# Patient Record
Sex: Female | Born: 1995 | Race: Black or African American | Hispanic: No | Marital: Single | Smoking: Current every day smoker
Health system: Southern US, Community
[De-identification: ages and names within clinical notes are randomized; demographics above are authoritative.]

## PROBLEM LIST (undated history)

## (undated) ENCOUNTER — Inpatient Hospital Stay (HOSPITAL_COMMUNITY): Payer: Self-pay

## (undated) DIAGNOSIS — M1990 Unspecified osteoarthritis, unspecified site: Secondary | ICD-10-CM

## (undated) DIAGNOSIS — F4310 Post-traumatic stress disorder, unspecified: Secondary | ICD-10-CM

## (undated) DIAGNOSIS — F329 Major depressive disorder, single episode, unspecified: Secondary | ICD-10-CM

## (undated) DIAGNOSIS — F209 Schizophrenia, unspecified: Secondary | ICD-10-CM

## (undated) DIAGNOSIS — R1115 Cyclical vomiting syndrome unrelated to migraine: Secondary | ICD-10-CM

## (undated) DIAGNOSIS — R519 Headache, unspecified: Secondary | ICD-10-CM

## (undated) DIAGNOSIS — J069 Acute upper respiratory infection, unspecified: Secondary | ICD-10-CM

## (undated) DIAGNOSIS — F3110 Bipolar disorder, current episode manic without psychotic features, unspecified: Secondary | ICD-10-CM

## (undated) DIAGNOSIS — R0600 Dyspnea, unspecified: Secondary | ICD-10-CM

## (undated) DIAGNOSIS — J45909 Unspecified asthma, uncomplicated: Secondary | ICD-10-CM

## (undated) DIAGNOSIS — F419 Anxiety disorder, unspecified: Secondary | ICD-10-CM

## (undated) DIAGNOSIS — F32A Depression, unspecified: Secondary | ICD-10-CM

## (undated) HISTORY — PX: TONSILLECTOMY: SUR1361

## (undated) HISTORY — DX: Unspecified osteoarthritis, unspecified site: M19.90

---
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
## 2013-01-08 ENCOUNTER — Emergency Department (HOSPITAL_COMMUNITY): Payer: BC Managed Care – PPO

## 2013-01-08 ENCOUNTER — Emergency Department (HOSPITAL_COMMUNITY)
Admission: EM | Admit: 2013-01-08 | Discharge: 2013-01-08 | Disposition: A | Discharge status: Home / Self Care | Payer: BC Managed Care – PPO | Attending: Emergency Medicine | Admitting: Emergency Medicine

## 2013-01-08 ENCOUNTER — Encounter (HOSPITAL_COMMUNITY): Payer: Self-pay | Admitting: Emergency Medicine

## 2013-01-08 DIAGNOSIS — Z3202 Encounter for pregnancy test, result negative: Secondary | ICD-10-CM | POA: Insufficient documentation

## 2013-01-08 DIAGNOSIS — B9789 Other viral agents as the cause of diseases classified elsewhere: Secondary | ICD-10-CM | POA: Insufficient documentation

## 2013-01-08 DIAGNOSIS — Z79899 Other long term (current) drug therapy: Secondary | ICD-10-CM | POA: Insufficient documentation

## 2013-01-08 DIAGNOSIS — R109 Unspecified abdominal pain: Secondary | ICD-10-CM | POA: Insufficient documentation

## 2013-01-08 DIAGNOSIS — M545 Low back pain, unspecified: Secondary | ICD-10-CM | POA: Insufficient documentation

## 2013-01-08 DIAGNOSIS — B349 Viral infection, unspecified: Secondary | ICD-10-CM

## 2013-01-08 DIAGNOSIS — R112 Nausea with vomiting, unspecified: Secondary | ICD-10-CM | POA: Insufficient documentation

## 2013-01-08 DIAGNOSIS — R6883 Chills (without fever): Secondary | ICD-10-CM | POA: Insufficient documentation

## 2013-01-08 LAB — COMPREHENSIVE METABOLIC PANEL
Alkaline Phosphatase: 52 U/L (ref 39–117)
BUN: 14 mg/dL (ref 6–23)
Calcium: 10.8 mg/dL — ABNORMAL HIGH (ref 8.4–10.5)
GFR calc Af Amer: >90 mL/min (ref >90)
GFR calc non Af Amer: >90 mL/min (ref >90)
Glucose, Bld: 97 mg/dL (ref 70–99)
Total Protein: 8.1 g/dL (ref 6.0–8.3)

## 2013-01-08 LAB — URINE MICROSCOPIC-ADD ON

## 2013-01-08 LAB — URINALYSIS, ROUTINE W REFLEX MICROSCOPIC
Glucose, UA: NEGATIVE mg/dL
Protein, ur: 100 mg/dL — AB
Specific Gravity, Urine: 1.041 — ABNORMAL HIGH (ref 1.005–1.030)
Urobilinogen, UA: 1 mg/dL (ref 0.0–1.0)
pH: 6 (ref 5.0–8.0)

## 2013-01-08 LAB — CBC WITH DIFFERENTIAL/PLATELET
Basophils Absolute: 0 10*3/uL (ref 0.0–0.1)
Hemoglobin: 16.3 g/dL — ABNORMAL HIGH (ref 12.0–15.0)
Lymphocytes Relative: 15 % (ref 12–46)
Lymphs Abs: 1.6 10*3/uL (ref 0.7–4.0)
MCHC: 35.1 g/dL (ref 30.0–36.0)
Neutrophils Relative %: 82 % — ABNORMAL HIGH (ref 43–77)
Platelets: 248 10*3/uL (ref 150–400)
RDW: 12.1 % (ref 11.5–15.5)

## 2013-01-08 LAB — LIPASE, BLOOD: Lipase: 15 U/L (ref 11–59)

## 2013-01-08 MED ORDER — PROMETHAZINE HCL 25 MG PO TABS: 25.0000 mg | ORAL_TABLET | Freq: Four times a day (QID) | ORAL | Status: DC | PRN

## 2013-01-08 MED ORDER — ONDANSETRON HCL 4 MG/2ML IJ SOLN
4.0000 mg | Freq: Once | INTRAMUSCULAR | Status: AC
  Administered 2013-01-08: 4 mg via INTRAVENOUS
  Filled 2013-01-08: qty 2

## 2013-01-08 MED ORDER — SODIUM CHLORIDE 0.9 % IV BOLUS (SEPSIS)
1000.0000 mL | Freq: Once | INTRAVENOUS | Status: AC
  Administered 2013-01-08: 1000 mL via INTRAVENOUS

## 2013-01-08 MED ORDER — POTASSIUM CHLORIDE CRYS ER 20 MEQ PO TBCR
40.0000 meq | EXTENDED_RELEASE_TABLET | Freq: Once | ORAL | Status: DC
  Filled 2013-01-08: qty 2

## 2013-01-08 MED ORDER — PROMETHAZINE HCL 25 MG/ML IJ SOLN
25.0000 mg | Freq: Once | INTRAMUSCULAR | Status: AC
  Administered 2013-01-08: 25 mg via INTRAVENOUS
  Filled 2013-01-08 (×2): qty 1

## 2013-01-08 MED ORDER — ONDANSETRON 4 MG PO TBDP
8.0000 mg | ORAL_TABLET | Freq: Once | ORAL | Status: AC
  Administered 2013-01-08: 8 mg via ORAL
  Filled 2013-01-08: qty 2

## 2013-01-08 MED ORDER — SODIUM CHLORIDE 0.9 % IV BOLUS (SEPSIS): 1000.0000 mL | Freq: Once | INTRAVENOUS | Status: DC

## 2013-01-08 NOTE — ED Notes (Signed)
Patient transported to CT 

## 2013-01-08 NOTE — ED Notes (Signed)
Pt c/o N/V since 0300 on Sunday am; pt admits to ETOH earlier in night on Saturday; pt sts vaginal spotting at present

## 2013-01-08 NOTE — ED Provider Notes (Signed)
History     CSN: [id number]  Arrival date & time 01/08/13  4098   First MD Initiated Contact with Patient 01/08/13 1124      Chief Complaint  Patient presents with  . Nausea  . Emesis    (Consider location/radiation/quality/duration/timing/severity/associated sxs/prior treatment) The history is provided by the patient and a parent. No language interpreter was used.  Angela Christian is an 17 y/o F presenting to the ED with nausea and emesis that has been ongoing since yesterday morning, around 4:00AM. Mother reported that patient was fine on Saturday - stated that around 4:00AM Sunday morning patient began to have emesis, stated more than two times - mainly of liquid consistency - negative NB/NB. Mother reported that every time patient drinks or eats something the food and liquid come right back up. Patient reported that has been having abdominal pain - mainly of a tightness sensation that has been ongoing for the past day, with mild radiation to the lower back. Stated that she tried taking peptobismol without success - reported that she vomited shortly after. Patient reported that she has been feeling nausea continuously since the early morning yesterday. Mother reported that patient ate [employer] on Saturday and was at a friend's cook out where for had some ETOH, a sip of wine. Associated symptoms are chills. Denied diarrhea, fever, numbness, tingling, headache, dizziness, visual disturbances, urinary symptoms.   History reviewed. No pertinent past medical history.  History reviewed. No pertinent past surgical history.  History reviewed. No pertinent family history.  History  Substance Use Topics  . Smoking status: Never Smoker   . Smokeless tobacco: Not on file  . Alcohol Use: Yes     Comment: occ    OB History   Grav Para Term Preterm Abortions TAB SAB Ect Mult Living                  Review of Systems  Constitutional: Positive for chills. Negative for fever.  HENT:  Negative for neck pain and neck stiffness.   Eyes: Negative for pain and visual disturbance.  Respiratory: Negative for cough, chest tightness and shortness of breath.   Cardiovascular: Negative for chest pain.  Gastrointestinal: Positive for nausea, vomiting and abdominal pain. Negative for diarrhea, constipation, blood in stool and anal bleeding.  Genitourinary: Negative for vaginal bleeding (spotting, baseline for patient since on Depo-provera), vaginal discharge and vaginal pain.  Musculoskeletal: Positive for back pain. Negative for arthralgias.  Neurological: Negative for dizziness, weakness, light-headedness, numbness and headaches.  All other systems reviewed and are negative.    Allergies  Review of patient's allergies indicates no known allergies.  Home Medications   Current Outpatient Rx  Name  Route  Sig  Dispense  Refill  . bismuth subsalicylate (PEPTO BISMOL) 262 MG/15ML suspension   Oral   Take 30 mLs by mouth every 6 (six) hours as needed for indigestion.         . medroxyPROGESTERone (DEPO-PROVERA) 150 MG/ML injection   Intramuscular   Inject 150 mg into the muscle every 3 (three) months.         . promethazine (PHENERGAN) 25 MG tablet   Oral   Take 1 tablet (25 mg total) by mouth every 6 (six) hours as needed for nausea.   30 tablet   0     BP 123/92  Pulse 99  Temp(Src) 98.3 F (36.8 C) (Oral)  Resp 20  SpO2 100%  LMP 01/08/2013  Physical Exam  Nursing note  and vitals reviewed. Constitutional: She is oriented to person, place, and time. She appears well-developed and well-nourished. No distress.  HENT:  Head: Normocephalic and atraumatic.  Mucus membranes appear dry  Eyes: Conjunctivae and EOM are normal. Pupils are equal, round, and reactive to light. Right eye exhibits no discharge. Left eye exhibits no discharge.  Neck: Normal range of motion. Neck supple.  Cardiovascular: Normal rate, regular rhythm and normal heart sounds.  Exam reveals  no friction rub.   No murmur heard. Pulses:      Radial pulses are 2+ on the right side, and 2+ on the left side.       Dorsalis pedis pulses are 2+ on the right side, and 2+ on the left side.  Cap refill < 3 seconds  Pulmonary/Chest: Effort normal and breath sounds normal. No respiratory distress. She has no wheezes. She has no rales.  Abdominal: Soft. Normal appearance and bowel sounds are normal. She exhibits no distension. There is no hepatosplenomegaly. There is no tenderness. There is no rigidity, no rebound, no guarding, no tenderness at McBurney's point and negative Murphy's sign. No hernia.  Negative Murphy's sign Negative obtruator Negative psoas  Musculoskeletal: Normal range of motion.  Lymphadenopathy:    She has no cervical adenopathy.  Neurological: She is alert and oriented to person, place, and time. No cranial nerve deficit. She exhibits normal muscle tone. Coordination normal.  Cranial nerves III-XII grossly intact  Skin: Skin is warm and dry. No rash noted. She is not diaphoretic. No erythema.  Psychiatric: She has a normal mood and affect. Her behavior is normal. Thought content normal.    ED Course  Procedures (including critical care time)  3:30PM PO challenge with fluid to be trialed.  4:35PM Patient re-assessed. Stated that she is doing well and feels better. Tolerated PO fluid challenge. Patient ready for discharge.  4:43PM Patient vomited - mainly of clear fluids, stomach contents.   6:29PM Nurse reported that patient continues to feel nausea - Reglan IV 25 mg ordered.   7:10PM Discussed findings with patient regarding CT scan - negative findings. Patient reported that she is feeling better, stated that she is tired and ready to go home. Patient stated that nausea is controlled and that she is feeling better.   Medications  sodium chloride 0.9 % bolus 1,000 mL (not administered)  ondansetron (ZOFRAN-ODT) disintegrating tablet 8 mg (8 mg Oral Given 01/08/13  1026)  sodium chloride 0.9 % bolus 1,000 mL (0 mLs Intravenous Stopped 01/08/13 1520)  ondansetron (ZOFRAN) injection 4 mg (4 mg Intravenous Given 01/08/13 1239)  ondansetron (ZOFRAN) injection 4 mg (4 mg Intravenous Given 01/08/13 1400)  sodium chloride 0.9 % bolus 1,000 mL (0 mLs Intravenous Stopped 01/08/13 1924)  ondansetron (ZOFRAN) injection 4 mg (4 mg Intravenous Given 01/08/13 1731)  promethazine (PHENERGAN) injection 25 mg (25 mg Intravenous Given 01/08/13 1846)    Labs Reviewed  URINALYSIS, ROUTINE W REFLEX MICROSCOPIC - Abnormal; Notable for the following:    Color, Urine AMBER (*)    APPearance HAZY (*)    Specific Gravity, Urine 1.041 (*)    Hgb urine dipstick MODERATE (*)    Bilirubin Urine SMALL (*)    Ketones, ur 40 (*)    Protein, ur 100 (*)    Leukocytes, UA TRACE (*)    All other components within normal limits  CBC WITH DIFFERENTIAL - Abnormal; Notable for the following:    WBC 10.7 (*)    Hemoglobin 16.3 (*)  HCT 46.4 (*)    Neutrophils Relative % 82 (*)    Neutro Abs 8.7 (*)    All other components within normal limits  COMPREHENSIVE METABOLIC PANEL - Abnormal; Notable for the following:    Potassium 3.4 (*)    Calcium 10.8 (*)    All other components within normal limits  URINE MICROSCOPIC-ADD ON - Abnormal; Notable for the following:    Crystals CA OXALATE CRYSTALS (*)    All other components within normal limits  LIPASE, BLOOD  POCT PREGNANCY, URINE   Ct Abdomen Pelvis Wo Contrast  01/08/2013   *RADIOLOGY REPORT*  Clinical Data: Nausea, vomiting, vaginal spotting  CT ABDOMEN AND PELVIS WITHOUT CONTRAST  Technique:  Multidetector CT imaging of the abdomen and pelvis was performed following the standard protocol without intravenous contrast.  Comparison: Abdomen films from today  Findings: The lung bases are clear.  The liver is unremarkable in the unenhanced state.  No definite gallstones are noted.  The pancreas is difficult to assess on this unenhanced  study but no definite mass or peripancreatic fluid is seen.  The adrenal glands and spleen are unremarkable.  The stomach is not well distended. No definite renal calculi are seen and there is no evidence of hydronephrosis.  The abdominal aorta is normal in caliber.  The uterus is slightly anteverted.  Probable small ovarian follicles are present bilaterally, and no significant free fluid is evident on this unenhanced study.  The appendix fills with air.  No inflammatory process is seen.  No bony abnormality is noted.  IMPRESSION: No significant abnormality on this unenhanced CT of the abdomen and pelvis.   Original Report Authenticated By: Dwyane Dee, M.D.   Dg Abd Acute W/chest  01/08/2013   *RADIOLOGY REPORT*  Clinical Data: Abdominal pain.  Nausea and vomiting.  ACUTE ABDOMEN SERIES (ABDOMEN 2 VIEW & CHEST 1 VIEW)  Comparison: None.  Findings: Bowel gas pattern is normal without evidence of ileus, obstruction nor free air.  No worrisome calcifications or bony findings.  One-view chest shows normal heart and mediastinal shadows.  Lungs are clear.  No effusions.  IMPRESSION: Normal acute abdominal series   Original Report Authenticated By: Paulina Fusi, M.D.     1. Nausea and vomiting   2. Viral illness       MDM  Patient presenting with nausea and emesis that has been ongoing for the past 2 days. Negative abdominal pain.  PCP Dr. Kevan Ny [facility].  Negative urine pregnancy. Negative UA findings - patient dehydrated, calcium oxalate crystals - IV NS fluids given. CBC and CMP negative findings - mildly low potassium (3.4). Lipase negative. Negative findings on abdomen and chest xray - negative obstruction or ileus noted - negative free air. CT scan of abdomen to rule out kidney stones - hematuria and possible renal colic. CT abdomen - negative gallstones noted, negative peripancreatic fluid noted, negative evidence of nephrolithiasis/renal calculi, negative hydronephrosis noted. Patient  stable, afebrile. Discussed case with Dr. Leeann Must - patient cleared for discharge. Suspicion to be viral in nature, definitive etiology unknown. Patient discharged with antiemetics. Referred patient to PCP, Dr. Kevan Ny, and gastroenterologist. Discussed with patient diet and to rest and stay hydrated. Discussed with patient to monitor symptoms and if symptoms are to worsen or change to report back to the ED - strict return instructions given - discussed that if patient continues to have nausea and emesis by Thursday of this week to return back to the ED.  Patient agreed  to plan of care, understood, all questions answered.             Raymon Mutton, PA-C 01/08/13 2130

## 2013-01-10 NOTE — ED Provider Notes (Signed)
Medical screening examination/treatment/procedure(s) were conducted as a shared visit with non-physician practitioner(s) and myself.  I personally evaluated the patient during the encounter Pt c/o nv. Emesis not bloody or bilious. No abd tenderness on exam. No fevers. No headache. Iv fluids, labs, zofran.   Suzi Roots, MD 01/10/13 1556

## 2013-01-12 ENCOUNTER — Observation Stay (HOSPITAL_COMMUNITY)
Admission: EM | Admit: 2013-01-12 | Discharge: 2013-01-14 | Disposition: A | Discharge status: Home / Self Care | Payer: BC Managed Care – PPO | Attending: Internal Medicine | Admitting: Internal Medicine

## 2013-01-12 ENCOUNTER — Encounter (HOSPITAL_COMMUNITY): Payer: Self-pay | Admitting: *Deleted

## 2013-01-12 DIAGNOSIS — R1115 Cyclical vomiting syndrome unrelated to migraine: Principal | ICD-10-CM | POA: Insufficient documentation

## 2013-01-12 DIAGNOSIS — F1210 Cannabis abuse, uncomplicated: Secondary | ICD-10-CM | POA: Insufficient documentation

## 2013-01-12 DIAGNOSIS — R112 Nausea with vomiting, unspecified: Secondary | ICD-10-CM | POA: Diagnosis present

## 2013-01-12 DIAGNOSIS — E860 Dehydration: Secondary | ICD-10-CM

## 2013-01-12 DIAGNOSIS — E43 Unspecified severe protein-calorie malnutrition: Secondary | ICD-10-CM | POA: Insufficient documentation

## 2013-01-12 LAB — COMPREHENSIVE METABOLIC PANEL
ALT: 13 U/L (ref 0–35)
AST: 25 U/L (ref 0–37)
Albumin: 3.9 g/dL (ref 3.5–5.2)
Calcium: 8.9 mg/dL (ref 8.4–10.5)
Creatinine, Ser: 0.84 mg/dL (ref 0.50–1.10)
Sodium: 140 mEq/L (ref 135–145)
Total Protein: 6.8 g/dL (ref 6.0–8.3)

## 2013-01-12 LAB — URINALYSIS, ROUTINE W REFLEX MICROSCOPIC
Glucose, UA: NEGATIVE mg/dL
Leukocytes, UA: NEGATIVE
Protein, ur: NEGATIVE mg/dL
Specific Gravity, Urine: 1.029 (ref 1.005–1.030)

## 2013-01-12 LAB — CBC WITH DIFFERENTIAL/PLATELET
Basophils Absolute: 0 10*3/uL (ref 0.0–0.1)
Basophils Relative: 0 % (ref 0–1)
Eosinophils Absolute: 0 10*3/uL (ref 0.0–0.7)
Eosinophils Relative: 0 % (ref 0–5)
Lymphocytes Relative: 9 % — ABNORMAL LOW (ref 12–46)
MCH: 33.3 pg (ref 26.0–34.0)
MCHC: 36 g/dL (ref 30.0–36.0)
MCV: 92.5 fL (ref 78.0–100.0)
Monocytes Absolute: 0.2 10*3/uL (ref 0.1–1.0)
Platelets: 230 10*3/uL (ref 150–400)
RDW: 11.6 % (ref 11.5–15.5)
WBC: 8.4 10*3/uL (ref 4.0–10.5)

## 2013-01-12 LAB — WET PREP, GENITAL
Clue Cells Wet Prep HPF POC: NONE SEEN
Trich, Wet Prep: NONE SEEN
WBC, Wet Prep HPF POC: NONE SEEN

## 2013-01-12 LAB — URINE MICROSCOPIC-ADD ON

## 2013-01-12 LAB — CG4 I-STAT (LACTIC ACID): Lactic Acid, Venous: 1.18 mmol/L (ref 0.5–2.2)

## 2013-01-12 MED ORDER — MORPHINE SULFATE 4 MG/ML IJ SOLN
4.0000 mg | Freq: Once | INTRAMUSCULAR | Status: AC
  Administered 2013-01-12: 4 mg via INTRAVENOUS
  Filled 2013-01-12: qty 1

## 2013-01-12 MED ORDER — ONDANSETRON HCL 4 MG/2ML IJ SOLN
4.0000 mg | Freq: Once | INTRAMUSCULAR | Status: AC
  Administered 2013-01-12: 4 mg via INTRAVENOUS
  Filled 2013-01-12: qty 2

## 2013-01-12 MED ORDER — DEXTROSE-NACL 5-0.9 % IV SOLN
Freq: Once | INTRAVENOUS | Status: AC
  Administered 2013-01-12: via INTRAVENOUS

## 2013-01-12 MED ORDER — SODIUM CHLORIDE 0.9 % IV BOLUS (SEPSIS)
1000.0000 mL | Freq: Once | INTRAVENOUS | Status: AC
Start: 2013-01-12 — End: 2013-01-12
  Administered 2013-01-12: 1000 mL via INTRAVENOUS

## 2013-01-12 NOTE — ED Provider Notes (Signed)
History    CSN: [id number] Arrival date & time 01/12/13  1629  First MD Initiated Contact with Patient 01/12/13 1819     Chief Complaint  Patient presents with  . Emesis   (Consider location/radiation/quality/duration/timing/severity/associated sxs/prior Treatment) HPI Comments: Patient is an 17 year old female who presents with ongoing abdominal pain, nausea, vomiting. She was seen in the emergency department 4 days ago for the same. At that time she was worked up with labs, x-ray, CT. There is been was unremarkable at that time. Her mother reports that she gradually got better each day and was able to eat some rice yesterday. This morning she woke up with worsening nausea and vomiting. She has not been able to tolerate any liquids or solids today. She complains of pain in her suprapubic area with radiation to her right and left lower quadrant. She has not had a regular bowel movement in the past week, but has not been eating much. She denies any urinary urgency, painful urination, hematuria. She is currently on her period. Her menstrual cycle is irregular as she is on Depo. She denies vaginal discharge, new sexual partners.  The history is provided by the patient. No language interpreter was used.   History reviewed. No pertinent past medical history. History reviewed. No pertinent past surgical history. History reviewed. No pertinent family history. History  Substance Use Topics  . Smoking status: Never Smoker   . Smokeless tobacco: Not on file  . Alcohol Use: Yes     Comment: occ   OB History   Grav Para Term Preterm Abortions TAB SAB Ect Mult Living                 Review of Systems  Constitutional: Negative for fever and chills.  Respiratory: Negative for shortness of breath.   Gastrointestinal: Positive for nausea, vomiting and abdominal pain. Negative for diarrhea and constipation.  Genitourinary: Positive for vaginal bleeding. Negative for dysuria, urgency, frequency,  vaginal discharge and vaginal pain.  All other systems reviewed and are negative.    Allergies  Review of patient's allergies indicates no known allergies.  Home Medications   Current Outpatient Rx  Name  Route  Sig  Dispense  Refill  . bismuth subsalicylate (PEPTO BISMOL) 262 MG/15ML suspension   Oral   Take 30 mLs by mouth every 6 (six) hours as needed for indigestion.         . medroxyPROGESTERone (DEPO-PROVERA) 150 MG/ML injection   Intramuscular   Inject 150 mg into the muscle every 3 (three) months.         . promethazine (PHENERGAN) 25 MG tablet   Oral   Take 1 tablet (25 mg total) by mouth every 6 (six) hours as needed for nausea.   30 tablet   0    BP 118/87  Pulse 82  Temp(Src) 99.3 F (37.4 C) (Oral)  Resp 16  SpO2 99%  LMP 01/08/2013 Physical Exam  Nursing note and vitals reviewed. Constitutional: She is oriented to person, place, and time. She appears well-developed and well-nourished. No distress.  HENT:  Head: Normocephalic and atraumatic.  Right Ear: External ear normal.  Left Ear: External ear normal.  Nose: Nose normal.  Mouth/Throat: Oropharynx is clear and moist.  Eyes: Conjunctivae are normal.  Neck: Normal range of motion.  Cardiovascular: Normal rate, regular rhythm and normal heart sounds.   Pulmonary/Chest: Effort normal and breath sounds normal. No stridor. No respiratory distress. She has no wheezes. She has no rales.  Abdominal: Soft. She exhibits no distension. There is tenderness in the suprapubic area. There is no rigidity, no rebound, no guarding, no tenderness at McBurney's point and negative Murphy's sign. Hernia confirmed negative in the right inguinal area and confirmed negative in the left inguinal area.  Genitourinary: Pelvic exam was performed with patient supine. Cervix exhibits discharge (blood). Cervix exhibits no motion tenderness and no friability. Right adnexum displays no mass, no tenderness and no fullness. Left  adnexum displays no mass, no tenderness and no fullness. No erythema or tenderness around the vagina. No foreign body around the vagina. No signs of injury around the vagina.  Musculoskeletal: Normal range of motion.  Lymphadenopathy:       Right: No inguinal adenopathy present.       Left: No inguinal adenopathy present.  Neurological: She is alert and oriented to person, place, and time. She has normal strength.  Skin: Skin is warm and dry. She is not diaphoretic. No erythema.  Psychiatric: She has a normal mood and affect. Her behavior is normal.    ED Course  Procedures (including critical care time) Labs Reviewed  CBC WITH DIFFERENTIAL - Abnormal; Notable for the following:    Neutrophils Relative % 89 (*)    Lymphocytes Relative 9 (*)    Monocytes Relative 2 (*)    All other components within normal limits  URINALYSIS, ROUTINE W REFLEX MICROSCOPIC - Abnormal; Notable for the following:    Hgb urine dipstick LARGE (*)    Bilirubin Urine SMALL (*)    Ketones, ur >80 (*)    All other components within normal limits  WET PREP, GENITAL  GC/CHLAMYDIA PROBE AMP  COMPREHENSIVE METABOLIC PANEL  LIPASE, BLOOD  URINE MICROSCOPIC-ADD ON  URINE RAPID DRUG SCREEN (HOSP PERFORMED)  CG4 I-STAT (LACTIC ACID)    No results found.   1. Dehydration   2. Nausea & vomiting     MDM  Patient with continued dehydration, nausea, and vomiting. Abdomen soft, nonsurgical. Discussed case with Dr. Julian Reil who agrees to admit. Added UDS and PRN nausea medications.   Mora Bellman, PA-C 01/13/13 0221

## 2013-01-12 NOTE — ED Notes (Signed)
Pt in via EMS, pt in c/o vomiting x5 days, also abd pain, seen here a few days ago, given phenergan to go home with, no relief. Vitals WNL.

## 2013-01-13 ENCOUNTER — Observation Stay (HOSPITAL_COMMUNITY): Payer: BC Managed Care – PPO

## 2013-01-13 ENCOUNTER — Encounter (HOSPITAL_COMMUNITY): Payer: Self-pay | Admitting: General Practice

## 2013-01-13 DIAGNOSIS — R112 Nausea with vomiting, unspecified: Secondary | ICD-10-CM | POA: Diagnosis present

## 2013-01-13 DIAGNOSIS — E860 Dehydration: Secondary | ICD-10-CM

## 2013-01-13 MED ORDER — BOOST / RESOURCE BREEZE PO LIQD: 1.0000 | Freq: Three times a day (TID) | ORAL | Status: DC

## 2013-01-13 MED ORDER — BISMUTH SUBSALICYLATE 262 MG/15ML PO SUSP
30.0000 mL | Freq: Four times a day (QID) | ORAL | Status: DC | PRN
  Administered 2013-01-13 – 2013-01-14 (×2): 30 mL via ORAL
  Filled 2013-01-13: qty 236

## 2013-01-13 MED ORDER — ENSURE COMPLETE PO LIQD
237.0000 mL | Freq: Three times a day (TID) | ORAL | Status: DC
  Administered 2013-01-13 – 2013-01-14 (×3): 237 mL via ORAL

## 2013-01-13 MED ORDER — DEXTROSE-NACL 5-0.9 % IV SOLN
Freq: Once | INTRAVENOUS | Status: AC
  Administered 2013-01-13: 05:00:00 via INTRAVENOUS

## 2013-01-13 MED ORDER — PROMETHAZINE HCL 25 MG PO TABS: 25.0000 mg | ORAL_TABLET | Freq: Four times a day (QID) | ORAL | Status: DC | PRN

## 2013-01-13 MED ORDER — ONDANSETRON HCL 4 MG/2ML IJ SOLN
4.0000 mg | Freq: Every day | INTRAMUSCULAR | Status: DC | PRN
  Administered 2013-01-13: 4 mg via INTRAVENOUS
  Filled 2013-01-13: qty 2

## 2013-01-13 MED ORDER — SODIUM CHLORIDE 0.9 % IV SOLN
INTRAVENOUS | Status: DC
  Administered 2013-01-13: 23:00:00 via INTRAVENOUS

## 2013-01-13 NOTE — Care Management Utilization Note (Signed)
Utilization review completed.  

## 2013-01-13 NOTE — Progress Notes (Addendum)
[facility] PROGRESS NOTE  Angela Christian ZOX:[id number] DOB: [birth year] DOA: 01/12/2013 PCP: Hollice Espy, MD I have seen and examined pt who is an 17 yo admitted this am by Dr Julian Reil with  persistent nausea and vomiting for about 1wk. She admits to marijuana use. Pt tolerating clears, advance as tol. Continue current management plan as per Dr Julian Reil and follow. Pt counseled to quit marijuana, soc cons for resources to quit     [facility] C  [facility] Pager [phone]. If 7PM-7AM, please contact night-coverage at [email or web address], password [facility] 01/13/2013, 2:12 PM  LOS: 1 day

## 2013-01-13 NOTE — H&P (Signed)
[facility] History and Physical  Angela Christian:[id number] DOB: 14-Feb-1996 DOA: 01/12/2013  Referring physician: ED PCP: Hollice Espy, MD   Chief Complaint: N/V  HPI: Angela Christian is a 17 y.o. female previously healthy who presents to the ED with ongoing nausea and vomiting since Sunday.  Patient had been seen in the ED for the same on 6/23 and discharged on phenergan.  Her symptoms had been improving until today when she woke up with them severely worse.  No sick contacts and no one around her has become sick.  Work up in the ED was unremarkable as it was on the 23rd, hospitalist asked to admit for symptom management.  Review of Systems: No fever, no chills, no diarrhea (actually states she hasnt had a BM except once this week), no abdominal pain, 12 systems reviewed and otherwise negative.  History reviewed. No pertinent past medical history. History reviewed. No pertinent past surgical history. Social History:  reports that she has never smoked. She does not have any smokeless tobacco history on file. She reports that  drinks alcohol. She reports that she uses illicit drugs. Uses Mariajuana daily.  No Known Allergies  History reviewed. No pertinent family history.  Prior to Admission medications   Medication Sig Start Date End Date Taking? Authorizing Provider  bismuth subsalicylate (PEPTO BISMOL) 262 MG/15ML suspension Take 30 mLs by mouth every 6 (six) hours as needed for indigestion.   Yes Historical Provider, MD  medroxyPROGESTERone (DEPO-PROVERA) 150 MG/ML injection Inject 150 mg into the muscle every 3 (three) months.   Yes Historical Provider, MD  promethazine (PHENERGAN) 25 MG tablet Take 1 tablet (25 mg total) by mouth every 6 (six) hours as needed for nausea. 01/08/13  Yes Marissa Sciacca, PA-C   Physical Exam: Filed Vitals:   01/12/13 2200 01/12/13 2219 01/13/13 0300 01/13/13 0425  BP: 98/52 98/52 106/49 100/56  Pulse: 74 75 60 60  Temp:   97.9 F  (36.6 C) 97.6 F (36.4 C)  TempSrc:   Oral Oral  Resp:  18 18 18   Height:   5' 4.2" (1.631 m)   Weight:   49.3 kg (108 lb 11 oz)   SpO2: 100% 98% 100% 99%    General:  NAD, resting comfortably in bed Eyes: PEERLA EOMI ENT: mucous membranes moist Neck: supple w/o JVD Cardiovascular: RRR w/o MRG Respiratory: CTA B Abdomen: soft, nt, nd, bs+ Skin: no rash nor lesion Musculoskeletal: MAE, full ROM all 4 extremities Psychiatric: normal tone and affect Neurologic: AAOx3, grossly non-focal  Labs on Admission:  Basic Metabolic Panel:  Recent Labs Lab 01/08/13 1035 01/12/13 2140  NA 141 140  K 3.4* 4.2  CL 105 106  CO2 21 21  GLUCOSE 97 83  BUN 14 14  CREATININE 0.90 0.84  CALCIUM 10.8* 8.9   Liver Function Tests:  Recent Labs Lab 01/08/13 1035 01/12/13 2140  AST 22 25  ALT 17 13  ALKPHOS 52 41  BILITOT 1.1 1.1  PROT 8.1 6.8  ALBUMIN 4.7 3.9    Recent Labs Lab 01/08/13 1035 01/12/13 2140  LIPASE 15 21   No results found for this basename: AMMONIA,  in the last 168 hours CBC:  Recent Labs Lab 01/08/13 1035 01/12/13 2140  WBC 10.7* 8.4  NEUTROABS 8.7* 7.5  HGB 16.3* 15.0  HCT 46.4* 41.7  MCV 94.5 92.5  PLT 248 230   Cardiac Enzymes: No results found for this basename: CKTOTAL, CKMB, CKMBINDEX, TROPONINI,  in the  last 168 hours  BNP (last 3 results) No results found for this basename: PROBNP,  in the last 8760 hours CBG: No results found for this basename: GLUCAP,  in the last 168 hours  Radiological Exams on Admission: No results found.  EKG: Independently reviewed.  Assessment/Plan Principal Problem:   Nausea and vomiting   1. N/V - ddx includes: 1. CVS - would be the first time this occurred but am suspicious given the fairly consistent use of cannabis by the patient.  As discussed with patient certainly cant make this diagnosis from just one episode but if her symptoms were to return in future then this should be  suspected. 2. Infectious - possible, but odd that the patient has been ill for nearly a week now with symptoms, no one else around her has been or has gotten sick despite close contact, and also unusual to see such severe N/V for such a duration without any diarrhea. 3. Obstruction - will check KUB but highly unlikely in a previously healthy 17 year old who has never had abdominal surgery.    Code Status: Full Code (must indicate code status--if unknown or must be presumed, indicate so) Family Communication: Mother is at bedside (indicate person spoken with, if applicable, with phone number if by telephone) Disposition Plan: Admit to inpatient (indicate anticipated LOS)  Time spent: 50 min  GARDNER, JARED M. [facility] Pager [phone]  If 7PM-7AM, please contact night-coverage [email or web address] Password [facility] 01/13/2013, 4:54 AM

## 2013-01-13 NOTE — ED Provider Notes (Signed)
Medical screening examination/treatment/procedure(s) were conducted as a shared visit with non-physician practitioner(s) and myself.  I personally evaluated the patient during the encounter Pt with mild suprapubic pain but continued vomiting.  Mild improvement but recent neg CT and pelvic neg.  UA with large ketones.  Gwyneth Sprout, MD 01/13/13 2317

## 2013-01-13 NOTE — Progress Notes (Addendum)
INITIAL NUTRITION ASSESSMENT  DOCUMENTATION CODES Per approved criteria  -Severe malnutrition in the context of acute illness or injury   INTERVENTION: Ensure Complete PO TID, each supplement provides 350 kcal and 13 grams of protein.  NUTRITION DIAGNOSIS: Malnutrition related to altered GI function with inadequate oral intake as evidenced by 5% weight loss in the past week and intake </= 50% of estimated energy needs for >/= 5 days.  Goal: Intake to meet >90% of estimated nutrition needs.  Monitor:  Diet advancement, PO intake, labs, weight trend.  Reason for Assessment: MST=2  17 y.o. female  Admitting Dx: Nausea and vomiting  ASSESSMENT: Patient admitted with a 5 day history of N/V. Says she has been eating minimal amounts since last Sunday. Is currently drinking tea in room. Requesting oatmeal. Does not like broth. Diet just advanced to regular; patient does not like Breeze, but does like Ensure.  Pt meets criteria for severe MALNUTRITION in the context of acute illness as evidenced by 5% weight loss in the past week and intake </= 50% of estimated energy needs for >/= 5 days.   Height: Ht Readings from Last 1 Encounters:  01/13/13 5' 4.2" (1.631 m) (49%*, Z = -0.02)   * Growth percentiles are based on CDC 2-20 Years data.    Weight: Wt Readings from Last 1 Encounters:  01/13/13 108 lb 11 oz (49.3 kg) (15%*, Z = -1.05)   * Growth percentiles are based on CDC 2-20 Years data.    Ideal Body Weight: 55.7 kg  % Ideal Body Weight: 89%  Wt Readings from Last 10 Encounters:  01/13/13 108 lb 11 oz (49.3 kg) (15%*, Z = -1.05)   * Growth percentiles are based on CDC 2-20 Years data.    Usual Body Weight: 114 lb  % Usual Body Weight: 95%  BMI:  Body mass index is 18.53 kg/(m^2).  Estimated Nutritional Needs: Kcal: 2000 Protein: 80-100 gm Fluid: 2 L  Skin: no problems  Diet Order: General  EDUCATION NEEDS: -No education needs identified at this  time   Intake/Output Summary (Last 24 hours) at 01/13/13 1442 Last data filed at 01/13/13 1300  Gross per 24 hour  Intake    720 ml  Output      0 ml  Net    720 ml    Last BM: 6/22 (PTA)   Labs:   Recent Labs Lab 01/08/13 1035 01/12/13 2140  NA 141 140  K 3.4* 4.2  CL 105 106  CO2 21 21  BUN 14 14  CREATININE 0.90 0.84  CALCIUM 10.8* 8.9  GLUCOSE 97 83    CBG (last 3)  No results found for this basename: GLUCAP,  in the last 72 hours  Scheduled Meds: None  Continuous Infusions: None  Past Medical History: daily marijuana use  History reviewed. No pertinent past surgical history.  Joaquin Courts, RD, LDN, CNSC Pager [phone] After Hours Pager [phone]

## 2013-01-13 NOTE — Progress Notes (Signed)
Pt admitted to the unit. Mother to bedside. Pt is alert and oriented. Pt oriented to room, staff, and call bell. Bed in lowest position. Full assessment to Epic. Call bell with in reach. Told to call for assists. Will continue to monitor. Burley Saver, RN

## 2013-01-13 NOTE — Plan of Care (Signed)
Problem: Phase I Progression Outcomes Goal: Pain controlled with appropriate interventions Outcome: Progressing Continues to have some abdominal cramping and back aching intermittently. Relieved somewhat by positioning and heat.  Goal: OOB as tolerated unless otherwise ordered Outcome: Completed/Met Date Met:  01/13/13 Up in room independently. Goal: Initial discharge plan identified Outcome: Completed/Met Date Met:  01/13/13 Plan to go home with mother. Goal: Voiding-avoid urinary catheter unless indicated Outcome: Completed/Met Date Met:  01/13/13 Voiding QS. Goal: Hemodynamically stable Outcome: Completed/Met Date Met:  01/13/13 Vital signs stable. Angela Christian

## 2013-01-14 DIAGNOSIS — E43 Unspecified severe protein-calorie malnutrition: Secondary | ICD-10-CM | POA: Insufficient documentation

## 2013-01-14 LAB — BASIC METABOLIC PANEL
CO2: 25 mEq/L (ref 19–32)
Chloride: 106 mEq/L (ref 96–112)
Creatinine, Ser: 0.84 mg/dL (ref 0.50–1.10)
Sodium: 139 mEq/L (ref 135–145)

## 2013-01-14 MED ORDER — DOCUSATE SODIUM 100 MG PO CAPS
100.0000 mg | ORAL_CAPSULE | Freq: Every day | ORAL | Status: DC | PRN
  Administered 2013-01-14: 100 mg via ORAL
  Filled 2013-01-14: qty 1

## 2013-01-14 MED ORDER — OXYCODONE HCL 5 MG PO TABS
5.0000 mg | ORAL_TABLET | Freq: Once | ORAL | Status: AC
  Administered 2013-01-14: 5 mg via ORAL
  Filled 2013-01-14: qty 1

## 2013-01-14 NOTE — Progress Notes (Signed)
Stopped fluids, NSL IV per patient's request.  MD notified. Steele Berg RN

## 2013-01-14 NOTE — Discharge Summary (Signed)
Physician Discharge Summary  Angela Christian QMV:[id number] DOB: [birth year] DOA: 01/12/2013  PCP: Hollice Espy, MD  Admit date: 01/12/2013 Discharge date: 01/14/2013  Time spent: <30 minutes  Recommendations for Outpatient Follow-up:        Follow-up Information   Follow up with Hollice Espy, MD In 1 week. (call for appt upon discharge)    Contact information:   [address] [place] [address] [phone]       Discharge Diagnoses:  Principal Problem:   Nausea and vomiting Active Problems:   Protein-calorie malnutrition, severe   Discharge Condition: improved  Diet recommendation: Regular  Filed Weights   01/13/13 0300 01/13/13 2104  Weight: 49.3 kg (108 lb 11 oz) 49.4 kg (108 lb 14.5 oz)    History of present illness:  Angela Christian is a 17 y.o. female previously healthy who presents to the ED with ongoing nausea and vomiting since Sunday. [name] No sick contacts and no one around her has become sick.  Work up in the ED was unremarkable as it was on the 23rd, hospitalist asked to admit for symptom management.   Hospital Course:  Principal Problem:   Nausea and vomiting -unclear etiology, possibly related to marijuana use if this becomes recurrent/Cyclical -pt was treated supportively with IVF, antiemetics Active Problems:  Protein-calorie malnutrition, severe -was started on ensure/nutritional supplements Marijuana Abuse -Pt was counseled to quit Procedures:  none  Consultations:  none  Discharge Exam: Filed Vitals:   01/13/13 2052 01/13/13 2104 01/14/13 0608 01/14/13 1000  BP:  95/59 120/71 117/63  Pulse:  80 89 68  Temp: 97.7 F (36.5 C) 99.1 F (37.3 C) 99.1 F (37.3 C) 98.6 F (37 C)  TempSrc: Oral Oral Oral Oral  Resp: 16 18 18 18   Height:      Weight:   49.4 kg (108 lb 14.5 oz)    SpO2:  98% 99% 100%    Exam:  General: alert & oriented x 3In NAD Cardiovascular: RRR, nl S1 s2 Respiratory: CTAB Abdomen: soft +BS NT/ND, no masses palpable  Extremities: No cyanosis and no edema    Discharge Instructions  Discharge Orders   Future Orders Complete By Expires     Diet general  As directed     Discharge instructions  As directed     Comments:      Quit Marijuana    Increase activity slowly  As directed         Medication List         bismuth subsalicylate 262 MG/15ML suspension  Commonly known as:  PEPTO BISMOL  Take 30 mLs by mouth every 6 (six) hours as needed for indigestion.     medroxyPROGESTERone 150 MG/ML injection  Commonly known as:  DEPO-PROVERA  Inject 150 mg into the muscle every 3 (three) months.     promethazine 25 MG tablet  Commonly known as:  PHENERGAN  Take 1 tablet (25 mg total) by mouth every 6 (six) hours as needed for nausea.       No Known Allergies     Follow-up Information   Follow up with Hollice Espy, MD In 1 week. (call for appt upon discharge)    Contact information:   [address] [place] [address] [phone]  The results of significant diagnostics from this hospitalization (including imaging, microbiology, ancillary and laboratory) are listed below for reference.    Significant Diagnostic Studies: Ct Abdomen Pelvis Wo Contrast  01/08/2013   *RADIOLOGY REPORT*  Clinical Data: Nausea, vomiting, vaginal spotting  CT ABDOMEN AND PELVIS WITHOUT CONTRAST  Technique:  Multidetector CT imaging of the abdomen and pelvis was performed following the standard protocol without intravenous contrast.  Comparison: Abdomen films from today  Findings: The lung bases are clear.  The liver is unremarkable in the unenhanced state.  No definite gallstones are noted.  The pancreas is difficult to assess on this unenhanced study but no definite mass or peripancreatic fluid is seen.   The adrenal glands and spleen are unremarkable.  The stomach is not well distended. No definite renal calculi are seen and there is no evidence of hydronephrosis.  The abdominal aorta is normal in caliber.  The uterus is slightly anteverted.  Probable small ovarian follicles are present bilaterally, and no significant free fluid is evident on this unenhanced study.  The appendix fills with air.  No inflammatory process is seen.  No bony abnormality is noted.  IMPRESSION: No significant abnormality on this unenhanced CT of the abdomen and pelvis.   Original Report Authenticated By: Dwyane Dee, M.D.   Abd 1 View (kub)  01/13/2013   *RADIOLOGY REPORT*  Clinical Data: Nausea and vomiting.  ABDOMEN - 1 VIEW  Comparison: CT abdomen and pelvis 01/08/2013.  Findings: The bowel gas pattern is unremarkable.  There is no evidence for obstruction or free air.  The axial skeleton is within normal limits.  IMPRESSION: Negative one-view abdomen.   Original Report Authenticated By: Marin Roberts, M.D.   Dg Abd Acute W/chest  01/08/2013   *RADIOLOGY REPORT*  Clinical Data: Abdominal pain.  Nausea and vomiting.  ACUTE ABDOMEN SERIES (ABDOMEN 2 VIEW & CHEST 1 VIEW)  Comparison: None.  Findings: Bowel gas pattern is normal without evidence of ileus, obstruction nor free air.  No worrisome calcifications or bony findings.  One-view chest shows normal heart and mediastinal shadows.  Lungs are clear.  No effusions.  IMPRESSION: Normal acute abdominal series   Original Report Authenticated By: Paulina Fusi, M.D.    Microbiology: Recent Results (from the past 240 hour(s))  GC/CHLAMYDIA PROBE AMP     Status: None   Collection Time    01/12/13  7:58 PM      Result Value Range Status   CT Probe RNA NEGATIVE  NEGATIVE Final   GC Probe RNA NEGATIVE  NEGATIVE Final   Comment: (NOTE)                                                                                              Normal Reference Range: Negative          Assay  performed using the Gen-Probe APTIMA COMBO2 (R) Assay.     Acceptable specimen types for this assay include APTIMA Swabs (Unisex,     endocervical, urethral, or vaginal), first void urine, and ThinPrep     liquid based cytology samples.  WET PREP, GENITAL  Status: None   Collection Time    01/12/13  7:59 PM      Result Value Range Status   Yeast Wet Prep HPF POC NONE SEEN  NONE SEEN Final   Trich, Wet Prep NONE SEEN  NONE SEEN Final   Clue Cells Wet Prep HPF POC NONE SEEN  NONE SEEN Final   WBC, Wet Prep HPF POC NONE SEEN  NONE SEEN Final     Labs: Basic Metabolic Panel:  Recent Labs Lab 01/08/13 1035 01/12/13 2140 01/14/13 0440  NA 141 140 139  K 3.4* 4.2 3.6  CL 105 106 106  CO2 21 21 25   GLUCOSE 97 83 84  BUN 14 14 9   CREATININE 0.90 0.84 0.84  CALCIUM 10.8* 8.9 9.3   Liver Function Tests:  Recent Labs Lab 01/08/13 1035 01/12/13 2140  AST 22 25  ALT 17 13  ALKPHOS 52 41  BILITOT 1.1 1.1  PROT 8.1 6.8  ALBUMIN 4.7 3.9    Recent Labs Lab 01/08/13 1035 01/12/13 2140  LIPASE 15 21   No results found for this basename: AMMONIA,  in the last 168 hours CBC:  Recent Labs Lab 01/08/13 1035 01/12/13 2140  WBC 10.7* 8.4  NEUTROABS 8.7* 7.5  HGB 16.3* 15.0  HCT 46.4* 41.7  MCV 94.5 92.5  PLT 248 230   Cardiac Enzymes: No results found for this basename: CKTOTAL, CKMB, CKMBINDEX, TROPONINI,  in the last 168 hours BNP: BNP (last 3 results) No results found for this basename: PROBNP,  in the last 8760 hours CBG: No results found for this basename: GLUCAP,  in the last 168 hours     Signed:  Minami Arriaga C  [facility] 01/14/2013, 12:16 PM

## 2013-03-07 ENCOUNTER — Other Ambulatory Visit: Payer: Self-pay | Admitting: Gastroenterology

## 2013-03-07 DIAGNOSIS — R1110 Vomiting, unspecified: Secondary | ICD-10-CM

## 2013-03-30 ENCOUNTER — Ambulatory Visit
Admission: RE | Admit: 2013-03-30 | Discharge: 2013-03-30 | Disposition: A | Discharge status: Home / Self Care | Payer: BC Managed Care – PPO | Source: Ambulatory Visit | Attending: Gastroenterology | Admitting: Gastroenterology

## 2013-03-30 DIAGNOSIS — R1110 Vomiting, unspecified: Secondary | ICD-10-CM

## 2013-05-19 ENCOUNTER — Emergency Department (HOSPITAL_COMMUNITY)
Admission: EM | Admit: 2013-05-19 | Discharge: 2013-05-19 | Disposition: A | Discharge status: Home / Self Care | Payer: BC Managed Care – PPO | Attending: Emergency Medicine | Admitting: Emergency Medicine

## 2013-05-19 ENCOUNTER — Encounter (HOSPITAL_COMMUNITY): Payer: Self-pay | Admitting: Emergency Medicine

## 2013-05-19 ENCOUNTER — Emergency Department (HOSPITAL_COMMUNITY)
Admission: EM | Admit: 2013-05-19 | Discharge: 2013-05-20 | Disposition: A | Discharge status: Home / Self Care | Payer: BC Managed Care – PPO | Attending: Emergency Medicine | Admitting: Emergency Medicine

## 2013-05-19 DIAGNOSIS — R51 Headache: Secondary | ICD-10-CM | POA: Insufficient documentation

## 2013-05-19 DIAGNOSIS — N898 Other specified noninflammatory disorders of vagina: Secondary | ICD-10-CM

## 2013-05-19 DIAGNOSIS — Z3202 Encounter for pregnancy test, result negative: Secondary | ICD-10-CM | POA: Insufficient documentation

## 2013-05-19 DIAGNOSIS — R112 Nausea with vomiting, unspecified: Secondary | ICD-10-CM | POA: Insufficient documentation

## 2013-05-19 DIAGNOSIS — R109 Unspecified abdominal pain: Secondary | ICD-10-CM | POA: Insufficient documentation

## 2013-05-19 DIAGNOSIS — R1110 Vomiting, unspecified: Secondary | ICD-10-CM

## 2013-05-19 DIAGNOSIS — M549 Dorsalgia, unspecified: Secondary | ICD-10-CM | POA: Insufficient documentation

## 2013-05-19 DIAGNOSIS — Z79899 Other long term (current) drug therapy: Secondary | ICD-10-CM | POA: Insufficient documentation

## 2013-05-19 LAB — POCT PREGNANCY, URINE: Preg Test, Ur: NEGATIVE

## 2013-05-19 LAB — COMPREHENSIVE METABOLIC PANEL
ALT: 13 U/L (ref 0–35)
AST: 19 U/L (ref 0–37)
Alkaline Phosphatase: 48 U/L (ref 39–117)
CO2: 20 mEq/L (ref 19–32)
GFR calc Af Amer: >90 mL/min (ref >90)
Glucose, Bld: 137 mg/dL — ABNORMAL HIGH (ref 70–99)
Potassium: 3.1 mEq/L — ABNORMAL LOW (ref 3.5–5.1)
Sodium: 139 mEq/L (ref 135–145)
Total Protein: 7.6 g/dL (ref 6.0–8.3)

## 2013-05-19 LAB — URINE MICROSCOPIC-ADD ON

## 2013-05-19 LAB — URINALYSIS, ROUTINE W REFLEX MICROSCOPIC
Bilirubin Urine: NEGATIVE
Ketones, ur: 15 mg/dL — AB
Nitrite: NEGATIVE
pH: 8 (ref 5.0–8.0)

## 2013-05-19 LAB — CBC WITH DIFFERENTIAL/PLATELET
Basophils Absolute: 0 10*3/uL (ref 0.0–0.1)
Eosinophils Absolute: 0 10*3/uL (ref 0.0–0.7)
Lymphocytes Relative: 12 % (ref 12–46)
Lymphs Abs: 1.3 10*3/uL (ref 0.7–4.0)
Neutrophils Relative %: 86 % — ABNORMAL HIGH (ref 43–77)
Platelets: 234 10*3/uL (ref 150–400)
RBC: 4.6 MIL/uL (ref 3.87–5.11)
WBC: 10.9 10*3/uL — ABNORMAL HIGH (ref 4.0–10.5)

## 2013-05-19 LAB — WET PREP, GENITAL
Clue Cells Wet Prep HPF POC: NONE SEEN
Trich, Wet Prep: NONE SEEN

## 2013-05-19 LAB — CG4 I-STAT (LACTIC ACID): Lactic Acid, Venous: 1.52 mmol/L (ref 0.5–2.2)

## 2013-05-19 MED ORDER — PANTOPRAZOLE SODIUM 40 MG IV SOLR
40.0000 mg | Freq: Once | INTRAVENOUS | Status: AC
  Administered 2013-05-19: 40 mg via INTRAVENOUS
  Filled 2013-05-19: qty 40

## 2013-05-19 MED ORDER — PROMETHAZINE HCL 25 MG/ML IJ SOLN
25.0000 mg | Freq: Once | INTRAMUSCULAR | Status: AC
  Administered 2013-05-19: 25 mg via INTRAMUSCULAR
  Filled 2013-05-19: qty 1

## 2013-05-19 MED ORDER — ONDANSETRON HCL 4 MG PO TABS: 4.0000 mg | ORAL_TABLET | Freq: Four times a day (QID) | ORAL | Status: DC

## 2013-05-19 MED ORDER — MORPHINE SULFATE 2 MG/ML IJ SOLN
2.0000 mg | Freq: Once | INTRAMUSCULAR | Status: AC
  Administered 2013-05-19: 2 mg via INTRAVENOUS
  Filled 2013-05-19: qty 1

## 2013-05-19 MED ORDER — METOCLOPRAMIDE HCL 5 MG/ML IJ SOLN
10.0000 mg | Freq: Once | INTRAMUSCULAR | Status: AC
  Administered 2013-05-19: 10 mg via INTRAVENOUS
  Filled 2013-05-19: qty 2

## 2013-05-19 MED ORDER — PROMETHAZINE HCL 25 MG/ML IJ SOLN
25.0000 mg | Freq: Once | INTRAMUSCULAR | Status: AC
  Administered 2013-05-19: 25 mg via INTRAVENOUS
  Filled 2013-05-19: qty 1

## 2013-05-19 MED ORDER — ONDANSETRON 4 MG PO TBDP
4.0000 mg | ORAL_TABLET | Freq: Once | ORAL | Status: DC
  Filled 2013-05-19: qty 1

## 2013-05-19 MED ORDER — SODIUM CHLORIDE 0.9 % IV BOLUS (SEPSIS)
1000.0000 mL | Freq: Once | INTRAVENOUS | Status: AC
  Administered 2013-05-19: 1000 mL via INTRAVENOUS

## 2013-05-19 MED ORDER — ONDANSETRON HCL 4 MG/2ML IJ SOLN: 4.0000 mg | Freq: Once | INTRAMUSCULAR | Status: DC

## 2013-05-19 MED ORDER — PROMETHAZINE HCL 25 MG PO TABS: 25.0000 mg | ORAL_TABLET | Freq: Four times a day (QID) | ORAL | Status: DC | PRN

## 2013-05-19 MED ORDER — MORPHINE SULFATE 4 MG/ML IJ SOLN
2.0000 mg | Freq: Once | INTRAMUSCULAR | Status: AC
  Administered 2013-05-19: 2 mg via INTRAVENOUS
  Filled 2013-05-19: qty 1

## 2013-05-19 MED ORDER — KETOROLAC TROMETHAMINE 30 MG/ML IJ SOLN
30.0000 mg | Freq: Once | INTRAMUSCULAR | Status: AC
  Administered 2013-05-19: 30 mg via INTRAVENOUS
  Filled 2013-05-19: qty 1

## 2013-05-19 MED ORDER — METRONIDAZOLE 500 MG PO TABS: 500.0000 mg | ORAL_TABLET | Freq: Two times a day (BID) | ORAL | Status: DC

## 2013-05-19 MED ORDER — ONDANSETRON HCL 4 MG/2ML IJ SOLN
4.0000 mg | Freq: Once | INTRAMUSCULAR | Status: AC
  Administered 2013-05-19: 4 mg via INTRAVENOUS
  Filled 2013-05-19: qty 2

## 2013-05-19 NOTE — ED Notes (Signed)
Provided patient with sprite and crackers per P.A request.

## 2013-05-19 NOTE — ED Notes (Signed)
Pt tolerating sips of ginger ale.

## 2013-05-19 NOTE — ED Notes (Signed)
Pt woke up at 0730 and vomited times 5. Pt has allergy to pot and she smoked it last nite.  Pt reports constipation and reports her chest is burning.  Denies urinary symptoms, but reports white vaginal discharge.  LMP was 2-3 weeks ago and she is on depo

## 2013-05-19 NOTE — ED Notes (Signed)
Pt ambulated to and from bathroom independently

## 2013-05-19 NOTE — ED Notes (Signed)
Pt was seen here earlier today and d'cd home with Rx's.  Mom st's she was sleeping and when she woke up she gave her a Phenergan PO and she vomited it up.  Pt has vomited x's 2 since being discharged earlier today.

## 2013-05-19 NOTE — ED Notes (Signed)
Family at bedside. 

## 2013-05-19 NOTE — ED Notes (Signed)
The pt is having nv since this am and she has already been seen here today for the same.  She continues to have nv  And is unable to keep her meds down

## 2013-05-19 NOTE — ED Provider Notes (Signed)
CSN: [id number]     Arrival date & time 05/19/13  1812 History   First MD Initiated Contact with Patient 05/19/13 1838     Chief Complaint  Patient presents with  . Emesis   (Consider location/radiation/quality/duration/timing/severity/associated sxs/prior Treatment) The history is provided by the patient and a parent. No language interpreter was used.  Angela Christian is a 17 year old female presenting to emergency department with nausea and vomiting. Patient was actually seen in the emergency department earlier this morning, where numerous blood work and pelvic exam was performed. As per patient, reported that her emesis started at approximately 7:30 AM this morning. As per mother, reported that patient had 2 episodes of emesis mainly of stomach contents-nonbloody and nonbilious. Patient reported that she had mild chest burning secondary to the vomiting episodes. Reported that she smoked marijuana last night, at approximately 9:40 PM, 1 blunt. Patient reports that when she smokes marijuana she normally gets a reaction like this. The central seconds 2 weeks ago, reported that she takes Depakote shots-reported that she started episodes when she was 17 years old. Denied fever, diarrhea, melena, hematochezia, weakness, tingling, numbness, dysuria, neck pain, neck stiffness, hematuria, blurred vision. PCP Dr. Shaune Pollack  History reviewed. No pertinent past medical history. Past Surgical History  Procedure Laterality Date  . Tonsillectomy     No family history on file. History  Substance Use Topics  . Smoking status: Never Smoker   . Smokeless tobacco: Not on file  . Alcohol Use: Yes     Comment: occ   OB History   Grav Para Term Preterm Abortions TAB SAB Ect Mult Living                 Review of Systems  Constitutional: Negative for fever and chills.  Eyes: Negative for visual disturbance.  Respiratory: Negative for chest tightness and stridor.   Cardiovascular: Negative for chest  pain.  Gastrointestinal: Positive for nausea, vomiting and abdominal pain. Negative for diarrhea, constipation, blood in stool and anal bleeding.  Genitourinary: Positive for vaginal discharge. Negative for vaginal bleeding, vaginal pain and pelvic pain.  Musculoskeletal: Positive for back pain.  Neurological: Positive for headaches. Negative for dizziness and weakness.  All other systems reviewed and are negative.    Allergies  Other  Home Medications   Current Outpatient Rx  Name  Route  Sig  Dispense  Refill  . bismuth subsalicylate (PEPTO BISMOL) 262 MG/15ML suspension   Oral   Take 30 mLs by mouth every 6 (six) hours as needed for indigestion.         . promethazine (PHENERGAN) 25 MG tablet   Oral   Take 1 tablet (25 mg total) by mouth every 6 (six) hours as needed for nausea.   12 tablet   0   . Tetrahydrozoline HCl (VISINE OP)   Ophthalmic   Apply 1 drop to eye daily as needed (contacts irritation).         Marland Kitchen zolpidem (AMBIEN) 10 MG tablet   Oral   Take 10 mg by mouth at bedtime as needed for sleep.         . medroxyPROGESTERone (DEPO-PROVERA) 150 MG/ML injection   Intramuscular   Inject 150 mg into the muscle every 3 (three) months.         . metroNIDAZOLE (FLAGYL) 500 MG tablet   Oral   Take 1 tablet (500 mg total) by mouth 2 (two) times daily.   14 tablet   0   .  ondansetron (ZOFRAN) 4 MG tablet   Oral   Take 1 tablet (4 mg total) by mouth every 6 (six) hours.   12 tablet   0    BP 145/69  Pulse 72  Temp(Src) 97.9 F (36.6 C) (Oral)  Resp 16  Ht 5\' 4"  (1.626 m)  Wt 110 lb 1 oz (49.924 kg)  BMI 18.88 kg/m2  SpO2 100% Physical Exam  Nursing note and vitals reviewed. Constitutional: She is oriented to person, place, and time. She appears well-developed and well-nourished. No distress.  HENT:  Head: Normocephalic and atraumatic.  Mouth/Throat: Oropharynx is clear and moist. No oropharyngeal exudate.  Eyes: Conjunctivae and EOM are  normal. Pupils are equal, round, and reactive to light. Right eye exhibits no discharge. Left eye exhibits no discharge.  Neck: Normal range of motion. Neck supple.  Cardiovascular: Normal rate, regular rhythm and normal heart sounds.  Exam reveals no friction rub.   No murmur heard. Pulmonary/Chest: Effort normal and breath sounds normal. No respiratory distress. She has no wheezes. She has no rales.  Abdominal: Soft. Bowel sounds are normal. She exhibits no distension. There is no tenderness.  Negative acute abdomen, negative peritoneal signs  Musculoskeletal: Normal range of motion.  Neurological: She is alert and oriented to person, place, and time. She exhibits normal muscle tone. Coordination normal.  Skin: Skin is warm and dry. No rash noted. She is not diaphoretic. No erythema.  Psychiatric: She has a normal mood and affect. Her behavior is normal. Thought content normal.    ED Course  Procedures (including critical care time)  This provider reviewed the patient's chart. Patient was seen in the ED back in 12/2012 with similar symptoms of nausea and emesis with abdominal pain. Patient was admitted to the hospital for approximately 2 days for dehydration.   10:31 PM This provider went to go speak with the patient and re-assess. Patient unable to keep down the sprite. Patient reported that she is having back pain and headache. IV fluids, IV reglan, IV pain medications given.   These results performed earlier today when patient was seen in the ED setting. Patient was diagnosed with BV - discharged with zofran, phenergan, and flagyl. This is the patient's second visit to the ED.  Results for orders placed during the hospital encounter of 05/19/13  WET PREP, GENITAL      Result Value Range   Yeast Wet Prep HPF POC NONE SEEN  NONE SEEN   Trich, Wet Prep NONE SEEN  NONE SEEN   Clue Cells Wet Prep HPF POC NONE SEEN  NONE SEEN   WBC, Wet Prep HPF POC MANY (*) NONE SEEN  CBC WITH DIFFERENTIAL       Result Value Range   WBC 10.9 (*) 4.0 - 10.5 K/uL   RBC 4.60  3.87 - 5.11 MIL/uL   Hemoglobin 15.4 (*) 12.0 - 15.0 g/dL   HCT 16.1  09.6 - 04.5 %   MCV 98.0  78.0 - 100.0 fL   MCH 33.5  26.0 - 34.0 pg   MCHC 34.1  30.0 - 36.0 g/dL   RDW 40.9  81.1 - 91.4 %   Platelets 234  150 - 400 K/uL   Neutrophils Relative % 86 (*) 43 - 77 %   Neutro Abs 9.4 (*) 1.7 - 7.7 K/uL   Lymphocytes Relative 12  12 - 46 %   Lymphs Abs 1.3  0.7 - 4.0 K/uL   Monocytes Relative 2 (*) 3 - 12 %  Monocytes Absolute 0.2  0.1 - 1.0 K/uL   Eosinophils Relative 0  0 - 5 %   Eosinophils Absolute 0.0  0.0 - 0.7 K/uL   Basophils Relative 0  0 - 1 %   Basophils Absolute 0.0  0.0 - 0.1 K/uL  COMPREHENSIVE METABOLIC PANEL      Result Value Range   Sodium 139  135 - 145 mEq/L   Potassium 3.1 (*) 3.5 - 5.1 mEq/L   Chloride 104  96 - 112 mEq/L   CO2 20  19 - 32 mEq/L   Glucose, Bld 137 (*) 70 - 99 mg/dL   BUN 11  6 - 23 mg/dL   Creatinine, Ser 1.61  0.50 - 1.10 mg/dL   Calcium 9.9  8.4 - 09.6 mg/dL   Total Protein 7.6  6.0 - 8.3 g/dL   Albumin 4.4  3.5 - 5.2 g/dL   AST 19  0 - 37 U/L   ALT 13  0 - 35 U/L   Alkaline Phosphatase 48  39 - 117 U/L   Total Bilirubin 0.6  0.3 - 1.2 mg/dL   GFR calc non Af Amer >90  >90 mL/min   GFR calc Af Amer >90  >90 mL/min  URINALYSIS, ROUTINE W REFLEX MICROSCOPIC      Result Value Range   Color, Urine YELLOW  YELLOW   APPearance CLOUDY (*) CLEAR   Specific Gravity, Urine 1.023  1.005 - 1.030   pH 8.0  5.0 - 8.0   Glucose, UA 250 (*) NEGATIVE mg/dL   Hgb urine dipstick SMALL (*) NEGATIVE   Bilirubin Urine NEGATIVE  NEGATIVE   Ketones, ur 15 (*) NEGATIVE mg/dL   Protein, ur NEGATIVE  NEGATIVE mg/dL   Urobilinogen, UA 0.2  0.0 - 1.0 mg/dL   Nitrite NEGATIVE  NEGATIVE   Leukocytes, UA MODERATE (*) NEGATIVE  URINE MICROSCOPIC-ADD ON      Result Value Range   WBC, UA 11-20  <3 WBC/hpf   RBC / HPF 3-6  <3 RBC/hpf   Bacteria, UA RARE  RARE  POCT PREGNANCY, URINE       Result Value Range   Preg Test, Ur NEGATIVE  NEGATIVE    Labs Review Labs Reviewed - No data to display Imaging Review No results found.  EKG Interpretation   None       MDM  No diagnosis found.  Patient presenting to the ED with nausea and emesis that has been ongoing since this morning at approximately 7:30 AM. Patient reported that she was fine yesterday, stated that she smoked marijuana 1 blunt yesterday. Patient was seen in the ED setting this morning- was diagnosed with BV and discharged with flagyl, zofran, and reglan. Patient reported emesis x 2 since discharged this morning from the ED, reports back to the ED secondary to emesis and inability to keep anything down. Mother reported that she tried to give the patient phenergan, but reported that the patient spit it back up. Alert and oriented. Lungs clear to auscultation bilaterally. Heart rate and rhythm normal. Pulses palpable and strong. BS normoactive in all 4 quadrants - soft, non-tender. Negative acute abdomen, negative peritoneal signs. Negative acute distress noted.  Labs ordered. Patient given IV fluids, IV antiemetics.  Discussed case with Ruby Cola, PA-C. Transfer of care to [employer], PA-C.   Raymon Mutton, PA-C 05/20/13 [address] PA-C 05/20/13 1302

## 2013-05-19 NOTE — ED Provider Notes (Signed)
CSN: [id number]     Arrival date & time 05/19/13  4098 History   First MD Initiated Contact with Patient 05/19/13 [phone]     Chief Complaint  Patient presents with  . Emesis  . Constipation  . chest burns    (Consider location/radiation/quality/duration/timing/severity/associated sxs/prior Treatment) HPI Comments: Patient presents emergency department with chief complaint of vomiting. She states the symptoms began around 7:30 this morning. She states that she has felt like this once before, and that was after smoking pot. She believes she has an allergy to pot. She denies diarrhea or constipation. Denies any hematemesis. Denies abdominal pain. Additionally, she states that she has had vaginal discharge for the past week or so. LMP was 2 weeks ago. She is currently taking depo. She denies any other symptoms at this time.  The history is provided by the patient. No language interpreter was used.    History reviewed. No pertinent past medical history. Past Surgical History  Procedure Laterality Date  . Tonsillectomy     No family history on file. History  Substance Use Topics  . Smoking status: Never Smoker   . Smokeless tobacco: Not on file  . Alcohol Use: Yes     Comment: occ   OB History   Grav Para Term Preterm Abortions TAB SAB Ect Mult Living                 Review of Systems  All other systems reviewed and are negative.    Allergies  Other  Home Medications   Current Outpatient Rx  Name  Route  Sig  Dispense  Refill  . bismuth subsalicylate (PEPTO BISMOL) 262 MG/15ML suspension   Oral   Take 30 mLs by mouth every 6 (six) hours as needed for indigestion.         . medroxyPROGESTERone (DEPO-PROVERA) 150 MG/ML injection   Intramuscular   Inject 150 mg into the muscle every 3 (three) months.         . promethazine (PHENERGAN) 25 MG tablet   Oral   Take 1 tablet (25 mg total) by mouth every 6 (six) hours as needed for nausea.   30 tablet   0    BP 110/88   Pulse 75  Temp(Src) 97.3 F (36.3 C) (Oral)  Resp 15  SpO2 100% Physical Exam  Nursing note and vitals reviewed. Constitutional: She is oriented to person, place, and time. She appears well-developed and well-nourished.  HENT:  Head: Normocephalic and atraumatic.  Eyes: Conjunctivae and EOM are normal. Pupils are equal, round, and reactive to light.  Neck: Normal range of motion. Neck supple.  Cardiovascular: Normal rate and regular rhythm.  Exam reveals no gallop and no friction rub.   No murmur heard. Pulmonary/Chest: Effort normal and breath sounds normal. No respiratory distress. She has no wheezes. She has no rales. She exhibits no tenderness.  Abdominal: Soft. She exhibits no distension and no mass. There is no tenderness. There is no rebound and no guarding. Hernia confirmed negative in the right inguinal area and confirmed negative in the left inguinal area.  No focal abdominal tenderness, no pain at McBurney's point, no Murphy's sign, no signs surgical acute abdomen  Genitourinary: No labial fusion. There is no rash, tenderness, lesion or injury on the right labia. There is no rash, tenderness, lesion or injury on the left labia. Uterus is not deviated, not enlarged, not fixed and not tender. Cervix exhibits no motion tenderness, no discharge and no friability. Right adnexum  displays no mass, no tenderness and no fullness. Left adnexum displays no mass, no tenderness and no fullness. No erythema, tenderness or bleeding around the vagina. No foreign body around the vagina. No signs of injury around the vagina. Vaginal discharge found.  Chaperone present  Musculoskeletal: Normal range of motion. She exhibits no edema and no tenderness.  Lymphadenopathy:       Right: No inguinal adenopathy present.       Left: No inguinal adenopathy present.  Neurological: She is alert and oriented to person, place, and time.  Skin: Skin is warm and dry.  Psychiatric: She has a normal mood and affect.  Her behavior is normal. Judgment and thought content normal.    ED Course  Procedures (including critical care time) Results for orders placed during the hospital encounter of 05/19/13  WET PREP, GENITAL      Result Value Range   Yeast Wet Prep HPF POC NONE SEEN  NONE SEEN   Trich, Wet Prep NONE SEEN  NONE SEEN   Clue Cells Wet Prep HPF POC NONE SEEN  NONE SEEN   WBC, Wet Prep HPF POC MANY (*) NONE SEEN  CBC WITH DIFFERENTIAL      Result Value Range   WBC 10.9 (*) 4.0 - 10.5 K/uL   RBC 4.60  3.87 - 5.11 MIL/uL   Hemoglobin 15.4 (*) 12.0 - 15.0 g/dL   HCT 04.5  40.9 - 81.1 %   MCV 98.0  78.0 - 100.0 fL   MCH 33.5  26.0 - 34.0 pg   MCHC 34.1  30.0 - 36.0 g/dL   RDW 91.4  78.2 - 95.6 %   Platelets 234  150 - 400 K/uL   Neutrophils Relative % 86 (*) 43 - 77 %   Neutro Abs 9.4 (*) 1.7 - 7.7 K/uL   Lymphocytes Relative 12  12 - 46 %   Lymphs Abs 1.3  0.7 - 4.0 K/uL   Monocytes Relative 2 (*) 3 - 12 %   Monocytes Absolute 0.2  0.1 - 1.0 K/uL   Eosinophils Relative 0  0 - 5 %   Eosinophils Absolute 0.0  0.0 - 0.7 K/uL   Basophils Relative 0  0 - 1 %   Basophils Absolute 0.0  0.0 - 0.1 K/uL  COMPREHENSIVE METABOLIC PANEL      Result Value Range   Sodium 139  135 - 145 mEq/L   Potassium 3.1 (*) 3.5 - 5.1 mEq/L   Chloride 104  96 - 112 mEq/L   CO2 20  19 - 32 mEq/L   Glucose, Bld 137 (*) 70 - 99 mg/dL   BUN 11  6 - 23 mg/dL   Creatinine, Ser 2.13  0.50 - 1.10 mg/dL   Calcium 9.9  8.4 - 08.6 mg/dL   Total Protein 7.6  6.0 - 8.3 g/dL   Albumin 4.4  3.5 - 5.2 g/dL   AST 19  0 - 37 U/L   ALT 13  0 - 35 U/L   Alkaline Phosphatase 48  39 - 117 U/L   Total Bilirubin 0.6  0.3 - 1.2 mg/dL   GFR calc non Af Amer >90  >90 mL/min   GFR calc Af Amer >90  >90 mL/min  URINALYSIS, ROUTINE W REFLEX MICROSCOPIC      Result Value Range   Color, Urine YELLOW  YELLOW   APPearance CLOUDY (*) CLEAR   Specific Gravity, Urine 1.023  1.005 - 1.030   pH 8.0  5.0 -  8.0   Glucose, UA 250 (*)  NEGATIVE mg/dL   Hgb urine dipstick SMALL (*) NEGATIVE   Bilirubin Urine NEGATIVE  NEGATIVE   Ketones, ur 15 (*) NEGATIVE mg/dL   Protein, ur NEGATIVE  NEGATIVE mg/dL   Urobilinogen, UA 0.2  0.0 - 1.0 mg/dL   Nitrite NEGATIVE  NEGATIVE   Leukocytes, UA MODERATE (*) NEGATIVE  URINE MICROSCOPIC-ADD ON      Result Value Range   WBC, UA 11-20  <3 WBC/hpf   RBC / HPF 3-6  <3 RBC/hpf   Bacteria, UA RARE  RARE  POCT PREGNANCY, URINE      Result Value Range   Preg Test, Ur NEGATIVE  NEGATIVE   No results found.  Imaging Review No results found.  EKG Interpretation   None       MDM   1. Nausea and vomiting   2. Vaginal discharge     Patient with nausea and vomiting, suspect was from smoking pot, however will check basic electrolytes, patient also having vaginal discharge. Will perform pelvic exam. Will reevaluate.  10:42 AM Pelvic exam remarkable for discharge.  Mother is very concerned that the patient's most recent BP reading is 144/76.  She requests a d-dimer.  I explained to the mother that I did not think that a d-dimer is indicated, as I do not see any other evidence of blood clot.  No chest pain or SOB.  No tachycardia or hypoxia.  I also explained to the mother than an isolated high BP is not enough information to indicate treatment.  I discussed this with Dr. Criss Alvine, who agrees.  1:55 PM Patient has tolerated oral intake. Repeat abdominal exam is benign, soft, non-tender, no abdominal pain. Will discharge to home. Patient is stable appearing she has been resting, relief for several hours. Recommend plain diet. Return precautions are given. Patient is stable and ready for discharge.   Roxy Horseman, PA-C 05/19/13 1356  Roxy Horseman, PA-C 05/19/13 1407

## 2013-05-19 NOTE — ED Provider Notes (Signed)
Medical screening examination/treatment/procedure(s) were performed by non-physician practitioner and as supervising physician I was immediately available for consultation/collaboration.  EKG Interpretation   None         Audree Camel, MD 05/19/13 [phone]

## 2013-05-20 ENCOUNTER — Encounter (HOSPITAL_COMMUNITY): Payer: Self-pay | Admitting: Emergency Medicine

## 2013-05-20 ENCOUNTER — Emergency Department (HOSPITAL_COMMUNITY)
Admission: EM | Admit: 2013-05-20 | Discharge: 2013-05-20 | Discharge status: Against Medical Advice | Payer: BC Managed Care – PPO | Attending: Emergency Medicine | Admitting: Emergency Medicine

## 2013-05-20 DIAGNOSIS — R112 Nausea with vomiting, unspecified: Secondary | ICD-10-CM | POA: Insufficient documentation

## 2013-05-20 MED ORDER — PROMETHAZINE HCL 25 MG RE SUPP: 25.0000 mg | Freq: Four times a day (QID) | RECTAL | Status: DC | PRN

## 2013-05-20 NOTE — ED Notes (Signed)
Pt requested pain medication to take home. Pt informed we cannot give medications from facility to take home.

## 2013-05-20 NOTE — ED Provider Notes (Signed)
Pt received from [place], PA-C.  Pt presented to ED for the second time today with N/V.  Her symptoms are finally controlled.  Pt sleeping and appears comfortable upon entering room.  VSS.  She is tolerating pos.  Prescribed phenergan suppositories in case she is unable to tolerate po anti-emetics.  Return precautions discussed. 12:16 AM   Otilio Miu, PA-C 05/20/13 [phone]

## 2013-05-20 NOTE — ED Notes (Signed)
Pt states she is tired of waiting and is going to go home.  Encouraged pt to stay and explained to her that there was only 2 people in front of her and multiple discharges in the back.  Informed her that she should be going to a room soon.  Pt states she is still leaving.

## 2013-05-20 NOTE — ED Notes (Signed)
The pt has been here having nv since yesterday.  She was seen here yesterday x 2 for the same.  She is back today because she ate some  Chicken noodle soup and she started vomiting again.  She looks better than she did yesterday.  lmp 2-3 weeks ago

## 2013-05-21 ENCOUNTER — Emergency Department (HOSPITAL_COMMUNITY): Payer: BC Managed Care – PPO

## 2013-05-21 ENCOUNTER — Emergency Department (HOSPITAL_COMMUNITY)
Admission: EM | Admit: 2013-05-21 | Discharge: 2013-05-21 | Disposition: A | Discharge status: Home / Self Care | Payer: BC Managed Care – PPO | Attending: Emergency Medicine | Admitting: Emergency Medicine

## 2013-05-21 ENCOUNTER — Encounter (HOSPITAL_COMMUNITY): Payer: Self-pay | Admitting: Emergency Medicine

## 2013-05-21 DIAGNOSIS — F1210 Cannabis abuse, uncomplicated: Secondary | ICD-10-CM | POA: Insufficient documentation

## 2013-05-21 DIAGNOSIS — Z79899 Other long term (current) drug therapy: Secondary | ICD-10-CM | POA: Insufficient documentation

## 2013-05-21 DIAGNOSIS — R112 Nausea with vomiting, unspecified: Secondary | ICD-10-CM | POA: Insufficient documentation

## 2013-05-21 DIAGNOSIS — R1084 Generalized abdominal pain: Secondary | ICD-10-CM | POA: Insufficient documentation

## 2013-05-21 DIAGNOSIS — R1110 Vomiting, unspecified: Secondary | ICD-10-CM

## 2013-05-21 LAB — POCT I-STAT, CHEM 8
BUN: 10 mg/dL (ref 6–23)
Calcium, Ion: 1.23 mmol/L (ref 1.12–1.23)
Chloride: 106 mEq/L (ref 96–112)
Creatinine, Ser: 1 mg/dL (ref 0.50–1.10)
Glucose, Bld: 81 mg/dL (ref 70–99)
Potassium: 3.2 mEq/L — ABNORMAL LOW (ref 3.5–5.1)

## 2013-05-21 LAB — GC/CHLAMYDIA PROBE AMP
CT Probe RNA: POSITIVE — AB
GC Probe RNA: POSITIVE — AB

## 2013-05-21 LAB — CG4 I-STAT (LACTIC ACID): Lactic Acid, Venous: 1.66 mmol/L (ref 0.5–2.2)

## 2013-05-21 MED ORDER — SODIUM CHLORIDE 0.9 % IV BOLUS (SEPSIS)
1000.0000 mL | Freq: Once | INTRAVENOUS | Status: AC
  Administered 2013-05-21: 1000 mL via INTRAVENOUS

## 2013-05-21 MED ORDER — HYDROCODONE-ACETAMINOPHEN 5-325 MG PO TABS: 1.0000 | ORAL_TABLET | ORAL | Status: DC | PRN

## 2013-05-21 MED ORDER — KETOROLAC TROMETHAMINE 30 MG/ML IJ SOLN
30.0000 mg | Freq: Once | INTRAMUSCULAR | Status: AC
  Administered 2013-05-21: 30 mg via INTRAVENOUS
  Filled 2013-05-21: qty 1

## 2013-05-21 MED ORDER — ONDANSETRON HCL 4 MG/2ML IJ SOLN
4.0000 mg | INTRAMUSCULAR | Status: AC
  Administered 2013-05-21: 4 mg via INTRAVENOUS
  Filled 2013-05-21: qty 2

## 2013-05-21 MED ORDER — LORAZEPAM 1 MG PO TABS: 1.0000 mg | ORAL_TABLET | Freq: Three times a day (TID) | ORAL | Status: DC | PRN

## 2013-05-21 MED ORDER — MORPHINE SULFATE 4 MG/ML IJ SOLN
4.0000 mg | Freq: Once | INTRAMUSCULAR | Status: AC
  Administered 2013-05-21: 4 mg via INTRAVENOUS
  Filled 2013-05-21: qty 1

## 2013-05-21 MED ORDER — LORAZEPAM 2 MG/ML IJ SOLN
1.0000 mg | Freq: Once | INTRAMUSCULAR | Status: AC
  Administered 2013-05-21: 1 mg via INTRAVENOUS
  Filled 2013-05-21: qty 1

## 2013-05-21 NOTE — ED Notes (Signed)
Pt was seen here on 05/20/2013 for same symptoms

## 2013-05-21 NOTE — ED Notes (Signed)
Pt states epigastric abdominal pain, with nausea and vomiting since Saturday.

## 2013-05-21 NOTE — ED Notes (Signed)
PA at bedside.

## 2013-05-21 NOTE — ED Provider Notes (Signed)
6:45 AM Patient signed out to me by Antony Madura, PA-C. Patient presents with nausea and vomiting. Labs unremarkable for acute changes. Patient was signed out pending abdominal US. The [place] is unremarkable for any acute changes. Patient will be discharged with GI follow up.   Results for orders placed during the hospital encounter of 05/21/13 (from the past 24 hour(s))  POCT I-STAT, CHEM 8     Status: Abnormal   Collection Time    05/21/13  5:48 AM      Result Value Range   Sodium 143  135 - 145 mEq/L   Potassium 3.2 (*) 3.5 - 5.1 mEq/L   Chloride 106  96 - 112 mEq/L   BUN 10  6 - 23 mg/dL   Creatinine, Ser 1.61  0.50 - 1.10 mg/dL   Glucose, Bld 81  70 - 99 mg/dL   Calcium, Ion 0.96  0.45 - 1.23 mmol/L   TCO2 23  0 - 100 mmol/L   Hemoglobin 16.7 (*) 12.0 - 15.0 g/dL   HCT 40.9 (*) 81.1 - 91.4 %  CG4 I-STAT (LACTIC ACID)     Status: None   Collection Time    05/21/13  5:49 AM      Result Value Range   Lactic Acid, Venous 1.66  0.5 - 2.2 mmol/L    .  Emilia Beck, PA-C 05/21/13 [phone]

## 2013-05-21 NOTE — ED Provider Notes (Signed)
Medical screening examination/treatment/procedure(s) were performed by non-physician practitioner and as supervising physician I was immediately available for consultation/collaboration.  EKG Interpretation   None         William Alizandra Loh, MD 05/21/13 0701 

## 2013-05-21 NOTE — ED Provider Notes (Signed)
Medical screening examination/treatment/procedure(s) were performed by non-physician practitioner and as supervising physician I was immediately available for consultation/collaboration.  EKG Interpretation   None         William Emmett Arntz, MD 05/21/13 0715 

## 2013-05-21 NOTE — ED Notes (Signed)
Pt discharge delayed due to medication given.

## 2013-05-21 NOTE — ED Provider Notes (Signed)
CSN: [id number]     Arrival date & time 05/21/13  0444 History   First MD Initiated Contact with Patient 05/21/13 0446     Chief Complaint  Patient presents with  . Emesis   (Consider location/radiation/quality/duration/timing/severity/associated sxs/prior Treatment) HPI Comments: Patient is a 17 year old female with no significant past medical history who presents for persistent nausea and vomiting x2 days; onset after smoking marijuana. Patient has been evaluated twice in the emergency department for the same in the last 48 hours and LWBS after triage yesterday. Patient states she has had 10 episodes of green, nonbloody emesis in the last 24 hours, the last of which was 1 hour PTA. She has only been able to tolerate ice chips and two noodles from some soup. Patient has tried PO phenergan, zofran, and reglan without relief. She denies trying phenergan suppositories. Patient endorsing an associated "burning" in her epigastric region and diffuse abdominal discomfort without localized pain. She denies associated syncope, fever, CP, hematemesis, melena, hematochezia, dysuria, hematuria, numbness/tingling, and vaginal complaints. Last BM was on 05/19/13.  Patient is a 17 y.o. female presenting with vomiting. The history is provided by the patient. No language interpreter was used.  Emesis Associated symptoms: abdominal pain   Associated symptoms: no diarrhea     History reviewed. No pertinent past medical history. Past Surgical History  Procedure Laterality Date  . Tonsillectomy     History reviewed. No pertinent family history. History  Substance Use Topics  . Smoking status: Never Smoker   . Smokeless tobacco: Not on file  . Alcohol Use: Yes     Comment: occ   OB History   Grav Para Term Preterm Abortions TAB SAB Ect Mult Living                 Review of Systems  Constitutional: Negative for fever.  Respiratory: Negative for shortness of breath.   Gastrointestinal: Positive for  nausea, vomiting and abdominal pain. Negative for diarrhea and blood in stool.  Genitourinary: Negative for dysuria, hematuria, vaginal bleeding and vaginal discharge.  Neurological: Negative for syncope.  All other systems reviewed and are negative.    Allergies  Other  Home Medications   Current Outpatient Rx  Name  Route  Sig  Dispense  Refill  . bismuth subsalicylate (PEPTO BISMOL) 262 MG/15ML suspension   Oral   Take 30 mLs by mouth every 6 (six) hours as needed for indigestion.         . medroxyPROGESTERone (DEPO-PROVERA) 150 MG/ML injection   Intramuscular   Inject 150 mg into the muscle every 3 (three) months.         . metroNIDAZOLE (FLAGYL) 500 MG tablet   Oral   Take 1 tablet (500 mg total) by mouth 2 (two) times daily.   14 tablet   0   . ondansetron (ZOFRAN) 4 MG tablet   Oral   Take 1 tablet (4 mg total) by mouth every 6 (six) hours.   12 tablet   0   . promethazine (PHENERGAN) 25 MG suppository   Rectal   Place 1 suppository (25 mg total) rectally every 6 (six) hours as needed for nausea.   12 each   0   . promethazine (PHENERGAN) 25 MG tablet   Oral   Take 1 tablet (25 mg total) by mouth every 6 (six) hours as needed for nausea.   12 tablet   0   . Tetrahydrozoline HCl (VISINE OP)   Ophthalmic   Apply 1  drop to eye daily as needed (contacts irritation).         Marland Kitchen zolpidem (AMBIEN) 10 MG tablet   Oral   Take 10 mg by mouth at bedtime as needed for sleep.          BP 151/88  Pulse 63  Temp(Src) 97.4 F (36.3 C) (Oral)  Resp 18  SpO2 100%  LMP 04/29/2013  Physical Exam  Nursing note and vitals reviewed. Constitutional: She is oriented to person, place, and time. She appears well-developed and well-nourished. No distress.  HENT:  Head: Normocephalic and atraumatic.  Mouth/Throat: Oropharynx is clear and moist. No oropharyngeal exudate.  Airway patent  Eyes: Conjunctivae and EOM are normal. Pupils are equal, round, and reactive  to light. No scleral icterus.  Neck: Normal range of motion. Neck supple.  Cardiovascular: Normal rate, regular rhythm and normal heart sounds.   Pulmonary/Chest: Effort normal. No respiratory distress. She has no wheezes. She has no rales.  Abdominal: Soft. She exhibits no distension and no mass. There is tenderness (diffuse; nonfocal). There is no rebound and no guarding.  No peritoneal signs or evidence of acute surgical abdomen  Musculoskeletal: Normal range of motion.  Neurological: She is alert and oriented to person, place, and time.  Skin: Skin is warm and dry. No rash noted. She is not diaphoretic. No erythema. No pallor.  Psychiatric: She has a normal mood and affect. Her behavior is normal.    ED Course  Procedures (including critical care time) Labs Review Labs Reviewed  POCT I-STAT, CHEM 8 - Abnormal; Notable for the following:    Potassium 3.2 (*)    Hemoglobin 16.7 (*)    HCT 49.0 (*)    All other components within normal limits  CG4 I-STAT (LACTIC ACID)   Imaging Review No results found.  EKG Interpretation   None       MDM  No diagnosis found.  17 year old presents for persistent nausea and vomiting without to 48 hours ago. Patient has been evaluated 2 times in the emergency department for the same complaint with a negative workup. Physical exam today significant only for diffuse, mild abdominal tenderness. Patient without any emesis since arrival in ED. Stable compared to 05/19/2013. Patient also without any signs of dehydration such as tachycardia or decreased turgor. Will tx today with IVF, zofran, and toradol. Abdominal U/S ordered for further work up.  Patient signed out to [facility], PA-C at shift change with imaging pending. Anticipate d/c home with GI follow up after symptoms better controlled.    Antony Madura, [place] 05/21/13 [phone]

## 2013-05-22 NOTE — ED Notes (Signed)
Chart sent to EDP office for review.+ Gonorrhea and chlamydia

## 2013-05-23 NOTE — Progress Notes (Addendum)
ED CM spoke with Midlevel Dammen PA-C. Apparently patient spoke with Midlevel prior to speaking with me. It was determine that the form of Flagyl was the appropriate form for treatment as per Provider.  Pt was contacted and given the information, as per D[place]en PA-C. Pt seemed upset and hung up in the middle of the conversation. No further CM needs identified.

## 2013-05-23 NOTE — Care Management Note (Signed)
ED CM spoke with Midlevel Dammen PA-C. Apparently patient spoke with Midlevel prior to speaking with me. It was determine that the form of Flagyl was the appropriate form for treatment as per Provider.  Pt was contacted and given the information, as per D[place]en PA-C. Pt seemed upset and hung up in the middle of the conversation. No further CM needs identified. 

## 2013-05-23 NOTE — Progress Notes (Signed)
Received call from patient in regards to a prescription she received from the [facility] ED 05/19/13. Pt wanting to change form. Information written on pharmacy sheet and given to Midlevel in [employer].

## 2013-05-23 NOTE — ED Notes (Signed)
Patient called requesting that rx be called to pharmacy for metronidazole suppository instead of instead pills.Current scriber checked with Dahlia Client PA and Dahlia Client wants patient to take zofran along with flagyl pills.

## 2013-05-26 NOTE — ED Notes (Signed)
Chart returned from EDP office. Rx for Doxycyline 100 mg BID x 7 days per Irish Elders

## 2013-05-27 ENCOUNTER — Emergency Department (HOSPITAL_COMMUNITY)
Admission: EM | Admit: 2013-05-27 | Discharge: 2013-05-27 | Disposition: A | Discharge status: Home / Self Care | Payer: BC Managed Care – PPO | Attending: Emergency Medicine | Admitting: Emergency Medicine

## 2013-05-27 ENCOUNTER — Encounter (HOSPITAL_COMMUNITY): Payer: Self-pay | Admitting: Emergency Medicine

## 2013-05-27 DIAGNOSIS — A568 Sexually transmitted chlamydial infection of other sites: Secondary | ICD-10-CM | POA: Insufficient documentation

## 2013-05-27 DIAGNOSIS — A5400 Gonococcal infection of lower genitourinary tract, unspecified: Secondary | ICD-10-CM | POA: Insufficient documentation

## 2013-05-27 DIAGNOSIS — A64 Unspecified sexually transmitted disease: Secondary | ICD-10-CM

## 2013-05-27 MED ORDER — LIDOCAINE HCL (PF) 1 % IJ SOLN
INTRAMUSCULAR | Status: AC
  Administered 2013-05-27: 0.9 mL
  Filled 2013-05-27: qty 5

## 2013-05-27 MED ORDER — AZITHROMYCIN 1 G PO PACK
1.0000 g | PACK | Freq: Once | ORAL | Status: AC
  Administered 2013-05-27: 1 g via ORAL
  Filled 2013-05-27: qty 1

## 2013-05-27 MED ORDER — CEFTRIAXONE SODIUM 250 MG IJ SOLR
250.0000 mg | Freq: Once | INTRAMUSCULAR | Status: AC
  Administered 2013-05-27: 250 mg via INTRAMUSCULAR
  Filled 2013-05-27: qty 250

## 2013-05-27 NOTE — ED Provider Notes (Signed)
CSN: [id number]     Arrival date & time 05/27/13  1400 History  This chart was scribed for non-physician practitioner Francee Piccolo, working with Gilda Crease, * by Carl Best, ED Scribe. This patient was seen in room TR10C/TR10C and the patient's care was started at 2:32 PM.    Chief Complaint  Patient presents with  . std meds     The history is provided by the patient. No language interpreter was used.   HPI Comments: Angela Christian is a 17 y.o. female who presents to the Emergency Department needing treatment for chlamydia and gonorrhea.  She states that she came to the ED last week complaining of cold symptoms and completed blood work.  She states that she received a call from the lab yesterday telling her that she was positive for chlamydia and gonorrhea.  She lists lower abdominal pain and vaginal discharge as associated symptoms.  She denies fever and chills as associated symptoms.  She denies having an OB/GYN.    History reviewed. No pertinent past medical history. Past Surgical History  Procedure Laterality Date  . Tonsillectomy     No family history on file. History  Substance Use Topics  . Smoking status: Never Smoker   . Smokeless tobacco: Not on file  . Alcohol Use: Yes     Comment: occ   OB History   Grav Para Term Preterm Abortions TAB SAB Ect Mult Living                 Review of Systems  Gastrointestinal: Positive for abdominal pain.  Genitourinary: Positive for vaginal discharge.  All other systems reviewed and are negative.    Allergies  Other  Home Medications   Current Outpatient Rx  Name  Route  Sig  Dispense  Refill  . medroxyPROGESTERone (DEPO-PROVERA) 150 MG/ML injection   Intramuscular   Inject 150 mg into the muscle every 3 (three) months.          Triage Vitals: BP 138/73  Pulse 111  Temp(Src) 98.5 F (36.9 C) (Oral)  Resp 16  Wt 112 lb 4.8 oz (50.939 kg)  SpO2 100%  LMP 04/29/2013  Physical Exam   Nursing note and vitals reviewed. Constitutional: She is oriented to person, place, and time. She appears well-developed and well-nourished. No distress.  HENT:  Head: Normocephalic and atraumatic.  Right Ear: External ear normal.  Left Ear: External ear normal.  Nose: Nose normal.  Mouth/Throat: Oropharynx is clear and moist. No oropharyngeal exudate.  Eyes: Conjunctivae are normal.  Neck: Neck supple. No tracheal deviation present.  Cardiovascular: Normal rate, regular rhythm and normal heart sounds.   Pulmonary/Chest: Effort normal and breath sounds normal. No respiratory distress.  Abdominal: Soft. Bowel sounds are normal. There is no tenderness.  Musculoskeletal: Normal range of motion.  Neurological: She is alert and oriented to person, place, and time.  Skin: Skin is warm and dry.  Psychiatric: She has a normal mood and affect. Her behavior is normal.    ED Course  Procedures (including critical care time) Medications  cefTRIAXone (ROCEPHIN) injection 250 mg (250 mg Intramuscular Given 05/27/13 1453)  azithromycin (ZITHROMAX) powder 1 g (1 g Oral Given 05/27/13 1451)  lidocaine (PF) (XYLOCAINE) 1 % injection (0.9 mLs  Given 05/27/13 1454)     DIAGNOSTIC STUDIES: Oxygen Saturation is 100% on room air, normal by my interpretation.    COORDINATION OF CARE: 2:33 PM- Discussed administering treatment for her chlamydia and gonorrhea in the  ED and discharging the patient with a prescription for antibiotics to be taken over the next 10 days.  Advised the patient to follow up with a provider from the [facility] to be reevaluated.  The patient agreed to the treatment plan.    Labs Review Labs Reviewed - No data to display Imaging Review No results found.  EKG Interpretation   None       MDM   1. STD (sexually transmitted disease)     Afebrile, NAD, non-toxic appearing, AAOx4. Patient was seen and diagnosed with Gonorrhea and Chlamydia last week, returning for  treatment. Still continuing to have vaginal discharge, otherwise no complaints. Will go ahead and treat patient. Patient to be discharged with instructions to follow up with OBGYN. Pt has been treated with azithromycin and rocephin. Pt is advised to refrain from sexual activity for 10 days for the medicine to take effect.  Discussed that because pt has had recent unprotected sex, might want to consider getting tested for HIV as well. Counseled pt that latex condoms are the only way to prevent against STDs or HIV. Patient is agreeable to plan. Patient is stable at time of discharge         I personally performed the services described in this documentation, which was scribed in my presence. The recorded information has been reviewed and is accurate.      Jeannetta Ellis, PA-C 05/27/13 1508

## 2013-05-27 NOTE — ED Notes (Addendum)
Mother called and stated that patient wants to return to be treated .

## 2013-05-27 NOTE — ED Notes (Addendum)
Chart return to EDP office for new rx to be written for STD treatment.Patient tested positive for Gonorrhea and Chlamydia

## 2013-05-27 NOTE — ED Notes (Signed)
Pt was called and told she was positive for chlam/gon and told to come to ED for "shots".  Pt very tearful.

## 2013-05-28 ENCOUNTER — Emergency Department (HOSPITAL_COMMUNITY): Payer: BC Managed Care – PPO

## 2013-05-28 ENCOUNTER — Observation Stay (HOSPITAL_COMMUNITY)
Admission: EM | Admit: 2013-05-28 | Discharge: 2013-05-29 | Disposition: A | Discharge status: Home / Self Care | Payer: BC Managed Care – PPO | Attending: Obstetrics & Gynecology | Admitting: Obstetrics & Gynecology

## 2013-05-28 ENCOUNTER — Encounter (HOSPITAL_COMMUNITY): Payer: Self-pay | Admitting: Emergency Medicine

## 2013-05-28 ENCOUNTER — Other Ambulatory Visit: Payer: Self-pay | Admitting: Obstetrics & Gynecology

## 2013-05-28 DIAGNOSIS — R112 Nausea with vomiting, unspecified: Secondary | ICD-10-CM | POA: Insufficient documentation

## 2013-05-28 DIAGNOSIS — R1110 Vomiting, unspecified: Secondary | ICD-10-CM

## 2013-05-28 DIAGNOSIS — N739 Female pelvic inflammatory disease, unspecified: Principal | ICD-10-CM | POA: Insufficient documentation

## 2013-05-28 DIAGNOSIS — N8300 Follicular cyst of ovary, unspecified side: Secondary | ICD-10-CM | POA: Insufficient documentation

## 2013-05-28 DIAGNOSIS — N898 Other specified noninflammatory disorders of vagina: Secondary | ICD-10-CM | POA: Insufficient documentation

## 2013-05-28 DIAGNOSIS — R109 Unspecified abdominal pain: Secondary | ICD-10-CM | POA: Insufficient documentation

## 2013-05-28 LAB — CBC WITH DIFFERENTIAL/PLATELET
Basophils Absolute: 0 10*3/uL (ref 0.0–0.1)
Basophils Relative: 0 % (ref 0–1)
Eosinophils Absolute: 0 10*3/uL (ref 0.0–0.7)
Eosinophils Relative: 0 % (ref 0–5)
HCT: 46.7 % — ABNORMAL HIGH (ref 36.0–46.0)
Hemoglobin: 16.8 g/dL — ABNORMAL HIGH (ref 12.0–15.0)
Lymphocytes Relative: 4 % — ABNORMAL LOW (ref 12–46)
MCHC: 36 g/dL (ref 30.0–36.0)
MCV: 95.7 fL (ref 78.0–100.0)
Monocytes Absolute: 0.1 10*3/uL (ref 0.1–1.0)
Monocytes Relative: 1 % — ABNORMAL LOW (ref 3–12)
Neutro Abs: 9.2 10*3/uL — ABNORMAL HIGH (ref 1.7–7.7)

## 2013-05-28 LAB — URINALYSIS, ROUTINE W REFLEX MICROSCOPIC
Bilirubin Urine: NEGATIVE
Glucose, UA: NEGATIVE mg/dL
Ketones, ur: 15 mg/dL — AB
Nitrite: NEGATIVE
Specific Gravity, Urine: 1.023 (ref 1.005–1.030)
pH: 8 (ref 5.0–8.0)

## 2013-05-28 LAB — URINE MICROSCOPIC-ADD ON

## 2013-05-28 LAB — COMPREHENSIVE METABOLIC PANEL
ALT: 15 U/L (ref 0–35)
AST: 20 U/L (ref 0–37)
BUN: 9 mg/dL (ref 6–23)
CO2: 22 mEq/L (ref 19–32)
Chloride: 101 mEq/L (ref 96–112)
Creatinine, Ser: 0.72 mg/dL (ref 0.50–1.10)
GFR calc non Af Amer: >90 mL/min (ref >90)
Total Bilirubin: 1.1 mg/dL (ref 0.3–1.2)

## 2013-05-28 LAB — WET PREP, GENITAL
Clue Cells Wet Prep HPF POC: NONE SEEN
Trich, Wet Prep: NONE SEEN
Yeast Wet Prep HPF POC: NONE SEEN

## 2013-05-28 LAB — RPR: RPR Ser Ql: NONREACTIVE

## 2013-05-28 MED ORDER — ONDANSETRON 4 MG PO TBDP
4.0000 mg | ORAL_TABLET | Freq: Once | ORAL | Status: AC
  Administered 2013-05-28: 4 mg via ORAL
  Filled 2013-05-28: qty 1

## 2013-05-28 MED ORDER — SODIUM CHLORIDE 0.9 % IV BOLUS (SEPSIS)
1000.0000 mL | Freq: Once | INTRAVENOUS | Status: AC
  Administered 2013-05-28: 1000 mL via INTRAVENOUS

## 2013-05-28 MED ORDER — HYDROMORPHONE HCL PF 1 MG/ML IJ SOLN
0.5000 mg | Freq: Once | INTRAMUSCULAR | Status: AC
  Administered 2013-05-28: 0.5 mg via INTRAVENOUS
  Filled 2013-05-28: qty 1

## 2013-05-28 MED ORDER — DEXTROSE 5 % IV SOLN
2.0000 g | Freq: Once | INTRAVENOUS | Status: AC
  Administered 2013-05-28: 2 g via INTRAVENOUS
  Filled 2013-05-28: qty 2

## 2013-05-28 MED ORDER — PROMETHAZINE HCL 25 MG/ML IJ SOLN
6.2500 mg | Freq: Once | INTRAMUSCULAR | Status: AC
  Administered 2013-05-28: 6.25 mg via INTRAVENOUS
  Filled 2013-05-28: qty 1

## 2013-05-28 MED ORDER — PROMETHAZINE HCL 25 MG/ML IJ SOLN
6.2500 mg | Freq: Once | INTRAMUSCULAR | Status: AC
  Administered 2013-05-28: 6.25 mg via INTRAVENOUS
  Filled 2013-05-28 (×2): qty 1

## 2013-05-28 MED ORDER — DOXYCYCLINE HYCLATE 100 MG IV SOLR
100.0000 mg | Freq: Once | INTRAVENOUS | Status: AC
  Administered 2013-05-28: 100 mg via INTRAVENOUS
  Filled 2013-05-28: qty 100

## 2013-05-28 MED ORDER — PROMETHAZINE HCL 25 MG/ML IJ SOLN
12.5000 mg | Freq: Once | INTRAMUSCULAR | Status: AC
  Administered 2013-05-28: 12.5 mg via INTRAVENOUS
  Filled 2013-05-28: qty 1

## 2013-05-28 NOTE — ED Provider Notes (Signed)
Medical screening examination/treatment/procedure(s) were performed by non-physician practitioner and as supervising physician I was immediately available for consultation/collaboration.  EKG Interpretation   None         Anthem Frazer J Starlene Consuegra, MD 05/28/13 0726 

## 2013-05-28 NOTE — ED Provider Notes (Signed)
Medical screening examination/treatment/procedure(s) were performed by non-physician practitioner and as supervising physician I was immediately available for consultation/collaboration.  EKG Interpretation   None         Roney Marion, MD 05/28/13 [phone]

## 2013-05-28 NOTE — ED Notes (Signed)
Patient transported to Ultrasound 

## 2013-05-28 NOTE — ED Provider Notes (Signed)
5 PM Complains of low abdominal pain since 05/19/2013. Treated for STDs yesterday. Return if she continues to have pain and vomiting. She feels improved since treatment in the emergency department however still complains of some nausea upon sitting up. A lot of no leukocytosis no fever, negative pelvic ultrasound or feel patient can be treated as outpatient forPID  Doug Sou, MD 05/28/13 1713

## 2013-05-28 NOTE — ED Provider Notes (Signed)
CSN: [id number]     Arrival date & time 05/28/13  1127 History   First MD Initiated Contact with Patient 05/28/13 1245     Chief Complaint  Patient presents with  . Emesis  . Abdominal Pain  . Vaginal Discharge   (Consider location/radiation/quality/duration/timing/severity/associated sxs/prior Treatment) HPI Comments: The patient is a 17 year-old female with a past medical history of GC and Chlamydia, presenting the Emergency Department with a chief complaint of vomiting and abdominal pain with an onset today.  She is unable to quantify how many episodes of emesis she has had today.  She reports bilious emesis without blood or coffee grinds. She reports lower abdominal pain and low back pain rated at a 10/10.  She reports last BM today without blood or pus.  She reports a moderate amount of vaginal discharge with an onset of "several weeks ago" and has been unchanged since onset.  She reports being treated for GC and Chlamydia yesterday.  Denies vaginal bleeding. Reports dark urine today without dysuria or hematuria. No rash.    Patient is a 17 y.o. female presenting with vomiting, abdominal pain, and vaginal discharge. The history is provided by the patient.  Emesis Associated symptoms: abdominal pain   Abdominal Pain Associated symptoms: vaginal discharge and vomiting   Vaginal Discharge Associated symptoms: abdominal pain and vomiting     History reviewed. No pertinent past medical history. Past Surgical History  Procedure Laterality Date  . Tonsillectomy     History reviewed. No pertinent family history. History  Substance Use Topics  . Smoking status: Never Smoker   . Smokeless tobacco: Not on file  . Alcohol Use: Yes     Comment: occ   OB History   Grav Para Term Preterm Abortions TAB SAB Ect Mult Living                 Review of Systems  Gastrointestinal: Positive for vomiting and abdominal pain.  Genitourinary: Positive for vaginal discharge.  All other systems  reviewed and are negative.    Allergies  Other  Home Medications   Current Outpatient Rx  Name  Route  Sig  Dispense  Refill  . HYDROcodone-acetaminophen (NORCO/VICODIN) 5-325 MG per tablet   Oral   Take 1-2 tablets by mouth every 4 (four) hours as needed for moderate pain.         . medroxyPROGESTERone (DEPO-PROVERA) 150 MG/ML injection   Intramuscular   Inject 150 mg into the muscle every 3 (three) months.          BP 130/72  Pulse 86  Temp(Src) 98.5 F (36.9 C) (Oral)  Resp 16  SpO2 100%  LMP 04/29/2013 Physical Exam  Nursing note and vitals reviewed. Constitutional: She appears well-developed and well-nourished. No distress.  Actively vomiting in the ED.  HENT:  Head: Normocephalic and atraumatic.  Eyes: No scleral icterus.  Neck: Neck supple.  Cardiovascular: Normal rate and regular rhythm.   Pulmonary/Chest: Effort normal and breath sounds normal. She has no wheezes. She has no rales.  Abdominal: Soft. Bowel sounds are normal. There is tenderness in the suprapubic area. There is no rigidity, no guarding, no CVA tenderness, no tenderness at McBurney's point and negative Murphy's sign.  Mild tenderness to suprapubic region with deep palpation.   Genitourinary: Cervix exhibits motion tenderness, discharge and friability. Right adnexum displays tenderness. Left adnexum displays tenderness. Vaginal discharge found.  Right adnexa greater than left adnexa tenderness. Moderate amount of purulent discharge from the cervical os.  Erythemic cervix. Cervical os closed.  Musculoskeletal: Normal range of motion.       Lumbar back: She exhibits normal range of motion, no tenderness, no bony tenderness and no spasm.  Neurological: She is alert.  Skin: Skin is warm. There is erythema.  Psychiatric: She has a normal mood and affect.    ED Course  Procedures (including critical care time) Labs Review Labs Reviewed  WET PREP, GENITAL - Abnormal; Notable for the following:     WBC, Wet Prep HPF POC MANY (*)    All other components within normal limits  CBC WITH DIFFERENTIAL - Abnormal; Notable for the following:    Hemoglobin 16.8 (*)    HCT 46.7 (*)    MCH 34.4 (*)    Neutrophils Relative % 95 (*)    Neutro Abs 9.2 (*)    Lymphocytes Relative 4 (*)    Lymphs Abs 0.4 (*)    Monocytes Relative 1 (*)    All other components within normal limits  COMPREHENSIVE METABOLIC PANEL - Abnormal; Notable for the following:    Glucose, Bld 115 (*)    All other components within normal limits  URINALYSIS, ROUTINE W REFLEX MICROSCOPIC - Abnormal; Notable for the following:    Hgb urine dipstick SMALL (*)    Ketones, ur 15 (*)    Leukocytes, UA TRACE (*)    All other components within normal limits  GC/CHLAMYDIA PROBE AMP  PREGNANCY, URINE  URINE MICROSCOPIC-ADD ON  HIV ANTIBODY (ROUTINE TESTING)  RPR   Imaging Review US Transvaginal Non-ob  05/28/2013   CLINICAL DATA:  Vaginal discharge.  EXAM: TRANSABDOMINAL AND TRANSVAGINAL ULTRASOUND OF PELVIS with color-flow Doppler both ovaries .  TECHNIQUE: Both transabdominal and transvaginal ultrasound examinations of the pelvis were performed. Transabdominal technique was performed for global imaging of the pelvis including uterus, ovaries, adnexal regions, and pelvic cul-de-sac. It was necessary to proceed with endovaginal exam following the transabdominal exam to visualize the uterus and endometrium. Color-flow Doppler both ovaries performed.  COMPARISON:  None  FINDINGS: Uterus  Measurements: 7.3 x 2.5 x 4.7 cm. No fibroids or other mass visualized.  Endometrium  Thickness: 3.4 mm. Small amount of fluid is noted in the endometrial canal.  Right ovary  Measurements: 2.7 x 1.8 x 2.6 cm. Multiple small follicular cysts. No significant mass. Normal color flow Doppler.  Left ovary  Measurements: 2.4 x 1.8 x 1.9 cm. Multiple follicular cysts. No significant mass. Normal color flow and Doppler.  Other findings  No free fluid.   IMPRESSION: 1. Minimal amount of endometrial fluid. 2. Tiny bilateral follicular cysts.   Electronically Signed   By: Maisie Fus  Register   On: 05/28/2013 15:58   US Pelvis Complete  05/28/2013   CLINICAL DATA:  Vaginal discharge.  EXAM: TRANSABDOMINAL AND TRANSVAGINAL ULTRASOUND OF PELVIS with color-flow Doppler both ovaries .  TECHNIQUE: Both transabdominal and transvaginal ultrasound examinations of the pelvis were performed. Transabdominal technique was performed for global imaging of the pelvis including uterus, ovaries, adnexal regions, and pelvic cul-de-sac. It was necessary to proceed with endovaginal exam following the transabdominal exam to visualize the uterus and endometrium. Color-flow Doppler both ovaries performed.  COMPARISON:  None  FINDINGS: Uterus  Measurements: 7.3 x 2.5 x 4.7 cm. No fibroids or other mass visualized.  Endometrium  Thickness: 3.4 mm. Small amount of fluid is noted in the endometrial canal.  Right ovary  Measurements: 2.7 x 1.8 x 2.6 cm. Multiple small follicular cysts. No significant mass. Normal color  flow Doppler.  Left ovary  Measurements: 2.4 x 1.8 x 1.9 cm. Multiple follicular cysts. No significant mass. Normal color flow and Doppler.  Other findings  No free fluid.  IMPRESSION: 1. Minimal amount of endometrial fluid. 2. Tiny bilateral follicular cysts.   Electronically Signed   By: Maisie Fus  Register   On: 05/28/2013 15:58    EKG Interpretation   None       MDM   1. Pelvic inflammatory disease (PID)   2. Vomiting    The pt with a positive CG/Chalmydia presents with several episodes of vomiting.  EMR reveals the patient has been evaluated several times in the ED since 11/1. Labs sent. Antinausea medication given.  Discussed patient history and condition with Dr. Rennis Chris.  Will perform pelvic exam.  Patient was treated with Azithro 1 g and Rocephin 250 mg  1627-re:eval pt sleeping in room.  Denies nausea, vomiting, or pain at this time.   1715 patient is  actively vomiting in the ED. Will give more anti-nausea medication and will consult OB/GYN for admission.  IV antibiotics started.  Discussed lab results, imagine results, and treatment plan with the patient and the patient's mother.  She reports understanding and no other concerns at this time.    Discussed patient history and condition with Dr. Marice Potter who will admit the patient at [facility].   Meds given in ED:  Medications  doxycycline (VIBRAMYCIN) 100 mg in dextrose 5 % 250 mL IVPB (100 mg Intravenous New Bag/Given 05/28/13 1940)  ondansetron (ZOFRAN-ODT) disintegrating tablet 4 mg (4 mg Oral Given 05/28/13 1354)  sodium chloride 0.9 % bolus 1,000 mL (0 mLs Intravenous Stopped 05/28/13 1855)  promethazine (PHENERGAN) injection 6.25 mg (6.25 mg Intravenous Given 05/28/13 1604)  HYDROmorphone (DILAUDID) injection 0.5 mg (0.5 mg Intravenous Given 05/28/13 1508)  promethazine (PHENERGAN) injection 6.25 mg (6.25 mg Intravenous Given 05/28/13 1711)  promethazine (PHENERGAN) injection 12.5 mg (12.5 mg Intravenous Given 05/28/13 1821)  sodium chloride 0.9 % bolus 1,000 mL (1,000 mLs Intravenous New Bag/Given 05/28/13 1821)  cefOXitin (MEFOXIN) 2 g in dextrose 5 % 50 mL IVPB (0 g Intravenous Stopped 05/28/13 1940)    New Prescriptions   No medications on file      Clabe Seal, PA-C 05/28/13 2045

## 2013-05-28 NOTE — ED Notes (Signed)
Pt still in ultrasound.

## 2013-05-28 NOTE — ED Provider Notes (Signed)
Addendum patient continued to vomit after multiple treatments with antiemetics. Will call GYN service for admission for inpatient treatment for pelvic inflammatory disease due intractable vomiting  Doug Sou, MD 05/28/13 1827

## 2013-05-28 NOTE — ED Notes (Signed)
Pt c/o N/V and lower abd and back pain; pt sts vaginal discharge and recently treated for STD yesterday; pt sts not feeling better

## 2013-05-29 ENCOUNTER — Encounter (HOSPITAL_COMMUNITY): Payer: Self-pay | Admitting: *Deleted

## 2013-05-29 DIAGNOSIS — N739 Female pelvic inflammatory disease, unspecified: Secondary | ICD-10-CM

## 2013-05-29 LAB — GC/CHLAMYDIA PROBE AMP
CT Probe RNA: POSITIVE — AB
GC Probe RNA: POSITIVE — AB

## 2013-05-29 LAB — CBC
MCH: 32.6 pg (ref 26.0–34.0)
MCHC: 34.1 g/dL (ref 30.0–36.0)
MCV: 95.7 fL (ref 78.0–100.0)
Platelets: 216 10*3/uL (ref 150–400)
RDW: 11.9 % (ref 11.5–15.5)
WBC: 8.4 10*3/uL (ref 4.0–10.5)

## 2013-05-29 LAB — HIV ANTIBODY (ROUTINE TESTING W REFLEX): HIV: NONREACTIVE

## 2013-05-29 MED ORDER — METOCLOPRAMIDE HCL 5 MG/ML IJ SOLN
10.0000 mg | Freq: Four times a day (QID) | INTRAMUSCULAR | Status: DC | PRN
  Administered 2013-05-29: 10 mg via INTRAVENOUS
  Filled 2013-05-29: qty 2

## 2013-05-29 MED ORDER — DEXTROSE IN LACTATED RINGERS 5 % IV SOLN
INTRAVENOUS | Status: DC
  Administered 2013-05-29: 02:00:00 via INTRAVENOUS

## 2013-05-29 MED ORDER — DEXTROSE 5 % IV SOLN
1.0000 g | Freq: Four times a day (QID) | INTRAVENOUS | Status: DC
  Administered 2013-05-29 (×2): 1 g via INTRAVENOUS
  Filled 2013-05-29 (×3): qty 1

## 2013-05-29 MED ORDER — OXYCODONE-ACETAMINOPHEN 5-325 MG PO TABS
1.0000 | ORAL_TABLET | ORAL | Status: DC | PRN
  Administered 2013-05-29: 2 via ORAL
  Filled 2013-05-29: qty 2

## 2013-05-29 MED ORDER — IBUPROFEN 800 MG PO TABS: 800.0000 mg | ORAL_TABLET | Freq: Three times a day (TID) | ORAL | Status: DC | PRN

## 2013-05-29 MED ORDER — ZOLPIDEM TARTRATE 5 MG PO TABS: 5.0000 mg | ORAL_TABLET | Freq: Every evening | ORAL | Status: DC | PRN

## 2013-05-29 MED ORDER — ONDANSETRON HCL 4 MG/2ML IJ SOLN: 4.0000 mg | Freq: Four times a day (QID) | INTRAMUSCULAR | Status: DC | PRN

## 2013-05-29 MED ORDER — ONDANSETRON HCL 4 MG PO TABS
4.0000 mg | ORAL_TABLET | Freq: Once | ORAL | Status: AC
  Administered 2013-05-29: 4 mg via ORAL

## 2013-05-29 MED ORDER — DOXYCYCLINE HYCLATE 100 MG PO CAPS: 100.0000 mg | ORAL_CAPSULE | Freq: Two times a day (BID) | ORAL | Status: DC

## 2013-05-29 MED ORDER — HYDROMORPHONE HCL PF 1 MG/ML IJ SOLN
0.2000 mg | INTRAMUSCULAR | Status: DC | PRN
  Administered 2013-05-29: 0.6 mg via INTRAVENOUS
  Filled 2013-05-29: qty 1

## 2013-05-29 MED ORDER — DOXYCYCLINE HYCLATE 100 MG IV SOLR
100.0000 mg | Freq: Two times a day (BID) | INTRAVENOUS | Status: DC
  Administered 2013-05-29: 100 mg via INTRAVENOUS
  Filled 2013-05-29: qty 100

## 2013-05-29 NOTE — ED Provider Notes (Signed)
Medical screening examination/treatment/procedure(s) were conducted as a shared visit with non-physician practitioner(s) and myself.  I personally evaluated the patient during the encounter.  EKG Interpretation     Ventricular Rate:    PR Interval:    QRS Duration:   QT Interval:    QTC Calculation:   R Axis:     Text Interpretation:               Doug Sou, MD 05/29/13 1643

## 2013-05-29 NOTE — Progress Notes (Signed)
Patient ID: Angela Christian, female   DOB: 17-May-1996, 17 y.o.   MRN: [id number] HD #1  S. Tolerating crackers this morning with no vomitting. O. VSS, AF Heart- rrr Lungs- CTAB Abd- benign Back- no cvat  A/P. N/V presumably from PID (? Because no WBC, no fever)- improving. She will be discharged home later today.

## 2013-05-29 NOTE — Progress Notes (Signed)
Pt discharged to home with mother.  Condition stable.  Pt ambulated to home with L. Ilsa Iha, NT.  No equipment for home ordered at discharge.

## 2013-05-29 NOTE — H&P (Signed)
Angela Christian is an 17 y.o. female with hx of GC/C who presented to the ED 11/10 with bilious emesis and abdominal discomfort. Of note pt was treated for gonorrhea and chlamydia with rocephin and azithro on 11/09 and advised to refrain from sexual activity. Since that time had worsening abdominal discomfort rating 10/10 in suprapubic region and lower back which prompted her to return to the ED. Pt describes pain as constant no radiating, worse on the right lower back than left. Moderately relieved by Vicodin. Additionally has been unable to keep anything down. Took small sips of water today but was unable to tolerate gingerale in the ED. Emesis is described yellow-green tinged. Unable to quantify amount. Had 2 soft brown formed bowel movements today. No fevers, but does attest to some chills and general malaise. Also with continued yellow foul smelling vaginal discharge, unchanged in quantity or quality for the past few days.  In the ED pt noted to be NR HIV and NR RPR. No evidence of leukocytosis. CMET wnl. Had neg pelvic US. Minimal amt of endometrial fluid and tiny bilat follicular cysts. Pt with one episode of urinary incontinence there. Given fluids but cont to vomit after mult tx with antiemetics. Was transferred here for inpt tx of PID 2/2 emesis.   Pertinent Gynecological History: Menses: flow is moderate Sexually transmitted diseases: past hx of gc/c 11/1 Last pap: n/a OB History: G0, P0   Menstrual History: Menarche age: age 30 Patient's last menstrual period was 04/29/2013.    History reviewed. No pertinent past medical history.  Past Surgical History  Procedure Laterality Date  . Tonsillectomy      History reviewed. No pertinent family history.  Social History:  reports that she has never smoked. She does not have any smokeless tobacco history on file. She reports that she drinks alcohol. She reports that she uses illicit drugs (Marijuana).  Allergies:  Allergies  Allergen  Reactions  . Other Nausea And Vomiting    Marijuana  . Zofran [Ondansetron]     Prescriptions prior to admission  Medication Sig Dispense Refill  . HYDROcodone-acetaminophen (NORCO/VICODIN) 5-325 MG per tablet Take 1-2 tablets by mouth every 4 (four) hours as needed for moderate pain.      . medroxyPROGESTERone (DEPO-PROVERA) 150 MG/ML injection Inject 150 mg into the muscle every 3 (three) months.        Review of Systems  Constitutional: Positive for chills and malaise/fatigue. Negative for fever and diaphoresis.  HENT: Negative for ear discharge.   Eyes: Negative for blurred vision and pain.  Respiratory: Negative for cough, sputum production and shortness of breath.   Cardiovascular: Negative for chest pain, palpitations and leg swelling.  Gastrointestinal: Positive for nausea, vomiting and abdominal pain. Negative for heartburn, diarrhea, blood in stool and melena.  Genitourinary: Positive for urgency and flank pain. Negative for dysuria, frequency and hematuria.  Musculoskeletal: Positive for back pain. Negative for myalgias.  Neurological: Negative for dizziness, seizures, loss of consciousness and headaches.  Endo/Heme/Allergies: Does not bruise/bleed easily.  Psychiatric/Behavioral: Negative for depression.    Blood pressure 114/68, pulse 86, temperature 97.8 F (36.6 C), temperature source Oral, resp. rate 18, last menstrual period 04/29/2013, SpO2 100.00%. Physical Exam  Constitutional: She is oriented to person, place, and time. She appears well-developed and well-nourished. No distress.  In apparent discomfort  HENT:  Head: Normocephalic and atraumatic.  Mouth/Throat: No oropharyngeal exudate.  Dry mucus membranes  Eyes: EOM are normal. Pupils are equal, round, and reactive to  light.  Neck: Neck supple.  Cardiovascular: Normal rate, regular rhythm and normal heart sounds.   No murmur heard. Cap refill 4 sec   Respiratory: Effort normal and breath sounds normal.  No respiratory distress.  GI: Soft. Bowel sounds are normal. She exhibits no distension and no mass. There is tenderness. There is no rebound and no guarding.  Suprapubic tenderness, greater R>L  Musculoskeletal: Normal range of motion. She exhibits no edema.  CVA tenderness bilaterally, no spinous tenderness  Lymphadenopathy:    She has no cervical adenopathy.  Neurological: She is alert and oriented to person, place, and time. She has normal reflexes. She exhibits normal muscle tone.  Skin: Skin is warm and dry. No erythema.    Results for orders placed during the hospital encounter of 05/28/13 (from the past 24 hour(s))  WET PREP, GENITAL     Status: Abnormal   Collection Time    05/28/13  2:34 PM      Result Value Range   Yeast Wet Prep HPF POC NONE SEEN  NONE SEEN   Trich, Wet Prep NONE SEEN  NONE SEEN   Clue Cells Wet Prep HPF POC NONE SEEN  NONE SEEN   WBC, Wet Prep HPF POC MANY (*) NONE SEEN  URINALYSIS, ROUTINE W REFLEX MICROSCOPIC     Status: Abnormal   Collection Time    05/28/13  2:38 PM      Result Value Range   Color, Urine YELLOW  YELLOW   APPearance CLEAR  CLEAR   Specific Gravity, Urine 1.023  1.005 - 1.030   pH 8.0  5.0 - 8.0   Glucose, UA NEGATIVE  NEGATIVE mg/dL   Hgb urine dipstick SMALL (*) NEGATIVE   Bilirubin Urine NEGATIVE  NEGATIVE   Ketones, ur 15 (*) NEGATIVE mg/dL   Protein, ur NEGATIVE  NEGATIVE mg/dL   Urobilinogen, UA 0.2  0.0 - 1.0 mg/dL   Nitrite NEGATIVE  NEGATIVE   Leukocytes, UA TRACE (*) NEGATIVE  PREGNANCY, URINE     Status: None   Collection Time    05/28/13  2:38 PM      Result Value Range   Preg Test, Ur NEGATIVE  NEGATIVE  URINE MICROSCOPIC-ADD ON     Status: None   Collection Time    05/28/13  2:38 PM      Result Value Range   Squamous Epithelial / LPF RARE  RARE   WBC, UA 0-2  <3 WBC/hpf   RBC / HPF 3-6  <3 RBC/hpf   Bacteria, UA RARE  RARE  CBC WITH DIFFERENTIAL     Status: Abnormal   Collection Time    05/28/13  2:53 PM       Result Value Range   WBC 9.7  4.0 - 10.5 K/uL   RBC 4.88  3.87 - 5.11 MIL/uL   Hemoglobin 16.8 (*) 12.0 - 15.0 g/dL   HCT 96.2 (*) 95.2 - 84.1 %   MCV 95.7  78.0 - 100.0 fL   MCH 34.4 (*) 26.0 - 34.0 pg   MCHC 36.0  30.0 - 36.0 g/dL   RDW 32.4  40.1 - 02.7 %   Platelets 241  150 - 400 K/uL   Neutrophils Relative % 95 (*) 43 - 77 %   Neutro Abs 9.2 (*) 1.7 - 7.7 K/uL   Lymphocytes Relative 4 (*) 12 - 46 %   Lymphs Abs 0.4 (*) 0.7 - 4.0 K/uL   Monocytes Relative 1 (*) 3 - 12 %  Monocytes Absolute 0.1  0.1 - 1.0 K/uL   Eosinophils Relative 0  0 - 5 %   Eosinophils Absolute 0.0  0.0 - 0.7 K/uL   Basophils Relative 0  0 - 1 %   Basophils Absolute 0.0  0.0 - 0.1 K/uL  COMPREHENSIVE METABOLIC PANEL     Status: Abnormal   Collection Time    05/28/13  2:53 PM      Result Value Range   Sodium 137  135 - 145 mEq/L   Potassium 4.0  3.5 - 5.1 mEq/L   Chloride 101  96 - 112 mEq/L   CO2 22  19 - 32 mEq/L   Glucose, Bld 115 (*) 70 - 99 mg/dL   BUN 9  6 - 23 mg/dL   Creatinine, Ser 4.09  0.50 - 1.10 mg/dL   Calcium 81.1  8.4 - 91.4 mg/dL   Total Protein 8.3  6.0 - 8.3 g/dL   Albumin 4.8  3.5 - 5.2 g/dL   AST 20  0 - 37 U/L   ALT 15  0 - 35 U/L   Alkaline Phosphatase 62  39 - 117 U/L   Total Bilirubin 1.1  0.3 - 1.2 mg/dL   GFR calc non Af Amer >90  >90 mL/min   GFR calc Af Amer >90  >90 mL/min  HIV ANTIBODY (ROUTINE TESTING)     Status: None   Collection Time    05/28/13  2:53 PM      Result Value Range   HIV NON REACTIVE  NON REACTIVE  RPR     Status: None   Collection Time    05/28/13  2:53 PM      Result Value Range   RPR NON REACTIVE  NON REACTIVE    US Transvaginal Non-ob  05/28/2013   CLINICAL DATA:  Vaginal discharge.  EXAM: TRANSABDOMINAL AND TRANSVAGINAL ULTRASOUND OF PELVIS with color-flow Doppler both ovaries .  TECHNIQUE: Both transabdominal and transvaginal ultrasound examinations of the pelvis were performed. Transabdominal technique was performed for global  imaging of the pelvis including uterus, ovaries, adnexal regions, and pelvic cul-de-sac. It was necessary to proceed with endovaginal exam following the transabdominal exam to visualize the uterus and endometrium. Color-flow Doppler both ovaries performed.  COMPARISON:  None  FINDINGS: Uterus  Measurements: 7.3 x 2.5 x 4.7 cm. No fibroids or other mass visualized.  Endometrium  Thickness: 3.4 mm. Small amount of fluid is noted in the endometrial canal.  Right ovary  Measurements: 2.7 x 1.8 x 2.6 cm. Multiple small follicular cysts. No significant mass. Normal color flow Doppler.  Left ovary  Measurements: 2.4 x 1.8 x 1.9 cm. Multiple follicular cysts. No significant mass. Normal color flow and Doppler.  Other findings  No free fluid.  IMPRESSION: 1. Minimal amount of endometrial fluid. 2. Tiny bilateral follicular cysts.   Electronically Signed   By: Maisie Fus  Register   On: 05/28/2013 15:58   US Pelvis Complete  05/28/2013   CLINICAL DATA:  Vaginal discharge.  EXAM: TRANSABDOMINAL AND TRANSVAGINAL ULTRASOUND OF PELVIS with color-flow Doppler both ovaries .  TECHNIQUE: Both transabdominal and transvaginal ultrasound examinations of the pelvis were performed. Transabdominal technique was performed for global imaging of the pelvis including uterus, ovaries, adnexal regions, and pelvic cul-de-sac. It was necessary to proceed with endovaginal exam following the transabdominal exam to visualize the uterus and endometrium. Color-flow Doppler both ovaries performed.  COMPARISON:  None  FINDINGS: Uterus  Measurements: 7.3 x 2.5 x 4.7 cm. No  fibroids or other mass visualized.  Endometrium  Thickness: 3.4 mm. Small amount of fluid is noted in the endometrial canal.  Right ovary  Measurements: 2.7 x 1.8 x 2.6 cm. Multiple small follicular cysts. No significant mass. Normal color flow Doppler.  Left ovary  Measurements: 2.4 x 1.8 x 1.9 cm. Multiple follicular cysts. No significant mass. Normal color flow and Doppler.  Other  findings  No free fluid.  IMPRESSION: 1. Minimal amount of endometrial fluid. 2. Tiny bilateral follicular cysts.   Electronically Signed   By: Maisie Fus  Register   On: 05/28/2013 15:58    Assessment/Plan: Ms Brandau is a 17y.o G0 with hx of gc/c documented on 11/1 who presents after tx 11/9 with continued sx and intractable emesis.   1. GC/C- still with copious vag dx and now signs of PID. Currently afebrile without leukocytosis and neg pelvic US but vomiting concerning for worsening infection. S/p azithro and rocephin 11/9. Pain worse in lower back, particularly on right> left. Had one episode of incontinence in the ED yesterday but U/A without evidence of UTI/pyelo.  -cefoxitin 2gm loading with 1g q6 plus dox 100mg  loading with 100 mg q12 -IVF at 125, NPO with small ice chips  -IV zofran, reglan prn  -cbc -advance diet as tolerated  -serial abdominal exams -dilaudid prn q2, will transition to oral pain meds with toleration of fluids inc ibuprofen and percocet  2. Dispo- pending improvement of emesis, toleration of fluids, advancement of diet and transition of meds to orals   Charlane Ferretti, MD Family Medicine PGY-1 Please page or call with questions   Anselm Lis 05/29/2013, 1:36 AM

## 2013-05-29 NOTE — ED Notes (Signed)
Verbal consent obtain

## 2013-05-29 NOTE — ED Notes (Signed)
Transported by [facility]

## 2013-05-29 NOTE — Discharge Summary (Signed)
Physician Discharge Summary  Patient ID: Angela Christian MRN: [id number] DOB/AGE: [birth year] 19 y.o.  Admit date: 05/28/2013 Discharge date: 05/29/2013  Admission Diagnoses: Nausea/vomitting presumably from PID  Discharge Diagnoses: same Active Problems:   * No active hospital problems. *   Discharged Condition: good  Hospital Course: She was observed overnight and given IV fluids. Her nausea/vomitting resolved and she was able to tolerate po. She was voiding and her pelvic discomfort decreased. We had a thorough discussion about PID and STI protection.  Consults: None  Significant Diagnostic Studies: ultrasound was normal  Treatments: IV hydration  Discharge Exam: Blood pressure 93/62, pulse 52, temperature 98.4 F (36.9 C), temperature source Oral, resp. rate 16, height 5' 4.5" (1.638 m), weight 48.988 kg (108 lb), last menstrual period 04/29/2013, SpO2 98.00%. General appearance: alert Resp: clear to auscultation bilaterally Cardio: regular rate and rhythm, S1, S2 normal, no murmur, click, rub or gallop GI: soft, non-tender; bowel sounds normal; no masses,  no organomegaly, benign abdomen No CVAT  Disposition: 01-Home or Self Care     Medication List         doxycycline 100 MG capsule  Commonly known as:  VIBRAMYCIN  Take 1 capsule (100 mg total) by mouth 2 (two) times daily.     HYDROcodone-acetaminophen 5-325 MG per tablet  Commonly known as:  NORCO/VICODIN  Take 1-2 tablets by mouth every 4 (four) hours as needed for moderate pain.     ibuprofen 800 MG tablet  Commonly known as:  ADVIL,MOTRIN  Take 1 tablet (800 mg total) by mouth every 8 (eight) hours as needed (mild pain).     medroxyPROGESTERone 150 MG/ML injection  Commonly known as:  DEPO-PROVERA  Inject 150 mg into the muscle every 3 (three) months.           Follow-up Information   Schedule an appointment as soon as possible for a visit with Hollice Espy, MD.   Specialty:  Family  Medicine   Contact information:   [address] [place] [address] [phone]       Follow up with [facility]. Schedule an appointment as soon as possible for a visit in 4 weeks.   Contact information:   [address] [place] [address] [phone]      Signed: Allie Bossier. 05/29/2013, 7:09 AM

## 2013-05-30 NOTE — ED Provider Notes (Signed)
Medical screening examination/treatment/procedure(s) were performed by non-physician practitioner and as supervising physician I was immediately available for consultation/collaboration.  Christopher J. Pollina, MD 05/30/13 1712 

## 2013-06-07 ENCOUNTER — Other Ambulatory Visit: Payer: Self-pay | Admitting: Family Medicine

## 2013-06-07 DIAGNOSIS — R1110 Vomiting, unspecified: Secondary | ICD-10-CM

## 2013-06-08 ENCOUNTER — Emergency Department (HOSPITAL_COMMUNITY)
Admission: EM | Admit: 2013-06-08 | Discharge: 2013-06-08 | Disposition: A | Discharge status: Home / Self Care | Payer: BC Managed Care – PPO | Attending: Emergency Medicine | Admitting: Emergency Medicine

## 2013-06-08 ENCOUNTER — Emergency Department (HOSPITAL_COMMUNITY): Payer: BC Managed Care – PPO

## 2013-06-08 ENCOUNTER — Encounter (HOSPITAL_COMMUNITY): Payer: Self-pay | Admitting: Emergency Medicine

## 2013-06-08 DIAGNOSIS — Z79899 Other long term (current) drug therapy: Secondary | ICD-10-CM | POA: Insufficient documentation

## 2013-06-08 DIAGNOSIS — R6883 Chills (without fever): Secondary | ICD-10-CM | POA: Insufficient documentation

## 2013-06-08 DIAGNOSIS — M549 Dorsalgia, unspecified: Secondary | ICD-10-CM | POA: Insufficient documentation

## 2013-06-08 DIAGNOSIS — R51 Headache: Secondary | ICD-10-CM | POA: Insufficient documentation

## 2013-06-08 DIAGNOSIS — R109 Unspecified abdominal pain: Secondary | ICD-10-CM | POA: Insufficient documentation

## 2013-06-08 DIAGNOSIS — Z8619 Personal history of other infectious and parasitic diseases: Secondary | ICD-10-CM | POA: Insufficient documentation

## 2013-06-08 DIAGNOSIS — R112 Nausea with vomiting, unspecified: Secondary | ICD-10-CM | POA: Insufficient documentation

## 2013-06-08 DIAGNOSIS — Z3202 Encounter for pregnancy test, result negative: Secondary | ICD-10-CM | POA: Insufficient documentation

## 2013-06-08 LAB — POCT PREGNANCY, URINE: Preg Test, Ur: NEGATIVE

## 2013-06-08 LAB — CBC WITH DIFFERENTIAL/PLATELET
Basophils Relative: 1 % (ref 0–1)
HCT: 49.1 % — ABNORMAL HIGH (ref 36.0–46.0)
Hemoglobin: 17.3 g/dL — ABNORMAL HIGH (ref 12.0–15.0)
Lymphs Abs: 1.6 10*3/uL (ref 0.7–4.0)
MCHC: 35.2 g/dL (ref 30.0–36.0)
Monocytes Absolute: 0.6 10*3/uL (ref 0.1–1.0)
Monocytes Relative: 5 % (ref 3–12)
Neutro Abs: 9.4 10*3/uL — ABNORMAL HIGH (ref 1.7–7.7)
Neutrophils Relative %: 80 % — ABNORMAL HIGH (ref 43–77)
Platelets: 295 10*3/uL (ref 150–400)
RBC: 5.13 MIL/uL — ABNORMAL HIGH (ref 3.87–5.11)
WBC: 11.7 10*3/uL — ABNORMAL HIGH (ref 4.0–10.5)

## 2013-06-08 LAB — URINALYSIS, ROUTINE W REFLEX MICROSCOPIC
Glucose, UA: NEGATIVE mg/dL
Ketones, ur: >80 mg/dL — AB
Protein, ur: 100 mg/dL — AB
Urobilinogen, UA: 1 mg/dL (ref 0.0–1.0)
pH: 6.5 (ref 5.0–8.0)

## 2013-06-08 LAB — URINE MICROSCOPIC-ADD ON

## 2013-06-08 LAB — COMPREHENSIVE METABOLIC PANEL
AST: 47 U/L — ABNORMAL HIGH (ref 0–37)
BUN: 16 mg/dL (ref 6–23)
CO2: 19 mEq/L (ref 19–32)
Calcium: 10.7 mg/dL — ABNORMAL HIGH (ref 8.4–10.5)
Creatinine, Ser: 1.14 mg/dL — ABNORMAL HIGH (ref 0.50–1.10)
GFR calc Af Amer: 80 mL/min — ABNORMAL LOW (ref >90)
GFR calc non Af Amer: 69 mL/min — ABNORMAL LOW (ref >90)
Glucose, Bld: 84 mg/dL (ref 70–99)
Total Bilirubin: 1.6 mg/dL — ABNORMAL HIGH (ref 0.3–1.2)
Total Protein: 8.4 g/dL — ABNORMAL HIGH (ref 6.0–8.3)

## 2013-06-08 LAB — RAPID URINE DRUG SCREEN, HOSP PERFORMED
Amphetamines: NOT DETECTED
Barbiturates: NOT DETECTED
Opiates: NOT DETECTED
Tetrahydrocannabinol: POSITIVE — AB

## 2013-06-08 MED ORDER — SODIUM CHLORIDE 0.9 % IV BOLUS (SEPSIS)
1000.0000 mL | Freq: Once | INTRAVENOUS | Status: AC
  Administered 2013-06-08: 1000 mL via INTRAVENOUS

## 2013-06-08 MED ORDER — IOHEXOL 300 MG/ML  SOLN
80.0000 mL | Freq: Once | INTRAMUSCULAR | Status: AC | PRN
  Administered 2013-06-08: 80 mL via INTRAVENOUS

## 2013-06-08 MED ORDER — GI COCKTAIL ~~LOC~~
30.0000 mL | Freq: Once | ORAL | Status: AC
  Administered 2013-06-08: 30 mL via ORAL
  Filled 2013-06-08: qty 30

## 2013-06-08 MED ORDER — MORPHINE SULFATE 4 MG/ML IJ SOLN
4.0000 mg | Freq: Once | INTRAMUSCULAR | Status: AC
  Administered 2013-06-08: 4 mg via INTRAVENOUS
  Filled 2013-06-08: qty 1

## 2013-06-08 MED ORDER — ACETAMINOPHEN 325 MG PO TABS
650.0000 mg | ORAL_TABLET | Freq: Once | ORAL | Status: AC
  Administered 2013-06-08: 650 mg via ORAL
  Filled 2013-06-08: qty 2

## 2013-06-08 MED ORDER — ONDANSETRON HCL 4 MG/2ML IJ SOLN
4.0000 mg | Freq: Once | INTRAMUSCULAR | Status: AC
  Administered 2013-06-08: 4 mg via INTRAVENOUS
  Filled 2013-06-08: qty 2

## 2013-06-08 MED ORDER — ONDANSETRON 8 MG PO TBDP: 8.0000 mg | ORAL_TABLET | Freq: Three times a day (TID) | ORAL | Status: DC | PRN

## 2013-06-08 MED ORDER — IOHEXOL 300 MG/ML  SOLN
25.0000 mL | INTRAMUSCULAR | Status: AC
  Administered 2013-06-08: 25 mL via ORAL

## 2013-06-08 NOTE — ED Provider Notes (Signed)
CSN: [id number]     Arrival date & time 06/08/13  1449 History   First MD Initiated Contact with Patient 06/08/13 1740     Chief Complaint  Patient presents with  . Emesis   (Consider location/radiation/quality/duration/timing/severity/associated sxs/prior Treatment) HPI Comments: Patient is otherwise healthy 17 year old female who presents to the ED with continued vomiting over the past 3 days.  The vomiting initially started in June of this year where she was admitted to the hospital for the same, over the month of November she has had almost daily vomiting with multiple visits to both the ED and her PCP, Dr. Shaune Pollack.  Her mother reports that she has been trying the zofran at home, has been given injections of phenergan as well.  The patient has daily headaches as well as is scheduled for MRI of the brain tomorrow to evaluate the headaches and the vomiting.  She has had gastric motility studies and ultrasounds which also were normal.  Mother reports that the patient has had an 8 pound weight loss (to 104 pounds) in this past month.  She was noted earlier in the month to have tested positive for gonorrhea and chlamydia, has been treated for this and on the 11th was admitted at [facility] for PID with intractible vomiting.  The patient denies fever, but reports chills, complains of bilateral back and flank pain, and left mid abdominal pain.  She reports no hemoptysis, no BM x 3 days.  Reports only intake this week is crackers and soda.  Patient is a 17 y.o. female presenting with vomiting. The history is provided by the patient and a parent. No language interpreter was used.  Emesis Severity:  Severe Duration:  1 month Timing:  Intermittent Number of daily episodes:  5-6 Quality:  Unable to specify How soon after eating does vomiting occur:  30 seconds Progression:  Worsening Chronicity:  Chronic Recent urination:  Decreased Relieved by:  Nothing Worsened by:  Nothing  tried Ineffective treatments:  Antiemetics Associated symptoms: abdominal pain, chills and headaches   Associated symptoms: no cough, no diarrhea, no fever, no myalgias, no sore throat and no URI   Risk factors: no diabetes, not pregnant now, no prior abdominal surgery, no sick contacts and no travel to endemic areas     History reviewed. No pertinent past medical history. Past Surgical History  Procedure Laterality Date  . Tonsillectomy     History reviewed. No pertinent family history. History  Substance Use Topics  . Smoking status: Never Smoker   . Smokeless tobacco: Not on file  . Alcohol Use: Yes     Comment: occ   OB History   Grav Para Term Preterm Abortions TAB SAB Ect Mult Living                 Review of Systems  Constitutional: Positive for chills.  HENT: Negative for sore throat.   Gastrointestinal: Positive for vomiting and abdominal pain. Negative for diarrhea.  Musculoskeletal: Negative for myalgias.  Neurological: Positive for headaches.  All other systems reviewed and are negative.    Allergies  Other  Home Medications   Current Outpatient Rx  Name  Route  Sig  Dispense  Refill  . LORazepam (ATIVAN) 0.5 MG tablet   Oral   Take 0.5 mg by mouth every 8 (eight) hours as needed for anxiety.         . medroxyPROGESTERone (DEPO-PROVERA) 150 MG/ML injection   Intramuscular   Inject 150 mg  into the muscle every 3 (three) months.         . ondansetron (ZOFRAN) 4 MG tablet   Oral   Take 4 mg by mouth every 6 (six) hours as needed for nausea or vomiting.         . ondansetron (ZOFRAN-ODT) 8 MG disintegrating tablet   Oral   Take 8 mg by mouth every 8 (eight) hours as needed for nausea or vomiting.         . promethazine (PHENERGAN) 25 MG tablet   Oral   Take 25 mg by mouth every 6 (six) hours as needed for nausea or vomiting.         Marland Kitchen doxycycline (VIBRAMYCIN) 100 MG capsule   Oral   Take 1 capsule (100 mg total) by mouth 2 (two) times  daily.   14 capsule   0   . ibuprofen (ADVIL,MOTRIN) 800 MG tablet   Oral   Take 1 tablet (800 mg total) by mouth every 8 (eight) hours as needed (mild pain).   30 tablet   0    BP 153/93  Pulse 87  Temp(Src) 98.2 F (36.8 C) (Oral)  Resp 16  Ht 5\' 4"  (1.626 m)  Wt 104 lb (47.174 kg)  BMI 17.84 kg/m2  SpO2 97%  LMP 04/29/2013 Physical Exam  Nursing note and vitals reviewed. Constitutional: She is oriented to person, place, and time. She appears well-developed and well-nourished.  Ill appearing  HENT:  Head: Normocephalic and atraumatic.  Right Ear: External ear normal.  Left Ear: External ear normal.  Nose: Nose normal.  Mouth/Throat: No oropharyngeal exudate.  Dry oral mucosa  Eyes: Conjunctivae are normal. Pupils are equal, round, and reactive to light. No scleral icterus.  Neck: Normal range of motion. Neck supple.  Cardiovascular: Regular rhythm and normal heart sounds.  Exam reveals no gallop and no friction rub.   No murmur heard. tachycardia  Pulmonary/Chest: Effort normal and breath sounds normal. No respiratory distress. She has no wheezes. She has no rales. She exhibits no tenderness.  Abdominal: Soft. Bowel sounds are normal. She exhibits no distension. There is tenderness in the left upper quadrant. There is no rebound and no guarding.    Musculoskeletal: Normal range of motion. She exhibits no edema and no tenderness.  Lymphadenopathy:    She has no cervical adenopathy.  Neurological: She is alert and oriented to person, place, and time. She exhibits normal muscle tone. Coordination normal.  Skin: Skin is warm and dry. No rash noted. No erythema. No pallor.  Psychiatric: She has a normal mood and affect. Her behavior is normal. Judgment and thought content normal.    ED Course  Procedures (including critical care time) Labs Review Labs Reviewed  CBC WITH DIFFERENTIAL  COMPREHENSIVE METABOLIC PANEL  LIPASE, BLOOD  URINALYSIS, ROUTINE W REFLEX  MICROSCOPIC  URINE RAPID DRUG SCREEN (HOSP PERFORMED)  POCT PREGNANCY, URINE   Imaging Review No results found.  EKG Interpretation   None     7:29 PM Nausea and pain better after morphine and zofran, drinking contrast, mother remains at bedside.  8:13 PM UDS positive for THC, discussed with patient her marijuana use, she reports that she uses periodically, about once a week, that her last use was yesterday because of the severe nausea and that this did not help.  I explained to the patient about cyclic vomiting syndrome related to [facility] use but she states that this has been mentioned to her before but she does not  believe this is the cause of her vomiting.  She denies any other illicit drug use, reports continued nausea but denies pain at this time.  Care of patient turned over to G. Veatrice Kells, NP.  MDM  No diagnosis found.    Izola Price Marisue Humble, PA-C 06/08/13 2016

## 2013-06-08 NOTE — ED Notes (Signed)
PA at bedside.

## 2013-06-08 NOTE — ED Notes (Signed)
Patient states she is feeling a little Nauseous.

## 2013-06-08 NOTE — ED Notes (Signed)
Pt having vomiting for 3 days after taking abx for pelvic infection. sts she is anxious and is out of medication.

## 2013-06-08 NOTE — ED Provider Notes (Signed)
CT scan is normal.  Patient is tolerating by mouth's she will be discharged him with a renewal of her prescription for Zofran, and has been instructed to followup with Dr. Kevan Ny.  Next week  Arman Filter, NP 06/08/13 2210

## 2013-06-09 ENCOUNTER — Ambulatory Visit
Admission: RE | Admit: 2013-06-09 | Discharge: 2013-06-09 | Disposition: A | Discharge status: Home / Self Care | Payer: BC Managed Care – PPO | Source: Ambulatory Visit | Attending: Family Medicine | Admitting: Family Medicine

## 2013-06-09 DIAGNOSIS — R1110 Vomiting, unspecified: Secondary | ICD-10-CM

## 2013-06-09 MED ORDER — GADOBENATE DIMEGLUMINE 529 MG/ML IV SOLN
9.0000 mL | Freq: Once | INTRAVENOUS | Status: AC | PRN
  Administered 2013-06-09: 9 mL via INTRAVENOUS

## 2013-06-10 LAB — URINE CULTURE
Colony Count: NO GROWTH
Culture: NO GROWTH

## 2013-06-16 NOTE — ED Provider Notes (Signed)
Medical screening examination/treatment/procedure(s) were performed by non-physician practitioner and as supervising physician I was immediately available for consultation/collaboration.    Nelia Shi, MD 06/16/13 [phone]

## 2013-10-20 ENCOUNTER — Emergency Department (HOSPITAL_COMMUNITY)
Admission: EM | Admit: 2013-10-20 | Discharge: 2013-10-20 | Disposition: A | Discharge status: Home / Self Care | Payer: BC Managed Care – PPO | Attending: Emergency Medicine | Admitting: Emergency Medicine

## 2013-10-20 ENCOUNTER — Encounter (HOSPITAL_COMMUNITY): Payer: Self-pay | Admitting: Emergency Medicine

## 2013-10-20 DIAGNOSIS — R112 Nausea with vomiting, unspecified: Secondary | ICD-10-CM | POA: Insufficient documentation

## 2013-10-20 DIAGNOSIS — F1210 Cannabis abuse, uncomplicated: Secondary | ICD-10-CM | POA: Insufficient documentation

## 2013-10-20 DIAGNOSIS — Z79899 Other long term (current) drug therapy: Secondary | ICD-10-CM | POA: Insufficient documentation

## 2013-10-20 DIAGNOSIS — F411 Generalized anxiety disorder: Secondary | ICD-10-CM | POA: Insufficient documentation

## 2013-10-20 HISTORY — DX: Anxiety disorder, unspecified: F41.9

## 2013-10-20 LAB — COMPREHENSIVE METABOLIC PANEL
ALBUMIN: 4.5 g/dL (ref 3.5–5.2)
ALT: 19 U/L (ref 0–35)
AST: 25 U/L (ref 0–37)
Alkaline Phosphatase: 66 U/L (ref 39–117)
BUN: 15 mg/dL (ref 6–23)
CO2: 18 mEq/L — ABNORMAL LOW (ref 19–32)
CREATININE: 0.73 mg/dL (ref 0.50–1.10)
Calcium: 10.1 mg/dL (ref 8.4–10.5)
Chloride: 102 mEq/L (ref 96–112)
GFR calc Af Amer: >90 mL/min (ref >90)
GFR calc non Af Amer: >90 mL/min (ref >90)
Glucose, Bld: 139 mg/dL — ABNORMAL HIGH (ref 70–99)
Potassium: 3.8 mEq/L (ref 3.7–5.3)
Sodium: 140 mEq/L (ref 137–147)
TOTAL PROTEIN: 7.9 g/dL (ref 6.0–8.3)
Total Bilirubin: 1.1 mg/dL (ref 0.3–1.2)

## 2013-10-20 LAB — URINALYSIS, ROUTINE W REFLEX MICROSCOPIC
BILIRUBIN URINE: NEGATIVE
Glucose, UA: 100 mg/dL — AB
Hgb urine dipstick: NEGATIVE
Ketones, ur: >80 mg/dL — AB
NITRITE: NEGATIVE
Protein, ur: 30 mg/dL — AB
Specific Gravity, Urine: 1.031 — ABNORMAL HIGH (ref 1.005–1.030)
UROBILINOGEN UA: 1 mg/dL (ref 0.0–1.0)
pH: 7.5 (ref 5.0–8.0)

## 2013-10-20 LAB — URINE MICROSCOPIC-ADD ON

## 2013-10-20 LAB — RAPID URINE DRUG SCREEN, HOSP PERFORMED
Amphetamines: NOT DETECTED
BARBITURATES: NOT DETECTED
Benzodiazepines: NOT DETECTED
COCAINE: NOT DETECTED
OPIATES: NOT DETECTED
Tetrahydrocannabinol: POSITIVE — AB

## 2013-10-20 LAB — CBC WITH DIFFERENTIAL/PLATELET
BASOS PCT: 0 % (ref 0–1)
Basophils Absolute: 0 10*3/uL (ref 0.0–0.1)
EOS ABS: 0 10*3/uL (ref 0.0–0.7)
Eosinophils Relative: 0 % (ref 0–5)
HEMATOCRIT: 45.5 % (ref 36.0–46.0)
HEMOGLOBIN: 16.2 g/dL — AB (ref 12.0–15.0)
Lymphocytes Relative: 7 % — ABNORMAL LOW (ref 12–46)
Lymphs Abs: 0.5 10*3/uL — ABNORMAL LOW (ref 0.7–4.0)
MCH: 34.8 pg — AB (ref 26.0–34.0)
MCHC: 35.6 g/dL (ref 30.0–36.0)
MCV: 97.6 fL (ref 78.0–100.0)
MONO ABS: 0.1 10*3/uL (ref 0.1–1.0)
Monocytes Relative: 1 % — ABNORMAL LOW (ref 3–12)
NEUTROS PCT: 92 % — AB (ref 43–77)
Neutro Abs: 6.9 10*3/uL (ref 1.7–7.7)
Platelets: 205 10*3/uL (ref 150–400)
RBC: 4.66 MIL/uL (ref 3.87–5.11)
RDW: 12.6 % (ref 11.5–15.5)
WBC: 7.4 10*3/uL (ref 4.0–10.5)

## 2013-10-20 LAB — LIPASE, BLOOD: LIPASE: 20 U/L (ref 11–59)

## 2013-10-20 LAB — POC URINE PREG, ED: PREG TEST UR: NEGATIVE

## 2013-10-20 MED ORDER — GI COCKTAIL ~~LOC~~
30.0000 mL | Freq: Once | ORAL | Status: AC
  Administered 2013-10-20: 30 mL via ORAL
  Filled 2013-10-20: qty 30

## 2013-10-20 MED ORDER — ACETAMINOPHEN 325 MG PO TABS
650.0000 mg | ORAL_TABLET | Freq: Once | ORAL | Status: AC
  Administered 2013-10-20: 650 mg via ORAL
  Filled 2013-10-20: qty 2

## 2013-10-20 MED ORDER — LORAZEPAM 2 MG/ML IJ SOLN
1.0000 mg | Freq: Once | INTRAMUSCULAR | Status: AC
  Administered 2013-10-20: 1 mg via INTRAVENOUS
  Filled 2013-10-20: qty 1

## 2013-10-20 MED ORDER — ONDANSETRON HCL 4 MG/2ML IJ SOLN
4.0000 mg | Freq: Once | INTRAMUSCULAR | Status: AC
  Administered 2013-10-20: 4 mg via INTRAVENOUS
  Filled 2013-10-20: qty 2

## 2013-10-20 MED ORDER — MECLIZINE HCL 25 MG PO TABS: 25.0000 mg | ORAL_TABLET | Freq: Four times a day (QID) | ORAL | Status: DC

## 2013-10-20 MED ORDER — DICYCLOMINE HCL 20 MG PO TABS: 20.0000 mg | ORAL_TABLET | Freq: Two times a day (BID) | ORAL | Status: DC

## 2013-10-20 NOTE — Discharge Instructions (Signed)
You are advised to stop using marijuana because this is the most likely reason you are having nausea, vomiting and abdominal pain.    SEEK IMMEDIATE MEDICAL ATTENTION IF: The pain does not go away or becomes severe.  A temperature above 101 develops.  Repeated vomiting occurs (multiple episodes).  The pain becomes localized to portions of the abdomen. The right side could possibly be appendicitis. In an adult, the left lower portion of the abdomen could be colitis or diverticulitis.  Blood is being passed in stools or vomit (bright red or black tarry stools).  Return also if you develop chest pain, difficulty breathing, dizziness or fainting, or become confused, poorly responsive, or inconsolable (young children).  Marijuana Abuse and Chemical Dependency WHEN IS DRUG USE A PROBLEM? Problems related to drug use usually begin with abuse of the substance and lead to dependency.  Abuse is repeated use of a drug with recurrent and significant negative consequences. Abuse happens anytime drug use is interfering with normal living activities including:   Failure to fulfill major obligations at work, school or home (poor work International aid/development worker, missing work or school and/or neglecting children and home).  Engaging in activities that are physically dangerous (driving a car or doing recreational activities such as swimming or rock climbing) while under the effects of the drug.  Recurrent drug-related legal problems (arrests for disorderly conduct or assault and battery).  Recurrent social or interpersonal problems caused or increased by the effects of the drug (arguments with family or friends, or physical fights). Dependency has two parts.   You first develop an emotional/psychological dependence. Psychological dependence develops when your mind tells you that the drug is needed. You come to believe it helps you cope with life.  This is usually followed by physical dependence which has developed when  continuing increases of drugs are required to get the same feeling or "high." This may result in:  Withdrawal symptoms such as shakes or tremors.  The substance being over a longer period of time than intended.  An ongoing desire, or unsuccessful effort to, cut down or control the use.  Greater amounts of time spent getting the drug, using the drug or recovering from the effects of the drug.  Important social, work or interests and activities are given up or reduced because or drug use.  Substance is used despite knowledge of ongoing physical (ulcers) or psychological (depression) problems. SIGNS OF CHEMICAL DEPENDENCY:  Friends or family say there is a problem.  Fighting when using drugs.  Having blackouts (not remembering what you do while using).  Feel sick from using drugs but continue using.  Lie about use or amounts of drugs used.  Need drugs to get you going.  Need drugs to relate to people or feel comfortable in social situations.  Use drugs to forget problems. A "yes" answered to any of the above signs of chemical dependency indicates there are problems. The longer the use of drugs continues, the greater the problems will become. If there is a family history of drug or alcohol use it is best not to experiment with drugs. Experimentation leads to tolerance. Addiction is followed by dependency where drugs are now needed not just to get high but to feel normal. Addiction cannot be cured but it can be stopped. This often requires outside help and the care of professionals. Treatment centers are listed in the yellow pages under: Cocaine, Narcotics, and Alcoholics anonymous. Most hospitals and clinics can refer you to a specialized care center.  WHAT IS MARIJUANA? Marijuana is a plant which grows wild all over the world. The plant contains many chemicals but the active ingredient of the plant is THC (tetrahydrocannabinol). This is responsible for the "high" perceived by people  using the drug. HOW IS MARIJUANA USED? Marijuana is smoked, eaten in brownies or any other food, and drank as a tea. WHAT ARE THE EFFECTS OF MARIJUANA? Marijuana is a nervous system depressant which slows the thinking process. Because of this effect, users think marijuana has a calming effect. Actually what happens is the air carrying tubules in the lung become relaxed and allow more oxygen to enter. This causes the user to feel high. The blood pressure falls so less blood reaches the brain and the heart speeds up. As the effects wear off the user becomes depressed. Some people become very paranoid during use. They feel as though people are out to get them. Periodic use can interfere with performance at school or work. Generally Marijuana use does not develop into a physical dependence, but it is very habit forming. Marijuana is also seen as a gateway to use of harder drugs. Strong habits such as using Marijuana, as with all drugs and addictions, can only be helped by stopping use of all chemicals. This is hard but may save your life.  OTHER HEALTH RISKS OF MARIJUANA AND DRUG USE ARE: The increased possibility of getting AIDS or hepatitis (liver inflammation).  HOW TO STAY DRUG FREE ONCE YOU HAVE QUIT USING:  Develop healthy activities and form friends who do not use drugs.  Stay away from the drug scene.  Tell the those who want you to use drugs you have other, better things to do.  Have ready excuses available about why you cannot use.  Attend 12-Step Meetings for support from other recovering people. FOR MORE HELP OR INFORMATION CONTACT YOUR LOCAL CAREGIVER, CLINIC, OR HOSPITAL. Document Released: 07/02/2000 Document Revised: 10/30/2012 Document Reviewed: 08/02/2007 [facility] Patient Information 2014 [place], [place]. Cyclic Vomiting Syndrome Cyclic vomiting syndrome is a benign condition in which patients experience bouts or cycles of severe nausea and vomiting that last for hours or even  days. The bouts of nausea and vomiting alternate with longer periods of no symptoms and generally good health. Cyclic vomiting syndrome occurs mostly in children, but can affect adults. CAUSES  [facility] has no known cause. Each episode is typically similar to the previous ones. The episodes tend to:   Start at about the same time of day.  Last the same length of time.  Present the same symptoms at the same level of intensity. Cyclic vomiting syndrome can begin at any age in children and adults. Cyclic vomiting syndrome usually starts between the ages of 3 and 7 years. In adults, episodes tend to occur less often than they do in children, but they last longer. Furthermore, the events or situations that trigger episodes in adults cannot always be pinpointed as easily as they can in children. There are 4 phases of cyclic vomiting syndrome: 1. Prodrome. The prodrome phase signals that an episode of nausea and vomiting is about to begin. This phase can last from just a few minutes to several hours. This phase is often marked by belly (abdominal) pain. Sometimes taking medicine early in the prodrome phase can stop an episode in progress. However, sometimes there is no warning. A person may simply wake up in the middle of the night or early morning and begin vomiting. 2. Episode. The episode phase consists of:  Severe  vomiting.  Nausea.  Gagging (retching). 3. Recovery. The recovery phase begins when the nausea and vomiting stop. Healthy color, appetite, and energy return. 4. Symptom-free interval. The symptom-free interval phase is the period between episodes when no symptoms are present. TRIGGERS Episodes can be triggered by an infection or event. Examples of triggers include:  Infections.  Colds, allergies, sinus problems, and the flu.  Eating certain foods such as chocolate or cheese.  Foods with monosodium glutamate (MSG) or preservatives.  Fast foods.  Pre-packaged foods.  Foods with  low nutritional value (junk foods).  Overeating.  Eating just before going to bed.  Hot weather.  Dehydration.  Not enough sleep or poor sleep quality.  Physical exhaustion.  Menstruation.  Motion sickness.  Emotional stress (school or home difficulties).  Excitement or stress. SYMPTOMS  The main symptoms of cyclic vomiting syndrome are:  Severe vomiting.  Nausea.  Gagging (retching). Episodes usually begin at night or the first thing in the morning. Episodes may include vomiting or retching up to 5 or 6 times an hour during the worst of the episode. Episodes usually last anywhere from 1 to 4 days. Episodes can last for up to 10 days. Other symptoms include:  Paleness.  Exhaustion.  Listlessness.  Abdominal pain.  Loose stools or diarrhea. Sometimes the nausea and vomiting are so severe that a person appears to be almost unconscious. Sensitivity to light, headache, fever, dizziness, may also accompany an episode. In addition, the vomiting may cause drooling and excessive thirst. Drinking water usually leads to more vomiting, though the water can dilute the acid in the vomit, making the episode a little less painful. Continuous vomiting can lead to dehydration, which means that the body has lost excessive water and salts. DIAGNOSIS  Cyclic vomiting syndrome is hard to diagnose because there are no clear tests to identify it. A caregiver must diagnose cyclic vomiting syndrome by looking at symptoms and medical history. A caregiver must exclude more common diseases or disorders that can also cause nausea and vomiting. Also, diagnosis takes time because caregivers need to identify a pattern or cycle to the vomiting. TREATMENT  Cyclic vomiting syndrome cannot be cured. Treatment varies, but people with cyclic vomiting syndrome should get plenty of rest and sleep and take medications that prevent, stop, or lessen the vomiting episodes and other symptoms. People whose episodes  are frequent and long-lasting may be treated during the symptom-free intervals in an effort to prevent or ease future episodes. The symptom-free phase is a good time to eliminate anything known to trigger an episode. For example, if episodes are brought on by stress or excitement, this period is the time to find ways to reduce stress and stay calm. If sinus problems or allergies cause episodes, those conditions should be treated. The triggers listed above should be avoided or prevented. Because of the similarities between migraine and cyclic vomiting syndrome, caregivers treat some people with severe cyclic vomiting syndrome with drugs that are also used for migraine headaches. The drugs are designed to:  Prevent episodes.  Reduce their frequency.  Lessen their severity. HOME CARE INSTRUCTIONS Once a vomiting episode begins, treatment is supportive. It helps to stay in bed and sleep in a dark, quiet room. Severe nausea and vomiting may require hospitalization and intravenous (IV) fluids to prevent dehydration. Relaxing medications (sedatives) may help if the nausea continues. Sometimes, during the prodrome phase, it is possible to stop an episode from happening altogether. Only take over-the-counter or prescription medicines for pain,  discomfort or fever as directed by your caregiver. Do not give aspirin to children. During the recovery phase, drinking water and replacing lost electrolytes (salts in the blood) are very important. Electrolytes are salts that the body needs to function well and stay healthy. Symptoms during the recovery phase can vary. Some people find that their appetites return to normal immediately, while others need to begin by drinking clear liquids and then move slowly to solid food. RELATED COMPLICATIONS The severe vomiting that defines cyclic vomiting syndrome is a risk factor for several complications:  Dehydration Vomiting causes the body to lose water quickly.  Electrolyte  imbalance Vomiting also causes the body to lose the important salts it needs to keep working properly.  Peptic esophagitis The tube that connects the mouth to the stomach (esophagus) becomes injured from the stomach acid that comes up with the vomit.  Hematemesis The esophagus becomes irritated and bleeds, so blood mixes with the vomit.  Mallory-Weiss tear The lower end of the esophagus may tear open or the stomach may bruise from vomiting or retching.  Tooth decay The acid in the vomit can hurt the teeth by corroding the tooth enamel. SEEK MEDICAL CARE IF: You have questions or problems. Document Released: 09/13/2001 Document Revised: 09/27/2011 Document Reviewed: 10/12/2010 [facility] Patient Information 2014 [place], [place].

## 2013-10-20 NOTE — ED Notes (Signed)
Pt tolerating PO fluids without issue. No emesis.

## 2013-10-20 NOTE — ED Provider Notes (Signed)
CSN: [id number]     Arrival date & time 10/20/13  1635 History   First MD Initiated Contact with Patient 10/20/13 1713     Chief Complaint  Patient presents with  . Emesis     (Consider location/radiation/quality/duration/timing/severity/associated sxs/prior Treatment) HPI  Angela Christian  Is a 18 year old female who presents to the emergency department chief complaint of abdominal pain nausea and vomiting.  The patient has been seen here multiple times for the same thing.  She has had a GI workup as an outpatient.  She states that they could find nothing.  She states that her pain comes on randomly.  It begins with nausea, that she has perfuse vomiting.  She states that drinking water makes a little bit better.  She also states that Ativan is the only thing that helps her nausea.  She is to Phenergan and Zofran at home without relief.  The patient has been advised previously about cannabis induced hyperemesis emesis syndrome but states that she thinks her issues came about after drinking vinegar. The patient denies any diarrhea, fever, previous history of abdominal surgeries.    Past Medical History  Diagnosis Date  . Anxiety    Past Surgical History  Procedure Laterality Date  . Tonsillectomy     No family history on file. History  Substance Use Topics  . Smoking status: Never Smoker   . Smokeless tobacco: Not on file  . Alcohol Use: Yes     Comment: occ   OB History   Grav Para Term Preterm Abortions TAB SAB Ect Mult Living                 Review of Systems  Ten systems reviewed and are negative for acute change, except as noted in the HPI.    Allergies  Other  Home Medications   Current Outpatient Rx  Name  Route  Sig  Dispense  Refill  . medroxyPROGESTERone (DEPO-PROVERA) 150 MG/ML injection   Intramuscular   Inject 150 mg into the muscle every 3 (three) months.         . ondansetron (ZOFRAN-ODT) 8 MG disintegrating tablet   Oral   Take 1 tablet (8 mg  total) by mouth every 8 (eight) hours as needed for nausea or vomiting.   20 tablet   0   . promethazine (PHENERGAN) 25 MG tablet   Oral   Take 25 mg by mouth every 6 (six) hours as needed for nausea or vomiting.          BP 124/79  Pulse 64  Temp(Src) 97.8 F (36.6 C) (Oral)  Resp 18  Ht 5\' 4"  (1.626 m)  Wt 105 lb (47.628 kg)  BMI 18.01 kg/m2  SpO2 100% Physical Exam  Physical Exam  Nursing note and vitals reviewed. Constitutional: She is oriented to person, place, and time. She appears well-developed and well-nourished. No distress.  HENT:  Head: Normocephalic and atraumatic.  Eyes: Conjunctivae normal and EOM are normal. Pupils are equal, round, and reactive to light. No scleral icterus.  Neck: Normal range of motion.  Cardiovascular: Normal rate, regular rhythm and normal heart sounds.  Exam reveals no gallop and no friction rub.   No murmur heard. Pulmonary/Chest: Effort normal and breath sounds normal. No respiratory distress.  Abdominal: Soft. Bowel sounds are normal. She exhibits no distension and no mass. There is no tenderness. There is no guarding.  Neurological: She is alert and oriented to person, place, and time.  Skin: Skin  is warm and dry. She is not diaphoretic.     ED Course  Procedures (including critical care time) Labs Review Labs Reviewed  CBC WITH DIFFERENTIAL - Abnormal; Notable for the following:    Hemoglobin 16.2 (*)    MCH 34.8 (*)    Neutrophils Relative % 92 (*)    Lymphocytes Relative 7 (*)    Lymphs Abs 0.5 (*)    Monocytes Relative 1 (*)    All other components within normal limits  URINALYSIS, ROUTINE W REFLEX MICROSCOPIC - Abnormal; Notable for the following:    APPearance CLOUDY (*)    Specific Gravity, Urine 1.031 (*)    Glucose, UA 100 (*)    Ketones, ur >80 (*)    Protein, ur 30 (*)    Leukocytes, UA SMALL (*)    All other components within normal limits  URINE MICROSCOPIC-ADD ON - Abnormal; Notable for the following:     Squamous Epithelial / LPF FEW (*)    Casts HYALINE CASTS (*)    All other components within normal limits  COMPREHENSIVE METABOLIC PANEL  LIPASE, BLOOD  URINE RAPID DRUG SCREEN (HOSP PERFORMED)  POC URINE PREG, ED   Imaging Review No results found.   EKG Interpretation None      MDM   Final diagnoses:  Cannabis abuse  Nausea and vomiting    Patient tolerating PO fluids.  Labs unremarkable. Advised stop smoking cannabis Patient is nontoxic, nonseptic appearing, in no apparent distress.  Patient's pain and other symptoms adequately managed in emergency department.  Fluid bolus given.  Labs, imaging and vitals reviewed.  Patient does not meet the SIRS or Sepsis criteria.  On repeat exam patient does not have a surgical abdomin and there are nor peritoneal signs.  No indication of appendicitis, bowel obstruction, bowel perforation, cholecystitis, diverticulitis, PID or ectopic pregnancy.  Patient discharged home with symptomatic treatment and given strict instructions for follow-up with their primary care physician.  I have also discussed reasons to return immediately to the ER.  Patient expresses understanding and agrees with plan.       Arthor Captain, PA-C 10/20/13 2052

## 2013-10-20 NOTE — ED Notes (Signed)
Pt and pt's mother comfortable with discharge and follow up instructions. Prescriptions x2.

## 2013-10-20 NOTE — ED Notes (Signed)
thept is on depo but had a period 2 weeks ago

## 2013-10-20 NOTE — ED Notes (Signed)
The pt has been vomiting since this am with abd cramps.  She has a history of the same and she has tried zofran and phenergan today that has not helped.  Offered zofran to her but  She turned it down.  The person with her reports that she usually has to get ativan

## 2013-10-20 NOTE — ED Notes (Addendum)
Pt had episode of emesis s/p GI cocktail. States "I just want something for the pain."

## 2013-10-21 NOTE — ED Provider Notes (Signed)
Medical screening examination/treatment/procedure(s) were performed by non-physician practitioner and as supervising physician I was immediately available for consultation/collaboration.   EKG Interpretation None        Jestine Bicknell B. Shaquel Chavous, MD 10/21/13 1108 

## 2013-10-24 ENCOUNTER — Encounter (HOSPITAL_COMMUNITY): Payer: Self-pay | Admitting: Emergency Medicine

## 2013-10-24 ENCOUNTER — Emergency Department (HOSPITAL_COMMUNITY)
Admission: EM | Admit: 2013-10-24 | Discharge: 2013-10-24 | Disposition: A | Discharge status: Home / Self Care | Payer: BC Managed Care – PPO | Attending: Emergency Medicine | Admitting: Emergency Medicine

## 2013-10-24 ENCOUNTER — Emergency Department (HOSPITAL_COMMUNITY): Payer: BC Managed Care – PPO

## 2013-10-24 DIAGNOSIS — Z3202 Encounter for pregnancy test, result negative: Secondary | ICD-10-CM | POA: Insufficient documentation

## 2013-10-24 DIAGNOSIS — R110 Nausea: Secondary | ICD-10-CM

## 2013-10-24 DIAGNOSIS — Z79899 Other long term (current) drug therapy: Secondary | ICD-10-CM | POA: Insufficient documentation

## 2013-10-24 DIAGNOSIS — R109 Unspecified abdominal pain: Secondary | ICD-10-CM | POA: Insufficient documentation

## 2013-10-24 DIAGNOSIS — R112 Nausea with vomiting, unspecified: Secondary | ICD-10-CM | POA: Insufficient documentation

## 2013-10-24 DIAGNOSIS — Z8659 Personal history of other mental and behavioral disorders: Secondary | ICD-10-CM | POA: Insufficient documentation

## 2013-10-24 LAB — COMPREHENSIVE METABOLIC PANEL
ALK PHOS: 62 U/L (ref 39–117)
ALT: 17 U/L (ref 0–35)
AST: 29 U/L (ref 0–37)
Albumin: 4.6 g/dL (ref 3.5–5.2)
BUN: 19 mg/dL (ref 6–23)
CO2: 22 mEq/L (ref 19–32)
CREATININE: 1 mg/dL (ref 0.50–1.10)
Calcium: 10.4 mg/dL (ref 8.4–10.5)
Chloride: 97 mEq/L (ref 96–112)
GFR calc non Af Amer: 81 mL/min — ABNORMAL LOW (ref >90)
Glucose, Bld: 83 mg/dL (ref 70–99)
POTASSIUM: 4 meq/L (ref 3.7–5.3)
Sodium: 137 mEq/L (ref 137–147)
TOTAL PROTEIN: 8 g/dL (ref 6.0–8.3)
Total Bilirubin: 1.9 mg/dL — ABNORMAL HIGH (ref 0.3–1.2)

## 2013-10-24 LAB — CBC WITH DIFFERENTIAL/PLATELET
Basophils Absolute: 0 10*3/uL (ref 0.0–0.1)
Basophils Relative: 0 % (ref 0–1)
EOS ABS: 0 10*3/uL (ref 0.0–0.7)
Eosinophils Relative: 0 % (ref 0–5)
HCT: 47.9 % — ABNORMAL HIGH (ref 36.0–46.0)
HEMOGLOBIN: 17 g/dL — AB (ref 12.0–15.0)
Lymphocytes Relative: 19 % (ref 12–46)
Lymphs Abs: 1.8 10*3/uL (ref 0.7–4.0)
MCH: 33.7 pg (ref 26.0–34.0)
MCHC: 35.5 g/dL (ref 30.0–36.0)
MCV: 94.9 fL (ref 78.0–100.0)
MONOS PCT: 6 % (ref 3–12)
Monocytes Absolute: 0.6 10*3/uL (ref 0.1–1.0)
NEUTROS PCT: 75 % (ref 43–77)
Neutro Abs: 6.9 10*3/uL (ref 1.7–7.7)
Platelets: 227 10*3/uL (ref 150–400)
RBC: 5.05 MIL/uL (ref 3.87–5.11)
RDW: 12.2 % (ref 11.5–15.5)
WBC: 9.3 10*3/uL (ref 4.0–10.5)

## 2013-10-24 LAB — URINALYSIS, ROUTINE W REFLEX MICROSCOPIC
Glucose, UA: NEGATIVE mg/dL
Ketones, ur: >80 mg/dL — AB
NITRITE: NEGATIVE
PROTEIN: 30 mg/dL — AB
SPECIFIC GRAVITY, URINE: 1.035 — AB (ref 1.005–1.030)
UROBILINOGEN UA: 1 mg/dL (ref 0.0–1.0)
pH: 6 (ref 5.0–8.0)

## 2013-10-24 LAB — LIPASE, BLOOD: LIPASE: 47 U/L (ref 11–59)

## 2013-10-24 LAB — URINE MICROSCOPIC-ADD ON

## 2013-10-24 LAB — POC URINE PREG, ED: PREG TEST UR: NEGATIVE

## 2013-10-24 MED ORDER — MORPHINE SULFATE 4 MG/ML IJ SOLN
4.0000 mg | Freq: Once | INTRAMUSCULAR | Status: AC
  Administered 2013-10-24: 4 mg via INTRAVENOUS
  Filled 2013-10-24: qty 1

## 2013-10-24 MED ORDER — ONDANSETRON HCL 4 MG/2ML IJ SOLN
4.0000 mg | Freq: Once | INTRAMUSCULAR | Status: AC
  Administered 2013-10-24: 4 mg via INTRAVENOUS
  Filled 2013-10-24: qty 2

## 2013-10-24 MED ORDER — SODIUM CHLORIDE 0.9 % IV BOLUS (SEPSIS)
1000.0000 mL | Freq: Once | INTRAVENOUS | Status: AC
  Administered 2013-10-24: 1000 mL via INTRAVENOUS

## 2013-10-24 NOTE — ED Notes (Signed)
Rob PA at bedside 

## 2013-10-24 NOTE — Discharge Instructions (Signed)

## 2013-10-24 NOTE — ED Notes (Signed)
Pt is taking names of her treatment team, and asking for last names.  Pt is upset that she did not get pain meds.  I explained to the pt that I understand her frustration, but that this was the process every time, that unless the criteria was met for pain med the dr or PA would not order them. Her nurse, nor I have the power to do so.  Sh stated " I hate [facility], and I have heard nothing but bad things, and I came here hoping it would be different not to see the same damn people ( Rob the PA), this place is no different".  I explained to the pt that this is an emergency room if this truly is a chronic problem she should consult her PCP, for a referral to a GI who could better help her, as she has been referred before.  I apologized pt then said " I better go get the doctor before she lost it".

## 2013-10-24 NOTE — ED Notes (Signed)
Pt asked for supervisor and was directed toTerri Teacher, early years/preDoster Charge RN who reiterated yet again that she would have to f/u with her PCP and GI doctor as we only r/o emergency conditions and stabilize pts in the emergency room. Pt began laughing and walked out without signing that she received d/c paperwork. No acute distress upon leaving.

## 2013-10-24 NOTE — ED Provider Notes (Signed)
CSN: [id number]     Arrival date & time 10/24/13  1455 History   First MD Initiated Contact with Patient 10/24/13 1722     Chief Complaint  Patient presents with  . Nausea  . Emesis     (Consider location/radiation/quality/duration/timing/severity/associated sxs/prior Treatment) HPI Comments: Patient presents emergency department with chief complaint of nausea and vomiting. She states that he this problem has been intermittent since Saturday. He denies any hematemesis or melena. She reports moderate abdominal pain, which she describes as crampy in nature. She denies any dysuria.  She's been seen several times for the same problem. Denies any fevers or chills. States that she has not been able to keep anything down for the past 3 days.  The history is provided by the patient. No language interpreter was used.    Past Medical History  Diagnosis Date  . Anxiety    Past Surgical History  Procedure Laterality Date  . Tonsillectomy     No family history on file. History  Substance Use Topics  . Smoking status: Never Smoker   . Smokeless tobacco: Not on file  . Alcohol Use: Yes     Comment: occ   OB History   Grav Para Term Preterm Abortions TAB SAB Ect Mult Living                 Review of Systems  Constitutional: Negative for fever and chills.  Respiratory: Negative for shortness of breath.   Cardiovascular: Negative for chest pain.  Gastrointestinal: Negative for nausea, vomiting, diarrhea and constipation.  Genitourinary: Negative for dysuria.      Allergies  Other  Home Medications   Current Outpatient Rx  Name  Route  Sig  Dispense  Refill  . dicyclomine (BENTYL) 20 MG tablet   Oral   Take 1 tablet (20 mg total) by mouth 2 (two) times daily.   20 tablet   0   . meclizine (ANTIVERT) 25 MG tablet   Oral   Take 1 tablet (25 mg total) by mouth 4 (four) times daily.   28 tablet   0   . medroxyPROGESTERone (DEPO-PROVERA) 150 MG/ML injection   Intramuscular   Inject 150 mg into the muscle every 3 (three) months.         . ondansetron (ZOFRAN-ODT) 8 MG disintegrating tablet   Oral   Take 1 tablet (8 mg total) by mouth every 8 (eight) hours as needed for nausea or vomiting.   20 tablet   0   . promethazine (PHENERGAN) 25 MG tablet   Oral   Take 25 mg by mouth every 6 (six) hours as needed for nausea or vomiting.          BP 148/110  Pulse 112  Temp(Src) 98.3 F (36.8 C) (Oral)  Resp 14  SpO2 99% Physical Exam  Nursing note and vitals reviewed. Constitutional: She is oriented to person, place, and time. She appears well-developed and well-nourished.  Well appearing  HENT:  Head: Normocephalic and atraumatic.  Eyes: Conjunctivae and EOM are normal. Pupils are equal, round, and reactive to light.  Neck: Normal range of motion. Neck supple.  Cardiovascular: Normal rate and regular rhythm.  Exam reveals no gallop and no friction rub.   No murmur heard. Pulmonary/Chest: Effort normal and breath sounds normal. No respiratory distress. She has no wheezes. She has no rales. She exhibits no tenderness.  Abdominal: Soft. Bowel sounds are normal. She exhibits no distension and no mass. There is no tenderness.  There is no rebound and no guarding.  No focal abdominal tenderness, no RLQ tenderness or pain at McBurney's point, no RUQ tenderness or Murphy's sign, no left-sided abdominal tenderness, no fluid wave, or signs of peritonitis   Musculoskeletal: Normal range of motion. She exhibits no edema and no tenderness.  Neurological: She is alert and oriented to person, place, and time.  Skin: Skin is warm and dry.  Psychiatric: She has a normal mood and affect. Her behavior is normal. Judgment and thought content normal.    ED Course  Procedures (including critical care time) Labs Review Labs Reviewed  CBC WITH DIFFERENTIAL - Abnormal; Notable for the following:    Hemoglobin 17.0 (*)    HCT 47.9 (*)    All other components within normal  limits  COMPREHENSIVE METABOLIC PANEL - Abnormal; Notable for the following:    Total Bilirubin 1.9 (*)    GFR calc non Af Amer 81 (*)    All other components within normal limits  LIPASE, BLOOD  URINALYSIS, ROUTINE W REFLEX MICROSCOPIC  POC URINE PREG, ED   Imaging Review No results found.   EKG Interpretation None      MDM   Final diagnoses:  Nausea    Patient with complaint of nausea, vomiting, and abdominal pain. Symptoms and intermittent. She believes they're associated with her marijuana use. [facility]. She was discharged with Bentyl and Antivert. She states that she was feeling better, but then the symptoms started again today. She is well appearing, not in any apparent distress. Will give the patient a little pain medicine, and some Zofran, and will fluid challenge.  10:08 PM I have re-evaluated the patient several times over the past 6 hours.  She has had fluid challenges, has eaten half of a [place] sandwich and has not had any vomiting.  She states that she still feels nauseated.  She states that she is very upset because anytime she comes to the hospital, she gets the same treatment and is sent home.  She has followed up with PCP and gastroenterology.  She states that she wants to be admitted and observed overnight.  I explained that in the absence of any acute findings and the fact that she is tolerating oral intake and is not in any apparent distress, that I cannot admit her.  She states that she is afraid she will go home and vomit again.  I have recommend that she follow up with her PCP and GI in the next week.  She is frustrated that nobody can figure out her vomiting problem, however, in the past 6 hours she has not vomited once.  Therefore, I will discharge to home.    Roxy Horsemanobert Sharai Overbay, PA-C 10/24/13 2213

## 2013-10-24 NOTE — ED Provider Notes (Signed)
Medical screening examination/treatment/procedure(s) were performed by non-physician practitioner and as supervising physician I was immediately available for consultation/collaboration.   EKG Interpretation None        Kristen N Ward, DO 10/24/13 2351 

## 2013-10-24 NOTE — ED Notes (Signed)
Pt disgruntled because diagnosis is unknown. Reiterated to pt what PA noted, that pt needs to follow up w/ GI doctor and take pain meds/nausea medication that she has at home. Pt in no acute distress at this time. Wants to speak to PA again. Rob PA notified.

## 2013-10-24 NOTE — ED Notes (Signed)
Per EMS: Pt c/o intermittent nausea/vomiting.  [facility] seen [facility] on Saturday and was given phenergan and "something for cramping".  Pt states she is not feeling any better.

## 2013-10-24 NOTE — ED Notes (Signed)
Pt given water by tech. No vomiting noted. Pt alert and oriented x 4. No acute distress.

## 2013-10-24 NOTE — ED Notes (Signed)
Pt taken to TCU for US by US tech.

## 2013-10-25 ENCOUNTER — Emergency Department (HOSPITAL_COMMUNITY)
Admission: EM | Admit: 2013-10-25 | Discharge: 2013-10-25 | Disposition: A | Discharge status: Home / Self Care | Payer: BC Managed Care – PPO | Attending: Emergency Medicine | Admitting: Emergency Medicine

## 2013-10-25 ENCOUNTER — Emergency Department (HOSPITAL_COMMUNITY): Payer: BC Managed Care – PPO

## 2013-10-25 DIAGNOSIS — F411 Generalized anxiety disorder: Secondary | ICD-10-CM | POA: Insufficient documentation

## 2013-10-25 DIAGNOSIS — F1210 Cannabis abuse, uncomplicated: Secondary | ICD-10-CM

## 2013-10-25 DIAGNOSIS — R0789 Other chest pain: Secondary | ICD-10-CM | POA: Insufficient documentation

## 2013-10-25 DIAGNOSIS — Z79899 Other long term (current) drug therapy: Secondary | ICD-10-CM | POA: Insufficient documentation

## 2013-10-25 DIAGNOSIS — R112 Nausea with vomiting, unspecified: Secondary | ICD-10-CM | POA: Insufficient documentation

## 2013-10-25 DIAGNOSIS — R109 Unspecified abdominal pain: Secondary | ICD-10-CM | POA: Insufficient documentation

## 2013-10-25 DIAGNOSIS — Z3202 Encounter for pregnancy test, result negative: Secondary | ICD-10-CM | POA: Insufficient documentation

## 2013-10-25 LAB — CBC
HEMATOCRIT: 42.5 % (ref 36.0–46.0)
HEMOGLOBIN: 15.1 g/dL — AB (ref 12.0–15.0)
MCH: 33.8 pg (ref 26.0–34.0)
MCHC: 35.5 g/dL (ref 30.0–36.0)
MCV: 95.1 fL (ref 78.0–100.0)
Platelets: 184 10*3/uL (ref 150–400)
RBC: 4.47 MIL/uL (ref 3.87–5.11)
RDW: 12.3 % (ref 11.5–15.5)
WBC: 10 10*3/uL (ref 4.0–10.5)

## 2013-10-25 LAB — URINALYSIS, ROUTINE W REFLEX MICROSCOPIC
Bilirubin Urine: NEGATIVE
Glucose, UA: NEGATIVE mg/dL
KETONES UR: 40 mg/dL — AB
Nitrite: NEGATIVE
Protein, ur: NEGATIVE mg/dL
Specific Gravity, Urine: 1.018 (ref 1.005–1.030)
UROBILINOGEN UA: 1 mg/dL (ref 0.0–1.0)
pH: 6.5 (ref 5.0–8.0)

## 2013-10-25 LAB — COMPREHENSIVE METABOLIC PANEL
ALT: 18 U/L (ref 0–35)
AST: 29 U/L (ref 0–37)
Albumin: 4.1 g/dL (ref 3.5–5.2)
Alkaline Phosphatase: 50 U/L (ref 39–117)
BILIRUBIN TOTAL: 1.4 mg/dL — AB (ref 0.3–1.2)
BUN: 12 mg/dL (ref 6–23)
CO2: 20 mEq/L (ref 19–32)
CREATININE: 0.84 mg/dL (ref 0.50–1.10)
Calcium: 9.6 mg/dL (ref 8.4–10.5)
Chloride: 103 mEq/L (ref 96–112)
GLUCOSE: 87 mg/dL (ref 70–99)
Potassium: 4 mEq/L (ref 3.7–5.3)
Sodium: 140 mEq/L (ref 137–147)
Total Protein: 7.2 g/dL (ref 6.0–8.3)

## 2013-10-25 LAB — RAPID URINE DRUG SCREEN, HOSP PERFORMED
Amphetamines: NOT DETECTED
BARBITURATES: POSITIVE — AB
Benzodiazepines: NOT DETECTED
Cocaine: NOT DETECTED
Opiates: NOT DETECTED
Tetrahydrocannabinol: POSITIVE — AB

## 2013-10-25 LAB — LIPASE, BLOOD: Lipase: 46 U/L (ref 11–59)

## 2013-10-25 LAB — URINE MICROSCOPIC-ADD ON

## 2013-10-25 LAB — PREGNANCY, URINE: Preg Test, Ur: NEGATIVE

## 2013-10-25 MED ORDER — MORPHINE SULFATE 2 MG/ML IJ SOLN
2.0000 mg | Freq: Once | INTRAMUSCULAR | Status: AC
  Administered 2013-10-25: 2 mg via INTRAVENOUS
  Filled 2013-10-25: qty 1

## 2013-10-25 MED ORDER — SODIUM CHLORIDE 0.9 % IV BOLUS (SEPSIS)
1000.0000 mL | Freq: Once | INTRAVENOUS | Status: AC
  Administered 2013-10-25: 1000 mL via INTRAVENOUS

## 2013-10-25 MED ORDER — ONDANSETRON HCL 4 MG/2ML IJ SOLN
4.0000 mg | Freq: Once | INTRAMUSCULAR | Status: AC
  Administered 2013-10-25: 4 mg via INTRAVENOUS
  Filled 2013-10-25: qty 2

## 2013-10-25 MED ORDER — IOHEXOL 300 MG/ML  SOLN
50.0000 mL | Freq: Once | INTRAMUSCULAR | Status: AC | PRN
  Administered 2013-10-25: 50 mL via ORAL

## 2013-10-25 MED ORDER — ONDANSETRON HCL 4 MG PO TABS: 4.0000 mg | ORAL_TABLET | Freq: Four times a day (QID) | ORAL | Status: DC

## 2013-10-25 MED ORDER — IOHEXOL 300 MG/ML  SOLN
100.0000 mL | Freq: Once | INTRAMUSCULAR | Status: AC | PRN
  Administered 2013-10-25: 100 mL via INTRAVENOUS

## 2013-10-25 NOTE — ED Provider Notes (Signed)
CSN: [id number]     Arrival date & time 10/25/13  1530 History   First MD Initiated Contact with Patient 10/25/13 1548     Chief Complaint  Patient presents with  . Abdominal Pain     (Consider location/radiation/quality/duration/timing/severity/associated sxs/prior Treatment) The history is provided by the patient. No language interpreter was used.  Angela Christian is a 18 y/o F with PMHx of anxiety presenting to the ED with abdominal pain, nausea, and vomiting for 6 days. Patient reported that she has been unable to keep any food or fluids down. Patient reported that she has been having abdominal pain localized to the left side of the abdomen described as a sharp pain that worsens with deep inhalation. Stated that she has not been passing gas or cannot remember her last BM. Patient reported that she drank alcohol last night and had a "hit" of her friend's blunt. Patient reported that she has not followed up with her PCP. Denied heroin, cocaine. Denied chest pain, shortness of breath, difficulty breathing. Denied melena, hematochezia, bile and blood in emesis, fainting, sudden loss of vision, blurred vision, pelvic pain, vaginal pain, vaginal discharge, hematuria, fever, chills, neck pain, neck stiffness. PCP Dr. Kevan NyGates   Past Medical History  Diagnosis Date  . Anxiety    Past Surgical History  Procedure Laterality Date  . Tonsillectomy     No family history on file. History  Substance Use Topics  . Smoking status: Never Smoker   . Smokeless tobacco: Not on file  . Alcohol Use: Yes     Comment: occ   OB History   Grav Para Term Preterm Abortions TAB SAB Ect Mult Living                 Review of Systems  Constitutional: Negative for fever and chills.  Respiratory: Negative for chest tightness.   Cardiovascular: Positive for chest pain.  Gastrointestinal: Positive for nausea, vomiting and abdominal pain. Negative for diarrhea, constipation, blood in stool and anal bleeding.    Genitourinary: Negative for vaginal bleeding, vaginal discharge, vaginal pain and pelvic pain.  Musculoskeletal: Negative for back pain and neck pain.  Neurological: Negative for dizziness and weakness.  All other systems reviewed and are negative.     Allergies  Other  Home Medications   Current Outpatient Rx  Name  Route  Sig  Dispense  Refill  . dicyclomine (BENTYL) 20 MG tablet   Oral   Take 1 tablet (20 mg total) by mouth 2 (two) times daily.   20 tablet   0   . meclizine (ANTIVERT) 25 MG tablet   Oral   Take 1 tablet (25 mg total) by mouth 4 (four) times daily.   28 tablet   0   . medroxyPROGESTERone (DEPO-PROVERA) 150 MG/ML injection   Intramuscular   Inject 150 mg into the muscle every 3 (three) months.         . ondansetron (ZOFRAN-ODT) 8 MG disintegrating tablet   Oral   Take 1 tablet (8 mg total) by mouth every 8 (eight) hours as needed for nausea or vomiting.   20 tablet   0   . promethazine (PHENERGAN) 25 MG tablet   Oral   Take 25 mg by mouth every 6 (six) hours as needed for nausea or vomiting.         . ondansetron (ZOFRAN) 4 MG tablet   Oral   Take 1 tablet (4 mg total) by mouth every 6 (six) hours.  12 tablet   0    BP 163/93  Pulse 82  Temp(Src) 98.1 F (36.7 C) (Oral)  Resp 18  SpO2 100% Physical Exam  Nursing note and vitals reviewed. Constitutional: She is oriented to person, place, and time. She appears well-developed and well-nourished. No distress.  HENT:  Head: Normocephalic and atraumatic.  Mouth/Throat: Oropharynx is clear and moist. No oropharyngeal exudate.  Eyes: Conjunctivae and EOM are normal. Pupils are equal, round, and reactive to light. Right eye exhibits no discharge. Left eye exhibits no discharge.  Neck: Normal range of motion. Neck supple. No tracheal deviation present.  Cardiovascular: Normal rate, regular rhythm and normal heart sounds.  Exam reveals no friction rub.   No murmur heard. Pulmonary/Chest:  Effort normal and breath sounds normal. No respiratory distress. She has no wheezes. She has no rales.  Abdominal: Soft. Normal appearance and bowel sounds are normal. There is tenderness in the right upper quadrant, epigastric area and left upper quadrant. There is guarding and positive Murphy's sign. There is no rigidity and no tenderness at McBurney's point.    Discomfort upon palpation to the entire abdomen with most discomfort to the epigastric and LUQ Positive Murphy's Negative McBurney's point  Musculoskeletal: Normal range of motion.  Full ROM to upper and lower extremities without difficulty noted, negative ataxia noted.  Lymphadenopathy:    She has no cervical adenopathy.  Neurological: She is alert and oriented to person, place, and time. No cranial nerve deficit. She exhibits normal muscle tone. Coordination normal.  Skin: Skin is warm and dry. No rash noted. She is not diaphoretic. No erythema.  Psychiatric: She has a normal mood and affect. Her behavior is normal. Thought content normal.    ED Course  Procedures (including critical care time)  8:23 PM Patient re-assessed by this provider. Patient laying in bed reported pain 9/10. Discussed labs and imaging in great detail with patient. Discussed that patient has to stop smoking marijuana because this is marijuana induced nausea and vomiting.   10:27 PM This provider was made aware that patient was experiencing 5/10 chest pain with deep inhalation.   Results for orders placed during the hospital encounter of 10/25/13  CBC      Result Value Ref Range   WBC 10.0  4.0 - 10.5 K/uL   RBC 4.47  3.87 - 5.11 MIL/uL   Hemoglobin 15.1 (*) 12.0 - 15.0 g/dL   HCT 45.4  09.8 - 11.9 %   MCV 95.1  78.0 - 100.0 fL   MCH 33.8  26.0 - 34.0 pg   MCHC 35.5  30.0 - 36.0 g/dL   RDW 14.7  82.9 - 56.2 %   Platelets 184  150 - 400 K/uL  COMPREHENSIVE METABOLIC PANEL      Result Value Ref Range   Sodium 140  137 - 147 mEq/L   Potassium 4.0   3.7 - 5.3 mEq/L   Chloride 103  96 - 112 mEq/L   CO2 20  19 - 32 mEq/L   Glucose, Bld 87  70 - 99 mg/dL   BUN 12  6 - 23 mg/dL   Creatinine, Ser 1.30  0.50 - 1.10 mg/dL   Calcium 9.6  8.4 - 86.5 mg/dL   Total Protein 7.2  6.0 - 8.3 g/dL   Albumin 4.1  3.5 - 5.2 g/dL   AST 29  0 - 37 U/L   ALT 18  0 - 35 U/L   Alkaline Phosphatase 50  39 -  117 U/L   Total Bilirubin 1.4 (*) 0.3 - 1.2 mg/dL   GFR calc non Af Amer >90  >90 mL/min   GFR calc Af Amer >90  >90 mL/min  URINALYSIS, ROUTINE W REFLEX MICROSCOPIC      Result Value Ref Range   Color, Urine YELLOW  YELLOW   APPearance CLEAR  CLEAR   Specific Gravity, Urine 1.018  1.005 - 1.030   pH 6.5  5.0 - 8.0   Glucose, UA NEGATIVE  NEGATIVE mg/dL   Hgb urine dipstick TRACE (*) NEGATIVE   Bilirubin Urine NEGATIVE  NEGATIVE   Ketones, ur 40 (*) NEGATIVE mg/dL   Protein, ur NEGATIVE  NEGATIVE mg/dL   Urobilinogen, UA 1.0  0.0 - 1.0 mg/dL   Nitrite NEGATIVE  NEGATIVE   Leukocytes, UA MODERATE (*) NEGATIVE  PREGNANCY, URINE      Result Value Ref Range   Preg Test, Ur NEGATIVE  NEGATIVE  URINE RAPID DRUG SCREEN (HOSP PERFORMED)      Result Value Ref Range   Opiates NONE DETECTED  NONE DETECTED   Cocaine NONE DETECTED  NONE DETECTED   Benzodiazepines NONE DETECTED  NONE DETECTED   Amphetamines NONE DETECTED  NONE DETECTED   Tetrahydrocannabinol POSITIVE (*) NONE DETECTED   Barbiturates POSITIVE (*) NONE DETECTED  URINE MICROSCOPIC-ADD ON      Result Value Ref Range   Squamous Epithelial / LPF RARE  RARE   WBC, UA 3-6  <3 WBC/hpf   RBC / HPF 0-2  <3 RBC/hpf   Bacteria, UA RARE  RARE  LIPASE, BLOOD      Result Value Ref Range   Lipase 46  11 - 59 U/L     Labs Review Labs Reviewed  CBC - Abnormal; Notable for the following:    Hemoglobin 15.1 (*)    All other components within normal limits  COMPREHENSIVE METABOLIC PANEL - Abnormal; Notable for the following:    Total Bilirubin 1.4 (*)    All other components within normal  limits  URINALYSIS, ROUTINE W REFLEX MICROSCOPIC - Abnormal; Notable for the following:    Hgb urine dipstick TRACE (*)    Ketones, ur 40 (*)    Leukocytes, UA MODERATE (*)    All other components within normal limits  URINE RAPID DRUG SCREEN (HOSP PERFORMED) - Abnormal; Notable for the following:    Tetrahydrocannabinol POSITIVE (*)    Barbiturates POSITIVE (*)    All other components within normal limits  PREGNANCY, URINE  URINE MICROSCOPIC-ADD ON  LIPASE, BLOOD   Imaging Review US Abdomen Complete  10/24/2013   CLINICAL DATA:  Hematuria, abdominal pain.  EXAM: ULTRASOUND ABDOMEN COMPLETE  COMPARISON:  CT scan of June 08, 2013.  FINDINGS: Gallbladder:  No gallstones or wall thickening visualized. No sonographic Murphy sign noted.  Common bile duct:  Diameter: Measures 2.7 mm which is within normal limits.  Liver:  No focal lesion identified. Within normal limits in parenchymal echogenicity.  IVC:  No abnormality visualized.  Pancreas:  Visualized portion unremarkable.  Spleen:  Size and appearance within normal limits.  Right Kidney:  Length: 9.7 cm. Echogenicity within normal limits. No mass or hydronephrosis visualized.  Left Kidney:  Length: 9.1 cm. Echogenicity within normal limits. No mass or hydronephrosis visualized.  Abdominal aorta:  No aneurysm visualized.  Other findings:  None.  IMPRESSION: No abnormality seen in the abdomen.   Electronically Signed   By: Roque Lias M.D.   On: 10/24/2013 19:56   Ct  Abdomen Pelvis W Contrast  10/25/2013   CLINICAL DATA:  Abdominal pain  EXAM: CT ABDOMEN AND PELVIS WITH CONTRAST  TECHNIQUE: Multidetector CT imaging of the abdomen and pelvis was performed using the standard protocol following bolus administration of intravenous contrast.  CONTRAST:  50mL OMNIPAQUE IOHEXOL 300 MG/ML SOLN, 80mL OMNIPAQUE IOHEXOL 300 MG/ML SOLN  COMPARISON:  10/24/2013  FINDINGS: The lung bases are free of acute infiltrate or sizable effusion.  The liver, gallbladder,  spleen, adrenal glands and pancreas are within normal limits. A focal area of decreased attenuation is again noted within the left lobe of the liver likely related to focal fatty infiltration. The kidneys demonstrate a normal enhancement pattern bilaterally.  The bladder is partially distended. Fluid attenuation is noted within the endometrial canal consistent with the patient's menstrual cycle. Minimal free pelvic fluid is noted, this is likely physiologic in nature. . No pelvic mass lesion is noted. The appendix is not well visualized. No right lower quadrant inflammatory changes to suggest appendicitis are noted. No acute bony abnormality is seen.  IMPRESSION: No acute abnormality identified.   Electronically Signed   By: Alcide Clever M.D.   On: 10/25/2013 19:55     EKG Interpretation   Date/Time:  Thursday October 25 2013 22:39:19 EDT Ventricular Rate:  80 PR Interval:  134 QRS Duration: 101 QT Interval:  390 QTC Calculation: 450 R Axis:   62 Text Interpretation:  Sinus rhythm Right atrial enlargement Consider right  ventricular hypertrophy Confirmed by WARD,  DO, KRISTEN [phone]) on  10/25/2013 10:49:25 PM      MDM   Final diagnoses:  Abdominal pain  Cannabis abuse   Medications  sodium chloride 0.9 % bolus 1,000 mL (0 mLs Intravenous Stopped 10/25/13 2020)  morphine 2 MG/ML injection 2 mg (2 mg Intravenous Given 10/25/13 1743)  ondansetron (ZOFRAN) injection 4 mg (4 mg Intravenous Given 10/25/13 1743)  iohexol (OMNIPAQUE) 300 MG/ML solution 50 mL (50 mLs Oral Contrast Given 10/25/13 1740)  iohexol (OMNIPAQUE) 300 MG/ML solution 100 mL (100 mLs Intravenous Contrast Given 10/25/13 1928)  morphine 2 MG/ML injection 2 mg (2 mg Intravenous Given 10/25/13 2044)  ondansetron (ZOFRAN) injection 4 mg (4 mg Intravenous Given 10/25/13 2043)  sodium chloride 0.9 % bolus 1,000 mL (0 mLs Intravenous Stopped 10/25/13 2215)  ondansetron (ZOFRAN) injection 4 mg (4 mg Intravenous Given 10/25/13 2218)   Filed  Vitals:   10/25/13 1532 10/25/13 1832 10/25/13 2221  BP: 143/94 152/76 163/93  Pulse: 103 92 82  Temp: 99.3 F (37.4 C)  98.1 F (36.7 C)  TempSrc: Oral  Oral  Resp: 20 19 18   SpO2: 100% 100% 100%     Patient presenting to the ED with nausea, vomiting, and abdominal pain x 6 days that has not improved. Patient reported that she has been unable to keep any food or fluids down. Patient reported that she drank some alcohol last night and smoked some marijuana.  This provider reviewed the patient's chart. Patient was seen on 10/20/2013 for similar issue and was diagnosed with cannabis abuse. Patient seen yesterday, 10/24/2013 where US abdomen was negative and labs unremarkable.  Alert and oriented. GCS 15. Heart rate and rhythm normal. Lungs CTAB. BS normactive, soft- discomfort upon palpation to the RUQ, LUQ and epigastric - LUQ worse. Positive guarding.  EKG noted normal sinus rhythm with a heart rate of 80 bpm. CBC negative elevated WBC noted - Hgb 15.1, dehydration. CMP negative findings. UA noted trace of Hgb and leukocytes. Lipase  negative elevation. Urine pregnancy negative. UA positive for barbituates and cannabis. CT abdomen pelvis with contrast negative for acute abdominal abnormalities. Doubt acute abdominal processes - negative findings of appendicitis, cholecystitis, pancreatitis. Definitive etiology of abdominal pain unknown - suspicion to be from alcohol and cannabis that patient had last night. This will be the patient's third time in the ED setting regarding this discomfort. Patient able to tolerate fluids PO without episode of emesis. Pain medications administered in the ED setting - patient tolerated well. Patient stable, afebrile. Patient non-toxic appearing. Discharged patient. Discharged patient with zofran. Discussed with patient to continue to take medications as prescribed. Discussed with patient to rest and stay hydrated. Discussed with patient to avoid alcohol and illicit drugs.  Referred to PCP and GI. Discussed with patient to closely monitor symptoms and if symptoms are to worsen or change to report back to the ED - strict return instructions given.  Patient agreed to plan of care, understood, all questions answered.   Raymon Mutton, PA-C 10/26/13 0405

## 2013-10-25 NOTE — ED Notes (Signed)
Pt states she wants pain medication, her stomach is hurting "really" bad.

## 2013-10-25 NOTE — ED Notes (Signed)
Pt reports a chest pain that increases with expiration.  Pt rates pain a 5/10.  Marissa, PA made aware.

## 2013-10-25 NOTE — ED Notes (Signed)
Pt c/o abdominal pain, constipation, and N/V x 4 days. Pt seen in ED last night, prescribed medication. Pt denies any relief. Pt reports being told that smoking marijuana is causing these episodes. Pt denies any use of alcohol or drugs today.

## 2013-10-25 NOTE — ED Notes (Signed)
Pt reports nausea after drinking water but pt denies emesis.

## 2013-10-25 NOTE — ED Notes (Signed)
Bed: [facility] Expected date:  Expected time:  Means of arrival:  Comments: EMS 

## 2013-10-25 NOTE — Discharge Instructions (Signed)
Please call your doctor for a followup appointment within 24-48 hours. When you talk to your doctor please let them know that you were seen in the emergency department and have them acquire all of your records so that they can discuss the findings with you and formulate a treatment plan to fully care for your new and ongoing problems. Please call and set-up an appointment with your primary care provider and GI Please rest and stay hydrated Please avoid foods high in fat, grease, spice Please stick with a light diet of water, tea, rice, broth Please continue to monitor symptoms closely and if symptoms are to worsen or change (fever greater than 101, chills, sweating, nausea, vomiting, diarrhea, blood in the stools, black tarry stools, dizziness, weakness, fainting, chest pain, shortness of breath, difficulty breathing) please report back to the ED immediately   Abdominal Pain, Women Abdominal (stomach, pelvic, or belly) pain can be caused by many things. It is important to tell your doctor:  The location of the pain.  Does it come and go or is it present all the time?  Are there things that start the pain (eating certain foods, exercise)?  Are there other symptoms associated with the pain (fever, nausea, vomiting, diarrhea)? All of this is helpful to know when trying to find the cause of the pain. CAUSES   Stomach: virus or bacteria infection, or ulcer.  Intestine: appendicitis (inflamed appendix), regional ileitis (Crohn's disease), ulcerative colitis (inflamed colon), irritable bowel syndrome, diverticulitis (inflamed diverticulum of the colon), or cancer of the stomach or intestine.  Gallbladder disease or stones in the gallbladder.  Kidney disease, kidney stones, or infection.  Pancreas infection or cancer.  Fibromyalgia (pain disorder).  Diseases of the female organs:  Uterus: fibroid (non-cancerous) tumors or infection.  Fallopian tubes: infection or tubal  pregnancy.  Ovary: cysts or tumors.  Pelvic adhesions (scar tissue).  Endometriosis (uterus lining tissue growing in the pelvis and on the pelvic organs).  Pelvic congestion syndrome (female organs filling up with blood just before the menstrual period).  Pain with the menstrual period.  Pain with ovulation (producing an egg).  Pain with an IUD (intrauterine device, birth control) in the uterus.  Cancer of the female organs.  Functional pain (pain not caused by a disease, may improve without treatment).  Psychological pain.  Depression. DIAGNOSIS  Your doctor will decide the seriousness of your pain by doing an examination.  Blood tests.  X-rays.  Ultrasound.  CT scan (computed tomography, special type of X-ray).  MRI (magnetic resonance imaging).  Cultures, for infection.  Barium enema (dye inserted in the large intestine, to better view it with X-rays).  Colonoscopy (looking in intestine with a lighted tube).  Laparoscopy (minor surgery, looking in abdomen with a lighted tube).  Major abdominal exploratory surgery (looking in abdomen with a large incision). TREATMENT  The treatment will depend on the cause of the pain.   Many cases can be observed and treated at home.  Over-the-counter medicines recommended by your caregiver.  Prescription medicine.  Antibiotics, for infection.  Birth control pills, for painful periods or for ovulation pain.  Hormone treatment, for endometriosis.  Nerve blocking injections.  Physical therapy.  Antidepressants.  Counseling with a psychologist or psychiatrist.  Minor or major surgery. HOME CARE INSTRUCTIONS   Do not take laxatives, unless directed by your caregiver.  Take over-the-counter pain medicine only if ordered by your caregiver. Do not take aspirin because it can cause an upset stomach or bleeding.  Try a clear liquid diet (broth or water) as ordered by your caregiver. Slowly move to a bland diet, as  tolerated, if the pain is related to the stomach or intestine.  Have a thermometer and take your temperature several times a day, and record it.  Bed rest and sleep, if it helps the pain.  Avoid sexual intercourse, if it causes pain.  Avoid stressful situations.  Keep your follow-up appointments and tests, as your caregiver orders.  If the pain does not go away with medicine or surgery, you may try:  Acupuncture.  Relaxation exercises (yoga, meditation).  Group therapy.  Counseling. SEEK MEDICAL CARE IF:   You notice certain foods cause stomach pain.  Your home care treatment is not helping your pain.  You need stronger pain medicine.  You want your IUD removed.  You feel faint or lightheaded.  You develop nausea and vomiting.  You develop a rash.  You are having side effects or an allergy to your medicine. SEEK IMMEDIATE MEDICAL CARE IF:   Your pain does not go away or gets worse.  You have a fever.  Your pain is felt only in portions of the abdomen. The right side could possibly be appendicitis. The left lower portion of the abdomen could be colitis or diverticulitis.  You are passing blood in your stools (bright red or black tarry stools, with or without vomiting).  You have blood in your urine.  You develop chills, with or without a fever.  You pass out. MAKE SURE YOU:   Understand these instructions.  Will watch your condition.  Will get help right away if you are not doing well or get worse. Document Released: 05/02/2007 Document Revised: 09/27/2011 Document Reviewed: 05/22/2009 [facility] Patient Information 2014 [place], [place].   Emergency Department Resource Guide 1) Find a Doctor and Pay Out of Pocket Although you won't have to find out who is covered by your insurance plan, it is a good idea to ask around and get recommendations. You will then need to call the office and see if the doctor you have chosen will accept you as a new patient and  what types of options they offer for patients who are self-pay. Some doctors offer discounts or will set up payment plans for their patients who do not have insurance, but you will need to ask so you aren't surprised when you get to your appointment.  2) Contact Your Local Health Department Not all health departments have doctors that can see patients for sick visits, but many do, so it is worth a call to see if yours does. If you don't know where your local health department is, you can check in your phone book. The CDC also has a tool to help you locate your state's health department, and many state websites also have listings of all of their local health departments.  3) Find a Walk-in Clinic If your illness is not likely to be very severe or complicated, you may want to try a walk in clinic. These are popping up all over the country in pharmacies, drugstores, and shopping centers. They're usually staffed by nurse practitioners or physician assistants that have been trained to treat common illnesses and complaints. They're usually fairly quick and inexpensive. However, if you have serious medical issues or chronic medical problems, these are probably not your best option.  No Primary Care Doctor: - Call Health Connect at  [phone] - they can help you locate a primary care doctor that  accepts your insurance, provides certain services, etc. - Physician Referral Service- [phone]  Chronic Pain Problems: Organization         Address  Phone   Notes  [facility]  [phone] Patients need to be referred by their primary care doctor.   Medication Assistance: Organization         Address  Phone   Notes  [facility] Medication [facility] [address] [place] [place], [place] [address] [phone] --Must be a resident of [facility] -- Must have NO insurance coverage whatsoever (no Medicaid/ Medicare, etc.) -- The pt. MUST have a primary care doctor  that directs their care regularly and follows them in the community   [facility]  [phone]   [employer]  [phone]    Agencies that provide inexpensive medical care: Organization         Address  Phone   Notes  [facility]  [phone]   [facility]    [phone]   [facility] [address] [place], [place] [address] [phone]   [facility] [address] [place], [place] [phone]   Planned Parenthood    [phone]   [facility]    [phone]   [facility]  [address], [place] Phone:  [phone], Fax:  [phone] Hours of Operation:  9 am - 6 pm, M-F.  Also accepts Medicaid/Medicare and self-pay.  [facility] for Children  [address], [place] Phone: [phone], Fax: [phone]. Hours of Operation:  8:30 am - 5:30 pm, M-F.  Also accepts Medicaid and self-pay.  [facility] [place] [address] [place] Phone: [phone]   [facility] [address] Natasha Bence [place], [place] [phone], Ext. [phone] Mondays & Thursdays: 7-9 AM.  First 15 patients are seen on a first come, first serve basis.    Medicaid-accepting [facility] Providers:  Organization         Address  Phone   Notes  [facility] [address], [place] [phone] Also accepts self-pay patients.  [facility] [address] Laurell Josephs [place], [place]  [phone]   [facility] [address], [place] [phone]   [facility] [address] [place] [phone]   Renaye Rakers [address], [place]   [phone] Only accepts [place] Access [place] patients after they have their name applied to their card.   Self-Pay (no insurance) in [facility]:  Organization          Address  Phone   Notes  Sickle Cell Patients, [facility] Internal Medicine [address] [place], [place] [phone]   [facility] [address] [place], [place] [phone]   [facility] [place]  [address], [place] [phone]   [facility]/Dr. Osei-Bonsu  [address], [place] or [address], [place] [phone] Phone number for both [place] and [place] locations is the same.  [facility] [address] [place] [phone]   [place] [address] [place] or [address] [phone]   [facility] [address], [place] [phone], phone; [phone], fax Sees patients 1st and 3rd Saturday of every month.  Must not qualify for public or private insurance (i.e. Medicaid, Medicare, [place] Health Choice, Veterans' Benefits)  Household income should be no more than 200% of the poverty level The clinic cannot treat you if you are pregnant or think you are pregnant  Sexually transmitted diseases are not treated at the clinic.    Dental Care: Organization         Address  Phone  Notes  [facility] [address] [place], [place] [phone] Accepts children up to age 15 who are enrolled in [place] or [place] Health Choice; pregnant women with a Medicaid card; and children who have applied for Medicaid or [place] Health Choice, but were declined, whose parents can pay a reduced fee at time of service.  [facility] Department of [facility]  [address], [place] [phone] Accepts children up to age 71 who are enrolled in [place] or [place] Health Choice; pregnant women with a Medicaid card; and children who have applied for Medicaid or [place] Health Choice, but were declined, whose parents can pay a reduced fee at time of  service.  [facility]  [address] [place], [place] [phone] Patients are seen by appointment only. Walk-ins are not accepted. [facility] will see patients 28 years of age and older. Monday - Tuesday (8am-5pm) Most Wednesdays (8:30-5pm) $30 per visit, cash only  [facility] Adult Dental Access PRO[facility]  [address], [facility] [phone] Patients are seen by appointment only. Walk-ins are not accepted. [facility] will see patients 32 years of age and older. One Wednesday Evening (Monthly: Volunteer Based).  $30 per visit, cash only  [employer]  [phone] for adults; Children under age 4, call Graduate Pediatric Dentistry at [phone]. Children aged 31-14, please call [phone] to request a pediatric application.  Dental services are provided in all areas of dental care including fillings, crowns and bridges, complete and partial dentures, implants, gum treatment, root canals, and extractions. Preventive care is also provided. Treatment is provided to both adults and children. Patients are selected via a lottery and there is often a waiting list.   [facility] [address] [place]  [phone] [email or web address]   [facility] [address] [place], [place] [phone], Ext. [phone] Second and Fourth Thursday of each month, opens at 6:30 AM; Clinic ends at 9 AM.  Patients are seen on a first-come first-served basis, and a limited number are seen during each clinic.   [facility]  [address] Ether Griffins [place], [place] [phone]   Eligibility Requirements You must have lived in [place], [place], or [place] counties for at least the last three months.   You cannot be eligible for state or federal sponsored [place], including [employer], [place], or [employer].   You generally cannot be eligible for healthcare insurance through your employer.     How to apply: Eligibility screenings are held every Tuesday and Wednesday afternoon from 1:00 pm until 4:00 pm. You do not need an appointment for the interview!  [facility] [address] [place], [place] [phone]   [facility] Health Department  [phone]   [facility] Health Department  [phone]   [facility] Department  [phone]    Behavioral Health Resources in the Community: Intensive Outpatient Programs Organization         Address  Phone  Notes  [facility] [address], [place], [place] [phone]   [facility] Outpatient [address] [place], [place] [phone]   [facility] [address] [place], [place]  [phone]   [facility] Mental Health [address]  [place], [place] [phone] or [phone]   Substance Abuse Resources Organization         Address  Phone  Notes  [facility]  [phone]   [facility]  [phone]   [facility]  [phone]   Floydene Flock  [phone]   Residential & Outpatient Substance Abuse Program  [phone]   Psychological Services Organization         Address  Phone  Notes  [facility] Behavioral Health  [phone]   [facility] Services  [phone]   [facility] Mental Health [address] [place] [phone] or [phone]    Mobile Crisis Teams Organization         Address  Phone  Notes  [facility], Mobile Crisis Care Unit  [phone]   [facility]  [address] [place], [place] [phone]   Doristine Locks [address] [place] [phone]    Self-Help/Support Groups Organization         Address  Phone             Notes  [facility] [place] - variety of support groups  336- I7437963 Call for more information  Narcotics Anonymous (NA), [facility] [address]  Dr, [employer] [place]  2 meetings at this location   Statistician         Address  Phone  Notes  [facility] [address],    [place]  [phone]   [facility]  [address], [place] [address], [place], [place] [phone]   [facility] [address] [place], [place] [phone] Admissions: 8am-3pm M-F  [facility] [address]    [place], [place] [phone]   [facility] [address] [place], [place], [place] [phone]   [facility] [address]  [place], [place] [phone]   [facility] - Intensive Outpatient [address] Laurell Joseph[address], [place], [place] [phone]   [facility] (Add[facility] [address] [place]  [place], [place] [phone] or [phone]   [facility] ([facility]) [address], [place], [place] [phone] Accepts Medicaid  [facility] [address],  [place] [phone] Substance Abuse/Addiction Treatment   [facility] Organization         Address  Phone  Notes  [facility]  [phone]   Angie Fava, PhD [address] Ervin Knack [place], [place]   [phone] or [phone]   [facility] Behavioral   [address] [place] [address] [place], [place] [phone]   [facility] [address] [place], [place] [phone] Insurance/Medicaid/sponsorship through [employer] and Families [address]                                    [place], [place] [phone] Therapy/tele-psych/case  [facility] [address]  [address]   [place], [place] [phone]    Dr. Lolly Mustache  [phone]   [facility]  [employer] [facility] Dept. 1) [address], [place] 2) [address] [place] 3)  [address], [place] [phone]  [phone]   [facility] Child Abuse  Hotline [phone] or [phone] (After Hours)

## 2013-10-26 NOTE — ED Provider Notes (Signed)
Medical screening examination/treatment/procedure(s) were performed by non-physician practitioner and as supervising physician I was immediately available for consultation/collaboration.   EKG Interpretation   Date/Time:  Thursday October 25 2013 22:39:19 EDT Ventricular Rate:  80 PR Interval:  134 QRS Duration: 101 QT Interval:  390 QTC Calculation: 450 R Axis:   62 Text Interpretation:  Sinus rhythm Right atrial enlargement Consider right  ventricular hypertrophy Confirmed by Jadene PieriniWARD,  DO, KRISTEN [phone]([id number]) on  10/25/2013 10:49:25 PM       Raeford RazorStephen Octavian Godek, MD 10/26/13 [phone]

## 2013-10-27 ENCOUNTER — Encounter (HOSPITAL_COMMUNITY): Payer: Self-pay | Admitting: Emergency Medicine

## 2013-10-27 ENCOUNTER — Emergency Department (HOSPITAL_COMMUNITY)
Admission: EM | Admit: 2013-10-27 | Discharge: 2013-10-27 | Disposition: A | Discharge status: Home / Self Care | Payer: BC Managed Care – PPO | Attending: Emergency Medicine | Admitting: Emergency Medicine

## 2013-10-27 DIAGNOSIS — E860 Dehydration: Secondary | ICD-10-CM | POA: Insufficient documentation

## 2013-10-27 DIAGNOSIS — R112 Nausea with vomiting, unspecified: Secondary | ICD-10-CM | POA: Insufficient documentation

## 2013-10-27 DIAGNOSIS — Z8659 Personal history of other mental and behavioral disorders: Secondary | ICD-10-CM | POA: Insufficient documentation

## 2013-10-27 LAB — URINALYSIS, ROUTINE W REFLEX MICROSCOPIC
Bilirubin Urine: NEGATIVE
GLUCOSE, UA: NEGATIVE mg/dL
Ketones, ur: >80 mg/dL — AB
Leukocytes, UA: NEGATIVE
Nitrite: NEGATIVE
Protein, ur: NEGATIVE mg/dL
SPECIFIC GRAVITY, URINE: 1.022 (ref 1.005–1.030)
UROBILINOGEN UA: 1 mg/dL (ref 0.0–1.0)
pH: 6.5 (ref 5.0–8.0)

## 2013-10-27 LAB — BASIC METABOLIC PANEL
BUN: 8 mg/dL (ref 6–23)
CO2: 21 mEq/L (ref 19–32)
Calcium: 9.3 mg/dL (ref 8.4–10.5)
Chloride: 99 mEq/L (ref 96–112)
Creatinine, Ser: 0.81 mg/dL (ref 0.50–1.10)
GFR calc Af Amer: >90 mL/min (ref >90)
GFR calc non Af Amer: >90 mL/min (ref >90)
Glucose, Bld: 76 mg/dL (ref 70–99)
Potassium: 4.5 mEq/L (ref 3.7–5.3)
Sodium: 138 mEq/L (ref 137–147)

## 2013-10-27 LAB — URINE MICROSCOPIC-ADD ON

## 2013-10-27 MED ORDER — FENTANYL CITRATE 0.05 MG/ML IJ SOLN
25.0000 ug | Freq: Once | INTRAMUSCULAR | Status: AC
  Administered 2013-10-27: 25 ug via INTRAVENOUS
  Filled 2013-10-27: qty 2

## 2013-10-27 MED ORDER — SODIUM CHLORIDE 0.9 % IV BOLUS (SEPSIS)
1000.0000 mL | Freq: Once | INTRAVENOUS | Status: AC
  Administered 2013-10-27: 1000 mL via INTRAVENOUS

## 2013-10-27 MED ORDER — FENTANYL CITRATE 0.05 MG/ML IJ SOLN
50.0000 ug | Freq: Once | INTRAMUSCULAR | Status: AC
  Administered 2013-10-27: 50 ug via INTRAVENOUS
  Filled 2013-10-27: qty 2

## 2013-10-27 MED ORDER — PROCHLORPERAZINE EDISYLATE 5 MG/ML IJ SOLN
10.0000 mg | Freq: Once | INTRAMUSCULAR | Status: AC
  Administered 2013-10-27: 10 mg via INTRAVENOUS
  Filled 2013-10-27: qty 2

## 2013-10-27 MED ORDER — ONDANSETRON HCL 4 MG/2ML IJ SOLN
4.0000 mg | Freq: Once | INTRAMUSCULAR | Status: AC
  Administered 2013-10-27: 4 mg via INTRAVENOUS
  Filled 2013-10-27: qty 2

## 2013-10-27 MED ORDER — PROMETHAZINE HCL 12.5 MG PO TABS
12.5000 mg | ORAL_TABLET | Freq: Once | ORAL | Status: AC
  Administered 2013-10-27: 12.5 mg via ORAL
  Filled 2013-10-27: qty 1

## 2013-10-27 NOTE — ED Provider Notes (Signed)
CSN: [id number]     Arrival date & time 10/27/13  0114 History   First MD Initiated Contact with Patient 10/27/13 0153     Chief Complaint  Patient presents with  . 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 She is back to the ED for the 3rd time this week with complaint of nausea. She says she has really not had much to eat or drink because of her sx. She tried to eat a bowl of oatmeal this morning but, says it came back up. She denies much abdominal pain. Last BM was 2 days ago. Patient says she has not smoked marijuana in 2 days yet, her sx persist.   She denies GU sx. She has had a recent CT abd/pelvis and abdominal U/S - both studies were non-diagnostic and unremarkable. She says she has seen a GI specialist twice. She says she wishes to be admitted for further evaluation. She has been taking phenergan and zofran without adequate relief. She had been prescribed a PPI but, left the medication at her mother's house.   She has been seeing her PCP for the same sx.   Past Medical History  Diagnosis Date  . Anxiety    Past Surgical History  Procedure Laterality Date  . Tonsillectomy     No family history on file. History  Substance Use Topics  . Smoking status: Never Smoker   . Smokeless tobacco: Not on file  . Alcohol Use: Yes     Comment: occ   OB History   Grav Para Term Preterm Abortions TAB SAB Ect Mult Living                 Review of Systems   Ten point review of symptoms performed and is negative with the exception of symptoms noted above.   Allergies  Other  Home Medications   Current Outpatient Rx  Name  Route  Sig  Dispense  Refill  . dicyclomine (BENTYL) 20 MG tablet   Oral   Take 1 tablet (20 mg total) by mouth 2 (two) times daily.   20 tablet   0   . meclizine (ANTIVERT) 25  MG tablet   Oral   Take 1 tablet (25 mg total) by mouth 4 (four) times daily.   28 tablet   0   . medroxyPROGESTERone (DEPO-PROVERA) 150 MG/ML injection   Intramuscular   Inject 150 mg into the muscle every 3 (three) months.         . ondansetron (ZOFRAN) 4 MG tablet   Oral   Take 1 tablet (4 mg total) by mouth every 6 (six) hours.   12 tablet   0   . promethazine (PHENERGAN) 25 MG tablet   Oral   Take 25 mg by mouth every 6 (six) hours as needed for nausea or vomiting.          BP 156/103  Pulse 97  Temp(Src) 98.3 F (36.8 C) (Oral)  Resp 18  SpO2 100%  LMP 10/27/2013 Physical Exam Gen: well developed and well nourished appearing Head: NCAT Eyes: PERL, EOMI Nose: no epistaixis or rhinorrhea Mouth/throat: mucosa is mildly dehydrated appearing and pink Neck: supple, no stridor Lungs: CTA B, no wheezing, rhonchi or rales CV: regular rate and rythm, good distal pulses.  Abd: soft, notender, nondistended Back: no ttp, no cva ttp Skin: warm  and dry Ext: no edema, normal to inspection Neuro: CN ii-xii grossly intact, no focal deficits Psyche; normal affect,  calm and cooperative.  ED Course  Procedures (including critical care time) Labs Review Labs Reviewed - No data to display Imaging Review Ct Abdomen Pelvis W Contrast  10/25/2013   CLINICAL DATA:  Abdominal pain  EXAM: CT ABDOMEN AND PELVIS WITH CONTRAST  TECHNIQUE: Multidetector CT imaging of the abdomen and pelvis was performed using the standard protocol following bolus administration of intravenous contrast.  CONTRAST:  50mL OMNIPAQUE IOHEXOL 300 MG/ML SOLN, 80mL OMNIPAQUE IOHEXOL 300 MG/ML SOLN  COMPARISON:  10/24/2013  FINDINGS: The lung bases are free of acute infiltrate or sizable effusion.  The liver, gallbladder, spleen, adrenal glands and pancreas are within normal limits. A focal area of decreased attenuation is again noted within the left lobe of the liver likely related to focal fatty infiltration. The  kidneys demonstrate a normal enhancement pattern bilaterally.  The bladder is partially distended. Fluid attenuation is noted within the endometrial canal consistent with the patient's menstrual cycle. Minimal free pelvic fluid is noted, this is likely physiologic in nature. . No pelvic mass lesion is noted. The appendix is not well visualized. No right lower quadrant inflammatory changes to suggest appendicitis are noted. No acute bony abnormality is seen.  IMPRESSION: No acute abnormality identified.   Electronically Signed   By: Alcide Clever M.D.   On: 10/25/2013 19:55      MDM   Nausea, vomiting and dehydration. We are treating with IVF and antiemetic. Patient has had an extensive, thorough and recent work up for her sx. I have explained to her using several different tactics that there is really no clear indication for her admission to the hospital. We seem to keep coming back to her feeling that she should not be discharged until she is free of nausea.   BMP wnl. Patient stable for d/c. Has PCP and GI to f/u with. Continue phergan and zofran, as needed, for N/V.   Brandt Loosen, MD 10/27/13 [phone]

## 2013-10-27 NOTE — ED Notes (Signed)
Waiting until 0645 then patient will leave room d/t IV fentanyl given at [facility]0615

## 2013-10-27 NOTE — ED Notes (Signed)
Pt's mother is picking her up

## 2013-10-27 NOTE — Discharge Instructions (Signed)
Cyclic Vomiting Syndrome  Cyclic vomiting syndrome is a benign condition in which patients experience bouts or cycles of severe nausea and vomiting that last for hours or even days. The bouts of nausea and vomiting alternate with longer periods of no symptoms and generally good health. Cyclic vomiting syndrome occurs mostly in children, but can affect adults.  CAUSES   [facility] has no known cause. Each episode is typically similar to the previous ones. The episodes tend to:   · Start at about the same time of day.  · Last the same length of time.  · Present the same symptoms at the same level of intensity.  Cyclic vomiting syndrome can begin at any age in children and adults. Cyclic vomiting syndrome usually starts between the ages of 3 and 7 years. In adults, episodes tend to occur less often than they do in children, but they last longer. Furthermore, the events or situations that trigger episodes in adults cannot always be pinpointed as easily as they can in children.  There are 4 phases of cyclic vomiting syndrome:  1. Prodrome. The prodrome phase signals that an episode of nausea and vomiting is about to begin. This phase can last from just a few minutes to several hours. This phase is often marked by belly (abdominal) pain. Sometimes taking medicine early in the prodrome phase can stop an episode in progress. However, sometimes there is no warning. A person may simply wake up in the middle of the night or early morning and begin vomiting.  2. Episode. The episode phase consists of:  · Severe vomiting.  · Nausea.  · Gagging (retching).  3. Recovery. The recovery phase begins when the nausea and vomiting stop. Healthy color, appetite, and energy return.  4. Symptom-free interval. The symptom-free interval phase is the period between episodes when no symptoms are present.  TRIGGERS  Episodes can be triggered by an infection or event. Examples of triggers include:  · Infections.  · Colds, allergies, sinus problems, and  the flu.  · Eating certain foods such as chocolate or cheese.  · Foods with monosodium glutamate (MSG) or preservatives.  · Fast foods.  · Pre-packaged foods.  · Foods with low nutritional value (junk foods).  · Overeating.  · Eating just before going to bed.  · Hot weather.  · Dehydration.  · Not enough sleep or poor sleep quality.  · Physical exhaustion.  · Menstruation.  · Motion sickness.  · Emotional stress (school or home difficulties).  · Excitement or stress.  SYMPTOMS   The main symptoms of cyclic vomiting syndrome are:  · Severe vomiting.  · Nausea.  · Gagging (retching).  Episodes usually begin at night or the first thing in the morning. Episodes may include vomiting or retching up to 5 or 6 times an hour during the worst of the episode. Episodes usually last anywhere from 1 to 4 days. Episodes can last for up to 10 days. Other symptoms include:  · Paleness.  · Exhaustion.  · Listlessness.  · Abdominal pain.  · Loose stools or diarrhea.  Sometimes the nausea and vomiting are so severe that a person appears to be almost unconscious. Sensitivity to light, headache, fever, dizziness, may also accompany an episode. In addition, the vomiting may cause drooling and excessive thirst. Drinking water usually leads to more vomiting, though the water can dilute the acid in the vomit, making the episode a little less painful. Continuous vomiting can lead to dehydration, which means that the   body has lost excessive water and salts.  DIAGNOSIS   Cyclic vomiting syndrome is hard to diagnose because there are no clear tests to identify it. A caregiver must diagnose cyclic vomiting syndrome by looking at symptoms and medical history. A caregiver must exclude more common diseases or disorders that can also cause nausea and vomiting. Also, diagnosis takes time because caregivers need to identify a pattern or cycle to the vomiting.  TREATMENT   Cyclic vomiting syndrome cannot be cured. Treatment varies, but people with  cyclic vomiting syndrome should get plenty of rest and sleep and take medications that prevent, stop, or lessen the vomiting episodes and other symptoms.  People whose episodes are frequent and long-lasting may be treated during the symptom-free intervals in an effort to prevent or ease future episodes. The symptom-free phase is a good time to eliminate anything known to trigger an episode. For example, if episodes are brought on by stress or excitement, this period is the time to find ways to reduce stress and stay calm. If sinus problems or allergies cause episodes, those conditions should be treated. The triggers listed above should be avoided or prevented.  Because of the similarities between migraine and cyclic vomiting syndrome, caregivers treat some people with severe cyclic vomiting syndrome with drugs that are also used for migraine headaches. The drugs are designed to:  · Prevent episodes.  · Reduce their frequency.  · Lessen their severity.  HOME CARE INSTRUCTIONS  Once a vomiting episode begins, treatment is supportive. It helps to stay in bed and sleep in a dark, quiet room. Severe nausea and vomiting may require hospitalization and intravenous (IV) fluids to prevent dehydration. Relaxing medications (sedatives) may help if the nausea continues. Sometimes, during the prodrome phase, it is possible to stop an episode from happening altogether. Only take over-the-counter or prescription medicines for pain, discomfort or fever as directed by your caregiver. Do not give aspirin to children.  During the recovery phase, drinking water and replacing lost electrolytes (salts in the blood) are very important. Electrolytes are salts that the body needs to function well and stay healthy. Symptoms during the recovery phase can vary. Some people find that their appetites return to normal immediately, while others need to begin by drinking clear liquids and then move slowly to solid food.  RELATED COMPLICATIONS  The  severe vomiting that defines cyclic vomiting syndrome is a risk factor for several complications:  · Dehydration Vomiting causes the body to lose water quickly.  · Electrolyte imbalance Vomiting also causes the body to lose the important salts it needs to keep working properly.  · Peptic esophagitis The tube that connects the mouth to the stomach (esophagus) becomes injured from the stomach acid that comes up with the vomit.  · Hematemesis The esophagus becomes irritated and bleeds, so blood mixes with the vomit.  · Mallory-Weiss tear The lower end of the esophagus may tear open or the stomach may bruise from vomiting or retching.  · Tooth decay The acid in the vomit can hurt the teeth by corroding the tooth enamel.  SEEK MEDICAL CARE IF:  You have questions or problems.  Document Released: 09/13/2001 Document Revised: 09/27/2011 Document Reviewed: 10/12/2010  [employer]® Patient Information ©2014 [employer].

## 2013-10-27 NOTE — ED Notes (Signed)
The pt is c/o nv since last Saturday and no bm since Saturday.  Mild abd cramps but her period started today.  lmp now.  Unable to keep food down

## 2013-10-28 ENCOUNTER — Encounter (HOSPITAL_COMMUNITY): Payer: Self-pay | Admitting: Emergency Medicine

## 2013-10-28 DIAGNOSIS — R112 Nausea with vomiting, unspecified: Secondary | ICD-10-CM | POA: Insufficient documentation

## 2013-10-28 DIAGNOSIS — Z3202 Encounter for pregnancy test, result negative: Secondary | ICD-10-CM | POA: Insufficient documentation

## 2013-10-28 DIAGNOSIS — E860 Dehydration: Secondary | ICD-10-CM | POA: Insufficient documentation

## 2013-10-28 DIAGNOSIS — N390 Urinary tract infection, site not specified: Secondary | ICD-10-CM | POA: Insufficient documentation

## 2013-10-28 DIAGNOSIS — Z8659 Personal history of other mental and behavioral disorders: Secondary | ICD-10-CM | POA: Insufficient documentation

## 2013-10-28 DIAGNOSIS — Z79899 Other long term (current) drug therapy: Secondary | ICD-10-CM | POA: Insufficient documentation

## 2013-10-28 DIAGNOSIS — G8929 Other chronic pain: Secondary | ICD-10-CM | POA: Insufficient documentation

## 2013-10-28 LAB — COMPREHENSIVE METABOLIC PANEL
ALT: 19 U/L (ref 0–35)
AST: 24 U/L (ref 0–37)
Albumin: 5 g/dL (ref 3.5–5.2)
Alkaline Phosphatase: 59 U/L (ref 39–117)
BUN: 7 mg/dL (ref 6–23)
CALCIUM: 10.4 mg/dL (ref 8.4–10.5)
CO2: 21 mEq/L (ref 19–32)
CREATININE: 0.8 mg/dL (ref 0.50–1.10)
Chloride: 98 mEq/L (ref 96–112)
GFR calc non Af Amer: >90 mL/min (ref >90)
GLUCOSE: 85 mg/dL (ref 70–99)
Potassium: 3.8 mEq/L (ref 3.7–5.3)
Sodium: 138 mEq/L (ref 137–147)
TOTAL PROTEIN: 8.4 g/dL — AB (ref 6.0–8.3)
Total Bilirubin: 1.2 mg/dL (ref 0.3–1.2)

## 2013-10-28 LAB — CBC WITH DIFFERENTIAL/PLATELET
Basophils Absolute: 0 10*3/uL (ref 0.0–0.1)
Basophils Relative: 1 % (ref 0–1)
Eosinophils Absolute: 0 10*3/uL (ref 0.0–0.7)
Eosinophils Relative: 0 % (ref 0–5)
HEMATOCRIT: 48.1 % — AB (ref 36.0–46.0)
HEMOGLOBIN: 17.7 g/dL — AB (ref 12.0–15.0)
LYMPHS ABS: 1.6 10*3/uL (ref 0.7–4.0)
LYMPHS PCT: 22 % (ref 12–46)
MCH: 34.4 pg — ABNORMAL HIGH (ref 26.0–34.0)
MCHC: 36.8 g/dL — ABNORMAL HIGH (ref 30.0–36.0)
MCV: 93.6 fL (ref 78.0–100.0)
MONO ABS: 0.4 10*3/uL (ref 0.1–1.0)
MONOS PCT: 5 % (ref 3–12)
NEUTROS ABS: 5.6 10*3/uL (ref 1.7–7.7)
Neutrophils Relative %: 73 % (ref 43–77)
Platelets: 245 10*3/uL (ref 150–400)
RBC: 5.14 MIL/uL — AB (ref 3.87–5.11)
RDW: 11.9 % (ref 11.5–15.5)
WBC: 7.6 10*3/uL (ref 4.0–10.5)

## 2013-10-28 NOTE — ED Notes (Signed)
Pt experiencing emesis, and incontinence. Emesis x 1 today. Incontinence is new, and she states it woke her up.

## 2013-10-28 NOTE — ED Notes (Signed)
Has been seen multiple times for emesis this month

## 2013-10-29 ENCOUNTER — Emergency Department (HOSPITAL_COMMUNITY)
Admission: EM | Admit: 2013-10-29 | Discharge: 2013-10-29 | Disposition: A | Discharge status: Home / Self Care | Payer: BC Managed Care – PPO | Attending: Emergency Medicine | Admitting: Emergency Medicine

## 2013-10-29 DIAGNOSIS — N390 Urinary tract infection, site not specified: Secondary | ICD-10-CM

## 2013-10-29 DIAGNOSIS — E860 Dehydration: Secondary | ICD-10-CM

## 2013-10-29 DIAGNOSIS — R112 Nausea with vomiting, unspecified: Secondary | ICD-10-CM

## 2013-10-29 LAB — URINE MICROSCOPIC-ADD ON

## 2013-10-29 LAB — POC URINE PREG, ED: Preg Test, Ur: NEGATIVE

## 2013-10-29 LAB — URINALYSIS, ROUTINE W REFLEX MICROSCOPIC
GLUCOSE, UA: NEGATIVE mg/dL
Ketones, ur: 40 mg/dL — AB
Nitrite: NEGATIVE
Protein, ur: 30 mg/dL — AB
SPECIFIC GRAVITY, URINE: 1.028 (ref 1.005–1.030)
Urobilinogen, UA: 1 mg/dL (ref 0.0–1.0)
pH: 6.5 (ref 5.0–8.0)

## 2013-10-29 MED ORDER — CEPHALEXIN 250 MG PO CAPS
500.0000 mg | ORAL_CAPSULE | Freq: Once | ORAL | Status: AC
  Administered 2013-10-29: 500 mg via ORAL
  Filled 2013-10-29: qty 2

## 2013-10-29 MED ORDER — TRAMADOL HCL 50 MG PO TABS: 50.0000 mg | ORAL_TABLET | Freq: Four times a day (QID) | ORAL | Status: DC | PRN

## 2013-10-29 MED ORDER — PROMETHAZINE HCL 25 MG/ML IJ SOLN
12.5000 mg | Freq: Once | INTRAMUSCULAR | Status: AC
  Administered 2013-10-29: 12.5 mg via INTRAVENOUS
  Filled 2013-10-29: qty 1

## 2013-10-29 MED ORDER — SODIUM CHLORIDE 0.9 % IV BOLUS (SEPSIS)
1000.0000 mL | Freq: Once | INTRAVENOUS | Status: AC
  Administered 2013-10-29: 1000 mL via INTRAVENOUS

## 2013-10-29 MED ORDER — FAMOTIDINE IN NACL 20-0.9 MG/50ML-% IV SOLN
20.0000 mg | Freq: Once | INTRAVENOUS | Status: AC
  Administered 2013-10-29: 20 mg via INTRAVENOUS
  Filled 2013-10-29: qty 50

## 2013-10-29 MED ORDER — CEPHALEXIN 500 MG PO CAPS: 500.0000 mg | ORAL_CAPSULE | Freq: Three times a day (TID) | ORAL | Status: DC

## 2013-10-29 NOTE — ED Provider Notes (Addendum)
CSN: [id number]     Arrival date & time 10/28/13  2224 History   First MD Initiated Contact with Patient 10/29/13 0105     Chief Complaint  Patient presents with  . Emesis     (Consider location/radiation/quality/duration/timing/severity/associated sxs/prior Treatment) HPI Patient is a 18 year old woman who has been seen several times recently in the emergency department. Actually saw her myself earlier in the week.  She presents today with complaints of urinary incontinence x2. She has also had nausea and vomiting. She vomited nonbloody emesis x3. The patient noted some mild burning dysuria. No unusual vaginal discharge. No fever area no back pain.  The patient has a history of chronic nausea and vomiting associated with abdominal pain. She's been worked up by GI for this and is also had CT abdomen pelvis and gallbladder ultrasound which were nondiagnostic. Working hypothesis is that patient's nausea and vomiting is secondary to marijuana use.  Past Medical History  Diagnosis Date  . Anxiety    Past Surgical History  Procedure Laterality Date  . Tonsillectomy     History reviewed. No pertinent family history. History  Substance Use Topics  . Smoking status: Never Smoker   . Smokeless tobacco: Never Used  . Alcohol Use: Yes     Comment: occ   OB History   Grav Para Term Preterm Abortions TAB SAB Ect Mult Living                 Review of Systems Ten point review of symptoms performed and is negative with the exception of symptoms noted above.     Allergies  Other  Home Medications   Current Outpatient Rx  Name  Route  Sig  Dispense  Refill  . dicyclomine (BENTYL) 20 MG tablet   Oral   Take 1 tablet (20 mg total) by mouth 2 (two) times daily.   20 tablet   0   . meclizine (ANTIVERT) 25 MG tablet   Oral   Take 1 tablet (25 mg total) by mouth 4 (four) times daily.   28 tablet   0   . medroxyPROGESTERone (DEPO-PROVERA) 150 MG/ML injection   Intramuscular  Inject 150 mg into the muscle every 3 (three) months.         . ondansetron (ZOFRAN) 4 MG tablet   Oral   Take 1 tablet (4 mg total) by mouth every 6 (six) hours.   12 tablet   0   . promethazine (PHENERGAN) 25 MG tablet   Oral   Take 25 mg by mouth every 6 (six) hours as needed for nausea or vomiting.          BP 125/86  Pulse 110  Temp(Src) 98.2 F (36.8 C) (Oral)  Resp 16  Ht 5\' 4"  (1.626 m)  Wt 107 lb 1 oz (48.563 kg)  BMI 18.37 kg/m2  SpO2 98%  LMP 10/27/2013 Physical Exam Gen: well developed and well nourished appearing Head: NCAT Eyes: PERL, EOMI Nose: no epistaixis or rhinorrhea Mouth/throat: mucosa is moist and pink Neck: supple, no stridor Lungs: CTA B, no wheezing, rhonchi or rales CV: regular rate and rythm, good distal pulses.  Abd: soft, notender, nondistended Back: no ttp, no cva ttp Skin: warm and dry Ext: no edema, normal to inspection Neuro: CN ii-xii grossly intact, no focal deficits Psyche; normal affect,  calm and cooperative.  ED Course  Procedures (including critical care time) Labs Review  Results for orders placed during the hospital encounter of 10/29/13 (from the  past 24 hour(s))  COMPREHENSIVE METABOLIC PANEL     Status: Abnormal   Collection Time    10/28/13 11:17 PM      Result Value Ref Range   Sodium 138  137 - 147 mEq/L   Potassium 3.8  3.7 - 5.3 mEq/L   Chloride 98  96 - 112 mEq/L   CO2 21  19 - 32 mEq/L   Glucose, Bld 85  70 - 99 mg/dL   BUN 7  6 - 23 mg/dL   Creatinine, Ser 6.640.80  0.50 - 1.10 mg/dL   Calcium 40.310.4  8.4 - 47.410.5 mg/dL   Total Protein 8.4 (*) 6.0 - 8.3 g/dL   Albumin 5.0  3.5 - 5.2 g/dL   AST 24  0 - 37 U/L   ALT 19  0 - 35 U/L   Alkaline Phosphatase 59  39 - 117 U/L   Total Bilirubin 1.2  0.3 - 1.2 mg/dL   GFR calc non Af Amer >90  >90 mL/min   GFR calc Af Amer >90  >90 mL/min  CBC WITH DIFFERENTIAL     Status: Abnormal   Collection Time    10/28/13 11:17 PM      Result Value Ref Range   WBC 7.6   4.0 - 10.5 K/uL   RBC 5.14 (*) 3.87 - 5.11 MIL/uL   Hemoglobin 17.7 (*) 12.0 - 15.0 g/dL   HCT 25.948.1 (*) 56.336.0 - 87.546.0 %   MCV 93.6  78.0 - 100.0 fL   MCH 34.4 (*) 26.0 - 34.0 pg   MCHC 36.8 (*) 30.0 - 36.0 g/dL   RDW 64.311.9  32.911.5 - 51.815.5 %   Platelets 245  150 - 400 K/uL   Neutrophils Relative % 73  43 - 77 %   Neutro Abs 5.6  1.7 - 7.7 K/uL   Lymphocytes Relative 22  12 - 46 %   Lymphs Abs 1.6  0.7 - 4.0 K/uL   Monocytes Relative 5  3 - 12 %   Monocytes Absolute 0.4  0.1 - 1.0 K/uL   Eosinophils Relative 0  0 - 5 %   Eosinophils Absolute 0.0  0.0 - 0.7 K/uL   Basophils Relative 1  0 - 1 %   Basophils Absolute 0.0  0.0 - 0.1 K/uL    MDM   Patient with UTI. Now PVR on bladder scan which allows us to rule out neurogenic bladder incontinence. Patient noted to be dehydrated based on hemoconcentration and ketones of 40 in urine. We have treated with IVF. Patient is stable for discharge. We will give first round of Keflex prior to discharge and script for same. Patient to f/u with PCP in 2 weeks.   I have discussed lab findings with the patient and advised that she has a UTI and will need antibiotic tx. The patient has not vomited here in the ED after over 7 hours of observation. However, she expresses anger that she is not being admitted to the hospital. She says she can't just stay home every day and vomit.   Of note, she has been persistently polycythemic on her visits to the ED and should be evaluated for PV.  IN addition, she should have thyroid studies. However, this is not an indication for admission. The patient has a PCP and these studies can be performed as an outpatient.   I discussed case with Dr. Toniann FailKakrakandy as the patient remains adamant that she should be admitted. He suggests that we po  challenge with solids. If she fails, he will admit.   Brandt LoosenJulie Manly, MD 10/29/13 [phone]  Brandt LoosenJulie Manly, MD 10/29/13 [phone]

## 2013-10-31 ENCOUNTER — Emergency Department (HOSPITAL_COMMUNITY)
Admission: EM | Admit: 2013-10-31 | Discharge: 2013-11-01 | Disposition: A | Discharge status: Home / Self Care | Payer: BC Managed Care – PPO | Attending: Emergency Medicine | Admitting: Emergency Medicine

## 2013-10-31 ENCOUNTER — Encounter (HOSPITAL_COMMUNITY): Payer: Self-pay | Admitting: Emergency Medicine

## 2013-10-31 DIAGNOSIS — N898 Other specified noninflammatory disorders of vagina: Secondary | ICD-10-CM | POA: Insufficient documentation

## 2013-10-31 DIAGNOSIS — A64 Unspecified sexually transmitted disease: Secondary | ICD-10-CM | POA: Insufficient documentation

## 2013-10-31 DIAGNOSIS — Z79899 Other long term (current) drug therapy: Secondary | ICD-10-CM | POA: Insufficient documentation

## 2013-10-31 DIAGNOSIS — Z3202 Encounter for pregnancy test, result negative: Secondary | ICD-10-CM | POA: Insufficient documentation

## 2013-10-31 DIAGNOSIS — F411 Generalized anxiety disorder: Secondary | ICD-10-CM | POA: Insufficient documentation

## 2013-10-31 DIAGNOSIS — R000 Tachycardia, unspecified: Secondary | ICD-10-CM | POA: Insufficient documentation

## 2013-10-31 DIAGNOSIS — R1084 Generalized abdominal pain: Secondary | ICD-10-CM | POA: Insufficient documentation

## 2013-10-31 DIAGNOSIS — R1110 Vomiting, unspecified: Secondary | ICD-10-CM | POA: Insufficient documentation

## 2013-10-31 LAB — COMPREHENSIVE METABOLIC PANEL
ALBUMIN: 4.8 g/dL (ref 3.5–5.2)
ALT: 8 U/L (ref 0–35)
AST: 24 U/L (ref 0–37)
Alkaline Phosphatase: 52 U/L (ref 39–117)
BILIRUBIN TOTAL: 1.1 mg/dL (ref 0.3–1.2)
BUN: 13 mg/dL (ref 6–23)
CO2: 23 mEq/L (ref 19–32)
CREATININE: 1 mg/dL (ref 0.50–1.10)
Calcium: 10.2 mg/dL (ref 8.4–10.5)
Chloride: 96 mEq/L (ref 96–112)
GFR calc Af Amer: >90 mL/min (ref >90)
GFR calc non Af Amer: 81 mL/min — ABNORMAL LOW (ref >90)
Glucose, Bld: 92 mg/dL (ref 70–99)
POTASSIUM: 3.4 meq/L — AB (ref 3.7–5.3)
Sodium: 139 mEq/L (ref 137–147)
TOTAL PROTEIN: 7.8 g/dL (ref 6.0–8.3)

## 2013-10-31 LAB — CBC WITH DIFFERENTIAL/PLATELET
BASOS PCT: 1 % (ref 0–1)
Basophils Absolute: 0.1 10*3/uL (ref 0.0–0.1)
EOS ABS: 0 10*3/uL (ref 0.0–0.7)
Eosinophils Relative: 0 % (ref 0–5)
HCT: 46.7 % — ABNORMAL HIGH (ref 36.0–46.0)
Hemoglobin: 17.2 g/dL — ABNORMAL HIGH (ref 12.0–15.0)
Lymphocytes Relative: 17 % (ref 12–46)
Lymphs Abs: 1.7 10*3/uL (ref 0.7–4.0)
MCH: 34.9 pg — AB (ref 26.0–34.0)
MCHC: 36.8 g/dL — AB (ref 30.0–36.0)
MCV: 94.7 fL (ref 78.0–100.0)
MONO ABS: 0.3 10*3/uL (ref 0.1–1.0)
Monocytes Relative: 3 % (ref 3–12)
NEUTROS PCT: 80 % — AB (ref 43–77)
Neutro Abs: 8.3 10*3/uL — ABNORMAL HIGH (ref 1.7–7.7)
Platelets: 279 10*3/uL (ref 150–400)
RBC: 4.93 MIL/uL (ref 3.87–5.11)
RDW: 12.1 % (ref 11.5–15.5)
WBC: 10.3 10*3/uL (ref 4.0–10.5)

## 2013-10-31 LAB — WET PREP, GENITAL
TRICH WET PREP: NONE SEEN
YEAST WET PREP: NONE SEEN

## 2013-10-31 LAB — PREGNANCY, URINE: PREG TEST UR: NEGATIVE

## 2013-10-31 MED ORDER — SODIUM CHLORIDE 0.9 % IV BOLUS (SEPSIS)
1000.0000 mL | Freq: Once | INTRAVENOUS | Status: AC
  Administered 2013-10-31: 1000 mL via INTRAVENOUS

## 2013-10-31 MED ORDER — AZITHROMYCIN 250 MG PO TABS
1000.0000 mg | ORAL_TABLET | Freq: Once | ORAL | Status: AC
  Administered 2013-10-31: 1000 mg via ORAL
  Filled 2013-10-31: qty 4

## 2013-10-31 MED ORDER — SODIUM CHLORIDE 0.9 % IV BOLUS (SEPSIS)
500.0000 mL | Freq: Once | INTRAVENOUS | Status: AC
  Administered 2013-10-31: 500 mL via INTRAVENOUS

## 2013-10-31 MED ORDER — LIDOCAINE HCL (PF) 1 % IJ SOLN
INTRAMUSCULAR | Status: AC
  Administered 2013-10-31: 1.7 mL
  Filled 2013-10-31: qty 5

## 2013-10-31 MED ORDER — DIPHENHYDRAMINE HCL 50 MG/ML IJ SOLN
25.0000 mg | Freq: Once | INTRAMUSCULAR | Status: AC
  Administered 2013-10-31: 25 mg via INTRAVENOUS
  Filled 2013-10-31: qty 1

## 2013-10-31 MED ORDER — KETOROLAC TROMETHAMINE 30 MG/ML IJ SOLN
30.0000 mg | Freq: Once | INTRAMUSCULAR | Status: AC
  Administered 2013-10-31: 30 mg via INTRAVENOUS
  Filled 2013-10-31: qty 1

## 2013-10-31 MED ORDER — METOCLOPRAMIDE HCL 5 MG/ML IJ SOLN
10.0000 mg | Freq: Once | INTRAMUSCULAR | Status: AC
  Administered 2013-10-31: 10 mg via INTRAVENOUS
  Filled 2013-10-31: qty 2

## 2013-10-31 MED ORDER — CEFTRIAXONE SODIUM 250 MG IJ SOLR
250.0000 mg | Freq: Once | INTRAMUSCULAR | Status: AC
  Administered 2013-10-31: 250 mg via INTRAMUSCULAR
  Filled 2013-10-31: qty 250

## 2013-10-31 NOTE — ED Notes (Addendum)
Pt st's she has Zofran and Phenergan at home but they are not helping.  Also st's she just started taking the antibiotic for UTI today.

## 2013-10-31 NOTE — ED Notes (Signed)
Pt c/o abd pain with nausea and vomiting.  St's onset of symptoms was 5 days ago.  Pt has been seen here and [facility] several times this month for same.

## 2013-10-31 NOTE — ED Notes (Signed)
Pt. reports persistent nausea/vomitting for several days , seen here 2 days ago diagnosed with Dehydration / UTI / Nausea and Vomitting .

## 2013-10-31 NOTE — ED Notes (Signed)
Pt states she cannot urinate at this time.

## 2013-10-31 NOTE — ED Provider Notes (Signed)
CSN: [id number]     Arrival date & time 10/31/13  1935 History   First MD Initiated Contact with Patient 10/31/13 2027     Chief Complaint  Patient presents with  . Emesis     (Consider location/radiation/quality/duration/timing/severity/associated sxs/prior Treatment) HPI Comments: Angela Christian is a 18 y.o. female with a past medical history of PID presenting the Emergency Department with a chief complaint of vomiting for 10 days. She reports 3-4 episodes of non-bloody emesis. She reports generalized abdominal pain and low abdominal pain.  She describes the discomfort as cramping. She reports relief of symptoms with a warm bath or shower. States Phenergan use at home but without full releif  She reports last marijuana use 1 week ago.  Reports initiating her UTI treatment today. Patient's last menstrual period was 10/27/2013.   The history is provided by the patient and medical records. No language interpreter was used.    Past Medical History  Diagnosis Date  . Anxiety    Past Surgical History  Procedure Laterality Date  . Tonsillectomy     No family history on file. History  Substance Use Topics  . Smoking status: Never Smoker   . Smokeless tobacco: Never Used  . Alcohol Use: Yes     Comment: occ   OB History   Grav Para Term Preterm Abortions TAB SAB Ect Mult Living                 Review of Systems  Constitutional: Negative for fever and chills.  Gastrointestinal: Positive for nausea, vomiting and abdominal pain. Negative for constipation.  Genitourinary: Negative for dysuria, hematuria, flank pain and vaginal discharge.  Musculoskeletal: Negative for back pain.  All other systems reviewed and are negative.     Allergies  Other  Home Medications   Prior to Admission medications   Medication Sig Start Date End Date Taking? Authorizing Provider  cephALEXin (KEFLEX) 500 MG capsule Take 500 mg by mouth 3 (three) times daily. Started medication today 10-31-13  10/29/13  Yes Brandt Loosen, MD  LORazepam (ATIVAN) 0.5 MG tablet Take 0.5 mg by mouth every 4 (four) hours as needed for anxiety.    Yes Historical Provider, MD  meclizine (ANTIVERT) 25 MG tablet Take 25 mg by mouth daily as needed. 10/20/13  Yes Arthor Captain, PA-C  medroxyPROGESTERone (DEPO-PROVERA) 150 MG/ML injection Inject 150 mg into the muscle every 3 (three) months. Last dose was two weeks ago from today (10-31-13)   Yes Historical Provider, MD  promethazine (PHENERGAN) 25 MG tablet Take 25 mg by mouth every 6 (six) hours as needed for nausea or vomiting.   Yes Historical Provider, MD  traMADol (ULTRAM) 50 MG tablet Take 50 mg by mouth every 6 (six) hours as needed. 10/29/13  Yes Brandt Loosen, MD   BP 159/59  Pulse 112  Temp(Src) 98.6 F (37 C) (Oral)  Resp 18  SpO2 100%  LMP 10/27/2013 Physical Exam  Nursing note and vitals reviewed. Constitutional: She is oriented to person, place, and time.  HENT:  Head: Normocephalic and atraumatic.  Eyes: Conjunctivae and EOM are normal. Pupils are equal, round, and reactive to light.  Neck: Neck supple.  Cardiovascular: Regular rhythm.  Tachycardia present.   Pulmonary/Chest: Effort normal and breath sounds normal. No respiratory distress.  Abdominal: Soft. Normal appearance and bowel sounds are normal. She exhibits no distension and no mass. There is generalized tenderness. There is no rigidity, no rebound, no guarding, no CVA tenderness, no tenderness at  McBurney's point and negative Murphy's sign.  Genitourinary: Uterus is not tender. Cervix exhibits no motion tenderness. Right adnexum displays no mass and no tenderness. Left adnexum displays no mass and no tenderness. Vaginal discharge found.  Moderate amount of blood and clots in posterior vaginal vault. Mild discharge per cervical os. Chaperone present.    Musculoskeletal: Normal range of motion.  Neurological: She is alert and oriented to person, place, and time.  Skin: Skin is warm and  dry.  Psychiatric: She has a normal mood and affect. Her behavior is normal.    ED Course  Procedures (including critical care time) Labs Review Results for orders placed during the hospital encounter of 10/31/13  WET PREP, GENITAL      Result Value Ref Range   Yeast Wet Prep HPF POC NONE SEEN  NONE SEEN   Trich, Wet Prep NONE SEEN  NONE SEEN   Clue Cells Wet Prep HPF POC FEW (*) NONE SEEN   WBC, Wet Prep HPF POC MANY (*) NONE SEEN  CBC WITH DIFFERENTIAL      Result Value Ref Range   WBC 10.3  4.0 - 10.5 K/uL   RBC 4.93  3.87 - 5.11 MIL/uL   Hemoglobin 17.2 (*) 12.0 - 15.0 g/dL   HCT 86.5 (*) 78.4 - 69.6 %   MCV 94.7  78.0 - 100.0 fL   MCH 34.9 (*) 26.0 - 34.0 pg   MCHC 36.8 (*) 30.0 - 36.0 g/dL   RDW 29.5  28.4 - 13.2 %   Platelets 279  150 - 400 K/uL   Neutrophils Relative % 80 (*) 43 - 77 %   Neutro Abs 8.3 (*) 1.7 - 7.7 K/uL   Lymphocytes Relative 17  12 - 46 %   Lymphs Abs 1.7  0.7 - 4.0 K/uL   Monocytes Relative 3  3 - 12 %   Monocytes Absolute 0.3  0.1 - 1.0 K/uL   Eosinophils Relative 0  0 - 5 %   Eosinophils Absolute 0.0  0.0 - 0.7 K/uL   Basophils Relative 1  0 - 1 %   Basophils Absolute 0.1  0.0 - 0.1 K/uL  COMPREHENSIVE METABOLIC PANEL      Result Value Ref Range   Sodium 139  137 - 147 mEq/L   Potassium 3.4 (*) 3.7 - 5.3 mEq/L   Chloride 96  96 - 112 mEq/L   CO2 23  19 - 32 mEq/L   Glucose, Bld 92  70 - 99 mg/dL   BUN 13  6 - 23 mg/dL   Creatinine, Ser 4.40  0.50 - 1.10 mg/dL   Calcium 10.2  8.4 - 72.5 mg/dL   Total Protein 7.8  6.0 - 8.3 g/dL   Albumin 4.8  3.5 - 5.2 g/dL   AST 24  0 - 37 U/L   ALT 8  0 - 35 U/L   Alkaline Phosphatase 52  39 - 117 U/L   Total Bilirubin 1.1  0.3 - 1.2 mg/dL   GFR calc non Af Amer 81 (*) >90 mL/min   GFR calc Af Amer >90  >90 mL/min  PREGNANCY, URINE      Result Value Ref Range   Preg Test, Ur NEGATIVE  NEGATIVE   US Abdomen Complete  10/24/2013   CLINICAL DATA:  Hematuria, abdominal pain.  EXAM: ULTRASOUND ABDOMEN  COMPLETE  COMPARISON:  CT scan of June 08, 2013.  FINDINGS: Gallbladder:  No gallstones or wall thickening visualized. No sonographic Murphy sign  noted.  Common bile duct:  Diameter: Measures 2.7 mm which is within normal limits.  Liver:  No focal lesion identified. Within normal limits in parenchymal echogenicity.  IVC:  No abnormality visualized.  Pancreas:  Visualized portion unremarkable.  Spleen:  Size and appearance within normal limits.  Right Kidney:  Length: 9.7 cm. Echogenicity within normal limits. No mass or hydronephrosis visualized.  Left Kidney:  Length: 9.1 cm. Echogenicity within normal limits. No mass or hydronephrosis visualized.  Abdominal aorta:  No aneurysm visualized.  Other findings:  None.  IMPRESSION: No abnormality seen in the abdomen.   Electronically Signed   By: Roque LiasJames  Green M.D.   On: 10/24/2013 19:56   Ct Abdomen Pelvis W Contrast  10/25/2013   CLINICAL DATA:  Abdominal pain  EXAM: CT ABDOMEN AND PELVIS WITH CONTRAST  TECHNIQUE: Multidetector CT imaging of the abdomen and pelvis was performed using the standard protocol following bolus administration of intravenous contrast.  CONTRAST:  50mL OMNIPAQUE IOHEXOL 300 MG/ML SOLN, 80mL OMNIPAQUE IOHEXOL 300 MG/ML SOLN  COMPARISON:  10/24/2013  FINDINGS: The lung bases are free of acute infiltrate or sizable effusion.  The liver, gallbladder, spleen, adrenal glands and pancreas are within normal limits. A focal area of decreased attenuation is again noted within the left lobe of the liver likely related to focal fatty infiltration. The kidneys demonstrate a normal enhancement pattern bilaterally.  The bladder is partially distended. Fluid attenuation is noted within the endometrial canal consistent with the patient's menstrual cycle. Minimal free pelvic fluid is noted, this is likely physiologic in nature. . No pelvic mass lesion is noted. The appendix is not well visualized. No right lower quadrant inflammatory changes to suggest  appendicitis are noted. No acute bony abnormality is seen.  IMPRESSION: No acute abnormality identified.   Electronically Signed   By: Alcide CleverMark  Lukens M.D.   On: 10/25/2013 19:55    Imaging Review No results found.   EKG Interpretation None      MDM   Final diagnoses:  Vomiting  STI (sexually transmitted infection)   Pt with vomiting, multiple work-up's within the past week.  CT without acute findings, abdominial US without acute findings.  Questionable Cannabinoid hyperemesis syndrome.  Dx with UTI 11/28/2013, started antibiotic today.  Will order pelvic for possible infection given history of PID.  Tachycardic on exam, bolus ordered, Reglan ordered for nausea.  CBC shows elevated Hbg, consistent with previous levels. CMP without concerning abnormalities. Negative urine pregnancy.  Re-eval: pt resting comfortably in room sleeping.  Pt's HR has responded to fluids. UA shows normal specific gravity.  Pelvic exam shows white blood cells and clue cells will treat for possible STI. Re-eval pt sleeping in room, denies further emesis.  Tolerated PO intake. Discussed lab results, imaging results, and treatment plan with the patient. Return precautions given. Reports understanding and no other concerns at this time.  Patient is stable for discharge at this time.   Meds given in ED:  Medications  ketorolac (TORADOL) 30 MG/ML injection 30 mg (not administered)  sodium chloride 0.9 % bolus 1,000 mL (0 mLs Intravenous Stopped 10/31/13 2317)  metoCLOPramide (REGLAN) injection 10 mg (10 mg Intravenous Given 10/31/13 2120)  diphenhydrAMINE (BENADRYL) injection 25 mg (25 mg Intravenous Given 10/31/13 2118)  sodium chloride 0.9 % bolus 500 mL (500 mLs Intravenous New Bag/Given 10/31/13 2331)  azithromycin (ZITHROMAX) tablet 1,000 mg (1,000 mg Oral Given 10/31/13 2332)  cefTRIAXone (ROCEPHIN) injection 250 mg (250 mg Intramuscular Given 10/31/13 2331)  lidocaine (  PF) (XYLOCAINE) 1 % injection (1.7 mLs  Given  10/31/13 2332)    Discharge Medication List as of 11/01/2013 12:34 AM    START taking these medications   Details  diphenhydrAMINE (BENADRYL) 25 MG tablet Take 1 tablet (25 mg total) by mouth every 6 (six) hours., Starting 11/01/2013, Until Discontinued, Print    metoCLOPramide (REGLAN) 10 MG tablet Take 1 tablet (10 mg total) by mouth 3 (three) times daily as needed for nausea (headache / nausea). Take with benadryl, Starting 11/01/2013, Until Discontinued, Print    promethazine (PHENERGAN) 25 MG suppository Place 1 suppository (25 mg total) rectally every 6 (six) hours as needed for nausea or vomiting., Starting 11/01/2013, Until Discontinued, Print            Clabe SealLauren M Ashvin Adelson, PA-C 11/02/13 0155

## 2013-10-31 NOTE — ED Notes (Signed)
Patients Mother called 3 times in 45 minutes.  Explained that I could not give her any information due to the patint being 17.  The Mother stated that she talked to 2 other nurses and they talked to her.  Attempted to explain to her that we are caring for her as the doctors are ordering.  Explained to Mother that they need to follow up are directed.  Stated that they have an appointment for in the morning.

## 2013-11-01 LAB — HIV ANTIBODY (ROUTINE TESTING W REFLEX): HIV: NONREACTIVE

## 2013-11-01 LAB — URINALYSIS, ROUTINE W REFLEX MICROSCOPIC
Bilirubin Urine: NEGATIVE
Glucose, UA: NEGATIVE mg/dL
KETONES UR: 40 mg/dL — AB
Leukocytes, UA: NEGATIVE
NITRITE: NEGATIVE
Protein, ur: NEGATIVE mg/dL
Specific Gravity, Urine: 1.016 (ref 1.005–1.030)
UROBILINOGEN UA: 0.2 mg/dL (ref 0.0–1.0)
pH: 7 (ref 5.0–8.0)

## 2013-11-01 LAB — URINE MICROSCOPIC-ADD ON

## 2013-11-01 LAB — GC/CHLAMYDIA PROBE AMP
CT Probe RNA: NEGATIVE
GC PROBE AMP APTIMA: NEGATIVE

## 2013-11-01 MED ORDER — DIPHENHYDRAMINE HCL 25 MG PO TABS: 25.0000 mg | ORAL_TABLET | Freq: Four times a day (QID) | ORAL | Status: DC

## 2013-11-01 MED ORDER — PROMETHAZINE HCL 25 MG/ML IJ SOLN
12.5000 mg | Freq: Once | INTRAMUSCULAR | Status: AC
  Administered 2013-11-01: 12.5 mg via INTRAVENOUS
  Filled 2013-11-01: qty 1

## 2013-11-01 MED ORDER — PROMETHAZINE HCL 25 MG RE SUPP: 25.0000 mg | Freq: Four times a day (QID) | RECTAL | Status: DC | PRN

## 2013-11-01 MED ORDER — METOCLOPRAMIDE HCL 10 MG PO TABS: 10.0000 mg | ORAL_TABLET | Freq: Three times a day (TID) | ORAL | Status: DC | PRN

## 2013-11-01 NOTE — ED Notes (Signed)
Discharge papers reviewed with patient and instructed her to keep her appointment with the MD and to follow up with the other doctors highlighted on the discharge instructions.

## 2013-11-01 NOTE — ED Notes (Signed)
Patient stated she was going to get sick.  Spit small amount of ?emesis in a green bag.

## 2013-11-01 NOTE — Discharge Instructions (Signed)
Call for a follow up appointment with a Family or Primary Care Provider.  Return if Symptoms worsen.   Take medication as prescribed.  Stop smoking Marijuana. Follow up with Gastroenterologist. Follow up with Women's clinic.   You have been treated in the emergency department for an infection, possibly sexually transmitted. Results of your gonorrhea and chlamydia tests are pending and you will be notified if they are positive. It is very important to practice safe sex and use condoms when sexually active, with each sexual encounter. If your results are positive you need to notify all sexual partners so they can be treated as well. The website https://garcia.net/http://www.dontspreadit.com/ can be used to send anonymous text messages or emails to alert sexual contacts. Follow up with your doctor, or OBGYN in regards to today's visit.    Gonorrhea and Chlamydia SYMPTOMS  In females, symptoms may go unnoticed. Symptoms that are more noticeable can include:  Belly (abdominal) pain.  Painful intercourse.  Watery mucous-like discharge from the vagina.  Miscarriage.  Discomfort when urinating.  Inflammation of the rectum.  Abnormal gray-green frothy vaginal discharge  Vaginal itching and irritatio  Itching and irritation of the area outside the vagina.   Painful urination.  Bleeding after sexual intercourse.  In males, symptoms include:  Burning with urination.  Pain in the testicles.  Watery mucous-like discharge from the penis.  It can cause longstanding (chronic) pelvic pain after frequent infections.  TREATMENT  PID can cause women to not be able to have children (sterile) if left untreated or if half-treated.  It is important to finish ALL medications given to you.  This is a sexually transmitted infection. So you are also at risk for other sexually transmitted diseases, including HIV (AIDS), it is recommended that you get tested. HOME CARE INSTRUCTIONS  Warning: This infection is contagious. Do not have  sex until treatment is completed. Follow up at your caregiver's office or the clinic to which you were referred. If your diagnosis (learning what is wrong) is confirmed by culture or some other method, your recent sexual contacts need treatment. Even if they are symptom free or have a negative culture or evaluation, they should be treated.  PREVENTION  Women should use sanitary pads instead of tampons for vaginal discharge.  Wipe front to back after using the toilet and avoid douching.   Practice safe sex, use condoms, have only one sex partner and be sure your sex partner is not having sex with others.  Ask your caregiver to test you for chlamydia at your regular checkups or sooner if you are having symptoms.  Ask for further information if you are pregnant.  SEEK IMMEDIATE MEDICAL CARE IF:  You develop an oral temperature above 102 F (38.9 C), not controlled by medications or lasting more than 2 days.  You develop an increase in pain.  You develop any type of abnormal discharge.  You develop vaginal bleeding and it is not time for your period.  You develop painful intercourse.   Bacterial Vaginosis  Bacterial vaginosis (BV) is a vaginal infection where the normal balance of bacteria in the vagina is disrupted. This is not a sexually transmitted disease and your sexual partners do NOT need to be treated. CAUSES  The cause of BV is not fully understood. BV develops when there is an increase or imbalance of harmful bacteria.  Some activities or behaviors can upset the normal balance of bacteria in the vagina and put women at increased risk including:  Having  a new sex partner or multiple sex partners.  Douching.  Using an intrauterine device (IUD) for contraception.  It is not clear what role sexual activity plays in the development of BV. However, women that have never had sexual intercourse are rarely infected with BV.  Women do not get BV from toilet seats, bedding, swimming pools or from  touching objects around them.   SYMPTOMS  Grey vaginal discharge.  A fish-like odor with discharge, especially after sexual intercourse.  Itching or burning of the vagina and vulva.  Burning or pain with urination.  Some women have no signs or symptoms at all.   TREATMENT  Sometimes BV will clear up without treatment.  BV may be treated with antibiotics.  BV can recur after treatment. If this happens, a second round of antibiotics will often be prescribed.  HOME CARE INSTRUCTIONS  Finish all medication as directed by your caregiver.  Do not have sex until treatment is completed.  Do NOT drink any alcoholic beverages while being treated  with Metronidazole (Flagyl). This will cause a severe reaction inducing vomiting.

## 2013-11-01 NOTE — ED Notes (Signed)
Called Mother to come and get the patient.  Mother stated she would have to take a taxi to her house.  This nurse asked if she was going to pay or the taxi and the mother became offended.  Explained I was asking because we have some that are unable to pay for the taxi.  This nurse apologized and the Mother hung up the phone.  Mother's number [phone]

## 2013-11-01 NOTE — ED Notes (Signed)
Hot tea given upon request

## 2013-11-02 NOTE — ED Provider Notes (Signed)
Medical screening examination/treatment/procedure(s) were performed by non-physician practitioner and as supervising physician I was immediately available for consultation/collaboration.   EKG Interpretation None        Gwyneth SproutWhitney Rahil Passey, MD 11/02/13 1558

## 2014-07-14 ENCOUNTER — Emergency Department (HOSPITAL_COMMUNITY)
Admission: EM | Admit: 2014-07-14 | Discharge: 2014-07-15 | Disposition: A | Discharge status: Home / Self Care | Payer: BC Managed Care – PPO | Attending: Emergency Medicine | Admitting: Emergency Medicine

## 2014-07-14 ENCOUNTER — Encounter (HOSPITAL_COMMUNITY): Payer: Self-pay | Admitting: *Deleted

## 2014-07-14 DIAGNOSIS — F419 Anxiety disorder, unspecified: Secondary | ICD-10-CM | POA: Diagnosis not present

## 2014-07-14 DIAGNOSIS — Y9289 Other specified places as the place of occurrence of the external cause: Secondary | ICD-10-CM | POA: Diagnosis not present

## 2014-07-14 DIAGNOSIS — Y998 Other external cause status: Secondary | ICD-10-CM | POA: Diagnosis not present

## 2014-07-14 DIAGNOSIS — Z792 Long term (current) use of antibiotics: Secondary | ICD-10-CM | POA: Diagnosis not present

## 2014-07-14 DIAGNOSIS — Z23 Encounter for immunization: Secondary | ICD-10-CM | POA: Diagnosis not present

## 2014-07-14 DIAGNOSIS — Y9389 Activity, other specified: Secondary | ICD-10-CM | POA: Insufficient documentation

## 2014-07-14 DIAGNOSIS — W228XXA Striking against or struck by other objects, initial encounter: Secondary | ICD-10-CM | POA: Insufficient documentation

## 2014-07-14 DIAGNOSIS — S0591XA Unspecified injury of right eye and orbit, initial encounter: Secondary | ICD-10-CM | POA: Diagnosis present

## 2014-07-14 DIAGNOSIS — S0501XA Injury of conjunctiva and corneal abrasion without foreign body, right eye, initial encounter: Secondary | ICD-10-CM | POA: Diagnosis not present

## 2014-07-14 MED ORDER — IBUPROFEN 400 MG PO TABS
400.0000 mg | ORAL_TABLET | Freq: Once | ORAL | Status: AC
  Administered 2014-07-14: 400 mg via ORAL
  Filled 2014-07-14: qty 1

## 2014-07-14 NOTE — ED Notes (Signed)
The pt is c/o rt eye pain her cell phone was broken and a piece of it went into her rt eye.  Pain since then.  It occurred 2 hours ago

## 2014-07-15 MED ORDER — HYDROCODONE-ACETAMINOPHEN 5-325 MG PO TABS
2.0000 | ORAL_TABLET | Freq: Once | ORAL | Status: AC
  Administered 2014-07-15: 2 via ORAL
  Filled 2014-07-15: qty 2

## 2014-07-15 MED ORDER — ERYTHROMYCIN 5 MG/GM OP OINT
1.0000 "application " | TOPICAL_OINTMENT | Freq: Four times a day (QID) | OPHTHALMIC | Status: DC
  Administered 2014-07-15: 1 via OPHTHALMIC
  Filled 2014-07-15: qty 3.5

## 2014-07-15 MED ORDER — TETRACAINE HCL 0.5 % OP SOLN
2.0000 [drp] | Freq: Once | OPHTHALMIC | Status: AC
  Administered 2014-07-15: 2 [drp] via OPHTHALMIC
  Filled 2014-07-15: qty 2

## 2014-07-15 MED ORDER — FLUORESCEIN SODIUM 1 MG OP STRP
1.0000 | ORAL_STRIP | Freq: Once | OPHTHALMIC | Status: AC
  Administered 2014-07-15: 1 via OPHTHALMIC
  Filled 2014-07-15: qty 1

## 2014-07-15 MED ORDER — TETANUS-DIPHTH-ACELL PERTUSSIS 5-2.5-18.5 LF-MCG/0.5 IM SUSP
0.5000 mL | Freq: Once | INTRAMUSCULAR | Status: AC
  Administered 2014-07-15: 0.5 mL via INTRAMUSCULAR
  Filled 2014-07-15: qty 0.5

## 2014-07-15 NOTE — ED Provider Notes (Signed)
CSN: [id number]     Arrival date & time 07/14/14  2110 History   First MD Initiated Contact with Patient 07/15/14 0028     Chief Complaint  Patient presents with  . Eye Injury     (Consider location/radiation/quality/duration/timing/severity/associated sxs/prior Treatment) HPI [name] is a 18 year old female with past medical history of anxiety who presents the ER playing of eye pain. Patient states she was hitting herself in with a hammer, which resulted in a piece of the cell phone to fly up and hit her in the eye. Patient reports pain immediately afterwards. Patient reports irritation of her eye, denies change in visual acuity, visual loss, headache, blurred vision, diplopia.   Past Medical History  Diagnosis Date  . Anxiety    Past Surgical History  Procedure Laterality Date  . Tonsillectomy     No family history on file. History  Substance Use Topics  . Smoking status: Never Smoker   . Smokeless tobacco: Never Used  . Alcohol Use: Yes     Comment: occ   OB History    No data available     Review of Systems  Constitutional: Negative for fever.  Eyes: Positive for pain and redness. Negative for photophobia and visual disturbance.  Respiratory: Negative for shortness of breath.   Cardiovascular: Negative for chest pain.  Gastrointestinal: Negative for nausea, vomiting and abdominal pain.  Genitourinary: Negative for dysuria.  Skin: Negative for rash.  Neurological: Negative for dizziness, syncope, weakness, numbness and headaches.  Psychiatric/Behavioral: Negative.       Allergies  Other  Home Medications   Prior to Admission medications   Medication Sig Start Date End Date Taking? Authorizing Provider  cephALEXin (KEFLEX) 500 MG capsule Take 500 mg by mouth 3 (three) times daily. Started medication today 10-31-13 10/29/13   Brandt LoosenJulie Manly, MD  diphenhydrAMINE (BENADRYL) 25 MG tablet Take 1 tablet (25 mg total) by mouth every 6 (six) hours. 11/01/13   Mellody DrownLauren  Parker, PA-C  LORazepam (ATIVAN) 0.5 MG tablet Take 0.5 mg by mouth every 4 (four) hours as needed for anxiety.     Historical Provider, MD  meclizine (ANTIVERT) 25 MG tablet Take 25 mg by mouth daily as needed. 10/20/13   Arthor CaptainAbigail Harris, PA-C  medroxyPROGESTERone (DEPO-PROVERA) 150 MG/ML injection Inject 150 mg into the muscle every 3 (three) months. Last dose was two weeks ago from today (10-31-13)    Historical Provider, MD  metoCLOPramide (REGLAN) 10 MG tablet Take 1 tablet (10 mg total) by mouth 3 (three) times daily as needed for nausea (headache / nausea). Take with benadryl 11/01/13   Mellody DrownLauren Parker, PA-C  promethazine (PHENERGAN) 25 MG suppository Place 1 suppository (25 mg total) rectally every 6 (six) hours as needed for nausea or vomiting. 11/01/13   Mellody DrownLauren Parker, PA-C  promethazine (PHENERGAN) 25 MG tablet Take 25 mg by mouth every 6 (six) hours as needed for nausea or vomiting.    Historical Provider, MD  traMADol (ULTRAM) 50 MG tablet Take 50 mg by mouth every 6 (six) hours as needed. 10/29/13   Brandt LoosenJulie Manly, MD   BP 118/74 mmHg  Pulse 68  Temp(Src) 98 F (36.7 C) (Oral)  Resp 16  Ht 5\' 4"  (1.626 m)  SpO2 100% Physical Exam  Constitutional: She is oriented to person, place, and time. She appears well-developed and well-nourished. No distress.  HENT:  Head: Normocephalic and atraumatic.  Eyes: EOM and lids are normal. Pupils are equal, round, and reactive to light. Lids are everted  and swept, no foreign bodies found. Right eye exhibits no chemosis and no discharge. No foreign body present in the right eye. Left eye exhibits no discharge. Right conjunctiva is injected. Right conjunctiva has no hemorrhage. No scleral icterus. Right eye exhibits no nystagmus. Left eye exhibits no nystagmus.  Slit lamp exam:      The right eye shows corneal abrasion and fluorescein uptake. The right eye shows no foreign body, no hyphema, no hypopyon and no anterior chamber bulge.    On examination with   Woods lamp, approximately 1 cm region of fluorescein uptake noted to cornea just inferior to pupil as noted in graphic oh image. No visible retained foreign bodies.  Neck: Normal range of motion.  Pulmonary/Chest: Effort normal. No respiratory distress.  Musculoskeletal: Normal range of motion.  Neurological: She is alert and oriented to person, place, and time.  Skin: Skin is warm and dry. She is not diaphoretic.  Psychiatric: She has a normal mood and affect.  Nursing note and vitals reviewed.   ED Course  Procedures (including critical care time) Labs Review Labs Reviewed - No data to display  Imaging Review No results found.   EKG Interpretation None      MDM   Final diagnoses:  Corneal abrasion, right, initial encounter    Eye pain after hitting cell phone with a hammer, sulfa and broke and pieces of the cell phone when it's patient's right eye. Mild erythema noted to conjunctiva. No change in visual acuity. Woods lamp exam remarkable for abrasion just inferior to cornea.   Pt with corneal abrasion on PE. Tdap given. Eye irrigated w NS, no evidence of FB.  No change in vision, acuity equal bilaterally.  Pt is not a contact lens wearer.  Exam non-concerning for orbital cellulitis, hyphema, corneal ulcers. Patient will be discharged home with erythromycin.   Patient understands to follow up with ophthalmology, & to return to ER if new symptoms develop including change in vision, purulent drainage, or entrapment. I discussed these return precautions with patient and patient was agreeable to this plan. I encouraged patient to call or return to the ER should she have any questions or concerns.  BP 118/74 mmHg  Pulse 68  Temp(Src) 98 F (36.7 C) (Oral)  Resp 16  Ht 5\' 4"  (1.626 m)  SpO2 100%  Signed,  Ladona MowJoe Tatsuo Musial, PA-C 3:22 AM      Monte FantasiaJoseph W Eudell Julian, PA-C 07/15/14 0322  Monte FantasiaJoseph W Noelie Renfrow, PA-C 07/15/14 [id number]  Loren Raceravid Yelverton, MD 07/16/14 [phone]

## 2014-07-15 NOTE — Discharge Instructions (Signed)
Follow up with opthalmology.  Return to the ER with any worsening of your eye pain, visual changes, swelling.    Corneal Abrasion The cornea is the clear covering at the front and center of the eye. When looking at the colored portion of the eye (iris), you are looking through the cornea. This very thin tissue is made up of many layers. The surface layer is a single layer of cells (corneal epithelium) and is one of the most sensitive tissues in the body. If a scratch or injury causes the corneal epithelium to come off, it is called a corneal abrasion. If the injury extends to the tissues below the epithelium, the condition is called a corneal ulcer. CAUSES   Scratches.  Trauma.  Foreign body in the eye. Some people have recurrences of abrasions in the area of the original injury even after it has healed (recurrent erosion syndrome). Recurrent erosion syndrome generally improves and goes away with time. SYMPTOMS   Eye pain.  Difficulty or inability to keep the injured eye open.  The eye becomes very sensitive to light.  Recurrent erosions tend to happen suddenly, first thing in the morning, usually after waking up and opening the eye. DIAGNOSIS  Your health care provider can diagnose a corneal abrasion during an eye exam. Dye is usually placed in the eye using a drop or a small paper strip moistened by your tears. When the eye is examined with a special light, the abrasion shows up clearly because of the dye. TREATMENT   Small abrasions may be treated with antibiotic drops or ointment alone.  A pressure patch may be put over the eye. If this is done, follow your doctor's instructions for when to remove the patch. Do not drive or use machines while the eye patch is on. Judging distances is hard to do with a patch on. If the abrasion becomes infected and spreads to the deeper tissues of the cornea, a corneal ulcer can result. This is serious because it can cause corneal scarring. Corneal  scars interfere with light passing through the cornea and cause a loss of vision in the involved eye. HOME CARE INSTRUCTIONS  Use medicine or ointment as directed. Only take over-the-counter or prescription medicines for pain, discomfort, or fever as directed by your health care provider.  Do not drive or operate machinery if your eye is patched. Your ability to judge distances is impaired.  If your health care provider has given you a follow-up appointment, it is very important to keep that appointment. Not keeping the appointment could result in a severe eye infection or permanent loss of vision. If there is any problem keeping the appointment, let your health care provider know. SEEK MEDICAL CARE IF:   You have pain, light sensitivity, and a scratchy feeling in one eye or both eyes.  Your pressure patch keeps loosening up, and you can blink your eye under the patch after treatment.  Any kind of discharge develops from the eye after treatment or if the lids stick together in the morning.  You have the same symptoms in the morning as you did with the original abrasion days, weeks, or months after the abrasion healed. MAKE SURE YOU:   Understand these instructions.  Will watch your condition.  Will get help right away if you are not doing well or get worse. Document Released: 07/02/2000 Document Revised: 07/10/2013 Document Reviewed: 03/12/2013 [facility] LLCExit[facility] Patient Information 2015 [employer] [place]Care, [place]LL[place]. This information is not intended to replace advice given to  you by your health care provider. Make sure you discuss any questions you have with your health care provider.

## 2014-07-25 ENCOUNTER — Emergency Department (HOSPITAL_COMMUNITY)
Admission: EM | Admit: 2014-07-25 | Discharge: 2014-07-25 | Discharge status: Against Medical Advice | Payer: Medicaid Other | Attending: Emergency Medicine | Admitting: Emergency Medicine

## 2014-07-25 ENCOUNTER — Encounter (HOSPITAL_COMMUNITY): Payer: Self-pay | Admitting: Emergency Medicine

## 2014-07-25 DIAGNOSIS — Z3202 Encounter for pregnancy test, result negative: Secondary | ICD-10-CM | POA: Diagnosis not present

## 2014-07-25 DIAGNOSIS — Z79899 Other long term (current) drug therapy: Secondary | ICD-10-CM | POA: Diagnosis not present

## 2014-07-25 DIAGNOSIS — Z793 Long term (current) use of hormonal contraceptives: Secondary | ICD-10-CM | POA: Diagnosis not present

## 2014-07-25 DIAGNOSIS — R1110 Vomiting, unspecified: Secondary | ICD-10-CM | POA: Diagnosis present

## 2014-07-25 DIAGNOSIS — R1115 Cyclical vomiting syndrome unrelated to migraine: Secondary | ICD-10-CM

## 2014-07-25 DIAGNOSIS — G43A0 Cyclical vomiting, not intractable: Secondary | ICD-10-CM | POA: Diagnosis not present

## 2014-07-25 DIAGNOSIS — F419 Anxiety disorder, unspecified: Secondary | ICD-10-CM | POA: Insufficient documentation

## 2014-07-25 LAB — COMPREHENSIVE METABOLIC PANEL
ALBUMIN: 4.1 g/dL (ref 3.5–5.2)
ALK PHOS: 51 U/L (ref 39–117)
ALT: 18 U/L (ref 0–35)
AST: 27 U/L (ref 0–37)
Anion gap: 10 (ref 5–15)
BUN: 9 mg/dL (ref 6–23)
CALCIUM: 8.9 mg/dL (ref 8.4–10.5)
CHLORIDE: 111 meq/L (ref 96–112)
CO2: 18 mmol/L — AB (ref 19–32)
Creatinine, Ser: 0.83 mg/dL (ref 0.50–1.10)
GFR calc non Af Amer: >90 mL/min (ref >90)
GLUCOSE: 116 mg/dL — AB (ref 70–99)
POTASSIUM: 3.9 mmol/L (ref 3.5–5.1)
SODIUM: 139 mmol/L (ref 135–145)
Total Bilirubin: 1.1 mg/dL (ref 0.3–1.2)
Total Protein: 6.7 g/dL (ref 6.0–8.3)

## 2014-07-25 LAB — CBC WITH DIFFERENTIAL/PLATELET
Basophils Absolute: 0 10*3/uL (ref 0.0–0.1)
Basophils Relative: 0 % (ref 0–1)
Eosinophils Absolute: 0 10*3/uL (ref 0.0–0.7)
Eosinophils Relative: 0 % (ref 0–5)
HEMATOCRIT: 42.4 % (ref 36.0–46.0)
Hemoglobin: 14.3 g/dL (ref 12.0–15.0)
LYMPHS ABS: 0.4 10*3/uL — AB (ref 0.7–4.0)
LYMPHS PCT: 5 % — AB (ref 12–46)
MCH: 33.8 pg (ref 26.0–34.0)
MCHC: 33.7 g/dL (ref 30.0–36.0)
MCV: 100.2 fL — ABNORMAL HIGH (ref 78.0–100.0)
Monocytes Absolute: 0.1 10*3/uL (ref 0.1–1.0)
Monocytes Relative: 1 % — ABNORMAL LOW (ref 3–12)
Neutro Abs: 7.5 10*3/uL (ref 1.7–7.7)
Neutrophils Relative %: 94 % — ABNORMAL HIGH (ref 43–77)
Platelets: 190 10*3/uL (ref 150–400)
RBC: 4.23 MIL/uL (ref 3.87–5.11)
RDW: 11.7 % (ref 11.5–15.5)
WBC: 7.9 10*3/uL (ref 4.0–10.5)

## 2014-07-25 LAB — URINALYSIS, ROUTINE W REFLEX MICROSCOPIC
Bilirubin Urine: NEGATIVE
Glucose, UA: NEGATIVE mg/dL
Ketones, ur: >80 mg/dL — AB
Nitrite: NEGATIVE
Protein, ur: NEGATIVE mg/dL
SPECIFIC GRAVITY, URINE: 1.014 (ref 1.005–1.030)
Urobilinogen, UA: 0.2 mg/dL (ref 0.0–1.0)
pH: 7 (ref 5.0–8.0)

## 2014-07-25 LAB — URINE MICROSCOPIC-ADD ON

## 2014-07-25 LAB — POC URINE PREG, ED: Preg Test, Ur: NEGATIVE

## 2014-07-25 LAB — LIPASE, BLOOD: LIPASE: 21 U/L (ref 11–59)

## 2014-07-25 MED ORDER — ONDANSETRON HCL 4 MG/2ML IJ SOLN
4.0000 mg | Freq: Once | INTRAMUSCULAR | Status: AC
  Administered 2014-07-25: 4 mg via INTRAVENOUS
  Filled 2014-07-25: qty 2

## 2014-07-25 MED ORDER — ONDANSETRON 4 MG PO TBDP
8.0000 mg | ORAL_TABLET | Freq: Once | ORAL | Status: AC
  Administered 2014-07-25: 8 mg via ORAL
  Filled 2014-07-25: qty 2

## 2014-07-25 MED ORDER — SODIUM CHLORIDE 0.9 % IV BOLUS (SEPSIS)
1000.0000 mL | Freq: Once | INTRAVENOUS | Status: AC
  Administered 2014-07-25: 1000 mL via INTRAVENOUS

## 2014-07-25 MED ORDER — CEPHALEXIN 500 MG PO CAPS: 500.0000 mg | ORAL_CAPSULE | Freq: Four times a day (QID) | ORAL | Status: DC

## 2014-07-25 MED ORDER — PROMETHAZINE HCL 25 MG/ML IJ SOLN
6.2500 mg | Freq: Once | INTRAMUSCULAR | Status: AC
  Administered 2014-07-25: 6.25 mg via INTRAVENOUS
  Filled 2014-07-25: qty 1

## 2014-07-25 MED ORDER — PROMETHAZINE HCL 25 MG/ML IJ SOLN
25.0000 mg | Freq: Once | INTRAMUSCULAR | Status: AC
  Administered 2014-07-25: 25 mg via INTRAVENOUS
  Filled 2014-07-25: qty 1

## 2014-07-25 MED ORDER — ONDANSETRON HCL 4 MG PO TABS: 4.0000 mg | ORAL_TABLET | Freq: Three times a day (TID) | ORAL | Status: DC | PRN

## 2014-07-25 NOTE — ED Notes (Signed)
Pt continues to vomit in waiting room despite attempt to medicate with ODT zofran. Will place in next available room.

## 2014-07-25 NOTE — ED Notes (Signed)
Pt vomiting in room, PA aware

## 2014-07-25 NOTE — ED Notes (Signed)
Pt brought from triage in w/c with dry heaves -- hx of "cyclic vomiting" per pt, diaphoretic,

## 2014-07-25 NOTE — ED Notes (Signed)
Pt took pregnancy test this morning and positive and been vomiting.

## 2014-07-25 NOTE — ED Notes (Signed)
MD aware that patient continues to vomit, also aware that patient is unsteady on feet and is having trouble staying awake to carry on a conversation, states not to discharge at this time, orders given

## 2014-07-25 NOTE — ED Provider Notes (Signed)
CSN: [id number]     Arrival date & time 07/25/14  1304 History   First MD Initiated Contact with Patient 07/25/14 1408     Chief Complaint  Patient presents with  . Emesis During Pregnancy     (Consider location/radiation/quality/duration/timing/severity/associated sxs/prior Treatment) Patient is a 19 y.o. female presenting with vomiting. The history is provided by the patient and a friend.  Emesis Severity:  Severe Duration:  1 day Timing:  Constant Number of daily episodes:  TNTC Quality:  Undigested food Progression:  Worsening Chronicity:  Recurrent (Patient has cyclic vomiting syndrome, reports this is similar to prior episodes.) Recent urination:  Normal Relieved by:  Nothing Worsened by:  Nothing tried Ineffective treatments:  None tried Associated symptoms: no abdominal pain, no arthralgias, no chills, no cough, no diarrhea, no headaches, no myalgias and no sore throat   Risk factors: pregnant now (has postive urine pregnancy test this morning)     Past Medical History  Diagnosis Date  . Anxiety    Past Surgical History  Procedure Laterality Date  . Tonsillectomy     History reviewed. No pertinent family history. History  Substance Use Topics  . Smoking status: Never Smoker   . Smokeless tobacco: Never Used  . Alcohol Use: Yes     Comment: occ   OB History    No data available     Review of Systems  Constitutional: Negative for fever, chills, diaphoresis, activity change, appetite change and fatigue.  HENT: Negative for facial swelling, rhinorrhea, sore throat, trouble swallowing and voice change.   Eyes: Negative for photophobia, pain and visual disturbance.  Respiratory: Negative for cough, shortness of breath, wheezing and stridor.   Cardiovascular: Negative for chest pain, palpitations and leg swelling.  Gastrointestinal: Positive for vomiting. Negative for nausea, abdominal pain, diarrhea, constipation and anal bleeding.  Endocrine: Negative.    Genitourinary: Negative for dysuria, vaginal bleeding, vaginal discharge and vaginal pain.  Musculoskeletal: Negative for myalgias, back pain and arthralgias.  Skin: Negative.  Negative for rash.  Allergic/Immunologic: Negative.   Neurological: Negative for dizziness, tremors, syncope, weakness and headaches.  Psychiatric/Behavioral: Negative for suicidal ideas, sleep disturbance and self-injury.  All other systems reviewed and are negative.     Allergies  Other  Home Medications   Prior to Admission medications   Medication Sig Start Date End Date Taking? Authorizing Provider  cephALEXin (KEFLEX) 500 MG capsule Take 500 mg by mouth 3 (three) times daily. Started medication today 10-31-13 10/29/13   Brandt Loosen, MD  cephALEXin (KEFLEX) 500 MG capsule Take 1 capsule (500 mg total) by mouth 4 (four) times daily. 07/25/14   Lula Olszewski, MD  diphenhydrAMINE (BENADRYL) 25 MG tablet Take 1 tablet (25 mg total) by mouth every 6 (six) hours. 11/01/13   Mellody Drown, PA-C  LORazepam (ATIVAN) 0.5 MG tablet Take 0.5 mg by mouth every 4 (four) hours as needed for anxiety.     Historical Provider, MD  meclizine (ANTIVERT) 25 MG tablet Take 25 mg by mouth daily as needed. 10/20/13   Arthor Captain, PA-C  medroxyPROGESTERone (DEPO-PROVERA) 150 MG/ML injection Inject 150 mg into the muscle every 3 (three) months. Last dose was two weeks ago from today (10-31-13)    Historical Provider, MD  metoCLOPramide (REGLAN) 10 MG tablet Take 1 tablet (10 mg total) by mouth 3 (three) times daily as needed for nausea (headache / nausea). Take with benadryl 11/01/13   Mellody Drown, PA-C  ondansetron (ZOFRAN) 4 MG tablet Take 1 tablet (4  mg total) by mouth every 8 (eight) hours as needed for nausea or vomiting. 07/25/14   Lula OlszewskiMike Vail Basista, MD  promethazine (PHENERGAN) 25 MG suppository Place 1 suppository (25 mg total) rectally every 6 (six) hours as needed for nausea or vomiting. 11/01/13   Mellody DrownLauren Parker, PA-C  promethazine  (PHENERGAN) 25 MG tablet Take 25 mg by mouth every 6 (six) hours as needed for nausea or vomiting.    Historical Provider, MD  traMADol (ULTRAM) 50 MG tablet Take 50 mg by mouth every 6 (six) hours as needed. 10/29/13   Brandt LoosenJulie Manly, MD   BP 137/77 mmHg  Pulse 85  Temp(Src) 98 F (36.7 C) (Oral)  Resp 16  Ht 5\' 4"  (1.626 m)  SpO2 100%  LMP 06/24/2014 Physical Exam  Constitutional: She is oriented to person, place, and time. She appears well-developed and well-nourished. No distress.  Patient appears non-toxic  HENT:  Head: Normocephalic and atraumatic.  Right Ear: External ear normal.  Left Ear: External ear normal.  Mouth/Throat: Oropharynx is clear and moist. No oropharyngeal exudate.  Eyes: Conjunctivae and EOM are normal. Pupils are equal, round, and reactive to light. No scleral icterus.  Neck: Normal range of motion. Neck supple. No JVD present. No tracheal deviation present. No thyromegaly present.  Cardiovascular: Normal rate, regular rhythm and intact distal pulses.  Exam reveals no gallop and no friction rub.   No murmur heard. Pulmonary/Chest: Effort normal and breath sounds normal. No respiratory distress. She has no wheezes. She has no rales.  Abdominal: Soft. Bowel sounds are normal. She exhibits no distension. There is no tenderness.  Musculoskeletal: Normal range of motion. She exhibits no edema or tenderness.  Neurological: She is alert and oriented to person, place, and time. No cranial nerve deficit. She exhibits normal muscle tone. Coordination normal.  5/5 strength in all 4 extremities. Normal Gait.   Skin: Skin is warm and dry. She is not diaphoretic. No pallor.  Psychiatric: She has a normal mood and affect. She expresses no homicidal and no suicidal ideation. She expresses no suicidal plans and no homicidal plans.  Nursing note and vitals reviewed.   ED Course  Procedures (including critical care time) Labs Review Labs Reviewed  URINALYSIS, ROUTINE W REFLEX  MICROSCOPIC - Abnormal; Notable for the following:    Hgb urine dipstick SMALL (*)    Ketones, ur >80 (*)    Leukocytes, UA MODERATE (*)    All other components within normal limits  URINE MICROSCOPIC-ADD ON - Abnormal; Notable for the following:    Bacteria, UA MANY (*)    All other components within normal limits  POC URINE PREG, ED    Imaging Review No results found.   EKG Interpretation None      MDM   Final diagnoses:  Non-intractable cyclical vomiting with nausea    The patient is a 19 year old female with a history of cyclic vomiting syndrome who presents with continued emesis since last night which she states is similar to her previous episodes of cyclic vomiting. Of note the patient took a urine pregnancy test this morning which was positive. The patient denies any abdominal pain or vaginal bleeding. Unsure of last menstrual period but estimates it was between 4 or 5 weeks ago. On exam patient is afebrile and hemodynamically stable. Abdomen is soft and nontender to palpation. Vomiting completely resolves with IV fluids and antiemetics. Prior to receiving antiemetics patient warned of potential teratogenic risks of antiemetics in pregnancy and reports understanding of this and  desire to still receive them.   Urine Pregnancy test comes back negative and patient made aware of this result. UA with moderate Leuks and many bacteria. Patient Given prescriptions for Keflex and Zofran and discharged with primary care follow-up and standard ED return precautions. Patient expresses understanding and agreement with this plan and is discharged home.  Patient seen with attending, Dr. Romeo Apple, who oversaw clinical decision making.   Lula Olszewski, MD 07/25/14 1657  Purvis Sheffield, MD 07/26/14 [phone]

## 2014-07-25 NOTE — ED Notes (Signed)
Pt left AMA while this RN was in another room, did not wait to speak with RN or MD

## 2014-07-25 NOTE — Discharge Instructions (Signed)
Cyclic Vomiting Syndrome °Cyclic vomiting syndrome is a benign condition in which patients experience bouts or cycles of severe nausea and vomiting that last for hours or even days. The bouts of nausea and vomiting alternate with longer periods of no symptoms and generally good health. Cyclic vomiting syndrome occurs mostly in children, but can affect adults. °CAUSES  °CVS has no known cause. Each episode is typically similar to the previous ones. The episodes tend to:  °· Start at about the same time of day. °· Last the same length of time. °· Present the same symptoms at the same level of intensity. °Cyclic vomiting syndrome can begin at any age in children and adults. Cyclic vomiting syndrome usually starts between the ages of 3 and 7 years. In adults, episodes tend to occur less often than they do in children, but they last longer. Furthermore, the events or situations that trigger episodes in adults cannot always be pinpointed as easily as they can in children. °There are 4 phases of cyclic vomiting syndrome: °1. Prodrome. The prodrome phase signals that an episode of nausea and vomiting is about to begin. This phase can last from just a few minutes to several hours. This phase is often marked by belly (abdominal) pain. Sometimes taking medicine early in the prodrome phase can stop an episode in progress. However, sometimes there is no warning. A person may simply wake up in the middle of the night or early morning and begin vomiting. °2. Episode. The episode phase consists of: °· Severe vomiting. °· Nausea. °· Gagging (retching). °3. Recovery. The recovery phase begins when the nausea and vomiting stop. Healthy color, appetite, and energy return. °4. Symptom-free interval. The symptom-free interval phase is the period between episodes when no symptoms are present. °TRIGGERS °Episodes can be triggered by an infection or event. Examples of triggers include: °· Infections. °· Colds, allergies, sinus problems, and  the flu. °· Eating certain foods such as chocolate or cheese. °· Foods with monosodium glutamate (MSG) or preservatives. °· Fast foods. °· Pre-packaged foods. °· Foods with low nutritional value (junk foods). °· Overeating. °· Eating just before going to bed. °· Hot weather. °· Dehydration. °· Not enough sleep or poor sleep quality. °· Physical exhaustion. °· Menstruation. °· Motion sickness. °· Emotional stress (school or home difficulties). °· Excitement or stress. °SYMPTOMS  °The main symptoms of cyclic vomiting syndrome are: °· Severe vomiting. °· Nausea. °· Gagging (retching). °Episodes usually begin at night or the first thing in the morning. Episodes may include vomiting or retching up to 5 or 6 times an hour during the worst of the episode. Episodes usually last anywhere from 1 to 4 days. Episodes can last for up to 10 days. Other symptoms include: °· Paleness. °· Exhaustion. °· Listlessness. °· Abdominal pain. °· Loose stools or diarrhea. °Sometimes the nausea and vomiting are so severe that a person appears to be almost unconscious. Sensitivity to light, headache, fever, dizziness, may also accompany an episode. In addition, the vomiting may cause drooling and excessive thirst. Drinking water usually leads to more vomiting, though the water can dilute the acid in the vomit, making the episode a little less painful. Continuous vomiting can lead to dehydration, which means that the body has lost excessive water and salts. °DIAGNOSIS  °Cyclic vomiting syndrome is hard to diagnose because there are no clear tests to identify it. A caregiver must diagnose cyclic vomiting syndrome by looking at symptoms and medical history. A caregiver must exclude more common diseases   or disorders that can also cause nausea and vomiting. Also, diagnosis takes time because caregivers need to identify a pattern or cycle to the vomiting. °TREATMENT  °Cyclic vomiting syndrome cannot be cured. Treatment varies, but people with  cyclic vomiting syndrome should get plenty of rest and sleep and take medications that prevent, stop, or lessen the vomiting episodes and other symptoms. °People whose episodes are frequent and long-lasting may be treated during the symptom-free intervals in an effort to prevent or ease future episodes. The symptom-free phase is a good time to eliminate anything known to trigger an episode. For example, if episodes are brought on by stress or excitement, this period is the time to find ways to reduce stress and stay calm. If sinus problems or allergies cause episodes, those conditions should be treated. The triggers listed above should be avoided or prevented. °Because of the similarities between migraine and cyclic vomiting syndrome, caregivers treat some people with severe cyclic vomiting syndrome with drugs that are also used for migraine headaches. The drugs are designed to: °· Prevent episodes. °· Reduce their frequency. °· Lessen their severity. °HOME CARE INSTRUCTIONS °Once a vomiting episode begins, treatment is supportive. It helps to stay in bed and sleep in a dark, quiet room. Severe nausea and vomiting may require hospitalization and intravenous (IV) fluids to prevent dehydration. Relaxing medications (sedatives) may help if the nausea continues. Sometimes, during the prodrome phase, it is possible to stop an episode from happening altogether. Only take over-the-counter or prescription medicines for pain, discomfort or fever as directed by your caregiver. Do not give aspirin to children. °During the recovery phase, drinking water and replacing lost electrolytes (salts in the blood) are very important. Electrolytes are salts that the body needs to function well and stay healthy. Symptoms during the recovery phase can vary. Some people find that their appetites return to normal immediately, while others need to begin by drinking clear liquids and then move slowly to solid food. °RELATED COMPLICATIONS °The  severe vomiting that defines cyclic vomiting syndrome is a risk factor for several complications: °· Dehydration--Vomiting causes the body to lose water quickly. °· Electrolyte imbalance--Vomiting also causes the body to lose the important salts it needs to keep working properly. °· Peptic esophagitis--The tube that connects the mouth to the stomach (esophagus) becomes injured from the stomach acid that comes up with the vomit. °· Hematemesis--The esophagus becomes irritated and bleeds, so blood mixes with the vomit. °· Mallory-Weiss tear--The lower end of the esophagus may tear open or the stomach may bruise from vomiting or retching. °· Tooth decay--The acid in the vomit can hurt the teeth by corroding the tooth enamel. °SEEK MEDICAL CARE IF: °You have questions or problems. °Document Released: 09/13/2001 Document Revised: 09/27/2011 Document Reviewed: 10/12/2010 °ExitCare® Patient Information ©2015 [employer]. This information is not intended to replace advice given to you by your health care provider. Make sure you discuss any questions you have with your health care provider. ° °

## 2014-08-06 IMAGING — US US TRANSVAGINAL NON-OB
1 series · 14 of 25 positions shown · non-contrast
Comparison: None

CLINICAL DATA: Vaginal discharge.

EXAM:
TRANSABDOMINAL AND TRANSVAGINAL ULTRASOUND OF PELVIS with color-flow
Doppler both ovaries .
TECHNIQUE: Both transabdominal and transvaginal ultrasound examinations of the
pelvis were performed. Transabdominal technique was performed for
global imaging of the pelvis including uterus, ovaries, adnexal
regions, and pelvic cul-de-sac. It was necessary to proceed with
endovaginal exam following the transabdominal exam to visualize the
uterus and endometrium. Color-flow Doppler both ovaries performed.

[Series 1: us transvaginal non-ob · 0.20mm/px · 57 acquisitions, 14 frames shown]
[im 1/57]
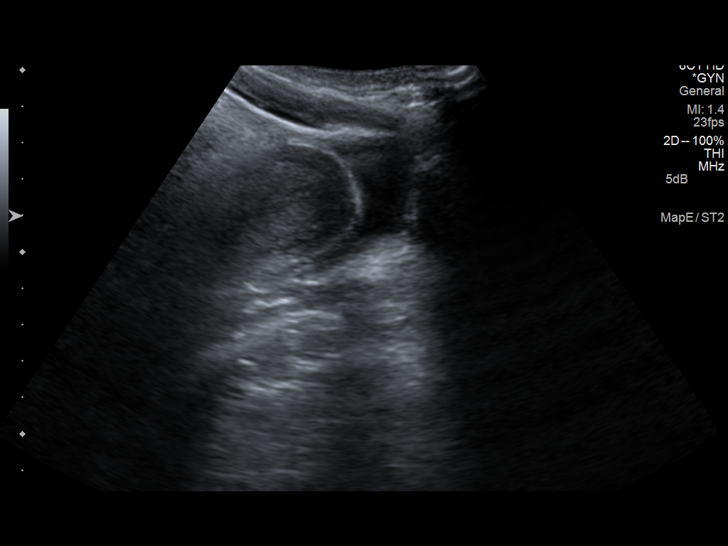
[im 5/57]
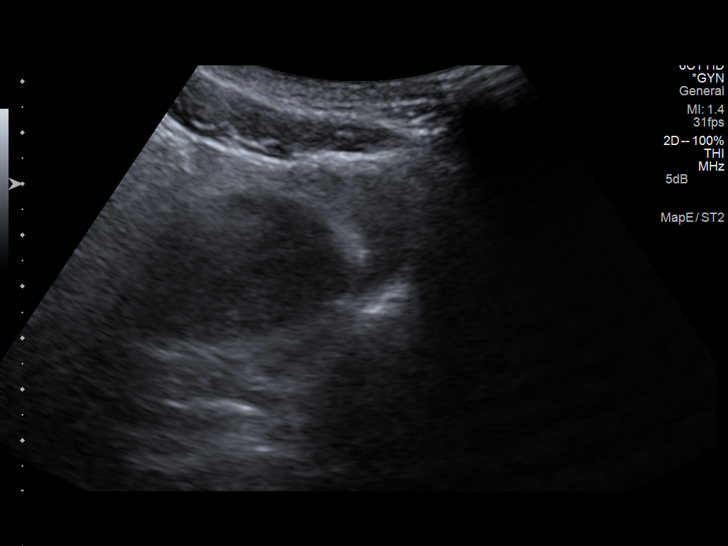
[im 10/57]
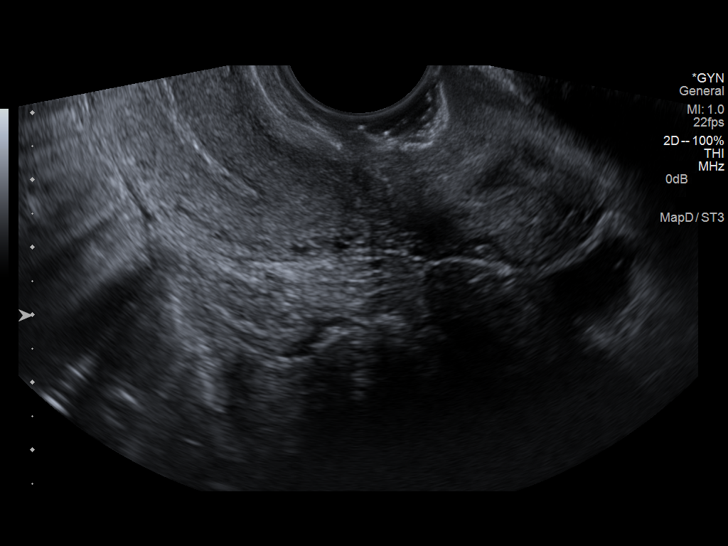
[im 15/57]
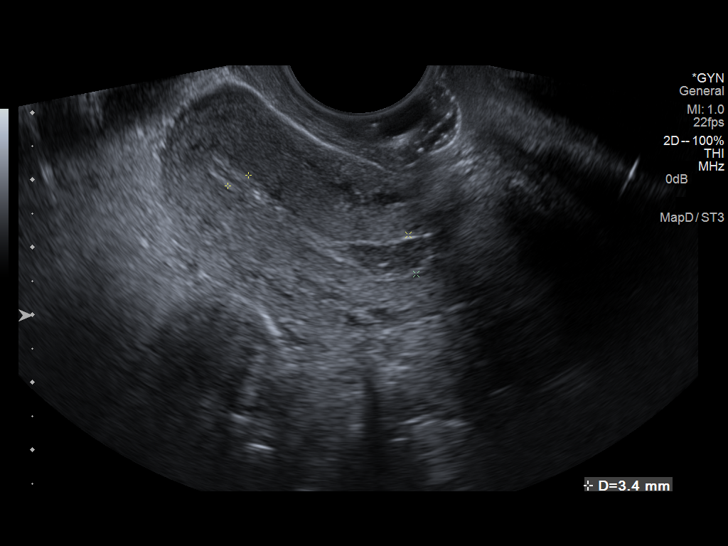
[im 19/57]
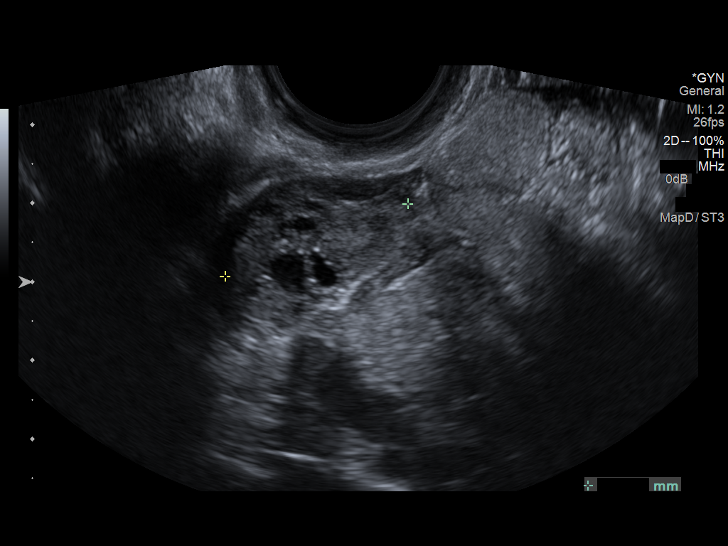
[im 22/57]
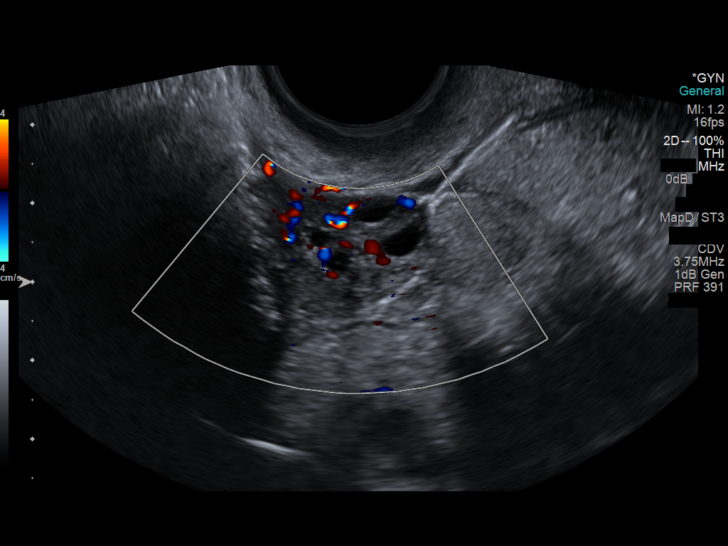
[im 26/57]
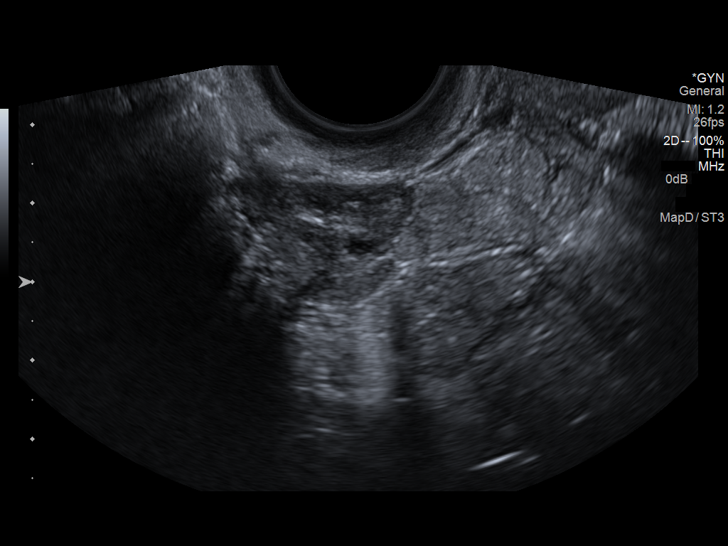
[im 31/57]
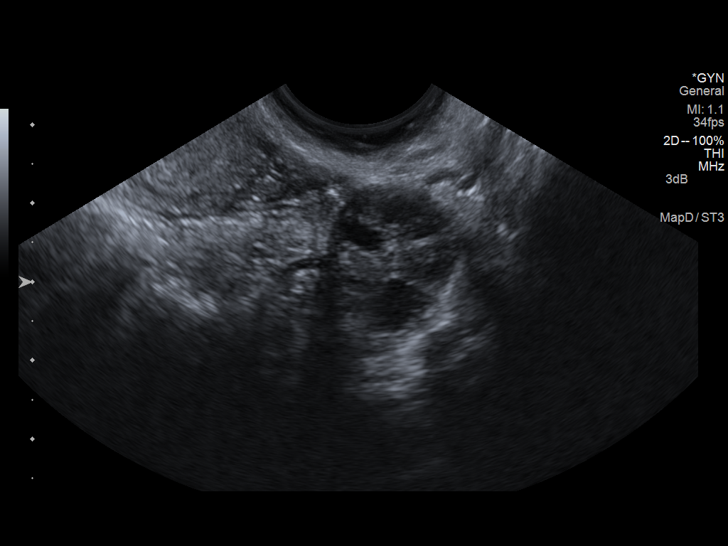
[im 36/57]
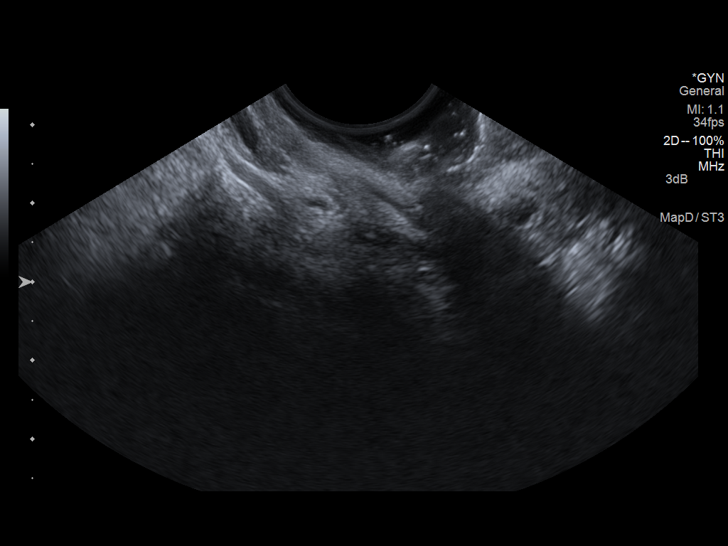
[im 38/57]
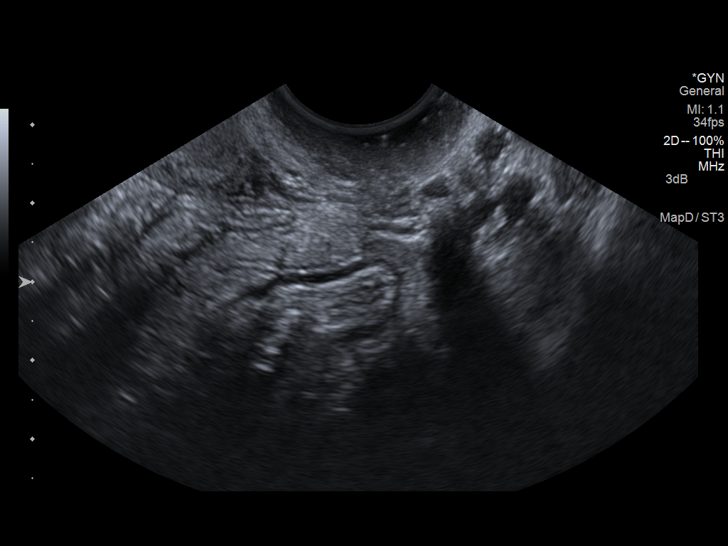
[im 43/57]
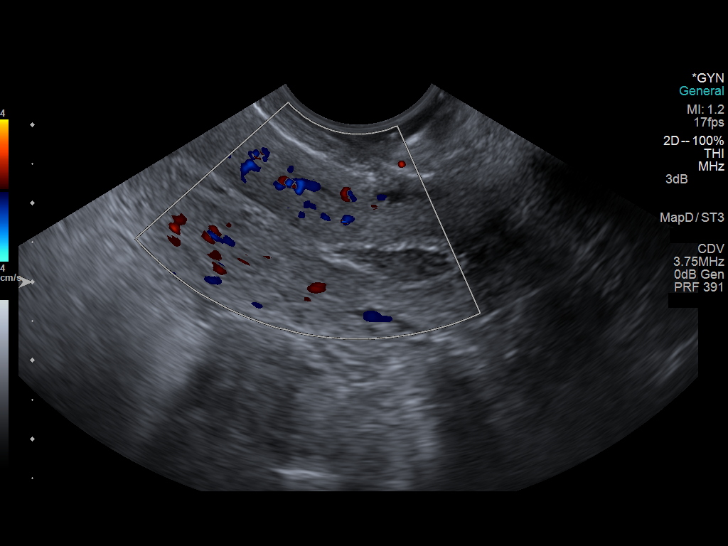
[im 47/57]
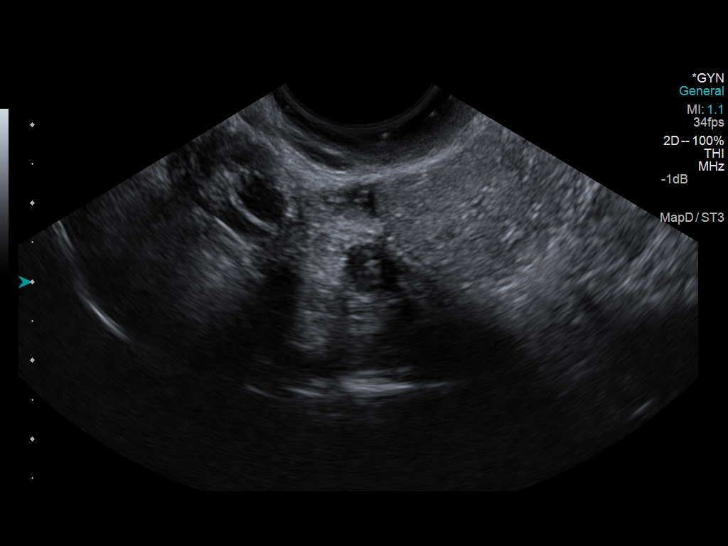
[im 52/57]
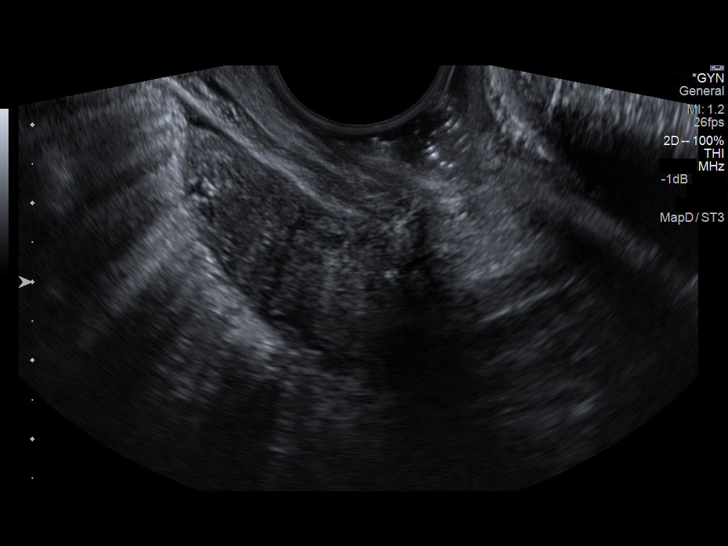
[im 57/57]
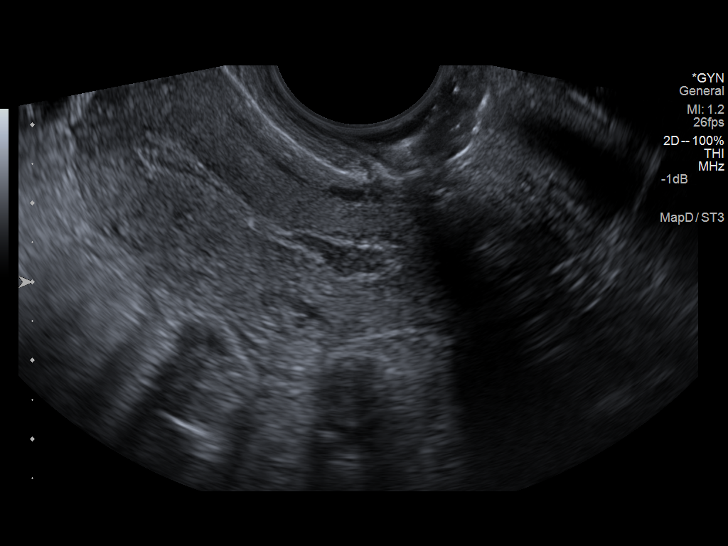

[14 of 25 positions shown; findings below may reference images not displayed]

FINDINGS: Uterus

Measurements: 7.3 x 2.5 x 4.7 cm. No fibroids or other mass
visualized.

Endometrium

Thickness: 3.4 mm.. Small amount of fluid is noted in the
endometrial canal.

Right ovary

Measurements: 2.7 x 1.8 x 2.6 cm.. Multiple small follicular cysts.
No significant mass. Normal color flow Doppler.

Left ovary

Measurements: 2.4 x 1.8 x 1.9 cm. Multiple follicular cysts. No
significant mass. Normal color flow and Doppler.

Other findings

No free fluid.
IMPRESSION: 1. Minimal amount of endometrial fluid.
2. Tiny bilateral follicular cysts.

## 2014-08-22 ENCOUNTER — Encounter (HOSPITAL_COMMUNITY): Payer: Self-pay | Admitting: *Deleted

## 2014-08-22 ENCOUNTER — Emergency Department (HOSPITAL_COMMUNITY)
Admission: EM | Admit: 2014-08-22 | Discharge: 2014-08-22 | Disposition: A | Discharge status: Home / Self Care | Payer: Medicaid Other | Attending: Emergency Medicine | Admitting: Emergency Medicine

## 2014-08-22 DIAGNOSIS — Z8659 Personal history of other mental and behavioral disorders: Secondary | ICD-10-CM | POA: Diagnosis not present

## 2014-08-22 DIAGNOSIS — R197 Diarrhea, unspecified: Secondary | ICD-10-CM | POA: Diagnosis not present

## 2014-08-22 DIAGNOSIS — R112 Nausea with vomiting, unspecified: Secondary | ICD-10-CM | POA: Insufficient documentation

## 2014-08-22 DIAGNOSIS — R1084 Generalized abdominal pain: Secondary | ICD-10-CM | POA: Insufficient documentation

## 2014-08-22 DIAGNOSIS — Z8669 Personal history of other diseases of the nervous system and sense organs: Secondary | ICD-10-CM | POA: Diagnosis not present

## 2014-08-22 DIAGNOSIS — E876 Hypokalemia: Secondary | ICD-10-CM | POA: Insufficient documentation

## 2014-08-22 DIAGNOSIS — Z3202 Encounter for pregnancy test, result negative: Secondary | ICD-10-CM | POA: Diagnosis not present

## 2014-08-22 LAB — I-STAT CHEM 8, ED
BUN: 18 mg/dL (ref 6–23)
CALCIUM ION: 1.18 mmol/L (ref 1.12–1.23)
CHLORIDE: 104 mmol/L (ref 96–112)
CREATININE: 0.9 mg/dL (ref 0.50–1.10)
Glucose, Bld: 118 mg/dL — ABNORMAL HIGH (ref 70–99)
HCT: 58 % — ABNORMAL HIGH (ref 36.0–46.0)
Hemoglobin: 19.7 g/dL — ABNORMAL HIGH (ref 12.0–15.0)
Potassium: 3.2 mmol/L — ABNORMAL LOW (ref 3.5–5.1)
Sodium: 141 mmol/L (ref 135–145)
TCO2: 19 mmol/L (ref 0–100)

## 2014-08-22 LAB — CBC WITH DIFFERENTIAL/PLATELET
BASOS PCT: 0 % (ref 0–1)
Basophils Absolute: 0 10*3/uL (ref 0.0–0.1)
EOS PCT: 0 % (ref 0–5)
Eosinophils Absolute: 0 10*3/uL (ref 0.0–0.7)
HCT: 51.8 % — ABNORMAL HIGH (ref 36.0–46.0)
HEMOGLOBIN: 18.4 g/dL — AB (ref 12.0–15.0)
Lymphocytes Relative: 11 % — ABNORMAL LOW (ref 12–46)
Lymphs Abs: 1.6 10*3/uL (ref 0.7–4.0)
MCH: 34.5 pg — AB (ref 26.0–34.0)
MCHC: 35.5 g/dL (ref 30.0–36.0)
MCV: 97.2 fL (ref 78.0–100.0)
MONO ABS: 0.6 10*3/uL (ref 0.1–1.0)
Monocytes Relative: 4 % (ref 3–12)
NEUTROS PCT: 85 % — AB (ref 43–77)
Neutro Abs: 12.8 10*3/uL — ABNORMAL HIGH (ref 1.7–7.7)
Platelets: 267 10*3/uL (ref 150–400)
RBC: 5.33 MIL/uL — AB (ref 3.87–5.11)
RDW: 11.9 % (ref 11.5–15.5)
WBC: 15.1 10*3/uL — ABNORMAL HIGH (ref 4.0–10.5)

## 2014-08-22 LAB — POC URINE PREG, ED: Preg Test, Ur: NEGATIVE

## 2014-08-22 MED ORDER — SODIUM CHLORIDE 0.9 % IV BOLUS (SEPSIS)
1000.0000 mL | Freq: Once | INTRAVENOUS | Status: AC
  Administered 2014-08-22: 1000 mL via INTRAVENOUS

## 2014-08-22 MED ORDER — ONDANSETRON HCL 4 MG/2ML IJ SOLN
4.0000 mg | Freq: Once | INTRAMUSCULAR | Status: AC
  Administered 2014-08-22: 4 mg via INTRAVENOUS
  Filled 2014-08-22: qty 2

## 2014-08-22 MED ORDER — PROMETHAZINE HCL 25 MG PO TABS: 25.0000 mg | ORAL_TABLET | Freq: Four times a day (QID) | ORAL | Status: DC | PRN

## 2014-08-22 MED ORDER — MORPHINE SULFATE 2 MG/ML IJ SOLN
1.0000 mg | Freq: Once | INTRAMUSCULAR | Status: AC
Start: 2014-08-22 — End: 2014-08-22
  Administered 2014-08-22: 1 mg via INTRAVENOUS
  Filled 2014-08-22: qty 1

## 2014-08-22 MED ORDER — PROMETHAZINE HCL 25 MG/ML IJ SOLN
25.0000 mg | Freq: Once | INTRAMUSCULAR | Status: AC
  Administered 2014-08-22: 25 mg via INTRAVENOUS
  Filled 2014-08-22 (×2): qty 1

## 2014-08-22 MED ORDER — POTASSIUM CHLORIDE CRYS ER 20 MEQ PO TBCR
40.0000 meq | EXTENDED_RELEASE_TABLET | Freq: Once | ORAL | Status: AC
  Administered 2014-08-22: 40 meq via ORAL
  Filled 2014-08-22: qty 2

## 2014-08-22 NOTE — ED Notes (Addendum)
Patient states she has nausea since Monday. Patient was just seen recently and was prescribed phenergan, but now she thinks she may be pregnant so she only took the phenergan once.

## 2014-08-22 NOTE — ED Provider Notes (Signed)
CSN: [id number]     Arrival date & time 08/22/14  0820 History   First MD Initiated Contact with Patient 08/22/14 0830     Chief Complaint  Patient presents with  . Nausea     (Consider location/radiation/quality/duration/timing/severity/associated sxs/prior Treatment) HPI  Angela Christian is a 19 y.o. female with PMH of anxiety, cyclic nausea vomiting presenting with nausea since Monday with vomiting today as well as diarrhea. Patient states she has had multiple emesis episodes that are nonbloody nonbilious. Patient has also had diarrhea without any blood. Patient concerned she may be pregnant and only took 1 Phenergan tablets. Did not provide much relief. Patient endorses generalized abdominal discomfort during vomiting episodes. She's never had any abdominal surgeries. She denies any urinary or pelvic complaints. LMP December and was irregular because she stopped Depo.   Past Medical History  Diagnosis Date  . Anxiety    Past Surgical History  Procedure Laterality Date  . Tonsillectomy     History reviewed. No pertinent family history. History  Substance Use Topics  . Smoking status: Never Smoker   . Smokeless tobacco: Never Used  . Alcohol Use: Yes     Comment: occ   OB History    No data available     Review of Systems 10 Systems reviewed and are negative for acute change except as noted in the HPI.    Allergies  Other  Home Medications   Prior to Admission medications   Medication Sig Start Date End Date Taking? Authorizing Provider  cephALEXin (KEFLEX) 500 MG capsule Take 1 capsule (500 mg total) by mouth 4 (four) times daily. Patient not taking: Reported on 08/22/2014 07/25/14   Lula OlszewskiMike Goebel, MD  diphenhydrAMINE (BENADRYL) 25 MG tablet Take 1 tablet (25 mg total) by mouth every 6 (six) hours. Patient not taking: Reported on 08/22/2014 11/01/13   Mellody DrownLauren Parker, PA-C  metoCLOPramide (REGLAN) 10 MG tablet Take 1 tablet (10 mg total) by mouth 3 (three) times daily as  needed for nausea (headache / nausea). Take with benadryl Patient not taking: Reported on 08/22/2014 11/01/13   Mellody DrownLauren Parker, PA-C  ondansetron (ZOFRAN) 4 MG tablet Take 1 tablet (4 mg total) by mouth every 8 (eight) hours as needed for nausea or vomiting. Patient not taking: Reported on 08/22/2014 07/25/14   Lula OlszewskiMike Goebel, MD  promethazine (PHENERGAN) 25 MG tablet Take 1 tablet (25 mg total) by mouth every 6 (six) hours as needed for nausea or vomiting. 08/22/14   Benetta SparVictoria L Fraya Ueda, PA-C   BP 131/70 mmHg  Pulse 93  Temp(Src) 98.8 F (37.1 C) (Oral)  Resp 16  SpO2 100%  LMP 06/24/2014 Physical Exam  Constitutional: She appears well-developed and well-nourished. No distress.  HENT:  Head: Normocephalic and atraumatic.  Dry mucous membranes.  Eyes: Conjunctivae and EOM are normal. Right eye exhibits no discharge. Left eye exhibits no discharge.  Cardiovascular: Normal rate, regular rhythm and normal heart sounds.   Pulmonary/Chest: Effort normal and breath sounds normal. No respiratory distress. She has no wheezes.  Abdominal: Soft. Bowel sounds are normal. She exhibits no distension.  Diffuse abdominal tenderness without rebound, rigidity, guarding. No CVA tenderness.  Neurological: She is alert. She exhibits normal muscle tone. Coordination normal.  Skin: Skin is warm and dry. She is not diaphoretic.  Nursing note and vitals reviewed.   ED Course  Procedures (including critical care time) Labs Review Labs Reviewed  CBC WITH DIFFERENTIAL/PLATELET - Abnormal; Notable for the following:    WBC 15.1 (*)  RBC 5.33 (*)    Hemoglobin 18.4 (*)    HCT 51.8 (*)    MCH 34.5 (*)    Neutrophils Relative % 85 (*)    Neutro Abs 12.8 (*)    Lymphocytes Relative 11 (*)    All other components within normal limits  I-STAT CHEM 8, ED - Abnormal; Notable for the following:    Potassium 3.2 (*)    Glucose, Bld 118 (*)    Hemoglobin 19.7 (*)    HCT 58.0 (*)    All other components within normal  limits  POC URINE PREG, ED    Imaging Review No results found.   EKG Interpretation None      Meds given in ED:  Medications  sodium chloride 0.9 % bolus 1,000 mL (0 mLs Intravenous Stopped 08/22/14 1037)  ondansetron (ZOFRAN) injection 4 mg (4 mg Intravenous Given 08/22/14 0920)  potassium chloride SA (K-DUR,KLOR-CON) CR tablet 40 mEq (40 mEq Oral Given 08/22/14 1247)  promethazine (PHENERGAN) injection 25 mg (25 mg Intravenous Given 08/22/14 1045)  sodium chloride 0.9 % bolus 1,000 mL (0 mLs Intravenous Stopped 08/22/14 1221)  promethazine (PHENERGAN) injection 25 mg (25 mg Intravenous Given 08/22/14 1137)  morphine 2 MG/ML injection 1 mg (1 mg Intravenous Given 08/22/14 1425)    Discharge Medication List as of 08/22/2014  2:09 PM    START taking these medications   Details  promethazine (PHENERGAN) 25 MG tablet Take 1 tablet (25 mg total) by mouth every 6 (six) hours as needed for nausea or vomiting., Starting 08/22/2014, Until Discontinued, Print          MDM   Final diagnoses:  Non-intractable vomiting with nausea, vomiting of unspecified type   Pt with history of cyclic nausea, vomiting syndrome presenting with nausea and diffuse abdominal tenderness. Pt concerned with taking phenergan due to possibility of being pregnant. Pt had been using depo injection with her last injection a little over 3 months ago. Urine pregnancy test negative. Pt without electrolyte abnormalities other than hypokalemia. Pt received and tolerated oral replenishment in ED. Pt also with elevated Hgb and Hct consistent with dehydration. Pt given 2 L fluid in ED. With antinausea medication pt tolerating fluids in ED. Patient is afebrile, nontoxic, and in no acute distress. Patient is appropriate for outpatient management and is stable for discharge. Pt encouraged to follow up with GI as well as PCP.  Discussed return precautions with patient. Discussed all results and patient verbalizes understanding and agrees with  plan.  Case has been discussed with Dr. Deretha Emory who agrees with the above plan and to discharge.    Louann Sjogren, PA-C 08/23/14 2117  Vanetta Mulders, MD 08/26/14 [phone]

## 2014-08-22 NOTE — ED Notes (Signed)
Bed: WA02 Expected date:  Expected time:  Means of arrival:  Comments: EMS- 18yo F, n/v/d x 3 days

## 2014-08-22 NOTE — ED Notes (Signed)
Pts mother contacted [facility]ay[facility] Semin[facility] for pickup

## 2014-08-22 NOTE — ED Notes (Signed)
Pt made aware that kdur is needed. Pt experiencing n/v will administer at later date

## 2014-08-22 NOTE — Discharge Instructions (Signed)
Return to the emergency room with worsening of symptoms, new symptoms or with symptoms that are concerning , especially fevers, abdominal pain in one area, unable to keep down fluids, blood in stool or vomit, severe pain, you feel faint, lightheaded or pass out. Drink plenty of fluids with electrolytes especially Gatorade.  Phenergan suppositories for nausea Please call your doctor for a followup appointment within 24-48 hours. When you talk to your doctor please let them know that you were seen in the emergency department and have them acquire all of your records so that they can discuss the findings with you and formulate a treatment plan to fully care for your new and ongoing problems.  Call to make appointment with GI for further workup with your nausea and vomiting. Number provided above. Read below information and follow all recommendations.   Nausea and Vomiting Nausea is a sick feeling that often comes before throwing up (vomiting). Vomiting is a reflex where stomach contents come out of your mouth. Vomiting can cause severe loss of body fluids (dehydration). Children and elderly adults can become dehydrated quickly, especially if they also have diarrhea. Nausea and vomiting are symptoms of a condition or disease. It is important to find the cause of your symptoms. CAUSES   Direct irritation of the stomach lining. This irritation can result from increased acid production (gastroesophageal reflux disease), infection, food poisoning, taking certain medicines (such as nonsteroidal anti-inflammatory drugs), alcohol use, or tobacco use.  Signals from the brain.These signals could be caused by a headache, heat exposure, an inner ear disturbance, increased pressure in the brain from injury, infection, a tumor, or a concussion, pain, emotional stimulus, or metabolic problems.  An obstruction in the gastrointestinal tract (bowel obstruction).  Illnesses such as diabetes, hepatitis, gallbladder  problems, appendicitis, kidney problems, cancer, sepsis, atypical symptoms of a heart attack, or eating disorders.  Medical treatments such as chemotherapy and radiation.  Receiving medicine that makes you sleep (general anesthetic) during surgery. DIAGNOSIS Your caregiver may ask for tests to be done if the problems do not improve after a few days. Tests may also be done if symptoms are severe or if the reason for the nausea and vomiting is not clear. Tests may include:  Urine tests.  Blood tests.  Stool tests.  Cultures (to look for evidence of infection).  X-rays or other imaging studies. Test results can help your caregiver make decisions about treatment or the need for additional tests. TREATMENT You need to stay well hydrated. Drink frequently but in small amounts.You may wish to drink water, sports drinks, clear broth, or eat frozen ice pops or gelatin dessert to help stay hydrated.When you eat, eating slowly may help prevent nausea.There are also some antinausea medicines that may help prevent nausea. HOME CARE INSTRUCTIONS   Take all medicine as directed by your caregiver.  If you do not have an appetite, do not force yourself to eat. However, you must continue to drink fluids.  If you have an appetite, eat a normal diet unless your caregiver tells you differently.  Eat a variety of complex carbohydrates (rice, wheat, potatoes, bread), lean meats, yogurt, fruits, and vegetables.  Avoid high-fat foods because they are more difficult to digest.  Drink enough water and fluids to keep your urine clear or pale yellow.  If you are dehydrated, ask your caregiver for specific rehydration instructions. Signs of dehydration may include:  Severe thirst.  Dry lips and mouth.  Dizziness.  Dark urine.  Decreasing urine frequency and  amount.  Confusion.  Rapid breathing or pulse. SEEK IMMEDIATE MEDICAL CARE IF:   You have blood or brown flecks (like coffee grounds) in  your vomit.  You have black or bloody stools.  You have a severe headache or stiff neck.  You are confused.  You have severe abdominal pain.  You have chest pain or trouble breathing.  You do not urinate at least once every 8 hours.  You develop cold or clammy skin.  You continue to vomit for longer than 24 to 48 hours.  You have a fever. MAKE SURE YOU:   Understand these instructions.  Will watch your condition.  Will get help right away if you are not doing well or get worse. Document Released: 07/05/2005 Document Revised: 09/27/2011 Document Reviewed: 12/02/2010 [facility] Patient Information 2015 [place], [place]. This information is not intended to replace advice given to you by your health care provider. Make sure you discuss any questions you have with your health care provider.

## 2014-08-27 ENCOUNTER — Ambulatory Visit (INDEPENDENT_AMBULATORY_CARE_PROVIDER_SITE_OTHER): Payer: Medicaid Other | Admitting: General Practice

## 2014-08-27 DIAGNOSIS — Z113 Encounter for screening for infections with a predominantly sexual mode of transmission: Secondary | ICD-10-CM

## 2014-08-27 DIAGNOSIS — Z3202 Encounter for pregnancy test, result negative: Secondary | ICD-10-CM

## 2014-08-27 NOTE — Progress Notes (Signed)
Patient here today for pregnancy test. Reports last depo injection September, due in December and hasn't really had a period since then just some spotting. Reports nausea started Monday and vomiting started Wednesday. Pregnancy test is negative. Discussed with patient that it is normal to miss periods after discontinuing birth control and it sounds like she had a GI upset last week. Patient verbalized understanding. States that she didn't want another pee test she wanted a blood test to know if she is pregnant and no where will do it. Discussed with patient that at this time there is no indication to do a blood test and that if she were pregnant now she would only be a 1-3 weeks since her tests are negative. Patient states she is a heavy drinker and smoker and needs to know sooner rather than later if she is pregnant. Asked patient if she is trying to get pregnant. Patient states not really, she isn't trying but isn't stopping it either. Told patient that would mean that she is trying to get pregnant then and if that is the case she should decrease her drinking and smoking or stop smoking all together since those things are not recommended in pregnancy. Patient verbalized understanding. Told patient that when she does become pregnant she can certainly start her care here or she can be seen here if she has any women's health needs. Patient asked if we did STD testing because last year she had gonorrhea and chlamydia and PID. Told patient that we do and if she would like, we can send her urine off today to test for gonorrhea and chlamydia. Patient verbalized understanding and states yes. Told patient the test takes a couple days to come back and we will only call if her results are positive. Patient verbalized understanding and had no other questions

## 2014-08-28 ENCOUNTER — Ambulatory Visit: Payer: Medicaid Other

## 2014-08-28 ENCOUNTER — Telehealth: Payer: Self-pay | Admitting: General Practice

## 2014-08-28 LAB — GC/CHLAMYDIA PROBE AMP, URINE
Chlamydia, Swab/Urine, PCR: POSITIVE — AB
GC Probe Amp, Urine: POSITIVE — AB

## 2014-08-28 MED ORDER — AZITHROMYCIN 500 MG PO TABS: 1000.0000 mg | ORAL_TABLET | Freq: Once | ORAL | Status: DC

## 2014-08-28 NOTE — Telephone Encounter (Signed)
Called patient, no answer- left message stating we are calling to reach you with important information please call us back at the clinics

## 2014-08-28 NOTE — Telephone Encounter (Signed)
-----   Message from Catalina AntiguaPeggy Constant, MD sent at 08/28/2014 12:56 PM EST ----- Please inform patient of positive gonorrhea and chlamydia infection. Patient will need to come in to receive ceftriaxone and azithromycin has been e-prescribed. Patient should inform partner and abstain for 7 days while they are both being treated  Thanks  Gigi GinPeggy

## 2014-08-28 NOTE — Addendum Note (Signed)
Addended by: Catalina AntiguaONSTANT, Jodie Leiner on: 08/28/2014 12:59 PM   Modules accepted: Orders

## 2014-08-29 LAB — POCT PREGNANCY, URINE: PREG TEST UR: NEGATIVE

## 2014-08-29 NOTE — Telephone Encounter (Signed)
STD card sent. Called patient and her mother answered stating she was at work and wasn't with Lysle PearlKeira but she got her results online and wants to know what she needs to do whether it's come here or the HD. Told her Lysle PearlKeira can come here anytime just not between 12-1. Patient's mother verbalized understanding and had no other questions

## 2014-09-03 ENCOUNTER — Ambulatory Visit (INDEPENDENT_AMBULATORY_CARE_PROVIDER_SITE_OTHER): Payer: Medicaid Other | Admitting: General Practice

## 2014-09-03 VITALS — BP 161/69 | HR 146 | Temp 98.2°F | Ht 64.0 in | Wt 124.0 lb

## 2014-09-03 DIAGNOSIS — A749 Chlamydial infection, unspecified: Secondary | ICD-10-CM

## 2014-09-03 DIAGNOSIS — Z113 Encounter for screening for infections with a predominantly sexual mode of transmission: Secondary | ICD-10-CM

## 2014-09-03 DIAGNOSIS — A549 Gonococcal infection, unspecified: Secondary | ICD-10-CM

## 2014-09-03 MED ORDER — CEFTRIAXONE SODIUM 1 G IJ SOLR
250.0000 mg | Freq: Once | INTRAMUSCULAR | Status: AC
  Administered 2014-09-03: 250 mg via INTRAMUSCULAR

## 2014-09-03 MED ORDER — AZITHROMYCIN 250 MG PO TABS
1000.0000 mg | ORAL_TABLET | Freq: Once | ORAL | Status: AC
  Administered 2014-09-03: 1000 mg via ORAL

## 2014-09-03 NOTE — Progress Notes (Signed)
Patient here today for STD treatment. BP and pulse very high. Asked patient about history of high blood pressure or health problems or recently taking something. Patient denies all and admits to smoking marijuana recently. Spoke to Nada MaclachlanKaren Teague Clark who recommended urine culture on patient and drug screen. Patient requests blood std testing as well. Patient refused urine sample. Informed patient she can come back in 3 weeks for test of cure with another urine sample and discussed informing partner(s) so they can be treated as well and abstaining for 2 weeks following treatment. Patient verbalized understanding to all

## 2014-09-04 LAB — HIV ANTIBODY (ROUTINE TESTING W REFLEX): HIV 1&2 Ab, 4th Generation: NONREACTIVE

## 2014-09-04 LAB — HEPATITIS C ANTIBODY: HCV AB: NEGATIVE

## 2014-09-04 LAB — HEPATITIS B SURFACE ANTIBODY,QUALITATIVE: HEP B S AB: NEGATIVE

## 2014-09-04 LAB — RPR

## 2014-09-24 ENCOUNTER — Ambulatory Visit: Payer: Self-pay | Admitting: Emergency Medicine

## 2014-11-20 ENCOUNTER — Encounter: Payer: Self-pay | Admitting: *Deleted

## 2015-02-09 ENCOUNTER — Inpatient Hospital Stay (HOSPITAL_COMMUNITY)
Admission: EM | Admit: 2015-02-09 | Discharge: 2015-02-11 | DRG: 690 | Disposition: A | Discharge status: Home / Self Care | Payer: Self-pay | Attending: Internal Medicine | Admitting: Internal Medicine

## 2015-02-09 ENCOUNTER — Encounter (HOSPITAL_COMMUNITY): Payer: Self-pay

## 2015-02-09 DIAGNOSIS — E876 Hypokalemia: Secondary | ICD-10-CM | POA: Diagnosis present

## 2015-02-09 DIAGNOSIS — R109 Unspecified abdominal pain: Secondary | ICD-10-CM | POA: Diagnosis present

## 2015-02-09 DIAGNOSIS — F419 Anxiety disorder, unspecified: Secondary | ICD-10-CM | POA: Diagnosis present

## 2015-02-09 DIAGNOSIS — G43A0 Cyclical vomiting, not intractable: Secondary | ICD-10-CM | POA: Diagnosis present

## 2015-02-09 DIAGNOSIS — N390 Urinary tract infection, site not specified: Principal | ICD-10-CM | POA: Diagnosis present

## 2015-02-09 DIAGNOSIS — K5900 Constipation, unspecified: Secondary | ICD-10-CM

## 2015-02-09 DIAGNOSIS — Z888 Allergy status to other drugs, medicaments and biological substances status: Secondary | ICD-10-CM

## 2015-02-09 DIAGNOSIS — R1115 Cyclical vomiting syndrome unrelated to migraine: Secondary | ICD-10-CM | POA: Diagnosis present

## 2015-02-09 DIAGNOSIS — E440 Moderate protein-calorie malnutrition: Secondary | ICD-10-CM | POA: Insufficient documentation

## 2015-02-09 DIAGNOSIS — Z681 Body mass index (BMI) 19 or less, adult: Secondary | ICD-10-CM

## 2015-02-09 DIAGNOSIS — R112 Nausea with vomiting, unspecified: Secondary | ICD-10-CM | POA: Diagnosis present

## 2015-02-09 DIAGNOSIS — F1210 Cannabis abuse, uncomplicated: Secondary | ICD-10-CM | POA: Diagnosis present

## 2015-02-09 HISTORY — DX: Cyclical vomiting syndrome unrelated to migraine: R11.15

## 2015-02-09 LAB — COMPREHENSIVE METABOLIC PANEL
ALT: 25 U/L (ref 14–54)
AST: 28 U/L (ref 15–41)
Albumin: 5.3 g/dL — ABNORMAL HIGH (ref 3.5–5.0)
Alkaline Phosphatase: 56 U/L (ref 38–126)
Anion gap: 13 (ref 5–15)
BUN: 15 mg/dL (ref 6–20)
CALCIUM: 10 mg/dL (ref 8.9–10.3)
CO2: 26 mmol/L (ref 22–32)
CREATININE: 1.12 mg/dL — AB (ref 0.44–1.00)
Chloride: 98 mmol/L — ABNORMAL LOW (ref 101–111)
GFR calc Af Amer: >60 mL/min (ref >60)
Glucose, Bld: 86 mg/dL (ref 65–99)
Potassium: 2.7 mmol/L — CL (ref 3.5–5.1)
SODIUM: 137 mmol/L (ref 135–145)
TOTAL PROTEIN: 8.8 g/dL — AB (ref 6.5–8.1)
Total Bilirubin: 1.9 mg/dL — ABNORMAL HIGH (ref 0.3–1.2)

## 2015-02-09 LAB — URINALYSIS, ROUTINE W REFLEX MICROSCOPIC
Glucose, UA: NEGATIVE mg/dL
Ketones, ur: >80 mg/dL — AB
Nitrite: NEGATIVE
PROTEIN: 30 mg/dL — AB
Specific Gravity, Urine: 1.035 — ABNORMAL HIGH (ref 1.005–1.030)
Urobilinogen, UA: 1 mg/dL (ref 0.0–1.0)
pH: 6.5 (ref 5.0–8.0)

## 2015-02-09 LAB — CBC WITH DIFFERENTIAL/PLATELET
BASOS PCT: 1 % (ref 0–1)
Basophils Absolute: 0.1 10*3/uL (ref 0.0–0.1)
EOS ABS: 0 10*3/uL (ref 0.0–0.7)
Eosinophils Relative: 0 % (ref 0–5)
HCT: 50.2 % — ABNORMAL HIGH (ref 36.0–46.0)
HEMOGLOBIN: 18 g/dL — AB (ref 12.0–15.0)
Lymphocytes Relative: 22 % (ref 12–46)
Lymphs Abs: 2.4 10*3/uL (ref 0.7–4.0)
MCH: 35.2 pg — ABNORMAL HIGH (ref 26.0–34.0)
MCHC: 35.9 g/dL (ref 30.0–36.0)
MCV: 98.2 fL (ref 78.0–100.0)
MONOS PCT: 6 % (ref 3–12)
Monocytes Absolute: 0.6 10*3/uL (ref 0.1–1.0)
NEUTROS PCT: 71 % (ref 43–77)
Neutro Abs: 7.5 10*3/uL (ref 1.7–7.7)
PLATELETS: 241 10*3/uL (ref 150–400)
RBC: 5.11 MIL/uL (ref 3.87–5.11)
RDW: 11.6 % (ref 11.5–15.5)
WBC: 10.6 10*3/uL — ABNORMAL HIGH (ref 4.0–10.5)

## 2015-02-09 LAB — URINE MICROSCOPIC-ADD ON

## 2015-02-09 LAB — POC URINE PREG, ED: Preg Test, Ur: NEGATIVE

## 2015-02-09 LAB — LIPASE, BLOOD: Lipase: 18 U/L — ABNORMAL LOW (ref 22–51)

## 2015-02-09 MED ORDER — PROMETHAZINE HCL 25 MG/ML IJ SOLN
12.5000 mg | Freq: Once | INTRAMUSCULAR | Status: AC
  Administered 2015-02-09: 12.5 mg via INTRAVENOUS
  Filled 2015-02-09: qty 1

## 2015-02-09 MED ORDER — HYDROMORPHONE HCL 1 MG/ML IJ SOLN
0.5000 mg | Freq: Once | INTRAMUSCULAR | Status: AC
  Administered 2015-02-09: 0.5 mg via INTRAVENOUS
  Filled 2015-02-09: qty 1

## 2015-02-09 MED ORDER — SODIUM CHLORIDE 0.9 % IV BOLUS (SEPSIS)
1000.0000 mL | INTRAVENOUS | Status: AC
  Administered 2015-02-09: 1000 mL via INTRAVENOUS

## 2015-02-09 MED ORDER — POTASSIUM CHLORIDE 10 MEQ/100ML IV SOLN
10.0000 meq | Freq: Once | INTRAVENOUS | Status: AC
  Administered 2015-02-09: 10 meq via INTRAVENOUS
  Filled 2015-02-09: qty 100

## 2015-02-09 NOTE — ED Notes (Addendum)
potassium 2.7; reported by lab; valued reported to Dr. Romeo Apple

## 2015-02-09 NOTE — ED Provider Notes (Signed)
CSN: [id number]     Arrival date & time 02/09/15  2131 History   First MD Initiated Contact with Patient 02/09/15 2140     Chief Complaint  Patient presents with  . Emesis     (Consider location/radiation/quality/duration/timing/severity/associated sxs/prior Treatment) Patient is a 19 y.o. female presenting with vomiting. The history is provided by the patient.  Emesis Severity:  Moderate Duration:  4 days Timing:  Constant Quality:  Stomach contents Able to tolerate:  Liquids Progression:  Unchanged Chronicity:  Chronic Recent urination:  Normal Relieved by:  Nothing Worsened by:  Nothing tried Ineffective treatments:  None tried Associated symptoms: abdominal pain (soreness) and diarrhea   Associated symptoms: no headaches     Past Medical History  Diagnosis Date  . Anxiety    Past Surgical History  Procedure Laterality Date  . Tonsillectomy     No family history on file. History  Substance Use Topics  . Smoking status: Never Smoker   . Smokeless tobacco: Never Used  . Alcohol Use: Yes     Comment: occ   OB History    No data available     Review of Systems  Constitutional: Negative for fever and fatigue.  HENT: Negative for congestion and drooling.   Eyes: Negative for pain.  Respiratory: Negative for cough and shortness of breath.   Cardiovascular: Negative for chest pain.  Gastrointestinal: Positive for nausea, vomiting, abdominal pain (soreness) and diarrhea.  Genitourinary: Positive for dysuria (mild). Negative for hematuria.  Musculoskeletal: Negative for back pain, gait problem and neck pain.  Skin: Negative for color change.  Neurological: Negative for dizziness and headaches.  Hematological: Negative for adenopathy.  Psychiatric/Behavioral: Negative for behavioral problems.  All other systems reviewed and are negative.     Allergies  Other  Home Medications   Prior to Admission medications   Medication Sig Start Date End Date Taking?  Authorizing Provider  azithromycin (ZITHROMAX) 500 MG tablet Take 2 tablets (1,000 mg total) by mouth once. Patient not taking: Reported on 02/09/2015 08/28/14   Catalina Antigua, MD  cephALEXin (KEFLEX) 500 MG capsule Take 1 capsule (500 mg total) by mouth 4 (four) times daily. Patient not taking: Reported on 08/22/2014 07/25/14   Lula Olszewski, MD  diphenhydrAMINE (BENADRYL) 25 MG tablet Take 1 tablet (25 mg total) by mouth every 6 (six) hours. Patient not taking: Reported on 08/22/2014 11/01/13   Mellody Drown, PA-C  metoCLOPramide (REGLAN) 10 MG tablet Take 1 tablet (10 mg total) by mouth 3 (three) times daily as needed for nausea (headache / nausea). Take with benadryl Patient not taking: Reported on 08/22/2014 11/01/13   Mellody Drown, PA-C  ondansetron (ZOFRAN) 4 MG tablet Take 1 tablet (4 mg total) by mouth every 8 (eight) hours as needed for nausea or vomiting. Patient not taking: Reported on 08/22/2014 07/25/14   Lula Olszewski, MD  promethazine (PHENERGAN) 25 MG tablet Take 1 tablet (25 mg total) by mouth every 6 (six) hours as needed for nausea or vomiting. Patient not taking: Reported on 02/09/2015 08/22/14   Oswaldo Conroy, PA-C   BP 150/95 mmHg  Pulse 94  Temp(Src) 99.3 F (37.4 C) (Oral)  Resp 20  SpO2 100%  LMP  (LMP Unknown) Physical Exam  Constitutional: She is oriented to person, place, and time. She appears well-developed and well-nourished.  HENT:  Head: Normocephalic.  Mouth/Throat: Oropharynx is clear and moist. No oropharyngeal exudate.  Eyes: Conjunctivae and EOM are normal. Pupils are equal, round, and reactive to light.  Neck: Normal range of motion. Neck supple.  Cardiovascular: Normal rate, regular rhythm, normal heart sounds and intact distal pulses.  Exam reveals no gallop and no friction rub.   No murmur heard. Pulmonary/Chest: Effort normal and breath sounds normal. No respiratory distress. She has no wheezes.  Abdominal: Soft. Bowel sounds are normal. There is no tenderness.  There is no rebound and no guarding.  Genitourinary:  Normal-appearing external vagina.  Mildly friable appearing cervix. Cervical os is closed. No fluid in the posterior fornix. No CMT or bimanual tenderness.  Musculoskeletal: Normal range of motion. She exhibits no edema or tenderness.  Neurological: She is alert and oriented to person, place, and time.  Skin: Skin is warm and dry.  Psychiatric: She has a normal mood and affect. Her behavior is normal.  Nursing note and vitals reviewed.   ED Course  Procedures (including critical care time) Labs Review Labs Reviewed  CBC WITH DIFFERENTIAL/PLATELET - Abnormal; Notable for the following:    WBC 10.6 (*)    Hemoglobin 18.0 (*)    HCT 50.2 (*)    MCH 35.2 (*)    All other components within normal limits  LIPASE, BLOOD - Abnormal; Notable for the following:    Lipase 18 (*)    All other components within normal limits  COMPREHENSIVE METABOLIC PANEL - Abnormal; Notable for the following:    Potassium 2.7 (*)    Chloride 98 (*)    Creatinine, Ser 1.12 (*)    Total Protein 8.8 (*)    Albumin 5.3 (*)    Total Bilirubin 1.9 (*)    All other components within normal limits  URINALYSIS, ROUTINE W REFLEX MICROSCOPIC (NOT AT [facility]) - Abnormal; Notable for the following:    Color, Urine AMBER (*)    APPearance CLOUDY (*)    Specific Gravity, Urine 1.035 (*)    Hgb urine dipstick SMALL (*)    Bilirubin Urine SMALL (*)    Ketones, ur >80 (*)    Protein, ur 30 (*)    Leukocytes, UA MODERATE (*)    All other components within normal limits  URINE MICROSCOPIC-ADD ON - Abnormal; Notable for the following:    Bacteria, UA MANY (*)    Crystals CA OXALATE CRYSTALS (*)    All other components within normal limits  URINE CULTURE  WET PREP, GENITAL  HIV ANTIBODY (ROUTINE TESTING)  POC URINE PREG, ED  GC/CHLAMYDIA PROBE AMP ([place]) NOT AT [facility]    Imaging Review No results found.   EKG Interpretation None      MDM   Final  diagnoses:  Intractable cyclical vomiting with nausea  Hypokalemia  UTI (lower urinary tract infection)    9:52 PM 19 y.o. female with history of cyclical vomiting who presents with nausea and vomiting. She states she developed some diarrhea and then the nausea and vomiting about 3-4 days ago. Has been intractable since that time. Denies blood in emesis.  She states she was seen at [facility] 2 days ago for similar symptoms and felt better. She states that she was diagnosed with UTI and sent home with nausea medicine. She has not been taking the anti-probiotics due to financial reasons. She denies any fevers. She has some abdominal soreness which she relates to vomiting but denies any frank abdominal pain. Vital signs unremarkable here. We'll get screening labs and symptomatic control.  Given UTI and ongoing nausea and vomiting, will treat with anti-biotics and admit to the hospitalist service. Patient  requested to be checked for STDs. I performed a pelvic exam. She will get Rocephin as well as azithromycin.  Purvis Sheffield, MD 02/10/15 [phone]

## 2015-02-09 NOTE — ED Notes (Signed)
Pt presents with c/o vomiting for the past 4-5 days. Pt reports she has cyclic vomiting syndrome, took some nausea medication earlier today with no relief. Pt denies any diarrhea.

## 2015-02-10 ENCOUNTER — Observation Stay (HOSPITAL_COMMUNITY): Payer: Medicaid Other

## 2015-02-10 ENCOUNTER — Encounter (HOSPITAL_COMMUNITY): Payer: Self-pay | Admitting: Internal Medicine

## 2015-02-10 DIAGNOSIS — R109 Unspecified abdominal pain: Secondary | ICD-10-CM | POA: Diagnosis present

## 2015-02-10 DIAGNOSIS — G43A0 Cyclical vomiting, not intractable: Secondary | ICD-10-CM

## 2015-02-10 DIAGNOSIS — R1084 Generalized abdominal pain: Secondary | ICD-10-CM

## 2015-02-10 DIAGNOSIS — R1115 Cyclical vomiting syndrome unrelated to migraine: Secondary | ICD-10-CM | POA: Diagnosis present

## 2015-02-10 DIAGNOSIS — N390 Urinary tract infection, site not specified: Principal | ICD-10-CM | POA: Diagnosis present

## 2015-02-10 DIAGNOSIS — E876 Hypokalemia: Secondary | ICD-10-CM | POA: Diagnosis present

## 2015-02-10 DIAGNOSIS — E440 Moderate protein-calorie malnutrition: Secondary | ICD-10-CM | POA: Insufficient documentation

## 2015-02-10 LAB — GC/CHLAMYDIA PROBE AMP (~~LOC~~) NOT AT ARMC
CHLAMYDIA, DNA PROBE: NEGATIVE
Neisseria Gonorrhea: NEGATIVE

## 2015-02-10 LAB — RAPID URINE DRUG SCREEN, HOSP PERFORMED
Amphetamines: NOT DETECTED
Barbiturates: NOT DETECTED
Benzodiazepines: NOT DETECTED
Cocaine: NOT DETECTED
Opiates: POSITIVE — AB
Tetrahydrocannabinol: POSITIVE — AB

## 2015-02-10 LAB — CBC
HCT: 42.3 % (ref 36.0–46.0)
HEMOGLOBIN: 14.6 g/dL (ref 12.0–15.0)
MCH: 34.2 pg — AB (ref 26.0–34.0)
MCHC: 34.5 g/dL (ref 30.0–36.0)
MCV: 99.1 fL (ref 78.0–100.0)
Platelets: 218 10*3/uL (ref 150–400)
RBC: 4.27 MIL/uL (ref 3.87–5.11)
RDW: 11.7 % (ref 11.5–15.5)
WBC: 7.5 10*3/uL (ref 4.0–10.5)

## 2015-02-10 LAB — BASIC METABOLIC PANEL
Anion gap: 8 (ref 5–15)
BUN: 10 mg/dL (ref 6–20)
CO2: 25 mmol/L (ref 22–32)
Calcium: 8.6 mg/dL — ABNORMAL LOW (ref 8.9–10.3)
Chloride: 105 mmol/L (ref 101–111)
Creatinine, Ser: 0.93 mg/dL (ref 0.44–1.00)
GFR calc Af Amer: >60 mL/min (ref >60)
GFR calc non Af Amer: >60 mL/min (ref >60)
GLUCOSE: 86 mg/dL (ref 65–99)
Potassium: 3.9 mmol/L (ref 3.5–5.1)
Sodium: 138 mmol/L (ref 135–145)

## 2015-02-10 LAB — WET PREP, GENITAL
Trich, Wet Prep: NONE SEEN
Yeast Wet Prep HPF POC: NONE SEEN

## 2015-02-10 LAB — HIV ANTIBODY (ROUTINE TESTING W REFLEX): HIV Screen 4th Generation wRfx: NONREACTIVE

## 2015-02-10 LAB — MAGNESIUM: MAGNESIUM: 1.7 mg/dL (ref 1.7–2.4)

## 2015-02-10 MED ORDER — POLYETHYLENE GLYCOL 3350 17 G PO PACK
17.0000 g | PACK | Freq: Every day | ORAL | Status: DC | PRN
  Filled 2015-02-10: qty 1

## 2015-02-10 MED ORDER — SODIUM CHLORIDE 0.9 % IV SOLN: INTRAVENOUS | Status: AC

## 2015-02-10 MED ORDER — FENTANYL CITRATE (PF) 100 MCG/2ML IJ SOLN
50.0000 ug | Freq: Once | INTRAMUSCULAR | Status: AC
  Administered 2015-02-10: 50 ug via INTRAVENOUS
  Filled 2015-02-10: qty 2

## 2015-02-10 MED ORDER — OXYCODONE HCL 5 MG PO TABS
5.0000 mg | ORAL_TABLET | ORAL | Status: DC | PRN
  Administered 2015-02-10 – 2015-02-11 (×7): 5 mg via ORAL
  Filled 2015-02-10 (×7): qty 1

## 2015-02-10 MED ORDER — POTASSIUM CHLORIDE 10 MEQ/100ML IV SOLN
10.0000 meq | INTRAVENOUS | Status: AC
  Administered 2015-02-10 (×4): 10 meq via INTRAVENOUS
  Filled 2015-02-10 (×4): qty 100

## 2015-02-10 MED ORDER — ENSURE ENLIVE PO LIQD: 237.0000 mL | Freq: Three times a day (TID) | ORAL | Status: DC | PRN

## 2015-02-10 MED ORDER — HYDROMORPHONE HCL 1 MG/ML IJ SOLN
0.5000 mg | INTRAMUSCULAR | Status: DC | PRN
  Administered 2015-02-10 (×3): 1 mg via INTRAVENOUS
  Administered 2015-02-11: 0.5 mg via INTRAVENOUS
  Filled 2015-02-10 (×4): qty 1

## 2015-02-10 MED ORDER — METOCLOPRAMIDE HCL 5 MG/ML IJ SOLN
10.0000 mg | Freq: Four times a day (QID) | INTRAMUSCULAR | Status: AC
  Administered 2015-02-10 – 2015-02-11 (×6): 10 mg via INTRAVENOUS
  Filled 2015-02-10 (×6): qty 2

## 2015-02-10 MED ORDER — ENOXAPARIN SODIUM 40 MG/0.4ML ~~LOC~~ SOLN
40.0000 mg | SUBCUTANEOUS | Status: DC
  Administered 2015-02-10 – 2015-02-11 (×2): 40 mg via SUBCUTANEOUS
  Filled 2015-02-10 (×2): qty 0.4

## 2015-02-10 MED ORDER — SODIUM CHLORIDE 0.9 % IV BOLUS (SEPSIS)
500.0000 mL | Freq: Once | INTRAVENOUS | Status: AC
  Administered 2015-02-10: 500 mL via INTRAVENOUS

## 2015-02-10 MED ORDER — ACETAMINOPHEN 325 MG PO TABS: 650.0000 mg | ORAL_TABLET | Freq: Four times a day (QID) | ORAL | Status: DC | PRN

## 2015-02-10 MED ORDER — ONDANSETRON HCL 4 MG/2ML IJ SOLN
4.0000 mg | Freq: Four times a day (QID) | INTRAMUSCULAR | Status: DC | PRN
  Administered 2015-02-10 – 2015-02-11 (×6): 4 mg via INTRAVENOUS
  Filled 2015-02-10 (×7): qty 2

## 2015-02-10 MED ORDER — AZITHROMYCIN 250 MG PO TABS
1000.0000 mg | ORAL_TABLET | Freq: Once | ORAL | Status: AC
  Administered 2015-02-10: 1000 mg via ORAL
  Filled 2015-02-10: qty 4

## 2015-02-10 MED ORDER — SODIUM CHLORIDE 0.9 % IJ SOLN: 3.0000 mL | Freq: Two times a day (BID) | INTRAMUSCULAR | Status: DC

## 2015-02-10 MED ORDER — DEXTROSE 5 % IV SOLN
1.0000 g | Freq: Once | INTRAVENOUS | Status: AC
  Administered 2015-02-10: 1 g via INTRAVENOUS
  Filled 2015-02-10: qty 10

## 2015-02-10 MED ORDER — ALUM & MAG HYDROXIDE-SIMETH 200-200-20 MG/5ML PO SUSP: 30.0000 mL | Freq: Four times a day (QID) | ORAL | Status: DC | PRN

## 2015-02-10 MED ORDER — SODIUM CHLORIDE 0.9 % IV SOLN
INTRAVENOUS | Status: DC
  Administered 2015-02-10 – 2015-02-11 (×4): via INTRAVENOUS

## 2015-02-10 MED ORDER — DEXTROSE 5 % IV SOLN
1.0000 g | INTRAVENOUS | Status: DC
  Administered 2015-02-10: 1 g via INTRAVENOUS
  Filled 2015-02-10 (×2): qty 10

## 2015-02-10 MED ORDER — ONDANSETRON HCL 4 MG PO TABS: 4.0000 mg | ORAL_TABLET | Freq: Four times a day (QID) | ORAL | Status: DC | PRN

## 2015-02-10 MED ORDER — ACETAMINOPHEN 650 MG RE SUPP: 650.0000 mg | Freq: Four times a day (QID) | RECTAL | Status: DC | PRN

## 2015-02-10 NOTE — H&P (Addendum)
[facility] Admission History and Physical       Angela Christian ZOX:[id number] DOB: Jun 30, 1996 DOA: 02/09/2015  Referring physician: EDP PCP: Lupe Carney, MD  Specialists:   Chief Complaint: Nausea and Vomiting  HPI: Angela Christian is a 19 y.o. female with a history of Cyclical Vomiting Syndrome who presents to the ED with complaints of increased nausea and vomiting x 4-5 days.   She reports that she has not been abel to hold down foods or liquids.  She had been seen at the [facility] ED and was diagnosed with a UTI 2 days ago , and was given prescriptions for her symptoms and the UTI, but she reports that she was not able to fill the medications due to costs.    She was evaluated and found to have hypokalemia with a potassium level  Of 2.7.      Review of Systems:  Constitutional: No Weight Loss, No Weight Gain, Night Sweats, Fevers, Chills, Dizziness, Light Headedness, Fatigue, or Generalized Weakness HEENT: No Headaches, Difficulty Swallowing,Tooth/Dental Problems,Sore Throat,  No Sneezing, Rhinitis, Ear Ache, Nasal Congestion, or Post Nasal Drip,  Cardio-vascular:  No Chest pain, Orthopnea, PND, Edema in Lower Extremities, Anasarca, Dizziness, Palpitations  Resp: No Dyspnea, No DOE, No Productive Cough, No Non-Productive Cough, No Hemoptysis, No Wheezing.    GI: No Heartburn, Indigestion,+Abdominal Pain, +Nausea,+Vomiting, Diarrhea, Constipation, Hematemesis, Hematochezia, Melena, Change in Bowel Habits,  Loss of Appetite  GU: No Dysuria, No Change in Color of Urine, No Urgency or Urinary Frequency, No Flank pain.  Musculoskeletal: No Joint Pain or Swelling, No Decreased Range of Motion, No Back Pain.  Neurologic: No Syncope, No Seizures, Muscle Weakness, Paresthesia, Vision Disturbance or Loss, No Diplopia, No Vertigo, No Difficulty Walking,  Skin: No Rash or Lesions. Psych: No Change in Mood or Affect, No Depression or Anxiety, No Memory loss, No Confusion,  or Hallucinations   Past Medical History  Diagnosis Date  . Anxiety   . Cyclical vomiting syndrome      Past Surgical History  Procedure Laterality Date  . Tonsillectomy        Prior to Admission medications   Medication Sig Start Date End Date Taking? Authorizing Provider  azithromycin (ZITHROMAX) 500 MG tablet Take 2 tablets (1,000 mg total) by mouth once. Patient not taking: Reported on 02/09/2015 08/28/14   Catalina Antigua, MD  cephALEXin (KEFLEX) 500 MG capsule Take 1 capsule (500 mg total) by mouth 4 (four) times daily. Patient not taking: Reported on 08/22/2014 07/25/14   Lula Olszewski, MD  diphenhydrAMINE (BENADRYL) 25 MG tablet Take 1 tablet (25 mg total) by mouth every 6 (six) hours. Patient not taking: Reported on 08/22/2014 11/01/13   Mellody Drown, PA-C  metoCLOPramide (REGLAN) 10 MG tablet Take 1 tablet (10 mg total) by mouth 3 (three) times daily as needed for nausea (headache / nausea). Take with benadryl Patient not taking: Reported on 08/22/2014 11/01/13   Mellody Drown, PA-C  ondansetron (ZOFRAN) 4 MG tablet Take 1 tablet (4 mg total) by mouth every 8 (eight) hours as needed for nausea or vomiting. Patient not taking: Reported on 08/22/2014 07/25/14   Lula Olszewski, MD  promethazine (PHENERGAN) 25 MG tablet Take 1 tablet (25 mg total) by mouth every 6 (six) hours as needed for nausea or vomiting. Patient not taking: Reported on 02/09/2015 08/22/14   Oswaldo Conroy, PA-C     Allergies  Allergen Reactions  . Other Nausea And Vomiting    Marijuana  Social History:  reports that she has never smoked. She has never used smokeless tobacco. She reports that she drinks alcohol. She reports that she uses illicit drugs (Marijuana).    Family History  Problem Relation Age of Onset  . Hypertension Mother        Physical Exam:  GEN:  Pleasant  Thin  19 y.o. African American female examined and in no acute distress; cooperative with exam Filed Vitals:   02/09/15 2135 02/10/15 0102    BP: 150/95 103/63  Pulse: 94 71  Temp: 99.3 F (37.4 C)   TempSrc: Oral   Resp: 20 18  SpO2: 100% 100%   Blood pressure 103/63, pulse 71, temperature 99.3 F (37.4 C), temperature source Oral, resp. rate 18, SpO2 100 %. PSYCH: She is alert and oriented x4; does not appear anxious does not appear depressed; affect is normal HEENT: Normocephalic and Atraumatic, Mucous membranes pink; PERRLA; EOM intact; Fundi:  Benign;  No scleral icterus, Nares: Patent, Oropharynx: Clear, Fair Dentition,    Neck:  FROM, No Cervical Lymphadenopathy nor Thyromegaly or Carotid Bruit; No JVD; Breasts:: Not examined CHEST WALL: No tenderness CHEST: Normal respiration, clear to auscultation bilaterally HEART: Regular rate and rhythm; no murmurs rubs or gallops BACK: No kyphosis or scoliosis; No CVA tenderness ABDOMEN: Positive Bowel Sounds, Scaphoid, Soft Non-Tender, No Rebound or Guarding; No Masses, No Organomegaly Rectal Exam: Not done EXTREMITIES: No Cyanosis, Clubbing, or Edema; No Ulcerations. Genitalia: not examined PULSES: 2+ and symmetric SKIN: Normal hydration no rash or ulceration CNS:  Alert and Oriented x 4, No Focal Deficits Vascular: pulses palpable throughout    Labs on Admission:  Basic Metabolic Panel:  Recent Labs Lab 02/09/15 2221  NA 137  K 2.7*  CL 98*  CO2 26  GLUCOSE 86  BUN 15  CREATININE 1.12*  CALCIUM 10.0   Liver Function Tests:  Recent Labs Lab 02/09/15 2221  AST 28  ALT 25  ALKPHOS 56  BILITOT 1.9*  PROT 8.8*  ALBUMIN 5.3*    Recent Labs Lab 02/09/15 2221  LIPASE 18*   No results for input(s): AMMONIA in the last 168 hours. CBC:  Recent Labs Lab 02/09/15 2221  WBC 10.6*  NEUTROABS 7.5  HGB 18.0*  HCT 50.2*  MCV 98.2  PLT 241   Cardiac Enzymes: No results for input(s): CKTOTAL, CKMB, CKMBINDEX, TROPONINI in the last 168 hours.  BNP (last 3 results) No results for input(s): BNP in the last 8760 hours.  ProBNP (last 3 results) No  results for input(s): PROBNP in the last 8760 hours.  CBG: No results for input(s): GLUCAP in the last 168 hours.  Radiological Exams on Admission: No results found.      Assessment/Plan:   19 y.o. female with  Principal Problem:   1.   Nausea and vomiting- due to #2 and #4   IV Zofran PRN    Active Problems:   2.  Cyclical vomiting syndrome   PRN IV Zofran   PRN IV Ativan     3.  Abdominal pain   PRN IV Fentanyl     4.  UTI (lower urinary tract infection)   Urine C+S sent   IV Rocephin      5.  Hypokalemia   Replace K+ with IV KCL   Check Magnesium Level     6.  DVT Prophylaxis   Lovenox        7.   Other- Urine sent for GC and CMZ and administered empiric  Azithromycin  PO x 1       Code Status:     FULL CODE   Family Communication:   No Family Present    Disposition Plan:   Observation Status        Time spent: 22 Minutes      Ron Parker [facility] Pager [phone]   If 7AM -7PM Please Contact the Day Rounding Team MD for [facility]  If 7PM-7AM, Please Contact Night-Floor Coverage  [email or web address] Password [email or web address] 02/10/2015, 1:05 AM     ADDENDUM:   Patient was seen and examined on 02/10/2015

## 2015-02-10 NOTE — Progress Notes (Addendum)
Initial Nutrition Assessment  DOCUMENTATION CODES:   Non-severe (moderate) malnutrition in context of acute illness/injury  INTERVENTION:  - Will order Ensure Enlive PRN TID, each supplement provides 350 kcal and 20 grams of protein - RD will continue to monitor for needs   NUTRITION DIAGNOSIS:   Inadequate oral intake related to acute illness as evidenced by per patient/family report, meal completion < 50%.  GOAL:   Patient will meet greater than or equal to 90% of their needs  MONITOR:   PO intake, Supplement acceptance, Weight trends, Labs, I & O's  REASON FOR ASSESSMENT:   Malnutrition Screening Tool  ASSESSMENT:  19 y.o. female with a history of Cyclical Vomiting Syndrome who presents to the ED with complaints of increased nausea and vomiting x 4-5 days.She reports that she has not been abel to hold down foods or liquids. She had been seen at the [facility] ED and was diagnosed with a UTI 2 days ago, and was given prescriptions for her symptoms and the UTI, but she reports that she was not able to fill the medications due to costs.She was evaluated and found to have hypokalemia with a potassium levelof 2.7.  Pt seen for MST. BMI indicates normal weight status. Per weight hx review, pt has lost 11 lbs (9% body weight) in the past 5 months which is significant for time frame. She states that nausea/vomiting began 5 days ago and this AM she has had no vomiting but has had nausea. She has not yet had anything to eat but states MD informed her she can order full liquids and she is planning to order ice cream and Ensure.  She states that last night she attempted to eat a few bites of broccoli and 1-2 bites of chicken and was unable to tolerate this. Pt states that on day of onset she felt nauseous and had diarrhea. After episode of diarrhea she immediately began vomiting. She states that when she has diarrhea she feels overheated which makes her nauseated and causes onset  of vomiting.  Pt is unaware of anything that causes cyclic vomiting to occur, other than feeling warm.  Not meeting needs. No muscle or fat wasting at this time. Medications reviewed. Labs reviewed; Ca: 8.6 mg/dL.  ADDENDUM: 10 mg Reglan every 6 hours order in place.   Diet Order:  Diet full liquid Room service appropriate?: Yes; Fluid consistency:: Thin  Skin:  Reviewed, no issues  Last BM:  PTA  Height:   Ht Readings from Last 1 Encounters:  02/10/15  (1.626 m)    Weight:   Wt Readings from Last 1 Encounters:  02/10/15 113 lb 4.8 oz (51.393 kg)    Ideal Body Weight:  54.54 kg (kg)  Wt Readings from Last 10 Encounters:  02/10/15 113 lb 4.8 oz (51.393 kg)  09/03/14 124 lb (56.246 kg)  10/28/13 107 lb 1 oz (48.563 kg) (11 %*, Z = -1.24)  10/20/13 105 lb (47.628 kg) (8 %*, Z = -1.40)  06/08/13 104 lb (47.174 kg) (7 %*, Z = -1.45)  05/29/13 108 lb (48.988 kg) (13 %*, Z = -1.14)  05/27/13 112 lb 4.8 oz (50.939 kg) (20 %*, Z = -0.84)  05/20/13 108 lb 3.2 oz (49.079 kg) (13 %*, Z = -1.12)  05/19/13 110 lb 1 oz (49.924 kg) (16 %*, Z = -0.99)  01/13/13 108 lb 14.5 oz (49.4 kg) (15 %*, Z = -1.04)   * Growth percentiles are based on CDC 2-20 Years data.  BMI:  Body mass index is 19.44 kg/(m^2).  Estimated Nutritional Needs:   Kcal:  1550-1750  Protein:  55-70 grams  Fluid:  2.5 L/day  EDUCATION NEEDS:   No education needs identified at this time     Trenton Gammon, RD, LDN Inpatient Clinical Dietitian Pager # [phone] After hours/weekend pager # [phone]

## 2015-02-10 NOTE — Progress Notes (Addendum)
[facility] PROGRESS NOTE  Angela Christian YNW:[id number] DOB: 08-Jul-1996 DOA: 02/09/2015 PCP: Lupe Carney, MD  Assessment/Plan: 1. Nausea, abd pain 1. Abd xray personally reviewed, no signs of constipation or obstruction per my own read 2. Urine tox pos for marijuana 3. Consider likely cannabis hyperemesis syndrome vs cyclical vomiting syndrome 4. Advance diet as tolerated 2. Suspected UTI 1. On empiric rocephin 2. Cultures pending 3. Hypokalemia 1. replaced 4. DVT prophylaxis 1. Lovenox subQ 5. Marijuana abuse 1. Marijuana positive 2. Cessation done 6. Moderate protein calorie malnutrition  Code Status: Full Family Communication: Pt in room (indicate person spoken with, relationship, and if by phone, the number) Disposition Plan: Pending   Consultants:    Procedures:    Antibiotics:  Rocephin 7/25>>> (indicate start date, and stop date if known)  HPI/Subjective: Still with abd discomfort and nausea, somewhat improved  Objective: Filed Vitals:   02/10/15 0147 02/10/15 0435 02/10/15 1030 02/10/15 1420  BP: 103/60 130/71 123/64 117/67  Pulse: 76 74 75   Temp: 97.9 F (36.6 C) 98.1 F (36.7 C) 98.7 F (37.1 C) 98.1 F (36.7 C)  TempSrc: Oral Oral Oral Oral  Resp: Height:  (1.626 m)     Weight: 51.393 kg (113 lb 4.8 oz)     SpO2: 100% 100% 100% 100%    Intake/Output Summary (Last 24 hours) at 02/10/15 1509 Last data filed at 02/10/15 1422  Gross per 24 hour  Intake 2211.67 ml  Output    675 ml  Net 1536.67 ml   Filed Weights   02/10/15 0147  Weight: 51.393 kg (113 lb 4.8 oz)    Exam:   General:  Awake, in nad  Cardiovascular: regular, s1, s2  Respiratory: normal resp effort, no wheezing  Abdomen: soft,nondistended  Musculoskeletal: perfused, no clubbing   Data Reviewed: Basic Metabolic Panel:  Recent Labs Lab 02/09/15 2221 02/10/15 0508  NA 137 138  K 2.7* 3.9  CL 98* 105  CO2 26 25  GLUCOSE 86 86   BUN 15 10  CREATININE 1.12* 0.93  CALCIUM 10.0 8.6*  MG  --  1.7   Liver Function Tests:  Recent Labs Lab 02/09/15 2221  AST 28  ALT 25  ALKPHOS 56  BILITOT 1.9*  PROT 8.8*  ALBUMIN 5.3*    Recent Labs Lab 02/09/15 2221  LIPASE 18*   No results for input(s): AMMONIA in the last 168 hours. CBC:  Recent Labs Lab 02/09/15 2221 02/10/15 0508  WBC 10.6* 7.5  NEUTROABS 7.5  --   HGB 18.0* 14.6  HCT 50.2* 42.3  MCV 98.2 99.1  PLT 241 218   Cardiac Enzymes: No results for input(s): CKTOTAL, CKMB, CKMBINDEX, TROPONINI in the last 168 hours. BNP (last 3 results) No results for input(s): BNP in the last 8760 hours.  ProBNP (last 3 results) No results for input(s): PROBNP in the last 8760 hours.  CBG: No results for input(s): GLUCAP in the last 168 hours.  Recent Results (from the past 240 hour(s))  Wet prep, genital     Status: Abnormal   Collection Time: 02/10/15 12:25 AM  Result Value Ref Range Status   Yeast Wet Prep HPF POC NONE SEEN NONE SEEN Final   Trich, Wet Prep NONE SEEN NONE SEEN Final   Clue Cells Wet Prep HPF POC FEW (A) NONE SEEN Final   WBC, Wet Prep HPF POC FEW (A) NONE SEEN Final     Studies: Dg Abd Portable  1v  02/10/2015   CLINICAL DATA:  Acute right lower quadrant abdominal pain, constipation.  EXAM: PORTABLE ABDOMEN - 1 VIEW  COMPARISON:  January 13, 2013.  FINDINGS: The bowel gas pattern is normal. No radio-opaque calculi or other significant radiographic abnormality are seen.  IMPRESSION: No evidence of bowel obstruction or ileus.   Electronically Signed   By: Lupita Raider, M.D.   On: 02/10/2015 11:22    Scheduled Meds: . sodium chloride   Intravenous STAT  . cefTRIAXone (ROCEPHIN)  IV  1 g Intravenous Q24H  . enoxaparin (LOVENOX) injection  40 mg Subcutaneous Q24H  . metoCLOPramide (REGLAN) injection  10 mg Intravenous 4 times per day  . sodium chloride  3 mL Intravenous Q12H   Continuous Infusions: . sodium chloride 100 mL/hr at  02/10/15 1610    Principal Problem:   Nausea and vomiting Active Problems:   UTI (lower urinary tract infection)   Cyclical vomiting syndrome   Abdominal pain   Hypokalemia   Malnutrition of moderate degree   Maxey Ransom K  [facility] Pager [phone]. If 7PM-7AM, please contact night-coverage at [email or web address], password [facility] 02/10/2015, 3:09 PM

## 2015-02-10 NOTE — Progress Notes (Signed)
Pt has had a couple of episodes of dry heaves since admission at 0130 but no emesis.  Continue to c/o of pain of 6/10 in rlq and noted to be sleeping after IV pain meds x2.

## 2015-02-10 NOTE — Care Management Note (Signed)
Case Management Note  Patient Details  Name: Angela Christian MRN: [id number] Date of Birth: 1996-03-18  Subjective/Objective:   19 y/o f admitted w/n/v. From home. Provided w/pcp listing, [facility] med list, community resources-health insurance info. Artist following.                Action/Plan:d/c plan home. No anticipated d/c needs.   Expected Discharge Date:  02/11/15               Expected Discharge Plan:  Home/Self Care  In-House Referral:     Discharge planning Services  CM Consult  Post Acute Care Choice:    Choice offered to:     DME Arranged:    DME Agency:     HH Arranged:    HH Agency:     Status of Service:  In process, will continue to follow  Medicare Important Message Given:    Date Medicare IM Given:    Medicare IM give by:    Date Additional Medicare IM Given:    Additional Medicare Important Message give by:     If discussed at Long Length of Stay Meetings, dates discussed:    Additional Comments:  Lanier Clam, RN 02/10/2015, 3:56 PM

## 2015-02-11 DIAGNOSIS — E440 Moderate protein-calorie malnutrition: Secondary | ICD-10-CM

## 2015-02-11 DIAGNOSIS — E876 Hypokalemia: Secondary | ICD-10-CM

## 2015-02-11 DIAGNOSIS — N946 Dysmenorrhea, unspecified: Secondary | ICD-10-CM

## 2015-02-11 LAB — URINE CULTURE: Special Requests: NORMAL

## 2015-02-11 MED ORDER — METRONIDAZOLE 500 MG PO TABS
500.0000 mg | ORAL_TABLET | Freq: Two times a day (BID) | ORAL | Status: DC
  Administered 2015-02-11: 500 mg via ORAL
  Filled 2015-02-11: qty 1

## 2015-02-11 MED ORDER — METRONIDAZOLE 500 MG PO TABS: 500.0000 mg | ORAL_TABLET | Freq: Two times a day (BID) | ORAL | Status: DC

## 2015-02-11 MED ORDER — KETOROLAC TROMETHAMINE 10 MG PO TABS: 10.0000 mg | ORAL_TABLET | Freq: Four times a day (QID) | ORAL | Status: DC | PRN

## 2015-02-11 MED ORDER — METRONIDAZOLE 500 MG PO TABS: 500.0000 mg | ORAL_TABLET | Freq: Three times a day (TID) | ORAL | Status: DC

## 2015-02-11 MED ORDER — KETOROLAC TROMETHAMINE 15 MG/ML IJ SOLN
15.0000 mg | Freq: Four times a day (QID) | INTRAMUSCULAR | Status: DC | PRN
  Administered 2015-02-11: 15 mg via INTRAVENOUS
  Filled 2015-02-11 (×2): qty 1

## 2015-02-11 MED ORDER — CEFUROXIME AXETIL 250 MG PO TABS: 250.0000 mg | ORAL_TABLET | Freq: Two times a day (BID) | ORAL | Status: DC

## 2015-02-11 MED ORDER — ONDANSETRON HCL 4 MG PO TABS: 4.0000 mg | ORAL_TABLET | Freq: Three times a day (TID) | ORAL | Status: DC | PRN

## 2015-02-11 NOTE — Progress Notes (Signed)
Completed D/C teaching with patient. Answered all questions. Patient is waiting for ride and will be D/C home with her mother once she arrives.  Letitia Neri, RN

## 2015-02-11 NOTE — Discharge Summary (Signed)
Physician Discharge Summary  Angela Christian ZOX:[id number] DOB: 1995/08/05 DOA: 02/09/2015  PCP: Lupe Carney, MD  Admit date: 02/09/2015 Discharge date: 02/11/2015  Time spent: 20 minutes  Recommendations for Outpatient Follow-up:  1. Establish and follow up with PCP 2. Follow up with Dr. Shawnie Pons regarding severe menstrual symptoms  Discharge Diagnoses:  Principal Problem:   Nausea and vomiting Active Problems:   UTI (lower urinary tract infection)   Cyclical vomiting syndrome   Abdominal pain   Hypokalemia   Malnutrition of moderate degree   Discharge Condition: Stable  Diet recommendation: regular  Filed Weights   02/10/15 0147  Weight: 51.393 kg (113 lb 4.8 oz)    History of present illness:  Please see admit h and p from 7/25 for details. Briefly, pt presented with nausea and abd pain. Patient was admitted for suspected possible UTI with cyclical vomiting syndrome.  Hospital Course:  1. Nausea, abd pain 1. Abd xray personally reviewed, no signs of constipation or obstruction per my own read 2. Urine tox pos for marijuana 3. Eventually advanced diet to regular diet 4. Possibly related to marijuana abuse, however on further history, pt reports similar symptoms monthly around time of her periods ever since her Depo shots were discontinued 7 mos ago. Refer to Gyn as outpatient 2. Symptomatic menstrual periods 1. NSAIDS 2. Will refer to Gyn per above 3. Suspected UTI 1. Pt was placed on empiric rocephin 2. Urine cultures remain pending 3. On UA, pos leukocytes. Will complete course with 1 more day of vantin 4. Hypokalemia 1. replaced 5. DVT prophylaxis 1. Lovenox subQ while inpatient 6. Marijuana abuse 1. Marijuana positive 2. Cessation done 7. Moderate protein calorie malnutrition  Discharge Exam: Filed Vitals:   02/10/15 1420 02/10/15 2110 02/11/15 0114 02/11/15 0429  BP: 117/67 113/68 115/67 114/51  Pulse:  95 60 87  Temp: 98.1 F (36.7 C) 97.7 F  (36.5 C) 98 F (36.7 C) 97.9 F (36.6 C)  TempSrc: Oral Oral Oral Oral  Resp:  16 16 16   Height:      Weight:      SpO2: 100% 100% 100% 100%    General: awake, in nad Cardiovascular: regular, s1, s2 Respiratory: normal resp effort, no wheezing  Discharge Instructions     Medication List    STOP taking these medications        azithromycin 500 MG tablet  Commonly known as:  ZITHROMAX     cephALEXin 500 MG capsule  Commonly known as:  KEFLEX     diphenhydrAMINE 25 MG tablet  Commonly known as:  BENADRYL     metoCLOPramide 10 MG tablet  Commonly known as:  REGLAN     promethazine 25 MG tablet  Commonly known as:  PHENERGAN      TAKE these medications        cefUROXime 250 MG tablet  Commonly known as:  CEFTIN  Take 1 tablet (250 mg total) by mouth 2 (two) times daily with a meal.     ketorolac 10 MG tablet  Commonly known as:  TORADOL  Take 1 tablet (10 mg total) by mouth every 6 (six) hours as needed.     metroNIDAZOLE 500 MG tablet  Commonly known as:  FLAGYL  Take 1 tablet (500 mg total) by mouth every 12 (twelve) hours.     ondansetron 4 MG tablet  Commonly known as:  ZOFRAN  Take 1 tablet (4 mg total) by mouth every 8 (eight) hours as needed for  nausea or vomiting.       Allergies  Allergen Reactions  . Other Nausea And Vomiting    Marijuana   Follow-up Information    Follow up with Reva Bores, MD On 02/27/2015.   Specialty:  Obstetrics and Gynecology   Why:  1:45pm, Hospital follow up   Contact information:   [address] [place] [address] [phone]       Follow up with Please establish and follow up with PCP.   Why:  Hospital follow up       The results of significant diagnostics from this hospitalization (including imaging, microbiology, ancillary and laboratory) are listed below for reference.    Significant Diagnostic Studies: Dg Abd Portable 1v  02/10/2015   CLINICAL DATA:  Acute right lower quadrant abdominal  pain, constipation.  EXAM: PORTABLE ABDOMEN - 1 VIEW  COMPARISON:  January 13, 2013.  FINDINGS: The bowel gas pattern is normal. No radio-opaque calculi or other significant radiographic abnormality are seen.  IMPRESSION: No evidence of bowel obstruction or ileus.   Electronically Signed   By: Lupita Raider, M.D.   On: 02/10/2015 11:22    Microbiology: Recent Results (from the past 240 hour(s))  Wet prep, genital     Status: Abnormal   Collection Time: 02/10/15 12:25 AM  Result Value Ref Range Status   Yeast Wet Prep HPF POC NONE SEEN NONE SEEN Final   Trich, Wet Prep NONE SEEN NONE SEEN Final   Clue Cells Wet Prep HPF POC FEW (A) NONE SEEN Final   WBC, Wet Prep HPF POC FEW (A) NONE SEEN Final     Labs: Basic Metabolic Panel:  Recent Labs Lab 02/09/15 2221 02/10/15 0508  NA 137 138  K 2.7* 3.9  CL 98* 105  CO2 26 25  GLUCOSE 86 86  BUN 15 10  CREATININE 1.12* 0.93  CALCIUM 10.0 8.6*  MG  --  1.7   Liver Function Tests:  Recent Labs Lab 02/09/15 2221  AST 28  ALT 25  ALKPHOS 56  BILITOT 1.9*  PROT 8.8*  ALBUMIN 5.3*    Recent Labs Lab 02/09/15 2221  LIPASE 18*   No results for input(s): AMMONIA in the last 168 hours. CBC:  Recent Labs Lab 02/09/15 2221 02/10/15 0508  WBC 10.6* 7.5  NEUTROABS 7.5  --   HGB 18.0* 14.6  HCT 50.2* 42.3  MCV 98.2 99.1  PLT 241 218   Cardiac Enzymes: No results for input(s): CKTOTAL, CKMB, CKMBINDEX, TROPONINI in the last 168 hours. BNP: BNP (last 3 results) No results for input(s): BNP in the last 8760 hours.  ProBNP (last 3 results) No results for input(s): PROBNP in the last 8760 hours.  CBG: No results for input(s): GLUCAP in the last 168 hours.   Signed:  CHIU, STEPHEN K  [facility] 02/11/2015, 11:49 AM

## 2015-02-11 NOTE — Care Management Note (Signed)
Case Management Note  Patient Details  Name: Angela Christian MRN: [id number] Date of Birth: 02-03-1996  Subjective/Objective:   [facility] aware of patient coming to pharmacy for flagyl, ceftin, & zofran under [facility] program. Patient aware of toradol cost, provided w/discount coupon-[facility].Patient has no transp to get there before 5p-CSW notified to asst w/taxi voucher.                Action/Plan:d/c plan home. No further d/c needs.   Expected Discharge Date:                 Expected Discharge Plan:  Home/Self Care  In-House Referral:     Discharge planning Services  CM Consult, [facility] Program, [facility] (Patient does not have $3 co pay-need override)  Post Acute Care Choice:    Choice offered to:     DME Arranged:    DME Agency:     HH Arranged:    HH Agency:     Status of Service:  Completed, signed off  Medicare Important Message Given:    Date Medicare IM Given:    Medicare IM give by:    Date Additional Medicare IM Given:    Additional Medicare Important Message give by:     If discussed at Long Length of Stay Meetings, dates discussed:    Additional Comments:  Lanier Clam, RN 02/11/2015, 3:42 PM

## 2015-02-12 LAB — URINE CULTURE

## 2015-02-27 ENCOUNTER — Ambulatory Visit (INDEPENDENT_AMBULATORY_CARE_PROVIDER_SITE_OTHER): Payer: Self-pay | Admitting: Family Medicine

## 2015-02-27 ENCOUNTER — Encounter: Payer: Self-pay | Admitting: Family Medicine

## 2015-02-27 VITALS — BP 126/73 | HR 69 | Ht 64.0 in | Wt 113.8 lb

## 2015-02-27 DIAGNOSIS — N911 Secondary amenorrhea: Secondary | ICD-10-CM

## 2015-02-27 DIAGNOSIS — R1115 Cyclical vomiting syndrome unrelated to migraine: Secondary | ICD-10-CM

## 2015-02-27 DIAGNOSIS — G43A0 Cyclical vomiting, not intractable: Secondary | ICD-10-CM

## 2015-02-27 MED ORDER — NORGESTIMATE-ETH ESTRADIOL 0.25-35 MG-MCG PO TABS: 1.0000 | ORAL_TABLET | Freq: Every day | ORAL | Status: DC

## 2015-02-27 NOTE — Assessment & Plan Note (Signed)
This is very likely related to her Depo use and suspect cycles will come back normally, but will check her TSH and PRL to be sure.

## 2015-02-27 NOTE — Progress Notes (Signed)
    Subjective:    Patient ID: Angela Christian is a 19 y.o. female presenting with Menstrual Problem  on 02/27/2015  HPI: Patient reports admission related to cyclical vomiting. When given Zofran, that helps. She is wondering if it is related to her cycle. Has been on Depo for 3 years and was not having cycles. She then has noticed that she has lower abdominal pain x 2-3 days. She states the last time she got her cycle, she had similar cyclical vomiting.  Review of Systems  Constitutional: Negative for fever and chills.  Respiratory: Negative for shortness of breath.   Cardiovascular: Negative for chest pain.  Gastrointestinal: Negative for nausea, vomiting and abdominal pain.  Genitourinary: Negative for dysuria.  Skin: Negative for rash.      Objective:    BP 126/73 mmHg  Pulse 69  Ht  (1.626 m)  Wt 113 lb 12.8 oz (51.619 kg)  BMI 19.52 kg/m2  LMP 02/10/2015 (Approximate) Physical Exam  Constitutional: She is oriented to person, place, and time. She appears well-developed and well-nourished. No distress.  HENT:  Head: Normocephalic and atraumatic.  Eyes: No scleral icterus.  Neck: Neck supple.  Cardiovascular: Normal rate.   Pulmonary/Chest: Effort normal.  Abdominal: Soft.  Neurological: She is alert and oriented to person, place, and time.  Skin: Skin is warm and dry.  Psychiatric: She has a normal mood and affect.        Assessment & Plan:   Problem List Items Addressed This Visit      Unprioritized   Cyclical vomiting syndrome    Do not think this is in any way related to her menses.  But can try to alleviate any doubt with OCP's and see.  She is not trying to get pregnant right now. Recommend stopping smoking marijuana completely as well.  Since her LMP is 2 wks ago and she had unprotected intercourse, should await next menses before starting OCP's.      Secondary amenorrhea - Primary    This is very likely related to her Depo use and suspect cycles  will come back normally, but will check her TSH and PRL to be sure.      Relevant Medications   norgestimate-ethinyl estradiol (ORTHO-CYCLEN,SPRINTEC,PREVIFEM) 0.25-35 MG-MCG tablet     Total face-to-face time with patient: 25 minutes. Over 50% of encounter was spent on counseling and coordination of care . Return in about 3 months (around 05/30/2015) for a follow-up.  Torie Towle S 02/27/2015 2:22 PM

## 2015-02-27 NOTE — Addendum Note (Signed)
Addended by: Candelaria Stagers E on: 02/27/2015 04:50 PM   Modules accepted: Orders

## 2015-02-27 NOTE — Progress Notes (Signed)
Pt did have labs drawn, lab tech corrected error.

## 2015-02-27 NOTE — Patient Instructions (Addendum)
Secondary Amenorrhea  Secondary amenorrhea is the stopping of menstrual flow for 3-6 months in a female who has previously had periods. There are many possible causes. Most of these causes are not serious. Usually, treating the underlying problem causing the loss of menses will return your periods to normal. CAUSES  Some common and uncommon causes of not menstruating include:  Malnutrition.  Low blood sugar (hypoglycemia).  Polycystic ovary disease.  Stress or fear.  Breastfeeding.  Hormone imbalance.  Ovarian failure.  Medicines.  Extreme obesity.  Cystic fibrosis.  Low body weight or drastic weight reduction from any cause.  Early menopause.  Removal of ovaries or uterus.  Contraceptives.  Illness.  Long-term (chronic) illnesses.  Cushing syndrome.  Thyroid problems.  Birth control pills, patches, or vaginal rings for birth control. RISK FACTORS You may be at greater risk of secondary amenorrhea if:  You have a family history of this condition.  You have an eating disorder.  You do athletic training. DIAGNOSIS  A diagnosis is made by your health care provider taking a medical history and doing a physical exam. This will include a pelvic exam to check for problems with your reproductive organs. Pregnancy must be ruled out. Often, numerous blood tests are done to measure different hormones in the body. Urine testing may be done. Specialized exams (ultrasound, CT scan, MRI, or hysteroscopy) may have to be done as well as measuring the body mass index (BMI). TREATMENT  Treatment depends on the cause of the amenorrhea. If an eating disorder is present, this can be treated with an adequate diet and therapy. Chronic illnesses may improve with treatment of the illness. Amenorrhea may be corrected with medicines, lifestyle changes, or surgery. If the amenorrhea cannot be corrected, it is sometimes possible to create a false menstruation with medicines. HOME CARE  INSTRUCTIONS  Maintain a healthy diet.  Manage weight problems.  Exercise regularly but not excessively.  Get adequate sleep.  Manage stress.  Be aware of changes in your menstrual cycle. Keep a record of when your periods occur. Note the date your period starts, how long it lasts, and any problems. SEEK MEDICAL CARE IF: Your symptoms do not get better with treatment. Document Released: 08/16/2006 Document Revised: 03/07/2013 Document Reviewed: 12/21/2012 [facility] Patient Information 2015 [place], [place]. This information is not intended to replace advice given to you by your health care provider. Make sure you discuss any questions you have with your health care provider. Dysmenorrhea Menstrual cramps (dysmenorrhea) are caused by the muscles of the uterus tightening (contracting) during a menstrual period. For some women, this discomfort is merely bothersome. For others, dysmenorrhea can be severe enough to interfere with everyday activities for a few days each month. Primary dysmenorrhea is menstrual cramps that last a couple of days when you start having menstrual periods or soon after. This often begins after a teenager starts having her period. As a woman gets older or has a baby, the cramps will usually lessen or disappear. Secondary dysmenorrhea begins later in life, lasts longer, and the pain may be stronger than primary dysmenorrhea. The pain may start before the period and last a few days after the period.  CAUSES  Dysmenorrhea is usually caused by an underlying problem, such as:  The tissue lining the uterus grows outside of the uterus in other areas of the body (endometriosis).  The endometrial tissue, which normally lines the uterus, is found in or grows into the muscular walls of the uterus (adenomyosis).  The pelvic  blood vessels are engorged with blood just before the menstrual period (pelvic congestive syndrome).  Overgrowth of cells (polyps) in the lining of the uterus  or cervix.  Falling down of the uterus (prolapse) because of loose or stretched ligaments.  Depression.  Bladder problems, infection, or inflammation.  Problems with the intestine, a tumor, or irritable bowel syndrome.  Cancer of the female organs or bladder.  A severely tipped uterus.  A very tight opening or closed cervix.  Noncancerous tumors of the uterus (fibroids).  Pelvic inflammatory disease (PID).  Pelvic scarring (adhesions) from a previous surgery.  Ovarian cyst.  An intrauterine device (IUD) used for birth control. RISK FACTORS You may be at greater risk of dysmenorrhea if:  You are younger than age 49.  You started puberty early.  You have irregular or heavy bleeding.  You have never given birth.  You have a family history of this problem.  You are a smoker. SIGNS AND SYMPTOMS   Cramping or throbbing pain in your lower abdomen.  Headaches.  Lower back pain.  Nausea or vomiting.  Diarrhea.  Sweating or dizziness.  Loose stools. DIAGNOSIS  A diagnosis is based on your history, symptoms, physical exam, diagnostic tests, or procedures. Diagnostic tests or procedures may include:  Blood tests.  Ultrasonography.  An examination of the lining of the uterus (dilation and curettage, D&C).  An examination inside your abdomen or pelvis with a scope (laparoscopy).  X-rays.  CT scan.  MRI.  An examination inside the bladder with a scope (cystoscopy).  An examination inside the intestine or stomach with a scope (colonoscopy, gastroscopy). TREATMENT  Treatment depends on the cause of the dysmenorrhea. Treatment may include:  Pain medicine prescribed by your health care provider.  Birth control pills or an IUD with progesterone hormone in it.  Hormone replacement therapy.  Nonsteroidal anti-inflammatory drugs (NSAIDs). These may help stop the production of prostaglandins.  Surgery to remove adhesions, endometriosis, ovarian cyst, or  fibroids.  Removal of the uterus (hysterectomy).  Progesterone shots to stop the menstrual period.  Cutting the nerves on the sacrum that go to the female organs (presacral neurectomy).  Electric current to the sacral nerves (sacral nerve stimulation).  Antidepressant medicine.  Psychiatric therapy, counseling, or group therapy.  Exercise and physical therapy.  Meditation and yoga therapy.  Acupuncture. HOME CARE INSTRUCTIONS   Only take over-the-counter or prescription medicines as directed by your health care provider.  Place a heating pad or hot water bottle on your lower back or abdomen. Do not sleep with the heating pad.  Use aerobic exercises, walking, swimming, biking, and other exercises to help lessen the cramping.  Massage to the lower back or abdomen may help.  Stop smoking.  Avoid alcohol and caffeine. SEEK MEDICAL CARE IF:   Your pain does not get better with medicine.  You have pain with sexual intercourse.  Your pain increases and is not controlled with medicines.  You have abnormal vaginal bleeding with your period.  You develop nausea or vomiting with your period that is not controlled with medicine. SEEK IMMEDIATE MEDICAL CARE IF:  You pass out.  Document Released: 07/05/2005 Document Revised: 03/07/2013 Document Reviewed: 12/21/2012 [facility] Patient Information 2015 [place], [place]. This information is not intended to replace advice given to you by your health care provider. Make sure you discuss any questions you have with your health care provider. Oral Contraception Information Oral contraceptive pills (OCPs) are medicines taken to prevent pregnancy. OCPs work by preventing the  ovaries from releasing eggs. The hormones in OCPs also cause the cervical mucus to thicken, preventing the sperm from entering the uterus. The hormones also cause the uterine lining to become thin, not allowing a fertilized egg to attach to the inside of the uterus. OCPs  are highly effective when taken exactly as prescribed. However, OCPs do not prevent sexually transmitted diseases (STDs). Safe sex practices, such as using condoms along with the pill, can help prevent STDs.  Before taking the pill, you may have a physical exam and Pap test. Your health care provider may order blood tests. The health care provider will make sure you are a good candidate for oral contraception. Discuss with your health care provider the possible side effects of the OCP you may be prescribed. When starting an OCP, it can take 2 to 3 months for the body to adjust to the changes in hormone levels in your body.  TYPES OF ORAL CONTRACEPTION The combination pill--This pill contains estrogen and progestin (synthetic progesterone) hormones. The combination pill comes in 21-day, 28-day, or 91-day packs. Some types of combination pills are meant to be taken continuously (365-day pills). With 21-day packs, you do not take pills for 7 days after the last pill. With 28-day packs, the pill is taken every day. The last 7 pills are without hormones. Certain types of pills have more than 21 hormone-containing pills. With 91-day packs, the first 84 pills contain both hormones, and the last 7 pills contain no hormones or contain estrogen only. The minipill--This pill contains the progesterone hormone only. The pill is taken every day continuously. It is very important to take the pill at the same time each day. The minipill comes in packs of 28 pills. All 28 pills contain the hormone.  ADVANTAGES OF ORAL CONTRACEPTIVE PILLS Decreases premenstrual symptoms.  Treats menstrual period cramps.  Regulates the menstrual cycle.  Decreases a heavy menstrual flow.  May treatacne, depending on the type of pill.  Treats abnormal uterine bleeding.  Treats polycystic ovarian syndrome.  Treats endometriosis.  Can be used as emergency contraception.  THINGS THAT CAN MAKE ORAL CONTRACEPTIVE PILLS LESS  EFFECTIVE OCPs can be less effective if:  You forget to take the pill at the same time every day.  You have a stomach or intestinal disease that lessens the absorption of the pill.  You take OCPs with other medicines that make OCPs less effective, such as antibiotics, certain HIV medicines, and some seizure medicines.  You take expired OCPs.  You forget to restart the pill on day 7, when using the packs of 21 pills.  RISKS ASSOCIATED WITH ORAL CONTRACEPTIVE PILLS  Oral contraceptive pills can sometimes cause side effects, such as: Headache. Nausea. Breast tenderness. Irregular bleeding or spotting. Combination pills are also associated with a small increased risk of: Blood clots. Heart attack. Stroke. Document Released: 09/25/2002 Document Revised: 04/25/2013 Document Reviewed: 12/24/2012 [facility] Patient Information 2015 [place], [place]. This information is not intended to replace advice given to you by your health care provider. Make sure you discuss any questions you have with your health care provider.

## 2015-02-27 NOTE — Progress Notes (Deleted)
Patient ID: Angela Christian, female   DOB: January 03, 1996, 19 y.o.   MRN: [id number] Pt did not come to lab for lab work on 02/27/15.

## 2015-02-27 NOTE — Assessment & Plan Note (Addendum)
Do not think this is in any way related to her menses.  But can try to alleviate any doubt with OCP's and see.  She is not trying to get pregnant right now. Recommend stopping smoking marijuana completely as well.  Since her LMP is 2 wks ago and she had unprotected intercourse, should await next menses before starting OCP's.

## 2015-02-28 LAB — PROLACTIN: PROLACTIN: 3.9 ng/mL

## 2015-02-28 LAB — TSH: TSH: 0.711 u[IU]/mL (ref 0.350–4.500)

## 2015-09-09 ENCOUNTER — Emergency Department (HOSPITAL_COMMUNITY)
Admission: EM | Admit: 2015-09-09 | Discharge: 2015-09-10 | Disposition: A | Discharge status: Other Acute Inpatient Hospital | Payer: Self-pay | Attending: Emergency Medicine | Admitting: Emergency Medicine

## 2015-09-09 ENCOUNTER — Encounter (HOSPITAL_COMMUNITY): Payer: Self-pay

## 2015-09-09 DIAGNOSIS — Y998 Other external cause status: Secondary | ICD-10-CM | POA: Insufficient documentation

## 2015-09-09 DIAGNOSIS — F1210 Cannabis abuse, uncomplicated: Secondary | ICD-10-CM | POA: Insufficient documentation

## 2015-09-09 DIAGNOSIS — Y9389 Activity, other specified: Secondary | ICD-10-CM | POA: Insufficient documentation

## 2015-09-09 DIAGNOSIS — F17200 Nicotine dependence, unspecified, uncomplicated: Secondary | ICD-10-CM | POA: Insufficient documentation

## 2015-09-09 DIAGNOSIS — Z8669 Personal history of other diseases of the nervous system and sense organs: Secondary | ICD-10-CM | POA: Insufficient documentation

## 2015-09-09 DIAGNOSIS — F329 Major depressive disorder, single episode, unspecified: Secondary | ICD-10-CM | POA: Diagnosis present

## 2015-09-09 DIAGNOSIS — Y9289 Other specified places as the place of occurrence of the external cause: Secondary | ICD-10-CM | POA: Insufficient documentation

## 2015-09-09 DIAGNOSIS — Z8659 Personal history of other mental and behavioral disorders: Secondary | ICD-10-CM | POA: Insufficient documentation

## 2015-09-09 DIAGNOSIS — T39012A Poisoning by aspirin, intentional self-harm, initial encounter: Secondary | ICD-10-CM | POA: Insufficient documentation

## 2015-09-09 DIAGNOSIS — Z7982 Long term (current) use of aspirin: Secondary | ICD-10-CM | POA: Insufficient documentation

## 2015-09-09 DIAGNOSIS — Z3202 Encounter for pregnancy test, result negative: Secondary | ICD-10-CM | POA: Insufficient documentation

## 2015-09-09 DIAGNOSIS — F1410 Cocaine abuse, uncomplicated: Secondary | ICD-10-CM | POA: Insufficient documentation

## 2015-09-09 HISTORY — DX: Schizophrenia, unspecified: F20.9

## 2015-09-09 MED ORDER — SODIUM CHLORIDE 0.9 % IV BOLUS (SEPSIS)
1000.0000 mL | Freq: Once | INTRAVENOUS | Status: AC
  Administered 2015-09-10: 1000 mL via INTRAVENOUS

## 2015-09-09 MED ORDER — CHARCOAL ACTIVATED PO LIQD
50.0000 g | Freq: Once | ORAL | Status: AC
  Administered 2015-09-10: 50 g via ORAL
  Filled 2015-09-09: qty 240

## 2015-09-09 NOTE — ED Notes (Signed)
Patient's mother, Tereso Newcomer, was called on behalf of the patient.  Patient requested her presence in ER - [phone].

## 2015-09-09 NOTE — ED Notes (Signed)
Spoke with Onalee Hua at [facility] -  If patient has taken Aspirin recently, within 1-1.5 hours, activated charcoal may be given.   Initiate IV hydration - draw initial labs and repeat ASA level in 2-3 hours after initial draw, monitor patient.
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
## 2015-09-09 NOTE — ED Notes (Signed)
Pt has in her belongings bag a pair of tan boots, a blue [employer] sweatshirt and a pair of black pants. She also has a black cell phone and a black charger a lighter and change in a specimen cup. Pt has been wanded by security.
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
## 2015-09-10 ENCOUNTER — Inpatient Hospital Stay (HOSPITAL_COMMUNITY)
Admission: EM | Admit: 2015-09-10 | Discharge: 2015-09-10 | DRG: 885 | Disposition: A | Discharge status: Home / Self Care | Payer: Medicaid Other | Source: Intra-hospital | Attending: Psychiatry | Admitting: Psychiatry

## 2015-09-10 ENCOUNTER — Encounter (HOSPITAL_COMMUNITY): Payer: Self-pay | Admitting: *Deleted

## 2015-09-10 ENCOUNTER — Encounter (HOSPITAL_COMMUNITY): Payer: Self-pay

## 2015-09-10 DIAGNOSIS — F1910 Other psychoactive substance abuse, uncomplicated: Secondary | ICD-10-CM | POA: Diagnosis present

## 2015-09-10 DIAGNOSIS — F339 Major depressive disorder, recurrent, unspecified: Principal | ICD-10-CM | POA: Diagnosis present

## 2015-09-10 DIAGNOSIS — F39 Unspecified mood [affective] disorder: Secondary | ICD-10-CM | POA: Diagnosis present

## 2015-09-10 DIAGNOSIS — F329 Major depressive disorder, single episode, unspecified: Secondary | ICD-10-CM

## 2015-09-10 LAB — COMPREHENSIVE METABOLIC PANEL
ALK PHOS: 48 U/L (ref 38–126)
ALT: 19 U/L (ref 14–54)
AST: 22 U/L (ref 15–41)
Albumin: 4.2 g/dL (ref 3.5–5.0)
Anion gap: 11 (ref 5–15)
BUN: 9 mg/dL (ref 6–20)
CALCIUM: 9.5 mg/dL (ref 8.9–10.3)
CO2: 23 mmol/L (ref 22–32)
Chloride: 109 mmol/L (ref 101–111)
Creatinine, Ser: 0.95 mg/dL (ref 0.44–1.00)
Glucose, Bld: 81 mg/dL (ref 65–99)
Potassium: 4 mmol/L (ref 3.5–5.1)
Sodium: 143 mmol/L (ref 135–145)
Total Bilirubin: 0.6 mg/dL (ref 0.3–1.2)
Total Protein: 7.1 g/dL (ref 6.5–8.1)

## 2015-09-10 LAB — RAPID URINE DRUG SCREEN, HOSP PERFORMED
Amphetamines: NOT DETECTED
BARBITURATES: NOT DETECTED
Benzodiazepines: NOT DETECTED
COCAINE: POSITIVE — AB
Opiates: NOT DETECTED
TETRAHYDROCANNABINOL: POSITIVE — AB

## 2015-09-10 LAB — CBC WITH DIFFERENTIAL/PLATELET
BASOS PCT: 0 %
Basophils Absolute: 0 10*3/uL (ref 0.0–0.1)
EOS ABS: 0 10*3/uL (ref 0.0–0.7)
EOS PCT: 0 %
HCT: 43.1 % (ref 36.0–46.0)
HEMOGLOBIN: 14.1 g/dL (ref 12.0–15.0)
LYMPHS ABS: 1.7 10*3/uL (ref 0.7–4.0)
Lymphocytes Relative: 25 %
MCH: 34.6 pg — AB (ref 26.0–34.0)
MCHC: 32.7 g/dL (ref 30.0–36.0)
MCV: 105.9 fL — ABNORMAL HIGH (ref 78.0–100.0)
MONOS PCT: 4 %
Monocytes Absolute: 0.3 10*3/uL (ref 0.1–1.0)
NEUTROS PCT: 71 %
Neutro Abs: 4.9 10*3/uL (ref 1.7–7.7)
PLATELETS: 254 10*3/uL (ref 150–400)
RBC: 4.07 MIL/uL (ref 3.87–5.11)
RDW: 12.7 % (ref 11.5–15.5)
WBC: 6.9 10*3/uL (ref 4.0–10.5)

## 2015-09-10 LAB — ACETAMINOPHEN LEVEL

## 2015-09-10 LAB — SALICYLATE LEVEL
SALICYLATE LVL: 11.8 mg/dL (ref 2.8–30.0)
Salicylate Lvl: 12.6 mg/dL (ref 2.8–30.0)

## 2015-09-10 LAB — PREGNANCY, URINE: Preg Test, Ur: NEGATIVE

## 2015-09-10 LAB — ETHANOL: Alcohol, Ethyl (B): 76 mg/dL — ABNORMAL HIGH (ref <5)

## 2015-09-10 MED ORDER — MAGNESIUM HYDROXIDE 400 MG/5ML PO SUSP: 30.0000 mL | Freq: Every day | ORAL | Status: DC | PRN

## 2015-09-10 MED ORDER — ALUM & MAG HYDROXIDE-SIMETH 200-200-20 MG/5ML PO SUSP: 30.0000 mL | ORAL | Status: DC | PRN

## 2015-09-10 MED ORDER — HYDROXYZINE HCL 25 MG PO TABS: 25.0000 mg | ORAL_TABLET | Freq: Four times a day (QID) | ORAL | Status: DC | PRN

## 2015-09-10 MED ORDER — ONDANSETRON 4 MG PO TBDP
4.0000 mg | ORAL_TABLET | Freq: Once | ORAL | Status: AC
  Administered 2015-09-10: 4 mg via ORAL
  Filled 2015-09-10: qty 1

## 2015-09-10 MED ORDER — ACETAMINOPHEN 325 MG PO TABS: 650.0000 mg | ORAL_TABLET | Freq: Four times a day (QID) | ORAL | Status: DC | PRN

## 2015-09-10 NOTE — Clinical Social Work Note (Signed)
CSW spoke with mother Maurice March [phone] who expressed no concerns regarding patient returning home with her today. She does express her desire for patient to engage in outpatient therapy and medication management upon returning home.   Samuella Bruin, LCSW Clinical Social Worker [facility] [phone]

## 2015-09-10 NOTE — ED Notes (Signed)
Patient to [facility] [address] bed 2 after 0800 this am.
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
## 2015-09-10 NOTE — Progress Notes (Signed)
  [facility] Adult Case Management Discharge Plan :  Will you be returning to the same living situation after discharge:  No, patient plans to stay with mother at discharge At discharge, do you have transportation home?: Yes,  patient provided with bus pass Do you have the ability to pay for your medications: Yes,  patient will be provided with prescriptions  Release of information consent forms completed and in the chart;  Patient's signature needed at discharge.  Patient to Follow up at: Follow-up Information    [facility] with [facility].   Specialty:  Professional Counselor   Why:  Walk in clinic for assessment for therapy and medication management services Monday-Friday between 8am to 12pm. Please arrive early in the morning in order to be seen as quickly as possible.    Contact information:   [facility] [place] [address] [place] [address] [phone]       Next level of care provider has access to [facility] Link:no  Safety Planning and Suicide Prevention discussed: Yes,  with patient  Have you used any form of tobacco in the last 30 days? (Cigarettes, Smokeless Tobacco, Cigars, and/or Pipes): Yes  Has patient been referred to the Quitline?: Patient refused referral  Patient has been referred for addiction treatment: N/A  Angela Christian, [place] 09/10/2015, 5:14 PM

## 2015-09-10 NOTE — [facility] Suicide Risk Assessment (Signed)
[facility] INPATIENT:  Family/Significant Other Suicide Prevention Education  Suicide Prevention Education:  Patient Refusal for Family/Significant Other Suicide Prevention Education: The patient Angela Christian has refused to provide written consent for family/significant other to be provided Family/Significant Other Suicide Prevention Education during admission and/or prior to discharge.  Physician notified. SPE reviewed with patient and brochure provided. Patient encouraged to return to hospital if having suicidal thoughts, patient verbalized his/her understanding and has no further questions at this time.   Viridiana Spaid, [place] 09/10/2015, 2:36 PM
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
## 2015-09-10 NOTE — [facility] Counselor (Signed)
No PSA completed as patient discharged prior to 72 hours.  Embree Brawley, LCSW Clinical Social Worker [facility] [phone]  
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
## 2015-09-10 NOTE — Discharge Instructions (Signed)
TO [facility] AFTER 8:00 AM

## 2015-09-10 NOTE — ED Provider Notes (Signed)
Angela Christian, TSS, states patient has been accepted at [facility] by Dr Dub Mikes, can come after 8 am.   Devoria Albe, MD, Concha Pyo, MD 09/10/15 [phone]
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
## 2015-09-10 NOTE — ED Notes (Addendum)
Pt woke up and ambulated to restroom, the writer introduced self to pt and attempted to assess , and give updates. Pt was informed that she has been accepted to [facility]. Pt got extremely upset and started shouting wanting to leave. the writer explained to pt that we have IVC papers and she is not allowed to leave home. Press photographer, security and [employer] off duty officer at bedside.

## 2015-09-10 NOTE — ED Notes (Signed)
Patient is sleeping, breathing even and unlabored.  NAD at this time.
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
## 2015-09-10 NOTE — ED Provider Notes (Signed)
By signing my name below, I, Soijett Blue, attest that this documentation has been prepared under the direction and in the presence of Devoria Albe, MD. Electronically Signed: Soijett Blue, ED Scribe. 09/10/2015. 3:12 AM.   Angela Christian is a 20 y.o. female who presents to the Emergency Department complaining of drug overdose onset PTA. Upon arrival to pt area, pt is on the phone visibly upset and denying that she tried to wrap a cord around her neck while in the waiting room of the ED. She denies any other symptoms.   Physical Exam;  Constitutional: Pt agitated on the phone in the hall., denying that she did anything.   IVC papers signed by me, because pt is threatening to leave and not finishe her evaluation.    Medical screening examination/treatment/procedure(s) were conducted as a shared visit with non-physician practitioner(s) and myself.  I personally evaluated the patient during the encounter.   EKG Interpretation   Date/Time:  Wednesday September 10 2015 00:33:51 EST Ventricular Rate:  75 PR Interval:  142 QRS Duration: 94 QT Interval:  392 QTC Calculation: 437 R Axis:   80 Text Interpretation:  Normal sinus rhythm Right atrial enlargement Early  repolarization Borderline ECG No significant change since last tracing  Confirmed by Ethelda Chick  MD, SAM [phone]) on 09/10/2015 12:47:14 AM       Devoria Albe, MD, FACEP    I personally performed the services described in this documentation, which was scribed in my presence. The recorded information has been reviewed and considered.  Devoria Albe, MD, Concha Pyo, MD 09/10/15 [phone]

## 2015-09-10 NOTE — [facility] Counselor (Signed)
Psych disposition: Per Donell Sievert, PA-C, Pt meets inpt criteria. Per Clint Bolder, AC, Pt accepted to [facility] bed 300-2 by Dr Dub Mikes. Pt can arrive after 08:00 am.  Counselor informed RN that CIWA is still needed and IVC paperwork needs to be faxed to [facility] [phone]) prior to transfer.  Dr Lynelle Doctor informed of disposition also.   Cyndie Mull, [facility]   Therapeutic Triage    Ext [phone]

## 2015-09-10 NOTE — ED Notes (Signed)
Patient c/o nausea, will notify MD
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
## 2015-09-21 ENCOUNTER — Emergency Department (HOSPITAL_COMMUNITY)
Admission: EM | Admit: 2015-09-21 | Discharge: 2015-09-22 | Disposition: A | Discharge status: Home / Self Care | Payer: Medicaid Other | Attending: Emergency Medicine | Admitting: Emergency Medicine

## 2015-09-21 ENCOUNTER — Encounter (HOSPITAL_COMMUNITY): Payer: Self-pay | Admitting: Emergency Medicine

## 2015-09-21 DIAGNOSIS — R1084 Generalized abdominal pain: Secondary | ICD-10-CM

## 2015-09-21 DIAGNOSIS — R1115 Cyclical vomiting syndrome unrelated to migraine: Secondary | ICD-10-CM

## 2015-09-21 DIAGNOSIS — F17200 Nicotine dependence, unspecified, uncomplicated: Secondary | ICD-10-CM | POA: Insufficient documentation

## 2015-09-21 DIAGNOSIS — Z3202 Encounter for pregnancy test, result negative: Secondary | ICD-10-CM | POA: Insufficient documentation

## 2015-09-21 DIAGNOSIS — F1310 Sedative, hypnotic or anxiolytic abuse, uncomplicated: Secondary | ICD-10-CM | POA: Insufficient documentation

## 2015-09-21 DIAGNOSIS — Z7982 Long term (current) use of aspirin: Secondary | ICD-10-CM | POA: Insufficient documentation

## 2015-09-21 DIAGNOSIS — F1210 Cannabis abuse, uncomplicated: Secondary | ICD-10-CM | POA: Insufficient documentation

## 2015-09-21 DIAGNOSIS — F1410 Cocaine abuse, uncomplicated: Secondary | ICD-10-CM | POA: Insufficient documentation

## 2015-09-21 DIAGNOSIS — G43A0 Cyclical vomiting, not intractable: Secondary | ICD-10-CM | POA: Insufficient documentation

## 2015-09-21 DIAGNOSIS — F1910 Other psychoactive substance abuse, uncomplicated: Secondary | ICD-10-CM

## 2015-09-21 NOTE — ED Notes (Signed)
Pt states she has had N/V and generalized abdominal pain x 5 hours. Alert and oriented.

## 2015-09-21 NOTE — ED Notes (Signed)
Bed: [id number] Expected date:  Expected time:  Means of arrival:  Comments: EMS 20 yo N/V/D

## 2015-09-22 LAB — COMPREHENSIVE METABOLIC PANEL
ALK PHOS: 54 U/L (ref 38–126)
ALT: 13 U/L — AB (ref 14–54)
AST: 26 U/L (ref 15–41)
Albumin: 4.4 g/dL (ref 3.5–5.0)
Anion gap: 14 (ref 5–15)
BILIRUBIN TOTAL: 1 mg/dL (ref 0.3–1.2)
BUN: 10 mg/dL (ref 6–20)
CALCIUM: 9.5 mg/dL (ref 8.9–10.3)
CO2: 21 mmol/L — AB (ref 22–32)
CREATININE: 0.99 mg/dL (ref 0.44–1.00)
Chloride: 104 mmol/L (ref 101–111)
GFR calc non Af Amer: >60 mL/min (ref >60)
GLUCOSE: 145 mg/dL — AB (ref 65–99)
Potassium: 3.3 mmol/L — ABNORMAL LOW (ref 3.5–5.1)
SODIUM: 139 mmol/L (ref 135–145)
Total Protein: 7.4 g/dL (ref 6.5–8.1)

## 2015-09-22 LAB — URINALYSIS, ROUTINE W REFLEX MICROSCOPIC
Bilirubin Urine: NEGATIVE
Glucose, UA: NEGATIVE mg/dL
Hgb urine dipstick: NEGATIVE
KETONES UR: 15 mg/dL — AB
Leukocytes, UA: NEGATIVE
Nitrite: NEGATIVE
PROTEIN: NEGATIVE mg/dL
Specific Gravity, Urine: 1.026 (ref 1.005–1.030)
pH: 7.5 (ref 5.0–8.0)

## 2015-09-22 LAB — CBC
HCT: 47.2 % — ABNORMAL HIGH (ref 36.0–46.0)
Hemoglobin: 15.7 g/dL — ABNORMAL HIGH (ref 12.0–15.0)
MCH: 34.6 pg — AB (ref 26.0–34.0)
MCHC: 33.3 g/dL (ref 30.0–36.0)
MCV: 104 fL — ABNORMAL HIGH (ref 78.0–100.0)
PLATELETS: 276 10*3/uL (ref 150–400)
RBC: 4.54 MIL/uL (ref 3.87–5.11)
RDW: 12.2 % (ref 11.5–15.5)
WBC: 17.5 10*3/uL — ABNORMAL HIGH (ref 4.0–10.5)

## 2015-09-22 LAB — RAPID URINE DRUG SCREEN, HOSP PERFORMED
Amphetamines: NOT DETECTED
BENZODIAZEPINES: POSITIVE — AB
Barbiturates: NOT DETECTED
COCAINE: POSITIVE — AB
OPIATES: NOT DETECTED
Tetrahydrocannabinol: POSITIVE — AB

## 2015-09-22 LAB — ACETAMINOPHEN LEVEL

## 2015-09-22 LAB — LIPASE, BLOOD: Lipase: 19 U/L (ref 11–51)

## 2015-09-22 LAB — PREGNANCY, URINE: Preg Test, Ur: NEGATIVE

## 2015-09-22 LAB — SALICYLATE LEVEL

## 2015-09-22 MED ORDER — PROMETHAZINE HCL 25 MG/ML IJ SOLN
12.5000 mg | Freq: Once | INTRAMUSCULAR | Status: AC
  Administered 2015-09-22: 12.5 mg via INTRAVENOUS
  Filled 2015-09-22: qty 1

## 2015-09-22 MED ORDER — ONDANSETRON HCL 4 MG/2ML IJ SOLN
4.0000 mg | Freq: Once | INTRAMUSCULAR | Status: AC | PRN
  Administered 2015-09-22: 4 mg via INTRAVENOUS
  Filled 2015-09-22: qty 2

## 2015-09-22 MED ORDER — PROMETHAZINE HCL 25 MG RE SUPP: 25.0000 mg | Freq: Four times a day (QID) | RECTAL | Status: DC | PRN

## 2015-09-22 MED ORDER — FAMOTIDINE IN NACL 20-0.9 MG/50ML-% IV SOLN
20.0000 mg | Freq: Once | INTRAVENOUS | Status: AC
  Administered 2015-09-22: 20 mg via INTRAVENOUS
  Filled 2015-09-22: qty 50

## 2015-09-22 NOTE — Discharge Instructions (Signed)
Abdominal Pain, Adult Many things can cause abdominal pain. Usually, abdominal pain is not caused by a disease and will improve without treatment. It can often be observed and treated at home. Your health care provider will do a physical exam and possibly order blood tests and X-rays to help determine the seriousness of your pain. However, in many cases, more time must pass before a clear cause of the pain can be found. Before that point, your health care provider may not know if you need more testing or further treatment. HOME CARE INSTRUCTIONS Monitor your abdominal pain for any changes. The following actions may help to alleviate any discomfort you are experiencing:  Only take over-the-counter or prescription medicines as directed by your health care provider.  Do not take laxatives unless directed to do so by your health care provider.  Try a clear liquid diet (broth, tea, or water) as directed by your health care provider. Slowly move to a bland diet as tolerated. SEEK MEDICAL CARE IF:  You have unexplained abdominal pain.  You have abdominal pain associated with nausea or diarrhea.  You have pain when you urinate or have a bowel movement.  You experience abdominal pain that wakes you in the night.  You have abdominal pain that is worsened or improved by eating food.  You have abdominal pain that is worsened with eating fatty foods.  You have a fever. SEEK IMMEDIATE MEDICAL CARE IF:  Your pain does not go away within 2 hours.  You keep throwing up (vomiting).  Your pain is felt only in portions of the abdomen, such as the right side or the left lower portion of the abdomen.  You pass bloody or black tarry stools. MAKE SURE YOU:  Understand these instructions.  Will watch your condition.  Will get help right away if you are not doing well or get worse.   This information is not intended to replace advice given to you by your health care provider. Make sure you discuss  any questions you have with your health care provider.   Document Released: 04/14/2005 Document Revised: 03/26/2015 Document Reviewed: 03/14/2013 Elsevier Interactive Patient Education 2016 [employer]. Polysubstance Abuse When people abuse more than one drug or type of drug it is called polysubstance or polydrug abuse. For example, many smokers also drink alcohol. This is one form of polydrug abuse. Polydrug abuse also refers to the use of a drug to counteract an unpleasant effect produced by another drug. It may also be used to help with withdrawal from another drug. People who take stimulants may become agitated. Sometimes this agitation is countered with a tranquilizer. This helps protect against the unpleasant side effects. Polydrug abuse also refers to the use of different drugs at the same time.  Anytime drug use is interfering with normal living activities, it has become abuse. This includes problems with family and friends. Psychological dependence has developed when your mind tells you that the drug is needed. This is usually followed by physical dependence which has developed when continuing increases of drug are required to get the same feeling or "high". This is known as addiction or chemical dependency. A person's risk is much higher if there is a history of chemical dependency in the family. SIGNS OF CHEMICAL DEPENDENCY  You have been told by friends or family that drugs have become a problem.  You fight when using drugs.  You are having blackouts (not remembering what you do while using).  You feel sick from using  drugs but continue using.  You lie about use or amounts of drugs (chemicals) used.  You need chemicals to get you going.  You are suffering in work performance or in school because of drug use.  You get sick from use of drugs but continue to use anyway.  You need drugs to relate to people or feel comfortable in social situations.  You use drugs to forget  problems. "Yes" answered to any of the above signs of chemical dependency indicates there are problems. The longer the use of drugs continues, the greater the problems will become. If there is a family history of drug or alcohol use, it is best not to experiment with these drugs. Continual use leads to tolerance. After tolerance develops more of the drug is needed to get the same feeling. This is followed by addiction. With addiction, drugs become the most important part of life. It becomes more important to take drugs than participate in the other usual activities of life. This includes relating to friends and family. Addiction is followed by dependency. Dependency is a condition where drugs are now needed not just to get high, but to feel normal. Addiction cannot be cured but it can be stopped. This often requires outside help and the care of professionals. Treatment centers are listed in the yellow pages under: Cocaine, Narcotics, and Alcoholics Anonymous. Most hospitals and clinics can refer you to a specialized care center. Talk to your caregiver if you need help.   This information is not intended to replace advice given to you by your health care provider. Make sure you discuss any questions you have with your health care provider.   Document Released: 02/24/2005 Document Revised: 09/27/2011 Document Reviewed: 07/10/2014 Elsevier Interactive Patient Education 2016 Elsevier Inc. Nausea and Vomiting Nausea is a sick feeling that often comes before throwing up (vomiting). Vomiting is a reflex where stomach contents come out of your mouth. Vomiting can cause severe loss of body fluids (dehydration). Children and elderly adults can become dehydrated quickly, especially if they also have diarrhea. Nausea and vomiting are symptoms of a condition or disease. It is important to find the cause of your symptoms. CAUSES   Direct irritation of the stomach lining. This irritation can result from increased  acid production (gastroesophageal reflux disease), infection, food poisoning, taking certain medicines (such as nonsteroidal anti-inflammatory drugs), alcohol use, or tobacco use.  Signals from the brain.These signals could be caused by a headache, heat exposure, an inner ear disturbance, increased pressure in the brain from injury, infection, a tumor, or a concussion, pain, emotional stimulus, or metabolic problems.  An obstruction in the gastrointestinal tract (bowel obstruction).  Illnesses such as diabetes, hepatitis, gallbladder problems, appendicitis, kidney problems, cancer, sepsis, atypical symptoms of a heart attack, or eating disorders.  Medical treatments such as chemotherapy and radiation.  Receiving medicine that makes you sleep (general anesthetic) during surgery. DIAGNOSIS Your caregiver may ask for tests to be done if the problems do not improve after a few days. Tests may also be done if symptoms are severe or if the reason for the nausea and vomiting is not clear. Tests may include:  Urine tests.  Blood tests.  Stool tests.  Cultures (to look for evidence of infection).  X-rays or other imaging studies. Test results can help your caregiver make decisions about treatment or the need for additional tests. TREATMENT You need to stay well hydrated. Drink frequently but in small amounts.You may wish to drink water, sports drinks, clear  broth, or eat frozen ice pops or gelatin dessert to help stay hydrated.When you eat, eating slowly may help prevent nausea.There are also some antinausea medicines that may help prevent nausea. HOME CARE INSTRUCTIONS   Take all medicine as directed by your caregiver.  If you do not have an appetite, do not force yourself to eat. However, you must continue to drink fluids.  If you have an appetite, eat a normal diet unless your caregiver tells you differently.  Eat a variety of complex carbohydrates (rice, wheat, potatoes, bread), lean  meats, yogurt, fruits, and vegetables.  Avoid high-fat foods because they are more difficult to digest.  Drink enough water and fluids to keep your urine clear or pale yellow.  If you are dehydrated, ask your caregiver for specific rehydration instructions. Signs of dehydration may include:  Severe thirst.  Dry lips and mouth.  Dizziness.  Dark urine.  Decreasing urine frequency and amount.  Confusion.  Rapid breathing or pulse. SEEK IMMEDIATE MEDICAL CARE IF:   You have blood or brown flecks (like coffee grounds) in your vomit.  You have black or bloody stools.  You have a severe headache or stiff neck.  You are confused.  You have severe abdominal pain.  You have chest pain or trouble breathing.  You do not urinate at least once every 8 hours.  You develop cold or clammy skin.  You continue to vomit for longer than 24 to 48 hours.  You have a fever. MAKE SURE YOU:   Understand these instructions.  Will watch your condition.  Will get help right away if you are not doing well or get worse.   This information is not intended to replace advice given to you by your health care provider. Make sure you discuss any questions you have with your health care provider.   Document Released: 07/05/2005 Document Revised: 09/27/2011 Document Reviewed: 12/02/2010 Elsevier Interactive Patient Education [employer]

## 2015-09-22 NOTE — ED Provider Notes (Signed)
CSN: [id number]     Arrival date & time 09/21/15  2354 History   First MD Initiated Contact with Patient 09/22/15 0013     Chief Complaint  Patient presents with  . Emesis  . Abdominal Pain     (Consider location/radiation/quality/duration/timing/severity/associated sxs/prior Treatment) HPI Comments: Patient with a history of schizophrenia, cyclical vomiting syndrome and anxiety presents with abdominal pain and vomiting for the past 5 hours. No fever or diarrhea. She reports symptoms are like previous episodes of cyclic vomiting. No hematemesis. She has not taken anything for symptoms at home.   Patient is a 20 y.o. female presenting with vomiting and abdominal pain. The history is provided by the patient. No language interpreter was used.  Emesis Severity:  Moderate Duration:  5 hours Timing:  Constant Associated symptoms: abdominal pain   Associated symptoms: no chills and no diarrhea   Abdominal Pain Associated symptoms: nausea and vomiting   Associated symptoms: no chills, no diarrhea and no fever     Past Medical History  Diagnosis Date  . Anxiety   . Cyclical vomiting syndrome   . Schizophrenia [facility](HCC)    Past Surgical History  Procedure Laterality Date  . Tonsillectomy     Family History  Problem Relation Age of Onset  . Hypertension Mother    Social History  Substance Use Topics  . Smoking status: Current Every Day Smoker -- 0.25 packs/day  . Smokeless tobacco: Never Used  . Alcohol Use: Yes     Comment: occ   OB History    Gravida Para Term Preterm AB TAB SAB Ectopic Multiple Living   0 0 0 0 0 0 0 0 0 0      Review of Systems  Constitutional: Negative for fever and chills.  Respiratory: Negative.   Cardiovascular: Negative.   Gastrointestinal: Positive for nausea, vomiting and abdominal pain. Negative for diarrhea.  Genitourinary: Negative.   Musculoskeletal: Negative.   Skin: Negative.   Neurological: Negative.  Negative for weakness.       Allergies  Review of patient's allergies indicates no known allergies.  Home Medications   Prior to Admission medications   Medication Sig Start Date End Date Taking? Authorizing Provider  aspirin 325 MG tablet Take 325-650 mg by mouth every 6 (six) hours as needed for mild pain, moderate pain or headache.   Yes Historical Provider, MD  promethazine (PHENERGAN) 25 MG suppository Place 1 suppository (25 mg total) rectally every 6 (six) hours as needed for nausea or vomiting. 09/22/15   Nyala Kirchner, PA-C   BP 137/89 mmHg  Pulse 65  Temp(Src) 97.4 F (36.3 C) (Oral)  Resp 18  SpO2 100%  LMP 09/15/2015 (Within Days) Physical Exam  Constitutional: She is oriented to person, place, and time. She appears well-developed and well-nourished.  Patient is restless, unable to lie still. Does not open her eyes on interview.   HENT:  Head: Normocephalic.  Neck: Normal range of motion.  Cardiovascular: Normal rate.   Pulmonary/Chest: Effort normal. No respiratory distress.  Musculoskeletal: Normal range of motion.  Neurological: She is alert and oriented to person, place, and time.  Skin: Skin is warm and dry.  Psychiatric: She has a normal mood and affect.    ED Course  Procedures (including critical care time) Labs Review Labs Reviewed  COMPREHENSIVE METABOLIC PANEL - Abnormal; Notable for the following:    Potassium 3.3 (*)    CO2 21 (*)    Glucose, Bld 145 (*)    ALT 13 (*)  All other components within normal limits  CBC - Abnormal; Notable for the following:    WBC 17.5 (*)    Hemoglobin 15.7 (*)    HCT 47.2 (*)    MCV 104.0 (*)    MCH 34.6 (*)    All other components within normal limits  URINALYSIS, ROUTINE W REFLEX MICROSCOPIC (NOT AT [facility]) - Abnormal; Notable for the following:    APPearance CLOUDY (*)    Ketones, ur 15 (*)    All other components within normal limits  ACETAMINOPHEN LEVEL - Abnormal; Notable for the following:    Acetaminophen (Tylenol),  Serum <10 (*)    All other components within normal limits  URINE RAPID DRUG SCREEN, HOSP PERFORMED - Abnormal; Notable for the following:    Cocaine POSITIVE (*)    Benzodiazepines POSITIVE (*)    Tetrahydrocannabinol POSITIVE (*)    All other components within normal limits  LIPASE, BLOOD  SALICYLATE LEVEL  PREGNANCY, URINE    Imaging Review No results found. I have personally reviewed and evaluated these images and lab results as part of my medical decision-making.   EKG Interpretation None      MDM   Final diagnoses:  Generalized abdominal pain  Non-intractable cyclical vomiting with nausea  Polysubstance abuse    Patient found sleeping on re-evaluation attempts. NAD. VSS. Labs are essentially unremarkable. UDS positive for cocaine, benzodiazapine, marijuana. Feel the patient is stable for discharge home with PCP follow up, phenergan suppositories.     Elpidio Anis, PA-C 09/22/15 1610  Arby Barrette, MD 09/29/15 [phone]

## 2015-09-22 NOTE — ED Notes (Signed)
Pt provided with ginger ale 

## 2015-09-22 NOTE — ED Notes (Signed)
Patient was alert, oriented and stable upon discharge. RN went over AVS and patient had no further questions.  

## 2015-10-13 ENCOUNTER — Encounter (HOSPITAL_COMMUNITY): Payer: Self-pay | Admitting: *Deleted

## 2015-10-13 DIAGNOSIS — Y9389 Activity, other specified: Secondary | ICD-10-CM | POA: Insufficient documentation

## 2015-10-13 DIAGNOSIS — Y9289 Other specified places as the place of occurrence of the external cause: Secondary | ICD-10-CM | POA: Insufficient documentation

## 2015-10-13 DIAGNOSIS — T7421XA Adult sexual abuse, confirmed, initial encounter: Secondary | ICD-10-CM | POA: Insufficient documentation

## 2015-10-13 DIAGNOSIS — Y998 Other external cause status: Secondary | ICD-10-CM | POA: Insufficient documentation

## 2015-10-13 DIAGNOSIS — F17200 Nicotine dependence, unspecified, uncomplicated: Secondary | ICD-10-CM | POA: Insufficient documentation

## 2015-10-13 DIAGNOSIS — Z202 Contact with and (suspected) exposure to infections with a predominantly sexual mode of transmission: Secondary | ICD-10-CM | POA: Insufficient documentation

## 2015-10-13 NOTE — ED Notes (Signed)
Pt not in sub waiting area
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
## 2015-10-14 ENCOUNTER — Encounter (HOSPITAL_COMMUNITY): Payer: Self-pay | Admitting: Family Medicine

## 2015-10-14 ENCOUNTER — Emergency Department (HOSPITAL_COMMUNITY)
Admission: EM | Admit: 2015-10-14 | Discharge: 2015-10-14 | Disposition: A | Discharge status: Home / Self Care | Payer: Medicaid Other | Attending: Emergency Medicine | Admitting: Emergency Medicine

## 2015-10-14 DIAGNOSIS — Z8659 Personal history of other mental and behavioral disorders: Secondary | ICD-10-CM | POA: Insufficient documentation

## 2015-10-14 DIAGNOSIS — Z113 Encounter for screening for infections with a predominantly sexual mode of transmission: Secondary | ICD-10-CM | POA: Insufficient documentation

## 2015-10-14 DIAGNOSIS — F17200 Nicotine dependence, unspecified, uncomplicated: Secondary | ICD-10-CM | POA: Insufficient documentation

## 2015-10-14 LAB — URINALYSIS, ROUTINE W REFLEX MICROSCOPIC
Bilirubin Urine: NEGATIVE
GLUCOSE, UA: NEGATIVE mg/dL
KETONES UR: NEGATIVE mg/dL
LEUKOCYTES UA: NEGATIVE
NITRITE: NEGATIVE
PH: 6 (ref 5.0–8.0)
Protein, ur: NEGATIVE mg/dL
SPECIFIC GRAVITY, URINE: 1.027 (ref 1.005–1.030)

## 2015-10-14 LAB — WET PREP, GENITAL
CLUE CELLS WET PREP: NONE SEEN
Sperm: NONE SEEN
Trich, Wet Prep: NONE SEEN
Yeast Wet Prep HPF POC: NONE SEEN

## 2015-10-14 LAB — RPR: RPR Ser Ql: NONREACTIVE

## 2015-10-14 LAB — URINE MICROSCOPIC-ADD ON

## 2015-10-14 LAB — HIV ANTIBODY (ROUTINE TESTING W REFLEX): HIV SCREEN 4TH GENERATION: NONREACTIVE

## 2015-10-14 LAB — POC URINE PREG, ED: Preg Test, Ur: NEGATIVE

## 2015-10-14 MED ORDER — CEFTRIAXONE SODIUM 250 MG IJ SOLR
250.0000 mg | Freq: Once | INTRAMUSCULAR | Status: AC
  Administered 2015-10-14: 250 mg via INTRAMUSCULAR
  Filled 2015-10-14: qty 250

## 2015-10-14 MED ORDER — AZITHROMYCIN 250 MG PO TABS
1000.0000 mg | ORAL_TABLET | Freq: Once | ORAL | Status: AC
  Administered 2015-10-14: 1000 mg via ORAL
  Filled 2015-10-14: qty 4

## 2015-10-14 MED ORDER — LIDOCAINE HCL (PF) 1 % IJ SOLN
INTRAMUSCULAR | Status: AC
  Administered 2015-10-14: 1.5 mL
  Filled 2015-10-14: qty 5

## 2015-10-14 NOTE — ED Notes (Signed)
Pt not in room.

## 2015-10-14 NOTE — Discharge Instructions (Signed)
Please read and follow all provided instructions.  Your diagnoses today include:  1. Screening for STD (sexually transmitted disease)    Tests performed today include:  Test for gonorrhea and chlamydia. You will be notified by telephone if you have a positive result.  Vital signs. See below for your results today.   Medications:  You were treated for chlamydia (1 gram azithromycin pills) and gonorrhea (250mg  rocephin shot).  Home care instructions:  Read educational materials contained in this packet and follow any instructions provided.   You should tell your partners about your infection and avoid having sex for one week to allow time for the medicine to work.  Follow-up instructions: You should [facility] to be tested for HIV, syphilis, and hepatitis -- all of which can be transmitted by sexual contact. We do not routinely screen for these in the Emergency Department.  STD Testing:  [facility] [place] [place] [facility]  [address], [place], phone [phone] or [phone]    Monday - Friday, call for an appointment  [facility] [place] [place] [facility]  [address], [place], phone [phone] or [phone]   Monday - Friday, call for an appointment  Return instructions:   Please return to the Emergency Department if you experience worsening symptoms.   Please return if you have any other emergent concerns.  Additional Information:  Your vital signs today were: BP 134/87 mmHg   Pulse 93   Temp(Src) 97.9 F (36.6 C) (Oral)   Resp 18   SpO2 100%   LMP 10/11/2015 If your blood pressure (BP) was elevated above 135/85 this visit, please have this repeated by your doctor within one month. --------------
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
## 2015-10-14 NOTE — ED Notes (Signed)
Pt here for STD check.

## 2015-10-15 LAB — GC/CHLAMYDIA PROBE AMP (~~LOC~~) NOT AT ARMC
Chlamydia: NEGATIVE
Neisseria Gonorrhea: NEGATIVE

## 2016-03-04 DIAGNOSIS — Z7982 Long term (current) use of aspirin: Secondary | ICD-10-CM | POA: Insufficient documentation

## 2016-03-04 DIAGNOSIS — G43909 Migraine, unspecified, not intractable, without status migrainosus: Secondary | ICD-10-CM | POA: Insufficient documentation

## 2016-03-04 DIAGNOSIS — Z5321 Procedure and treatment not carried out due to patient leaving prior to being seen by health care provider: Secondary | ICD-10-CM | POA: Insufficient documentation

## 2016-03-04 DIAGNOSIS — F17200 Nicotine dependence, unspecified, uncomplicated: Secondary | ICD-10-CM | POA: Insufficient documentation

## 2016-03-05 ENCOUNTER — Emergency Department (HOSPITAL_COMMUNITY)
Admission: EM | Admit: 2016-03-05 | Discharge: 2016-03-05 | Disposition: A | Discharge status: Home / Self Care | Payer: Medicaid Other | Attending: Emergency Medicine | Admitting: Emergency Medicine

## 2016-03-05 ENCOUNTER — Encounter (HOSPITAL_COMMUNITY): Payer: Self-pay

## 2016-03-05 LAB — URINE MICROSCOPIC-ADD ON

## 2016-03-05 LAB — CBC
HEMATOCRIT: 43.5 % (ref 36.0–46.0)
HEMOGLOBIN: 14.9 g/dL (ref 12.0–15.0)
MCH: 36 pg — ABNORMAL HIGH (ref 26.0–34.0)
MCHC: 34.3 g/dL (ref 30.0–36.0)
MCV: 105.1 fL — AB (ref 78.0–100.0)
Platelets: 296 10*3/uL (ref 150–400)
RBC: 4.14 MIL/uL (ref 3.87–5.11)
RDW: 11.9 % (ref 11.5–15.5)
WBC: 8.3 10*3/uL (ref 4.0–10.5)

## 2016-03-05 LAB — BASIC METABOLIC PANEL
ANION GAP: 8 (ref 5–15)
BUN: 8 mg/dL (ref 6–20)
CHLORIDE: 106 mmol/L (ref 101–111)
CO2: 24 mmol/L (ref 22–32)
Calcium: 9.6 mg/dL (ref 8.9–10.3)
Creatinine, Ser: 1 mg/dL (ref 0.44–1.00)
GFR calc Af Amer: >60 mL/min (ref >60)
GFR calc non Af Amer: >60 mL/min (ref >60)
GLUCOSE: 75 mg/dL (ref 65–99)
POTASSIUM: 3.9 mmol/L (ref 3.5–5.1)
Sodium: 138 mmol/L (ref 135–145)

## 2016-03-05 LAB — URINALYSIS, ROUTINE W REFLEX MICROSCOPIC
GLUCOSE, UA: NEGATIVE mg/dL
KETONES UR: 15 mg/dL — AB
NITRITE: POSITIVE — AB
PH: 5.5 (ref 5.0–8.0)
Protein, ur: 100 mg/dL — AB
SPECIFIC GRAVITY, URINE: 1.025 (ref 1.005–1.030)

## 2016-03-05 LAB — POC URINE PREG, ED: Preg Test, Ur: NEGATIVE

## 2016-03-05 NOTE — ED Triage Notes (Signed)
Pt states that she has been having nausea and dizziness for the past two days along with migraines. Pt has a hx of migraines and has been getting more frequent. Pt states when she feels dizzy it is a pass out dizzy. Last LMP now.

## 2016-04-06 ENCOUNTER — Emergency Department (HOSPITAL_COMMUNITY)
Admission: EM | Admit: 2016-04-06 | Discharge: 2016-04-06 | Disposition: A | Discharge status: Home / Self Care | Payer: Self-pay | Attending: Emergency Medicine | Admitting: Emergency Medicine

## 2016-04-06 ENCOUNTER — Encounter (HOSPITAL_COMMUNITY): Payer: Self-pay

## 2016-04-06 ENCOUNTER — Emergency Department (HOSPITAL_COMMUNITY): Payer: Self-pay

## 2016-04-06 ENCOUNTER — Emergency Department (HOSPITAL_COMMUNITY)
Admission: EM | Admit: 2016-04-06 | Discharge: 2016-04-06 | Disposition: A | Discharge status: Home / Self Care | Payer: Medicaid Other | Attending: Dermatology | Admitting: Dermatology

## 2016-04-06 DIAGNOSIS — R350 Frequency of micturition: Secondary | ICD-10-CM | POA: Insufficient documentation

## 2016-04-06 DIAGNOSIS — F17200 Nicotine dependence, unspecified, uncomplicated: Secondary | ICD-10-CM | POA: Insufficient documentation

## 2016-04-06 DIAGNOSIS — N390 Urinary tract infection, site not specified: Secondary | ICD-10-CM | POA: Insufficient documentation

## 2016-04-06 DIAGNOSIS — R109 Unspecified abdominal pain: Secondary | ICD-10-CM

## 2016-04-06 DIAGNOSIS — Z5321 Procedure and treatment not carried out due to patient leaving prior to being seen by health care provider: Secondary | ICD-10-CM | POA: Insufficient documentation

## 2016-04-06 DIAGNOSIS — Z7982 Long term (current) use of aspirin: Secondary | ICD-10-CM | POA: Insufficient documentation

## 2016-04-06 LAB — CBC
HEMATOCRIT: 43.6 % (ref 36.0–46.0)
Hemoglobin: 15.1 g/dL — ABNORMAL HIGH (ref 12.0–15.0)
MCH: 35.6 pg — AB (ref 26.0–34.0)
MCHC: 34.6 g/dL (ref 30.0–36.0)
MCV: 102.8 fL — AB (ref 78.0–100.0)
PLATELETS: 263 10*3/uL (ref 150–400)
RBC: 4.24 MIL/uL (ref 3.87–5.11)
RDW: 11.6 % (ref 11.5–15.5)
WBC: 12.2 10*3/uL — ABNORMAL HIGH (ref 4.0–10.5)

## 2016-04-06 LAB — URINE MICROSCOPIC-ADD ON

## 2016-04-06 LAB — COMPREHENSIVE METABOLIC PANEL
ALT: 21 U/L (ref 14–54)
AST: 27 U/L (ref 15–41)
Albumin: 4.8 g/dL (ref 3.5–5.0)
Alkaline Phosphatase: 48 U/L (ref 38–126)
Anion gap: 15 (ref 5–15)
BUN: 8 mg/dL (ref 6–20)
CHLORIDE: 109 mmol/L (ref 101–111)
CO2: 18 mmol/L — AB (ref 22–32)
CREATININE: 0.88 mg/dL (ref 0.44–1.00)
Calcium: 9.8 mg/dL (ref 8.9–10.3)
GFR calc Af Amer: >60 mL/min (ref >60)
GFR calc non Af Amer: >60 mL/min (ref >60)
Glucose, Bld: 106 mg/dL — ABNORMAL HIGH (ref 65–99)
POTASSIUM: 2.8 mmol/L — AB (ref 3.5–5.1)
SODIUM: 142 mmol/L (ref 135–145)
Total Bilirubin: 1.1 mg/dL (ref 0.3–1.2)
Total Protein: 7.9 g/dL (ref 6.5–8.1)

## 2016-04-06 LAB — URINALYSIS, ROUTINE W REFLEX MICROSCOPIC
BILIRUBIN URINE: NEGATIVE
GLUCOSE, UA: NEGATIVE mg/dL
KETONES UR: 15 mg/dL — AB
Nitrite: POSITIVE — AB
PH: 7.5 (ref 5.0–8.0)
Protein, ur: 30 mg/dL — AB
Specific Gravity, Urine: 1.017 (ref 1.005–1.030)

## 2016-04-06 LAB — POC URINE PREG, ED: Preg Test, Ur: NEGATIVE

## 2016-04-06 LAB — LIPASE, BLOOD: LIPASE: 16 U/L (ref 11–51)

## 2016-04-06 MED ORDER — CEPHALEXIN 500 MG PO CAPS: 500.0000 mg | ORAL_CAPSULE | Freq: Four times a day (QID) | ORAL | 0 refills | Status: DC

## 2016-04-06 MED ORDER — KETOROLAC TROMETHAMINE 30 MG/ML IJ SOLN
30.0000 mg | Freq: Once | INTRAMUSCULAR | Status: AC
  Administered 2016-04-06: 30 mg via INTRAVENOUS
  Filled 2016-04-06: qty 1

## 2016-04-06 MED ORDER — MORPHINE SULFATE (PF) 4 MG/ML IV SOLN
4.0000 mg | Freq: Once | INTRAVENOUS | Status: AC
  Administered 2016-04-06: 4 mg via INTRAVENOUS
  Filled 2016-04-06: qty 1

## 2016-04-06 MED ORDER — ONDANSETRON HCL 4 MG/2ML IJ SOLN
4.0000 mg | Freq: Once | INTRAMUSCULAR | Status: AC
  Administered 2016-04-06: 4 mg via INTRAVENOUS
  Filled 2016-04-06: qty 2

## 2016-04-06 MED ORDER — ONDANSETRON 8 MG PO TBDP: ORAL_TABLET | ORAL | 0 refills | Status: DC

## 2016-04-06 MED ORDER — HYDROCODONE-ACETAMINOPHEN 5-325 MG PO TABS: 1.0000 | ORAL_TABLET | Freq: Four times a day (QID) | ORAL | 0 refills | Status: DC | PRN

## 2016-04-06 MED ORDER — SODIUM CHLORIDE 0.9 % IV BOLUS (SEPSIS)
1000.0000 mL | Freq: Once | INTRAVENOUS | Status: AC
  Administered 2016-04-06: 1000 mL via INTRAVENOUS

## 2016-04-06 MED ORDER — MORPHINE SULFATE (PF) 4 MG/ML IV SOLN
4.0000 mg | Freq: Once | INTRAVENOUS | Status: AC
Start: 2016-04-06 — End: 2016-04-06
  Administered 2016-04-06: 4 mg via INTRAVENOUS
  Filled 2016-04-06: qty 1

## 2016-04-06 MED ORDER — DEXTROSE 5 % IV SOLN
1.0000 g | Freq: Once | INTRAVENOUS | Status: AC
  Administered 2016-04-06: 1 g via INTRAVENOUS
  Filled 2016-04-06: qty 10

## 2016-04-06 NOTE — ED Notes (Signed)
Pt sleeping in waiting area, pt refused vitals and stated that she will be going to get her medicine. Pt has no other compalints

## 2016-04-06 NOTE — Discharge Instructions (Signed)
Keflex as prescribed.  Zofran as prescribed as needed for nausea.  Hydrocodone as prescribed as needed for pain.  Return to the emergency department if you develop worsening pain, high fever, or other new and concerning symptoms.

## 2016-04-06 NOTE — ED Notes (Signed)
Pt called to get vitals reassessed. No answer. This Tech was informed that pt left. Pt will be moved OTF. Informed Eric - RN.

## 2016-04-06 NOTE — ED Triage Notes (Signed)
Pt was offered zofran but she refused stating that it would not help.

## 2016-04-06 NOTE — ED Triage Notes (Signed)
Per pT, Pt is coming from home with complaints of UTI where [facility]. Pt states, "I am not going to waste money on antibiotics if they are not going to work. They need to give it to me through the IV."

## 2016-04-06 NOTE — ED Triage Notes (Signed)
Pt states that she has had L sided flank pain since this morning with N/V/D. Pain is worsening. Alert and oriented.

## 2016-04-06 NOTE — ED Provider Notes (Signed)
[facility]-EMERGENCY DEPT Provider Note   CSN: [id number] Arrival date & time: 04/06/16  4098   By signing my name below, I, Christy Sartorius, attest that this documentation has been prepared under the direction and in the presence of Geoffery Lyons, MD . Electronically Signed: Christy Sartorius, Scribe. 04/06/2016. 3:53 AM.  History   Chief Complaint Chief Complaint  Patient presents with  . Emesis  . Abdominal Pain   The history is provided by the patient and medical records. No language interpreter was used.    HPI Comments:  Angela Christian is a 20 y.o. female with a history of cyclical vomiting syndrome and poly substance abuse disorder who presents to the Emergency Department complaining of left sided flank pain which began this morning.  She states she had a slight pain at work, but when she woke up from a nap this afternoon her pain was much worse.  No alleviating factors noted.  She denies history of bladder infection, kidney infection and kidney stones.  She also denies dysuria and states her pain is not worse with urination.       Past Medical History:  Diagnosis Date  . Anxiety   . Cyclical vomiting syndrome   . Schizophrenia [facility])     Patient Active Problem List   Diagnosis Date Noted  . MDD (major depressive disorder) (HCC) 09/10/2015  . Major depressive disorder, recurrent episode (HCC) 09/10/2015  . Polysubstance abuse 09/10/2015  . Unspecified mood (affective) disorder (HCC) 09/10/2015  . Secondary amenorrhea 02/27/2015  . Cyclical vomiting syndrome 02/10/2015    Past Surgical History:  Procedure Laterality Date  . TONSILLECTOMY      OB History    Gravida Para Term Preterm AB Living   0 0 0 0 0 0   SAB TAB Ectopic Multiple Live Births   0 0 0 0         Home Medications    Prior to Admission medications   Medication Sig Start Date End Date Taking? Authorizing Provider  aspirin 325 MG tablet Take 325-650 mg by mouth every 6 (six) hours as needed  for mild pain, moderate pain or headache.    Historical Provider, MD  promethazine (PHENERGAN) 25 MG suppository Place 1 suppository (25 mg total) rectally every 6 (six) hours as needed for nausea or vomiting. 09/22/15   Elpidio Anis, PA-C    Family History Family History  Problem Relation Age of Onset  . Hypertension Mother     Social History Social History  Substance Use Topics  . Smoking status: Current Every Day Smoker    Packs/day: 0.25  . Smokeless tobacco: Never Used  . Alcohol use Yes     Comment: occ     Allergies   Review of patient's allergies indicates no known allergies.   Review of Systems Review of Systems  Gastrointestinal: Positive for abdominal pain and vomiting.  Genitourinary: Positive for dysuria.  All other systems reviewed and are negative.    Physical Exam Updated Vital Signs BP 125/75 (BP Location: Left Arm)   Pulse 68   Temp 97.7 F (36.5 C) (Oral)   Resp 20   LMP 03/30/2016 (Approximate) Comment: negative urine pregnancy test 04/06/16  SpO2 100%   Physical Exam  Constitutional: She is oriented to person, place, and time. She appears well-developed and well-nourished.  HENT:  Head: Normocephalic and atraumatic.  Eyes: Conjunctivae and EOM are normal. No scleral icterus.  Neck: Neck supple. No thyromegaly present.  Cardiovascular: Normal rate and  regular rhythm.  Exam reveals no gallop and no friction rub.   No murmur heard. Pulmonary/Chest: Effort normal. No stridor. She has no wheezes. She has no rales. She exhibits no tenderness.  Abdominal: Soft. Bowel sounds are normal. She exhibits no distension. There is no tenderness. There is no rebound.  Left sided CVA tenderness present.   Musculoskeletal: Normal range of motion. She exhibits no edema.  Lymphadenopathy:    She has no cervical adenopathy.  Neurological: She is alert and oriented to person, place, and time. She exhibits normal muscle tone. Coordination normal.  Skin: No rash  noted. No erythema.  Psychiatric: She has a normal mood and affect. Her behavior is normal.  Nursing note and vitals reviewed.    ED Treatments / Results   DIAGNOSTIC STUDIES:  Oxygen Saturation is 100% on RA, NML by my interpretation.    COORDINATION OF CARE:  3:53 AM Discussed treatment plan with pt at bedside and pt agreed to plan.  Labs (all labs ordered are listed, but only abnormal results are displayed) Labs Reviewed  COMPREHENSIVE METABOLIC PANEL - Abnormal; Notable for the following:       Result Value   Potassium 2.8 (*)    CO2 18 (*)    Glucose, Bld 106 (*)    All other components within normal limits  CBC - Abnormal; Notable for the following:    WBC 12.2 (*)    Hemoglobin 15.1 (*)    MCV 102.8 (*)    MCH 35.6 (*)    All other components within normal limits  URINALYSIS, ROUTINE W REFLEX MICROSCOPIC (NOT AT [facility]) - Abnormal; Notable for the following:    APPearance CLOUDY (*)    Hgb urine dipstick LARGE (*)    Ketones, ur 15 (*)    Protein, ur 30 (*)    Nitrite POSITIVE (*)    Leukocytes, UA LARGE (*)    All other components within normal limits  URINE MICROSCOPIC-ADD ON - Abnormal; Notable for the following:    Squamous Epithelial / LPF 0-5 (*)    Bacteria, UA MANY (*)    All other components within normal limits  LIPASE, BLOOD  POC URINE PREG, ED    EKG  EKG Interpretation None       Radiology No results found.  Procedures Procedures (including critical care time)  Medications Ordered in ED Medications  sodium chloride 0.9 % bolus 1,000 mL (1,000 mLs Intravenous New Bag/Given 04/06/16 0338)  ondansetron (ZOFRAN) injection 4 mg (4 mg Intravenous Given 04/06/16 0335)  morphine 4 MG/ML injection 4 mg (4 mg Intravenous Given 04/06/16 0335)  ketorolac (TORADOL) 30 MG/ML injection 30 mg (30 mg Intravenous Given 04/06/16 0334)     Initial Impression / Assessment and Plan / ED Course  I have reviewed the triage vital signs and the nursing  notes.  Pertinent labs & imaging results that were available during my care of the patient were reviewed by me and considered in my medical decision making (see chart for details).  Clinical Course    Patient with left flank pain. Her urinalysis reveals a UTI, however CT scan fails to reveal a renal calculus. Is feeling better after medications administered in the ER. She has a slight white count, however I see no indication for admission. She will be discharged with antibiotics, nausea medication, and when necessary return.  Final Clinical Impressions(s) / ED Diagnoses   Final diagnoses:  None    New Prescriptions New Prescriptions   No  medications on file   I personally performed the services described in this documentation, which was scribed in my presence. The recorded information has been reviewed and is accurate.        Geoffery Lyons, MD 04/06/16 [phone]

## 2016-04-10 ENCOUNTER — Encounter (HOSPITAL_COMMUNITY): Payer: Self-pay | Admitting: Emergency Medicine

## 2016-04-10 ENCOUNTER — Emergency Department (HOSPITAL_COMMUNITY)
Admission: EM | Admit: 2016-04-10 | Discharge: 2016-04-10 | Disposition: A | Discharge status: Home / Self Care | Payer: Medicaid Other | Attending: Emergency Medicine | Admitting: Emergency Medicine

## 2016-04-10 DIAGNOSIS — F17200 Nicotine dependence, unspecified, uncomplicated: Secondary | ICD-10-CM | POA: Insufficient documentation

## 2016-04-10 DIAGNOSIS — Z79899 Other long term (current) drug therapy: Secondary | ICD-10-CM | POA: Insufficient documentation

## 2016-04-10 DIAGNOSIS — R112 Nausea with vomiting, unspecified: Secondary | ICD-10-CM

## 2016-04-10 DIAGNOSIS — N390 Urinary tract infection, site not specified: Secondary | ICD-10-CM | POA: Insufficient documentation

## 2016-04-10 LAB — URINALYSIS, ROUTINE W REFLEX MICROSCOPIC
BILIRUBIN URINE: NEGATIVE
Glucose, UA: NEGATIVE mg/dL
KETONES UR: 40 mg/dL — AB
NITRITE: NEGATIVE
PH: 8 (ref 5.0–8.0)
Protein, ur: 30 mg/dL — AB
Specific Gravity, Urine: 1.022 (ref 1.005–1.030)

## 2016-04-10 LAB — RAPID URINE DRUG SCREEN, HOSP PERFORMED
Amphetamines: NOT DETECTED
BARBITURATES: NOT DETECTED
Benzodiazepines: NOT DETECTED
COCAINE: NOT DETECTED
Opiates: NOT DETECTED
TETRAHYDROCANNABINOL: POSITIVE — AB

## 2016-04-10 LAB — COMPREHENSIVE METABOLIC PANEL
ALBUMIN: 4.8 g/dL (ref 3.5–5.0)
ALK PHOS: 46 U/L (ref 38–126)
ALT: 22 U/L (ref 14–54)
AST: 23 U/L (ref 15–41)
Anion gap: 9 (ref 5–15)
BILIRUBIN TOTAL: 1.3 mg/dL — AB (ref 0.3–1.2)
BUN: 7 mg/dL (ref 6–20)
CALCIUM: 10.2 mg/dL (ref 8.9–10.3)
CO2: 24 mmol/L (ref 22–32)
Chloride: 109 mmol/L (ref 101–111)
Creatinine, Ser: 0.77 mg/dL (ref 0.44–1.00)
GFR calc Af Amer: >60 mL/min (ref >60)
GFR calc non Af Amer: >60 mL/min (ref >60)
GLUCOSE: 117 mg/dL — AB (ref 65–99)
POTASSIUM: 3.8 mmol/L (ref 3.5–5.1)
Sodium: 142 mmol/L (ref 135–145)
TOTAL PROTEIN: 8 g/dL (ref 6.5–8.1)

## 2016-04-10 LAB — CBC
HEMATOCRIT: 44.6 % (ref 36.0–46.0)
Hemoglobin: 15.5 g/dL — ABNORMAL HIGH (ref 12.0–15.0)
MCH: 35.2 pg — AB (ref 26.0–34.0)
MCHC: 34.8 g/dL (ref 30.0–36.0)
MCV: 101.4 fL — ABNORMAL HIGH (ref 78.0–100.0)
Platelets: 303 10*3/uL (ref 150–400)
RBC: 4.4 MIL/uL (ref 3.87–5.11)
RDW: 11.5 % (ref 11.5–15.5)
WBC: 12.1 10*3/uL — ABNORMAL HIGH (ref 4.0–10.5)

## 2016-04-10 LAB — URINE MICROSCOPIC-ADD ON

## 2016-04-10 LAB — LIPASE, BLOOD: Lipase: 20 U/L (ref 11–51)

## 2016-04-10 MED ORDER — OXYCODONE-ACETAMINOPHEN 5-325 MG PO TABS
1.0000 | ORAL_TABLET | ORAL | Status: AC | PRN
  Administered 2016-04-10 (×2): 1 via ORAL
  Filled 2016-04-10 (×3): qty 1

## 2016-04-10 MED ORDER — SODIUM CHLORIDE 0.9 % IV BOLUS (SEPSIS)
1000.0000 mL | Freq: Once | INTRAVENOUS | Status: AC
  Administered 2016-04-10: 1000 mL via INTRAVENOUS

## 2016-04-10 MED ORDER — PROMETHAZINE HCL 25 MG PO TABS: 25.0000 mg | ORAL_TABLET | Freq: Four times a day (QID) | ORAL | 0 refills | Status: DC | PRN

## 2016-04-10 MED ORDER — ONDANSETRON 4 MG PO TBDP
4.0000 mg | ORAL_TABLET | Freq: Once | ORAL | Status: AC | PRN
  Administered 2016-04-10: 4 mg via ORAL
  Filled 2016-04-10: qty 1

## 2016-04-10 MED ORDER — HALOPERIDOL LACTATE 5 MG/ML IJ SOLN
2.5000 mg | Freq: Once | INTRAMUSCULAR | Status: AC
Start: 2016-04-10 — End: 2016-04-10
  Administered 2016-04-10: 2.5 mg via INTRAVENOUS
  Filled 2016-04-10: qty 1

## 2016-04-10 MED ORDER — SODIUM CHLORIDE 0.9 % IV BOLUS (SEPSIS)
500.0000 mL | Freq: Once | INTRAVENOUS | Status: AC
  Administered 2016-04-10: 500 mL via INTRAVENOUS

## 2016-04-10 MED ORDER — ONDANSETRON HCL 4 MG/2ML IJ SOLN
4.0000 mg | Freq: Once | INTRAMUSCULAR | Status: AC
  Administered 2016-04-10: 4 mg via INTRAVENOUS
  Filled 2016-04-10: qty 2

## 2016-04-10 NOTE — ED Provider Notes (Signed)
[facility]-EMERGENCY DEPT Provider Note   CSN: [id number] Arrival date & time: 04/10/16  1520     History   Chief Complaint Chief Complaint  Patient presents with  . Abdominal Pain  . Emesis    HPI Angela Christian is a 20 y.o. female.  HPI Patient presents with nausea and vomiting. Was seen in the ER 2 days ago and diagnosed with a kidney infection. States she was unable to take the antibiotics yesterday because she was at home. States she's been vomiting unable to keep Anything down. States she's been vomiting stomach contents. No fevers. Denies possibly pregnancy. Denies dysuria. Pain is dull and intermittent abdomen.   Past Medical History:  Diagnosis Date  . Anxiety   . Cyclical vomiting syndrome   . Schizophrenia [facility](HCC)     Patient Active Problem List   Diagnosis Date Noted  . MDD (major depressive disorder) (HCC) 09/10/2015  . Major depressive disorder, recurrent episode (HCC) 09/10/2015  . Polysubstance abuse 09/10/2015  . Unspecified mood (affective) disorder (HCC) 09/10/2015  . Secondary amenorrhea 02/27/2015  . Cyclical vomiting syndrome 02/10/2015    Past Surgical History:  Procedure Laterality Date  . TONSILLECTOMY      OB History    Gravida Para Term Preterm AB Living   0 0 0 0 0 0   SAB TAB Ectopic Multiple Live Births   0 0 0 0         Home Medications    Prior to Admission medications   Medication Sig Start Date End Date Taking? Authorizing Provider  aspirin-acetaminophen-caffeine (EXCEDRIN MIGRAINE) [phone] MG tablet Take 1-2 tablets by mouth every 6 (six) hours as needed for headache.   Yes Historical Provider, MD  cephALEXin (KEFLEX) 500 MG capsule Take 1 capsule (500 mg total) by mouth 4 (four) times daily. 04/06/16  Yes Geoffery Lyonsouglas Delo, MD  HYDROcodone-acetaminophen (NORCO) 5-325 MG tablet Take 1-2 tablets by mouth every 6 (six) hours as needed. 04/06/16  Yes Geoffery Lyonsouglas Delo, MD  ondansetron (ZOFRAN ODT) 8 MG disintegrating tablet 8mg  ODT q4  hours prn nausea 04/06/16   Geoffery Lyonsouglas Delo, MD    Family History Family History  Problem Relation Age of Onset  . Hypertension Mother     Social History Social History  Substance Use Topics  . Smoking status: Current Every Day Smoker    Packs/day: 0.25  . Smokeless tobacco: Never Used  . Alcohol use Yes     Comment: occ     Allergies   Review of patient's allergies indicates no known allergies.   Review of Systems Review of Systems  Constitutional: Positive for appetite change.  HENT: Negative for ear pain.   Respiratory: Negative for shortness of breath.   Cardiovascular: Negative for chest pain.  Gastrointestinal: Positive for abdominal pain, nausea and vomiting.  Genitourinary: Negative for dysuria.  Musculoskeletal: Negative for back pain.  Neurological: Negative for headaches.     Physical Exam Updated Vital Signs BP 139/91 (BP Location: Left Arm)   Pulse 68   Temp 98.2 F (36.8 C) (Oral)   Resp 16   LMP 03/30/2016 (Approximate) Comment: negative urine pregnancy test 04/06/16  SpO2 100%   Physical Exam  Constitutional: She appears well-developed.  HENT:  Head: Atraumatic.  Eyes: EOM are normal.  Neck: Neck supple.  Cardiovascular: Normal rate.   Pulmonary/Chest: Effort normal.  Abdominal: Soft. There is tenderness.  Mild epigastric tenderness without rebound or guarding. Patient is loudly vomiting but I see only spit coming up.  Musculoskeletal: Normal range of motion.  Neurological: She is alert.  Skin: Skin is warm. Capillary refill takes less than 2 seconds.     ED Treatments / Results  Labs (all labs ordered are listed, but only abnormal results are displayed) Labs Reviewed  COMPREHENSIVE METABOLIC PANEL - Abnormal; Notable for the following:       Result Value   Glucose, Bld 117 (*)    Total Bilirubin 1.3 (*)    All other components within normal limits  CBC - Abnormal; Notable for the following:    WBC 12.1 (*)    Hemoglobin 15.5 (*)      MCV 101.4 (*)    MCH 35.2 (*)    All other components within normal limits  LIPASE, BLOOD  URINALYSIS, ROUTINE W REFLEX MICROSCOPIC (NOT AT [facility])  URINE RAPID DRUG SCREEN, HOSP PERFORMED    EKG  EKG Interpretation None       Radiology No results found.  Procedures Procedures (including critical care time)  Medications Ordered in ED Medications  oxyCODONE-acetaminophen (PERCOCET/ROXICET) 5-325 MG per tablet 1 tablet (1 tablet Oral Given 04/10/16 1606)  sodium chloride 0.9 % bolus 500 mL (not administered)  haloperidol lactate (HALDOL) injection 2.5 mg (not administered)  ondansetron (ZOFRAN-ODT) disintegrating tablet 4 mg (4 mg Oral Given 04/10/16 1605)     Initial Impression / Assessment and Plan / ED Course  I have reviewed the triage vital signs and the nursing notes.  Pertinent labs & imaging results that were available during my care of the patient were reviewed by me and considered in my medical decision making (see chart for details).  Clinical Course    Patient with nausea vomiting abdominal pain. Recent treatment for UTI but has not taken her antibiotics. Urine showed possible urinary tract infection. Culture sent now. Has some ketones in urine. Feels better and is tolerating orals. She does smoke marijuana frequently. Hyperemesis also considered. She states she does need to take hot showers and she is feeling bad. Discharge home.  Final Clinical Impressions(s) / ED Diagnoses   Final diagnoses:  None    New Prescriptions New Prescriptions   No medications on file     Benjiman Core, MD 04/10/16 2136

## 2016-04-10 NOTE — ED Notes (Signed)
MD at bedside. 

## 2016-04-10 NOTE — ED Triage Notes (Signed)
Pt presents via EMS from home with c/o abdominal pain, nausea, and vomiting. Pt was diagnosed with a kidney infection on the 19th, and given Keflex but was unable to take it yesterday because she was not home and then when she woke up this morning, she was vomiting and unable to hold anything on her stomach. Pt reports generalized abdominal pain. Pt has a 22g left hand IV started by EMS and 4mg  Zofran given en route.

## 2016-04-10 NOTE — ED Notes (Signed)
No respiratory or acute distress noted alert and oriented x 3 call light in reach no reaction to medication noted states pain in abdomen is coming back.

## 2016-04-10 NOTE — ED Notes (Signed)
No respiratory or acute distress noted alert and oriented x 3 call light in reach no reaction to medication noted. 

## 2016-04-10 NOTE — ED Triage Notes (Signed)
Pt c/o generalized abdominal pain, N/V since yesterday. Pt was diagnosed with a kidney infection earlier this week. Pt has been taking antibiotics but missed her pills yesterday. Pt sts since then she has felt sick. Pt Still c/o pain in back from kidney infection. A&Ox4 and ambulatory.

## 2016-04-12 LAB — URINE CULTURE: CULTURE: NO GROWTH

## 2016-04-13 ENCOUNTER — Encounter (HOSPITAL_COMMUNITY): Payer: Self-pay | Admitting: Emergency Medicine

## 2016-04-13 ENCOUNTER — Emergency Department (HOSPITAL_COMMUNITY)
Admission: EM | Admit: 2016-04-13 | Discharge: 2016-04-13 | Disposition: A | Discharge status: Home / Self Care | Payer: Medicaid Other | Attending: Emergency Medicine | Admitting: Emergency Medicine

## 2016-04-13 DIAGNOSIS — R112 Nausea with vomiting, unspecified: Secondary | ICD-10-CM | POA: Insufficient documentation

## 2016-04-13 DIAGNOSIS — Z7982 Long term (current) use of aspirin: Secondary | ICD-10-CM | POA: Insufficient documentation

## 2016-04-13 DIAGNOSIS — R1084 Generalized abdominal pain: Secondary | ICD-10-CM | POA: Insufficient documentation

## 2016-04-13 DIAGNOSIS — F17200 Nicotine dependence, unspecified, uncomplicated: Secondary | ICD-10-CM | POA: Insufficient documentation

## 2016-04-13 DIAGNOSIS — Z79899 Other long term (current) drug therapy: Secondary | ICD-10-CM | POA: Insufficient documentation

## 2016-04-13 LAB — COMPREHENSIVE METABOLIC PANEL
ALK PHOS: 46 U/L (ref 38–126)
ALT: 20 U/L (ref 14–54)
ANION GAP: 12 (ref 5–15)
AST: 21 U/L (ref 15–41)
Albumin: 4.6 g/dL (ref 3.5–5.0)
BILIRUBIN TOTAL: 1.7 mg/dL — AB (ref 0.3–1.2)
BUN: 8 mg/dL (ref 6–20)
CALCIUM: 10.4 mg/dL — AB (ref 8.9–10.3)
CO2: 28 mmol/L (ref 22–32)
Chloride: 100 mmol/L — ABNORMAL LOW (ref 101–111)
Creatinine, Ser: 1.03 mg/dL — ABNORMAL HIGH (ref 0.44–1.00)
Glucose, Bld: 88 mg/dL (ref 65–99)
POTASSIUM: 3.2 mmol/L — AB (ref 3.5–5.1)
Sodium: 140 mmol/L (ref 135–145)
TOTAL PROTEIN: 7.7 g/dL (ref 6.5–8.1)

## 2016-04-13 LAB — URINALYSIS, ROUTINE W REFLEX MICROSCOPIC
Glucose, UA: NEGATIVE mg/dL
NITRITE: NEGATIVE
PH: 8.5 — AB (ref 5.0–8.0)
Protein, ur: 30 mg/dL — AB
SPECIFIC GRAVITY, URINE: 1.034 — AB (ref 1.005–1.030)

## 2016-04-13 LAB — URINE MICROSCOPIC-ADD ON

## 2016-04-13 LAB — CBC
HEMATOCRIT: 48 % — AB (ref 36.0–46.0)
HEMOGLOBIN: 16.5 g/dL — AB (ref 12.0–15.0)
MCH: 35.4 pg — ABNORMAL HIGH (ref 26.0–34.0)
MCHC: 34.4 g/dL (ref 30.0–36.0)
MCV: 103 fL — ABNORMAL HIGH (ref 78.0–100.0)
Platelets: 306 10*3/uL (ref 150–400)
RBC: 4.66 MIL/uL (ref 3.87–5.11)
RDW: 11.7 % (ref 11.5–15.5)
WBC: 11.2 10*3/uL — AB (ref 4.0–10.5)

## 2016-04-13 LAB — I-STAT BETA HCG BLOOD, ED (MC, WL, AP ONLY): I-stat hCG, quantitative: <5 m[IU]/mL (ref <5)

## 2016-04-13 LAB — LIPASE, BLOOD: Lipase: 17 U/L (ref 11–51)

## 2016-04-13 MED ORDER — ONDANSETRON 4 MG PO TBDP
4.0000 mg | ORAL_TABLET | Freq: Once | ORAL | Status: AC | PRN
  Administered 2016-04-13: 4 mg via ORAL

## 2016-04-13 MED ORDER — SODIUM CHLORIDE 0.9 % IV BOLUS (SEPSIS)
500.0000 mL | Freq: Once | INTRAVENOUS | Status: AC
  Administered 2016-04-13: 500 mL via INTRAVENOUS

## 2016-04-13 MED ORDER — OXYCODONE-ACETAMINOPHEN 5-325 MG PO TABS
1.0000 | ORAL_TABLET | Freq: Once | ORAL | Status: AC
  Administered 2016-04-13: 1 via ORAL
  Filled 2016-04-13: qty 1

## 2016-04-13 MED ORDER — ONDANSETRON 4 MG PO TBDP
ORAL_TABLET | ORAL | Status: AC
  Filled 2016-04-13: qty 1

## 2016-04-13 MED ORDER — POTASSIUM CHLORIDE CRYS ER 20 MEQ PO TBCR
40.0000 meq | EXTENDED_RELEASE_TABLET | Freq: Once | ORAL | Status: AC
Start: 2016-04-13 — End: 2016-04-13
  Administered 2016-04-13: 40 meq via ORAL
  Filled 2016-04-13: qty 2

## 2016-04-13 MED ORDER — KETOROLAC TROMETHAMINE 30 MG/ML IJ SOLN
30.0000 mg | Freq: Once | INTRAMUSCULAR | Status: AC
  Administered 2016-04-13: 30 mg via INTRAVENOUS
  Filled 2016-04-13: qty 1

## 2016-04-13 MED ORDER — PROMETHAZINE HCL 25 MG RE SUPP: 25.0000 mg | Freq: Four times a day (QID) | RECTAL | 0 refills | Status: DC | PRN

## 2016-04-13 MED ORDER — METOCLOPRAMIDE HCL 5 MG/ML IJ SOLN: 10.0000 mg | Freq: Once | INTRAMUSCULAR | Status: DC

## 2016-04-13 MED ORDER — HALOPERIDOL LACTATE 5 MG/ML IJ SOLN
2.5000 mg | Freq: Once | INTRAMUSCULAR | Status: AC
  Administered 2016-04-13: 2.5 mg via INTRAVENOUS
  Filled 2016-04-13: qty 1

## 2016-04-13 NOTE — Discharge Instructions (Signed)
Take Zofran ODT (the one that melts under your tongue) as needed for nausea. You may also try the phenergan suppository that I'm giving you a prescription for today. Please follow up with your primary care provider. Return to the ER for new or worsening symptoms.

## 2016-04-13 NOTE — ED Provider Notes (Signed)
[facility]-EMERGENCY DEPT Provider Note   CSN: [id number] Arrival date & time: 04/13/16  1646     History   Chief Complaint Chief Complaint  Patient presents with  . Emesis   HPI   Angela Christian is an 20 y.o. female who presents to the ED for evaluation of abdominal pain, nausea, and vomiting. She was seen in the ED on 9/19 and diagnosed with a UTI. Sent home with rx for antibiotics and nausea meds. She returned to the ED on 9/23 for persistent nausea and vomiting. She reported then she was unable to keep her antibiotics down. Urine culture from that visit with no growth. Pt returns today for evaluation of persistent bilateral abdominal and flank pain with n/v. States she has PO phenergan at home which helps but the nausea will return. She reports at least 5 episodes of NBNB emesis a day. States she is able to keep some food and water down. Denies diarrhea. She has not tried anything else for her pain. Denies dysuria, urinary frequency/urgency, or hematuria, denies fever or chills. She states she is still smoking marijuana but not as frequently as she used to. Denies other drugs.   Past Medical History:  Diagnosis Date  . Anxiety   . Cyclical vomiting syndrome   . Schizophrenia [facility](HCC)     Patient Active Problem List   Diagnosis Date Noted  . MDD (major depressive disorder) (HCC) 09/10/2015  . Major depressive disorder, recurrent episode (HCC) 09/10/2015  . Polysubstance abuse 09/10/2015  . Unspecified mood (affective) disorder (HCC) 09/10/2015  . Secondary amenorrhea 02/27/2015  . Cyclical vomiting syndrome 02/10/2015    Past Surgical History:  Procedure Laterality Date  . TONSILLECTOMY      OB History    Gravida Para Term Preterm AB Living   0 0 0 0 0 0   SAB TAB Ectopic Multiple Live Births   0 0 0 0         Home Medications    Prior to Admission medications   Medication Sig Start Date End Date Taking? Authorizing Provider  aspirin-acetaminophen-caffeine (EXCEDRIN  MIGRAINE) [phone] MG tablet Take 1-2 tablets by mouth every 6 (six) hours as needed for headache.    Historical Provider, MD  cephALEXin (KEFLEX) 500 MG capsule Take 1 capsule (500 mg total) by mouth 4 (four) times daily. 04/06/16   Geoffery Lyonsouglas Delo, MD  HYDROcodone-acetaminophen (NORCO) 5-325 MG tablet Take 1-2 tablets by mouth every 6 (six) hours as needed. 04/06/16   Geoffery Lyonsouglas Delo, MD  ondansetron (ZOFRAN ODT) 8 MG disintegrating tablet 8mg  ODT q4 hours prn nausea 04/06/16   Geoffery Lyonsouglas Delo, MD  promethazine (PHENERGAN) 25 MG tablet Take 1 tablet (25 mg total) by mouth every 6 (six) hours as needed for nausea or vomiting. 04/10/16   Benjiman CoreNathan Pickering, MD    Family History Family History  Problem Relation Age of Onset  . Hypertension Mother     Social History Social History  Substance Use Topics  . Smoking status: Current Every Day Smoker    Packs/day: 0.50  . Smokeless tobacco: Never Used  . Alcohol use Yes     Comment: occ     Allergies   Review of patient's allergies indicates no known allergies.   Review of Systems Review of Systems 10 Systems reviewed and are negative for acute change except as noted in the HPI.  Physical Exam Updated Vital Signs BP 127/75   Pulse 92   Temp 98.7 F (37.1 C) (Oral)  Resp 18   Ht 5\' 4"  (1.626 m)   Wt 54.4 kg   LMP 03/30/2016 (Approximate) Comment: negative urine pregnancy test 04/06/16  SpO2 100%   BMI 20.60 kg/m   Physical Exam  Constitutional: She is oriented to person, place, and time.  HENT:  Right Ear: External ear normal.  Left Ear: External ear normal.  Nose: Nose normal.  Mouth/Throat: Oropharynx is clear and moist. No oropharyngeal exudate.  Eyes: Conjunctivae are normal.  Neck: Neck supple.  Cardiovascular: Normal rate, regular rhythm, normal heart sounds and intact distal pulses.   Pulmonary/Chest: Effort normal and breath sounds normal. No respiratory distress. She has no wheezes.  Abdominal: Soft. Bowel sounds are  normal. She exhibits no distension. There is no tenderness. There is no rebound and no guarding.  No CVA tenderness  Musculoskeletal: She exhibits no edema.  Lymphadenopathy:    She has no cervical adenopathy.  Neurological: She is alert and oriented to person, place, and time. No cranial nerve deficit.  Skin: Skin is warm and dry.  Psychiatric: She has a normal mood and affect.  Nursing note and vitals reviewed.   ED Treatments / Results  Labs (all labs ordered are listed, but only abnormal results are displayed) Labs Reviewed  COMPREHENSIVE METABOLIC PANEL - Abnormal; Notable for the following:       Result Value   Potassium 3.2 (*)    Chloride 100 (*)    Creatinine, Ser 1.03 (*)    Calcium 10.4 (*)    Total Bilirubin 1.7 (*)    All other components within normal limits  CBC - Abnormal; Notable for the following:    WBC 11.2 (*)    Hemoglobin 16.5 (*)    HCT 48.0 (*)    MCV 103.0 (*)    MCH 35.4 (*)    All other components within normal limits  URINALYSIS, ROUTINE W REFLEX MICROSCOPIC (NOT AT [facility]) - Abnormal; Notable for the following:    Color, Urine AMBER (*)    APPearance CLOUDY (*)    Specific Gravity, Urine 1.034 (*)    pH 8.5 (*)    Hgb urine dipstick SMALL (*)    Bilirubin Urine SMALL (*)    Ketones, ur >80 (*)    Protein, ur 30 (*)    Leukocytes, UA SMALL (*)    All other components within normal limits  URINE MICROSCOPIC-ADD ON - Abnormal; Notable for the following:    Squamous Epithelial / LPF 6-30 (*)    Bacteria, UA MANY (*)    All other components within normal limits  LIPASE, BLOOD  I-STAT BETA HCG BLOOD, ED ([facility], [facility], [facility] ONLY)    EKG  EKG Interpretation None       Radiology No results found.  Procedures Procedures (including critical care time)  Medications Ordered in ED Medications  ondansetron (ZOFRAN-ODT) 4 MG disintegrating tablet (not administered)  oxyCODONE-acetaminophen (PERCOCET/ROXICET) 5-325 MG per tablet 1 tablet (not  administered)  potassium chloride SA (K-DUR,KLOR-CON) CR tablet 40 mEq (not administered)  ondansetron (ZOFRAN-ODT) disintegrating tablet 4 mg (4 mg Oral Given 04/13/16 1720)  sodium chloride 0.9 % bolus 500 mL (0 mLs Intravenous Stopped 04/13/16 2246)  ketorolac (TORADOL) 30 MG/ML injection 30 mg (30 mg Intravenous Given 04/13/16 2125)  haloperidol lactate (HALDOL) injection 2.5 mg (2.5 mg Intravenous Given 04/13/16 2125)     Initial Impression / Assessment and Plan / ED Course  I have reviewed the triage vital signs and the nursing notes.  Pertinent labs &  imaging results that were available during my care of the patient were reviewed by me and considered in my medical decision making (see chart for details).  Clinical Course    Nonfocal abdominal exam. Urine culture from last visit negative and pt's urinary symptoms resolved. She states she thinks she has a couple more pills of antibiotics at home which I encouraged her to continue taking. She states zofran ODT given in triage did seem to help some but she is still nauseated. Will hydrate and treat nausea. Will replete mild hypokalemia of 3.2. Mild AKI. Pt requesting blood pregnancy test which I have ordered. States her periods are irregular. I did counsel her on possibility of cyclical vomiting syndrome. With her recent negative CT renal study and nonfocal abdominal exam and reassuring labs, do not think further imaging warranted tonight.  UA today with evidence of contamination. 6-30 WBC, many bacteria. >80 ketones. Pt hydrated intravenously and orally. Instructed to complete her antibiotics at home and will repeat culture today. Pt pain much improved. She is tolerating PO. Will give rx for phenergan suppository. She also has zofran ODT at home. Encouraged marijuana cessation. Instructed close PCP follow up. ER return precautions given.  Final Clinical Impressions(s) / ED Diagnoses   Final diagnoses:  Generalized abdominal pain    Non-intractable vomiting with nausea, vomiting of unspecified type    New Prescriptions New Prescriptions   PROMETHAZINE (PHENERGAN) 25 MG SUPPOSITORY    Place 1 suppository (25 mg total) rectally every 6 (six) hours as needed for nausea or vomiting.     Carlene Coria, PA-C 04/13/16 2320    Tilden Fossa, MD 04/16/16 2256

## 2016-04-13 NOTE — ED Triage Notes (Signed)
Pt here with abd oain and emesis since Saturday. [facility] Pt sts no relief with phenergan at home. No BM since Sunday.

## 2016-04-14 ENCOUNTER — Emergency Department (HOSPITAL_COMMUNITY)
Admission: EM | Admit: 2016-04-14 | Discharge: 2016-04-14 | Disposition: A | Discharge status: Home / Self Care | Payer: Medicaid Other | Attending: Emergency Medicine | Admitting: Emergency Medicine

## 2016-04-14 ENCOUNTER — Encounter (HOSPITAL_COMMUNITY): Payer: Self-pay | Admitting: Emergency Medicine

## 2016-04-14 DIAGNOSIS — M549 Dorsalgia, unspecified: Secondary | ICD-10-CM | POA: Insufficient documentation

## 2016-04-14 DIAGNOSIS — R112 Nausea with vomiting, unspecified: Secondary | ICD-10-CM

## 2016-04-14 DIAGNOSIS — F17200 Nicotine dependence, unspecified, uncomplicated: Secondary | ICD-10-CM | POA: Insufficient documentation

## 2016-04-14 DIAGNOSIS — F1210 Cannabis abuse, uncomplicated: Secondary | ICD-10-CM

## 2016-04-14 DIAGNOSIS — F1219 Cannabis abuse with unspecified cannabis-induced disorder: Secondary | ICD-10-CM | POA: Insufficient documentation

## 2016-04-14 DIAGNOSIS — R1084 Generalized abdominal pain: Secondary | ICD-10-CM

## 2016-04-14 DIAGNOSIS — Z79899 Other long term (current) drug therapy: Secondary | ICD-10-CM | POA: Insufficient documentation

## 2016-04-14 LAB — CBC WITH DIFFERENTIAL/PLATELET
BASOS ABS: 0 10*3/uL (ref 0.0–0.1)
BASOS PCT: 0 %
EOS ABS: 0 10*3/uL (ref 0.0–0.7)
EOS PCT: 0 %
HCT: 47.4 % — ABNORMAL HIGH (ref 36.0–46.0)
Hemoglobin: 16.3 g/dL — ABNORMAL HIGH (ref 12.0–15.0)
LYMPHS PCT: 12 %
Lymphs Abs: 1.2 10*3/uL (ref 0.7–4.0)
MCH: 35.4 pg — ABNORMAL HIGH (ref 26.0–34.0)
MCHC: 34.4 g/dL (ref 30.0–36.0)
MCV: 102.8 fL — ABNORMAL HIGH (ref 78.0–100.0)
MONO ABS: 0.3 10*3/uL (ref 0.1–1.0)
Monocytes Relative: 3 %
Neutro Abs: 8.6 10*3/uL — ABNORMAL HIGH (ref 1.7–7.7)
Neutrophils Relative %: 85 %
PLATELETS: 288 10*3/uL (ref 150–400)
RBC: 4.61 MIL/uL (ref 3.87–5.11)
RDW: 11.7 % (ref 11.5–15.5)
WBC: 10.2 10*3/uL (ref 4.0–10.5)

## 2016-04-14 LAB — COMPREHENSIVE METABOLIC PANEL
ALBUMIN: 5.2 g/dL — AB (ref 3.5–5.0)
ALK PHOS: 42 U/L (ref 38–126)
ALT: 20 U/L (ref 14–54)
ANION GAP: 10 (ref 5–15)
AST: 21 U/L (ref 15–41)
BILIRUBIN TOTAL: 1.8 mg/dL — AB (ref 0.3–1.2)
BUN: 10 mg/dL (ref 6–20)
CO2: 26 mmol/L (ref 22–32)
Calcium: 9.9 mg/dL (ref 8.9–10.3)
Chloride: 103 mmol/L (ref 101–111)
Creatinine, Ser: 0.96 mg/dL (ref 0.44–1.00)
GFR calc Af Amer: >60 mL/min (ref >60)
GFR calc non Af Amer: >60 mL/min (ref >60)
GLUCOSE: 98 mg/dL (ref 65–99)
POTASSIUM: 3.3 mmol/L — AB (ref 3.5–5.1)
SODIUM: 139 mmol/L (ref 135–145)
TOTAL PROTEIN: 8.5 g/dL — AB (ref 6.5–8.1)

## 2016-04-14 LAB — URINALYSIS, ROUTINE W REFLEX MICROSCOPIC
Bilirubin Urine: NEGATIVE
GLUCOSE, UA: NEGATIVE mg/dL
KETONES UR: 40 mg/dL — AB
NITRITE: NEGATIVE
PROTEIN: NEGATIVE mg/dL
Specific Gravity, Urine: 1.012 (ref 1.005–1.030)
pH: 6.5 (ref 5.0–8.0)

## 2016-04-14 LAB — URINE MICROSCOPIC-ADD ON

## 2016-04-14 LAB — POC URINE PREG, ED: PREG TEST UR: NEGATIVE

## 2016-04-14 LAB — LIPASE, BLOOD: Lipase: 20 U/L (ref 11–51)

## 2016-04-14 MED ORDER — ONDANSETRON HCL 4 MG/2ML IJ SOLN
4.0000 mg | INTRAMUSCULAR | Status: AC
  Administered 2016-04-14: 4 mg via INTRAVENOUS
  Filled 2016-04-14: qty 2

## 2016-04-14 MED ORDER — HALOPERIDOL LACTATE 5 MG/ML IJ SOLN
2.0000 mg | Freq: Once | INTRAMUSCULAR | Status: AC
  Administered 2016-04-14: 2 mg via INTRAVENOUS
  Filled 2016-04-14: qty 1

## 2016-04-14 MED ORDER — ONDANSETRON HCL 4 MG/2ML IJ SOLN
4.0000 mg | Freq: Once | INTRAMUSCULAR | Status: AC
  Administered 2016-04-14: 4 mg via INTRAVENOUS
  Filled 2016-04-14: qty 2

## 2016-04-14 MED ORDER — SODIUM CHLORIDE 0.9 % IV BOLUS (SEPSIS)
1000.0000 mL | Freq: Once | INTRAVENOUS | Status: AC
  Administered 2016-04-14: 1000 mL via INTRAVENOUS

## 2016-04-14 MED ORDER — ACETAMINOPHEN 325 MG PO TABS
650.0000 mg | ORAL_TABLET | Freq: Once | ORAL | Status: AC
  Administered 2016-04-14: 650 mg via ORAL
  Filled 2016-04-14: qty 2

## 2016-04-14 NOTE — ED Provider Notes (Signed)
[facility]-EMERGENCY DEPT Provider Note   CSN: [id number] Arrival date & time: 04/14/16  1322     History   Chief Complaint Chief Complaint  Patient presents with  . Emesis    HPI Angela Christian is a 20 y.o. female.  Angela Christian is a 20 y.o. Female who presents to the ED complaining of intermittent nausea and vomiting for the past two weeks. She was seen for the same in the ER yesterday. Patient reports she was seen in the emergency department 2 weeks ago and was diagnosed with a urinary tract infection. She reports since she's had this intermittent nausea and vomiting. She reports she wakes up in the morning without any nausea, vomiting or abdominal pain and then will develop nausea and vomiting in the afternoon. She reports vomiting several times a day. She reports diffuse abdominal pain and bilateral back pain. She denies hematemesis or hematochezia. No diarrhea. No previous abdominal surgeries. She does report she smokes marijuana daily. She last smoked marijuana yesterday. She reports some slight pain after urinating recently. No dysuria. She denies fevers, hematemesis, hematochezia, lightheadedness, syncope, dysuria, hematuria, urinary frequency, urinary urgency, vaginal bleeding, vaginal discharge or rashes.   The history is provided by the patient and medical records. No language interpreter was used.  Emesis   Associated symptoms include abdominal pain. Pertinent negatives include no chills, no cough, no diarrhea, no fever and no headaches.    Past Medical History:  Diagnosis Date  . Anxiety   . Cyclical vomiting syndrome   . Schizophrenia [facility](HCC)     Patient Active Problem List   Diagnosis Date Noted  . MDD (major depressive disorder) (HCC) 09/10/2015  . Major depressive disorder, recurrent episode (HCC) 09/10/2015  . Polysubstance abuse 09/10/2015  . Unspecified mood (affective) disorder (HCC) 09/10/2015  . Secondary amenorrhea 02/27/2015  . Cyclical vomiting  syndrome 02/10/2015    Past Surgical History:  Procedure Laterality Date  . TONSILLECTOMY      OB History    Gravida Para Term Preterm AB Living   0 0 0 0 0 0   SAB TAB Ectopic Multiple Live Births   0 0 0 0         Home Medications    Prior to Admission medications   Medication Sig Start Date End Date Taking? Authorizing Provider  promethazine (PHENERGAN) 25 MG tablet Take 1 tablet (25 mg total) by mouth every 6 (six) hours as needed for nausea or vomiting. 04/10/16  Yes Benjiman CoreNathan Pickering, MD  cephALEXin (KEFLEX) 500 MG capsule Take 1 capsule (500 mg total) by mouth 4 (four) times daily. Patient not taking: Reported on 04/14/2016 04/06/16   Geoffery Lyonsouglas Delo, MD  HYDROcodone-acetaminophen (NORCO) 5-325 MG tablet Take 1-2 tablets by mouth every 6 (six) hours as needed. Patient not taking: Reported on 04/14/2016 04/06/16   Geoffery Lyonsouglas Delo, MD  ondansetron (ZOFRAN ODT) 8 MG disintegrating tablet 8mg  ODT q4 hours prn nausea 04/06/16   Geoffery Lyonsouglas Delo, MD  promethazine (PHENERGAN) 25 MG suppository Place 1 suppository (25 mg total) rectally every 6 (six) hours as needed for nausea or vomiting. 04/13/16   Carlene CoriaSerena Y Sam, PA-C    Family History Family History  Problem Relation Age of Onset  . Hypertension Mother     Social History Social History  Substance Use Topics  . Smoking status: Current Every Day Smoker    Packs/day: 0.50  . Smokeless tobacco: Never Used  . Alcohol use Yes     Comment: occ  Allergies   Review of patient's allergies indicates no known allergies.   Review of Systems Review of Systems  Constitutional: Negative for chills and fever.  HENT: Negative for congestion and sore throat.   Eyes: Negative for visual disturbance.  Respiratory: Negative for cough and shortness of breath.   Cardiovascular: Negative for chest pain.  Gastrointestinal: Positive for abdominal pain, nausea and vomiting. Negative for blood in stool and diarrhea.  Genitourinary: Negative for  difficulty urinating, dysuria, flank pain, frequency, hematuria, pelvic pain, urgency, vaginal bleeding and vaginal discharge.  Musculoskeletal: Positive for back pain. Negative for neck pain.  Skin: Negative for rash.  Neurological: Negative for syncope, light-headedness and headaches.     Physical Exam Updated Vital Signs BP 128/80   Pulse 64   Temp 98.8 F (37.1 C) (Oral)   Resp 16   LMP 03/30/2016 (Approximate) Comment: negative urine pregnancy test 04/06/16  SpO2 100%   Physical Exam  Constitutional: She appears well-developed and well-nourished. No distress.  Nontoxic appearing.  HENT:  Head: Normocephalic and atraumatic.  Mouth/Throat: Oropharynx is clear and moist.  Mucous membranes are moist.  Eyes: Conjunctivae are normal. Pupils are equal, round, and reactive to light. Right eye exhibits no discharge. Left eye exhibits no discharge.  Neck: Neck supple.  Cardiovascular: Normal rate, regular rhythm, normal heart sounds and intact distal pulses.  Exam reveals no gallop and no friction rub.   No murmur heard. Pulmonary/Chest: Effort normal and breath sounds normal. No respiratory distress. She has no wheezes. She has no rales.  Abdominal: Soft. Bowel sounds are normal. She exhibits no distension and no mass. There is tenderness. There is no rebound and no guarding.  Abdomen soft. Bowel sounds are present. Patient has mild generalized abdominal tenderness to palpation without focal tenderness. No CVA or flank tenderness. No psoas or obturator sign. No rebound tenderness.  Musculoskeletal: She exhibits no edema.  Lymphadenopathy:    She has no cervical adenopathy.  Neurological: She is alert. Coordination normal.  Skin: Skin is warm and dry. Capillary refill takes less than 2 seconds. No rash noted. She is not diaphoretic. No erythema. No pallor.  Psychiatric: She has a normal mood and affect. Her behavior is normal.  Nursing note and vitals reviewed.    ED Treatments /  Results  Labs (all labs ordered are listed, but only abnormal results are displayed) Labs Reviewed  COMPREHENSIVE METABOLIC PANEL - Abnormal; Notable for the following:       Result Value   Potassium 3.3 (*)    Total Protein 8.5 (*)    Albumin 5.2 (*)    Total Bilirubin 1.8 (*)    All other components within normal limits  CBC WITH DIFFERENTIAL/PLATELET - Abnormal; Notable for the following:    Hemoglobin 16.3 (*)    HCT 47.4 (*)    MCV 102.8 (*)    MCH 35.4 (*)    Neutro Abs 8.6 (*)    All other components within normal limits  URINALYSIS, ROUTINE W REFLEX MICROSCOPIC (NOT AT [facility]) - Abnormal; Notable for the following:    APPearance CLOUDY (*)    Hgb urine dipstick SMALL (*)    Ketones, ur 40 (*)    Leukocytes, UA SMALL (*)    All other components within normal limits  URINE MICROSCOPIC-ADD ON - Abnormal; Notable for the following:    Squamous Epithelial / LPF 0-5 (*)    Bacteria, UA RARE (*)    All other components within normal limits  LIPASE,  BLOOD  POC URINE PREG, ED    EKG  EKG Interpretation None       Radiology No results found.  Procedures Procedures (including critical care time)  Medications Ordered in ED Medications  sodium chloride 0.9 % bolus 1,000 mL (0 mLs Intravenous Stopped 04/14/16 1712)  ondansetron (ZOFRAN) injection 4 mg (4 mg Intravenous Given 04/14/16 1518)  haloperidol lactate (HALDOL) injection 2 mg (2 mg Intravenous Given 04/14/16 1550)  acetaminophen (TYLENOL) tablet 650 mg (650 mg Oral Given 04/14/16 1551)  ondansetron (ZOFRAN) injection 4 mg (4 mg Intravenous Given 04/14/16 1751)     Initial Impression / Assessment and Plan / ED Course  I have reviewed the triage vital signs and the nursing notes.  Pertinent labs & imaging results that were available during my care of the patient were reviewed by me and considered in my medical decision making (see chart for details).  Clinical Course   This is a 20 y.o. Female who presents to  the ED complaining of intermittent nausea and vomiting for the past two weeks. She was seen for the same in the ER yesterday. Patient reports she was seen in the emergency department 2 weeks ago and was diagnosed with a urinary tract infection. She reports since she's had this intermittent nausea and vomiting. She reports she wakes up in the morning without any nausea, vomiting or abdominal pain and then will develop nausea and vomiting in the afternoon. She reports vomiting several times a day. She reports diffuse abdominal pain and bilateral back pain. She denies hematemesis or hematochezia. No diarrhea. No previous abdominal surgeries. She does report she smokes marijuana daily. She last smoked marijuana yesterday. On exam the patient is afebrile nontoxic appearing. She is mild generalized abdominal tenderness to palpation without focal tenderness. No CVA or flank tenderness. No peritoneal signs. CMP is remarkable only for potassium at 3.3 which is an improvement from last time and total bilirubin of 1.8. Lipase is within normal limits. CBC shows no leukocytosis. Pregnancy test is negative. Urinalysis is nitrite negative with small leukocytes and rare bacteria. Urine was sent for culture yesterday and is still pending. Have low suspicion for UTI. She recently had a urine culture with no growth. Patient continues to use marijuana daily. Concern for some cyclic vomiting. Patient received Tylenol and Haldol without vomiting. She reported feeling better. She then tolerated ginger ale and report requested to be discharged home because she is feeling better. Patient reports she has plenty of Zofran and Phenergan at home and does not require any prescriptions for refill. She also has Nexium at home. I encouraged her to take this. She just started taking this yesterday. I discussed strict and specific return precautions. I discussed for a long time about stopping marijuana use.  I advised the patient to follow-up with  their primary care provider this week. I advised the patient to return to the emergency department with new or worsening symptoms or new concerns. The patient verbalized understanding and agreement with plan.      Final Clinical Impressions(s) / ED Diagnoses   Final diagnoses:  Generalized abdominal pain  Non-intractable vomiting with nausea, vomiting of unspecified type  Marijuana abuse    New Prescriptions New Prescriptions   No medications on file     Everlene Farrier, PA-C 04/14/16 1817    Pricilla Loveless, MD 04/19/16 1704

## 2016-04-14 NOTE — ED Notes (Signed)
Pt found in the hall attempting to take out her own IV as she was wanting to leave.  RN returned Pt to rooma nd removed IV.  Pt refused VS or to sign.  Pt would not take DC papers with her.

## 2016-04-14 NOTE — Discharge Instructions (Signed)
Please stop using marijuana.

## 2016-04-14 NOTE — ED Triage Notes (Signed)
Per [facility] pt from home co NV x 2 weeks. abd p[ain. Was seen recently for similar symptoms. Alert and oriented x 4. 112/72  HR 80 RR 16 96 % RA

## 2016-04-15 LAB — URINE CULTURE: Culture: <10000 — AB

## 2016-04-17 ENCOUNTER — Emergency Department (HOSPITAL_COMMUNITY)
Admission: EM | Admit: 2016-04-17 | Discharge: 2016-04-17 | Disposition: A | Discharge status: Home / Self Care | Payer: Medicaid Other | Attending: Emergency Medicine | Admitting: Emergency Medicine

## 2016-04-17 ENCOUNTER — Encounter (HOSPITAL_COMMUNITY): Payer: Self-pay | Admitting: *Deleted

## 2016-04-17 DIAGNOSIS — R1115 Cyclical vomiting syndrome unrelated to migraine: Secondary | ICD-10-CM

## 2016-04-17 DIAGNOSIS — Z79899 Other long term (current) drug therapy: Secondary | ICD-10-CM | POA: Insufficient documentation

## 2016-04-17 DIAGNOSIS — F17200 Nicotine dependence, unspecified, uncomplicated: Secondary | ICD-10-CM | POA: Insufficient documentation

## 2016-04-17 DIAGNOSIS — G43A0 Cyclical vomiting, not intractable: Secondary | ICD-10-CM | POA: Insufficient documentation

## 2016-04-17 LAB — URINE MICROSCOPIC-ADD ON: WBC, UA: NONE SEEN WBC/hpf (ref 0–5)

## 2016-04-17 LAB — I-STAT CHEM 8, ED
BUN: 15 mg/dL (ref 6–20)
CHLORIDE: 104 mmol/L (ref 101–111)
CREATININE: 0.8 mg/dL (ref 0.44–1.00)
Calcium, Ion: 1.04 mmol/L — ABNORMAL LOW (ref 1.15–1.40)
GLUCOSE: 87 mg/dL (ref 65–99)
HCT: 50 % — ABNORMAL HIGH (ref 36.0–46.0)
Hemoglobin: 17 g/dL — ABNORMAL HIGH (ref 12.0–15.0)
POTASSIUM: 3.9 mmol/L (ref 3.5–5.1)
Sodium: 138 mmol/L (ref 135–145)
TCO2: 24 mmol/L (ref 0–100)

## 2016-04-17 LAB — URINALYSIS, ROUTINE W REFLEX MICROSCOPIC
Glucose, UA: NEGATIVE mg/dL
Ketones, ur: 40 mg/dL — AB
LEUKOCYTES UA: NEGATIVE
Nitrite: NEGATIVE
PROTEIN: 30 mg/dL — AB
Specific Gravity, Urine: 1.031 — ABNORMAL HIGH (ref 1.005–1.030)
pH: 6.5 (ref 5.0–8.0)

## 2016-04-17 LAB — RAPID URINE DRUG SCREEN, HOSP PERFORMED
AMPHETAMINES: NOT DETECTED
BENZODIAZEPINES: NOT DETECTED
Barbiturates: NOT DETECTED
Cocaine: NOT DETECTED
OPIATES: NOT DETECTED
Tetrahydrocannabinol: POSITIVE — AB

## 2016-04-17 LAB — I-STAT BETA HCG BLOOD, ED (MC, WL, AP ONLY): I-stat hCG, quantitative: <5 m[IU]/mL (ref <5)

## 2016-04-17 MED ORDER — PROMETHAZINE HCL 25 MG/ML IJ SOLN
12.5000 mg | Freq: Once | INTRAMUSCULAR | Status: AC
  Administered 2016-04-17: 12.5 mg via INTRAVENOUS
  Filled 2016-04-17: qty 1

## 2016-04-17 MED ORDER — FAMOTIDINE 20 MG PO TABS
20.0000 mg | ORAL_TABLET | Freq: Once | ORAL | Status: AC
  Administered 2016-04-17: 20 mg via ORAL
  Filled 2016-04-17: qty 1

## 2016-04-17 MED ORDER — SODIUM CHLORIDE 0.9 % IV BOLUS (SEPSIS)
1000.0000 mL | Freq: Once | INTRAVENOUS | Status: AC
  Administered 2016-04-17: 1000 mL via INTRAVENOUS

## 2016-04-17 MED ORDER — KETOROLAC TROMETHAMINE 30 MG/ML IJ SOLN
30.0000 mg | Freq: Once | INTRAMUSCULAR | Status: AC
  Administered 2016-04-17: 30 mg via INTRAVENOUS
  Filled 2016-04-17: qty 1

## 2016-04-17 MED ORDER — SODIUM CHLORIDE 0.9 % IV BOLUS (SEPSIS)
500.0000 mL | Freq: Once | INTRAVENOUS | Status: AC
  Administered 2016-04-17: 500 mL via INTRAVENOUS

## 2016-04-17 NOTE — ED Notes (Signed)
No respiratory or acute distress noted alert and oriented x 3 call light in reach no reaction to medication noted. 

## 2016-04-17 NOTE — ED Notes (Signed)
Unable to locate pt to give discharge instructions and prescription [facility].

## 2016-04-17 NOTE — ED Provider Notes (Signed)
[facility]-EMERGENCY DEPT Provider Note   CSN: [id number] Arrival date & time: 04/17/16  1451     History   Chief Complaint Chief Complaint  Patient presents with  . Emesis    HPI Angela Christian is a 20 y.o. female.  The history is provided by the patient and medical records. No language interpreter was used.  Emesis   Associated symptoms include abdominal pain. Pertinent negatives include no chills, no cough, no diarrhea, no fever and no headaches.   Angela Christian is a 20 y.o. female  with a PMH of schizophrenia, cyclical vomiting syndrome, anxiety who presents to the Emergency Department complaining of persistent generalized abdominal pain and n/v for the last 2 weeks. Patient states that she tolerates fluids fine, but every time she eats she has episode of emesis. Patient also endorses bilateral low back pain. She states that she was treated for a UTI on 9/19 and is worried it may have come back. No fevers, dysuria, urinary urgency/frequency, vaginal discharge, diarrhea, constipation, blood in stool. Urine cultures from prior visits reviewed with no clinically significant growth. Patient denies drug use in the last month including THC however per chart review she has endorsed smoking marijuana daily at multiple visits in the last two weeks.   Past Medical History:  Diagnosis Date  . Anxiety   . Cyclical vomiting syndrome   . Schizophrenia [facility])     Patient Active Problem List   Diagnosis Date Noted  . MDD (major depressive disorder) (HCC) 09/10/2015  . Major depressive disorder, recurrent episode (HCC) 09/10/2015  . Polysubstance abuse 09/10/2015  . Unspecified mood (affective) disorder (HCC) 09/10/2015  . Secondary amenorrhea 02/27/2015  . Cyclical vomiting syndrome 02/10/2015    Past Surgical History:  Procedure Laterality Date  . TONSILLECTOMY      OB History    Gravida Para Term Preterm AB Living   0 0 0 0 0 0   SAB TAB Ectopic Multiple Live Births   0 0 0 0          Home Medications    Prior to Admission medications   Medication Sig Start Date End Date Taking? Authorizing Provider  ibuprofen (ADVIL,MOTRIN) 200 MG tablet Take 400 mg by mouth every 6 (six) hours as needed for moderate pain.   Yes Historical Provider, MD  promethazine (PHENERGAN) 25 MG tablet Take 1 tablet (25 mg total) by mouth every 6 (six) hours as needed for nausea or vomiting. 04/10/16  Yes Benjiman Core, MD  cephALEXin (KEFLEX) 500 MG capsule Take 1 capsule (500 mg total) by mouth 4 (four) times daily. Patient not taking: Reported on 04/17/2016 04/06/16   Geoffery Lyons, MD  HYDROcodone-acetaminophen (NORCO) 5-325 MG tablet Take 1-2 tablets by mouth every 6 (six) hours as needed. Patient not taking: Reported on 04/17/2016 04/06/16   Geoffery Lyons, MD  ondansetron (ZOFRAN ODT) 8 MG disintegrating tablet 8mg  ODT q4 hours prn nausea Patient not taking: Reported on 04/17/2016 04/06/16   Geoffery Lyons, MD  promethazine (PHENERGAN) 25 MG suppository Place 1 suppository (25 mg total) rectally every 6 (six) hours as needed for nausea or vomiting. Patient not taking: Reported on 04/17/2016 04/13/16   Carlene Coria, PA-C    Family History Family History  Problem Relation Age of Onset  . Hypertension Mother     Social History Social History  Substance Use Topics  . Smoking status: Current Every Day Smoker    Packs/day: 0.50  . Smokeless tobacco: Never Used  .  Alcohol use Yes     Comment: occ     Allergies   Review of patient's allergies indicates no known allergies.   Review of Systems Review of Systems  Constitutional: Negative for chills and fever.  HENT: Negative for congestion.   Eyes: Negative for visual disturbance.  Respiratory: Negative for cough and shortness of breath.   Cardiovascular: Negative.   Gastrointestinal: Positive for abdominal pain, nausea and vomiting. Negative for blood in stool, constipation and diarrhea.  Genitourinary: Negative for dysuria,  frequency, urgency, vaginal discharge and vaginal pain.  Musculoskeletal: Positive for back pain. Negative for neck pain.  Skin: Negative for rash.  Neurological: Negative for headaches.     Physical Exam Updated Vital Signs BP 131/75 (BP Location: Right Arm)   Pulse 95   Temp 99.1 F (37.3 C) (Oral)   Resp 16   Ht 5\' 4"  (1.626 m)   LMP 03/30/2016 (Approximate) Comment: negative urine pregnancy test 04/06/16  SpO2 99%   Physical Exam  Constitutional: She is oriented to person, place, and time. She appears well-developed and well-nourished. No distress.  HENT:  Head: Normocephalic and atraumatic.  OP clear. Tacky mucus membranes.   Cardiovascular: Normal rate, regular rhythm and normal heart sounds.   No murmur heard. Pulmonary/Chest: Effort normal and breath sounds normal. No respiratory distress.  Abdominal: Soft. Bowel sounds are normal. She exhibits no distension. There is tenderness (Generalized).  Musculoskeletal: Normal range of motion.  Neurological: She is alert and oriented to person, place, and time.  Skin: Skin is warm and dry.  Nursing note and vitals reviewed.   ED Treatments / Results  Labs (all labs ordered are listed, but only abnormal results are displayed) Labs Reviewed  URINALYSIS, ROUTINE W REFLEX MICROSCOPIC (NOT AT [facility]) - Abnormal; Notable for the following:       Result Value   Color, Urine AMBER (*)    APPearance CLOUDY (*)    Specific Gravity, Urine 1.031 (*)    Hgb urine dipstick TRACE (*)    Bilirubin Urine SMALL (*)    Ketones, ur 40 (*)    Protein, ur 30 (*)    All other components within normal limits  URINE RAPID DRUG SCREEN, HOSP PERFORMED - Abnormal; Notable for the following:    Tetrahydrocannabinol POSITIVE (*)    All other components within normal limits  URINE MICROSCOPIC-ADD ON - Abnormal; Notable for the following:    Squamous Epithelial / LPF 0-5 (*)    Bacteria, UA FEW (*)    All other components within normal limits    I-STAT CHEM 8, ED - Abnormal; Notable for the following:    Calcium, Ion 1.04 (*)    Hemoglobin 17.0 (*)    HCT 50.0 (*)    All other components within normal limits  I-STAT BETA HCG BLOOD, ED ([facility], [facility], [facility] ONLY)    EKG  EKG Interpretation None       Radiology No results found.  Procedures Procedures (including critical care time)  Medications Ordered in ED Medications  sodium chloride 0.9 % bolus 500 mL (0 mLs Intravenous Stopped 04/17/16 1803)  ketorolac (TORADOL) 30 MG/ML injection 30 mg (30 mg Intravenous Given 04/17/16 1648)  promethazine (PHENERGAN) injection 12.5 mg (12.5 mg Intravenous Given 04/17/16 1651)  sodium chloride 0.9 % bolus 1,000 mL (0 mLs Intravenous Stopped 04/17/16 1928)  promethazine (PHENERGAN) injection 12.5 mg (12.5 mg Intravenous Given 04/17/16 1855)  famotidine (PEPCID) tablet 20 mg (20 mg Oral Given 04/17/16 1926)  Initial Impression / Assessment and Plan / ED Course  I have reviewed the triage vital signs and the nursing notes.  Pertinent labs & imaging results that were available during my care of the patient were reviewed by me and considered in my medical decision making (see chart for details).  Clinical Course   Angela Christian presents to ED for persistent generalized abdominal pain and n/v x 2 weeks. Seen on 9/23 and 9/27 for the same and chart reviewed from these visits. On exam, patient with nonsurgical abdomen. Labs obtained on 9/23, 9/26 and 9/27. I do not believe full abdominal labs are warranted given reassuring labs x 3 recently. Patient is requesting blood pregnancy test which was obtained and negative. Chem 8 with no elyte abnormalities or AKI. UA with no signs of infection. UDS + for [facility] which is likely contributory.   Patient re-evaluated after fluids, toradol and phenergan. She is resting comfortably with no emesis since med administration. Repeat abdominal exam unchanged. Evaluation does not show pathology that would require  ongoing emergent intervention or inpatient treatment. Patient is hemodynamically stable and mentating appropriately. PCP follow up strongly encouraged. She has rx for phenergan suppositories but did not get medication filled. I asked if she needed a new prescription for phenergan suppositories which she declined. Return precautions discussed and all questions answered.   Per nursing staff, patient left prior to receiving discharge paperwork. I did orally discuss everything that was included on written paperwork with patient at the bedside.   Final Clinical Impressions(s) / ED Diagnoses   Final diagnoses:  Non-intractable cyclical vomiting with nausea    New Prescriptions Discharge Medication List as of 04/17/2016  8:03 PM       [facility] Center[facility] Pilcher [facility], PA-C 04/17/16 [id number]    Angela MuldersScott Zackowski, MD 04/18/16 1710

## 2016-04-17 NOTE — ED Triage Notes (Signed)
Pt presents via EMS from home with c/o nausea and vomiting. Per EMS, pt reported that she recently had a kidney infection and was given antibiotics. Pt reports that after her course of antibiotics, she has been nauseated and vomiting. Pt reports she also has pain whenever she has a bowel movement. Denies any pain with urination.

## 2016-04-17 NOTE — ED Notes (Addendum)
Unable to locate pt IV with cathlon intact found in trash can pt pulled out own Iv. Pt was not up for discharge. [facility].

## 2016-04-17 NOTE — Discharge Instructions (Signed)
Please follow up with your primary physician for discussion of today's diagnosis.  Return to ER for new or worsening symptoms, any additional concerns.

## 2016-04-28 ENCOUNTER — Encounter (HOSPITAL_COMMUNITY): Payer: Self-pay | Admitting: Neurology

## 2016-04-28 ENCOUNTER — Emergency Department (HOSPITAL_COMMUNITY)
Admission: EM | Admit: 2016-04-28 | Discharge: 2016-04-28 | Disposition: A | Discharge status: Home / Self Care | Payer: Medicaid Other | Attending: Emergency Medicine | Admitting: Emergency Medicine

## 2016-04-28 DIAGNOSIS — Z79899 Other long term (current) drug therapy: Secondary | ICD-10-CM | POA: Insufficient documentation

## 2016-04-28 DIAGNOSIS — F17200 Nicotine dependence, unspecified, uncomplicated: Secondary | ICD-10-CM | POA: Insufficient documentation

## 2016-04-28 DIAGNOSIS — R112 Nausea with vomiting, unspecified: Secondary | ICD-10-CM | POA: Insufficient documentation

## 2016-04-28 DIAGNOSIS — R1110 Vomiting, unspecified: Secondary | ICD-10-CM

## 2016-04-28 LAB — COMPREHENSIVE METABOLIC PANEL
ALBUMIN: 4.5 g/dL (ref 3.5–5.0)
ALK PHOS: 47 U/L (ref 38–126)
ALT: 18 U/L (ref 14–54)
ANION GAP: 10 (ref 5–15)
AST: 26 U/L (ref 15–41)
BILIRUBIN TOTAL: 1 mg/dL (ref 0.3–1.2)
BUN: 10 mg/dL (ref 6–20)
CALCIUM: 10.5 mg/dL — AB (ref 8.9–10.3)
CO2: 24 mmol/L (ref 22–32)
Chloride: 106 mmol/L (ref 101–111)
Creatinine, Ser: 0.82 mg/dL (ref 0.44–1.00)
GFR calc Af Amer: >60 mL/min (ref >60)
GFR calc non Af Amer: >60 mL/min (ref >60)
GLUCOSE: 118 mg/dL — AB (ref 65–99)
Potassium: 4.1 mmol/L (ref 3.5–5.1)
Sodium: 140 mmol/L (ref 135–145)
TOTAL PROTEIN: 7.7 g/dL (ref 6.5–8.1)

## 2016-04-28 LAB — URINALYSIS, ROUTINE W REFLEX MICROSCOPIC
BILIRUBIN URINE: NEGATIVE
Glucose, UA: NEGATIVE mg/dL
KETONES UR: 15 mg/dL — AB
Leukocytes, UA: NEGATIVE
NITRITE: NEGATIVE
Protein, ur: 30 mg/dL — AB
Specific Gravity, Urine: 1.036 — ABNORMAL HIGH (ref 1.005–1.030)
pH: 6 (ref 5.0–8.0)

## 2016-04-28 LAB — LIPASE, BLOOD: Lipase: 17 U/L (ref 11–51)

## 2016-04-28 LAB — CBC
HCT: 47.2 % — ABNORMAL HIGH (ref 36.0–46.0)
Hemoglobin: 16.8 g/dL — ABNORMAL HIGH (ref 12.0–15.0)
MCH: 35.7 pg — ABNORMAL HIGH (ref 26.0–34.0)
MCHC: 35.6 g/dL (ref 30.0–36.0)
MCV: 100.4 fL — ABNORMAL HIGH (ref 78.0–100.0)
PLATELETS: 358 10*3/uL (ref 150–400)
RBC: 4.7 MIL/uL (ref 3.87–5.11)
RDW: 11.4 % — AB (ref 11.5–15.5)
WBC: 17.5 10*3/uL — ABNORMAL HIGH (ref 4.0–10.5)

## 2016-04-28 LAB — URINE MICROSCOPIC-ADD ON

## 2016-04-28 LAB — I-STAT BETA HCG BLOOD, ED (MC, WL, AP ONLY)

## 2016-04-28 MED ORDER — FAMOTIDINE 20 MG PO TABS
20.0000 mg | ORAL_TABLET | Freq: Once | ORAL | Status: AC
  Administered 2016-04-28: 20 mg via ORAL
  Filled 2016-04-28: qty 1

## 2016-04-28 MED ORDER — METOCLOPRAMIDE HCL 10 MG PO TABS: 10.0000 mg | ORAL_TABLET | Freq: Four times a day (QID) | ORAL | 0 refills | Status: DC

## 2016-04-28 MED ORDER — ONDANSETRON 4 MG PO TBDP
4.0000 mg | ORAL_TABLET | Freq: Once | ORAL | Status: AC
  Administered 2016-04-28: 4 mg via ORAL
  Filled 2016-04-28: qty 1

## 2016-04-28 MED ORDER — SODIUM CHLORIDE 0.9 % IV BOLUS (SEPSIS)
1000.0000 mL | Freq: Once | INTRAVENOUS | Status: AC
  Administered 2016-04-28: 1000 mL via INTRAVENOUS

## 2016-04-28 MED ORDER — METOCLOPRAMIDE HCL 5 MG/ML IJ SOLN
10.0000 mg | Freq: Once | INTRAMUSCULAR | Status: AC
  Administered 2016-04-28: 10 mg via INTRAVENOUS
  Filled 2016-04-28: qty 2

## 2016-04-28 NOTE — ED Provider Notes (Signed)
[facility]-EMERGENCY DEPT Provider Note   CSN: [id number] Arrival date & time: 04/28/16  1211     History   Chief Complaint Chief Complaint  Patient presents with  . Emesis  . Abdominal Pain    HPI Angela Christian is a 20 y.o. female.  The history is provided by the patient and medical records. No language interpreter was used.   Angela Christian is a 20 y.o. female  with a PMH of cyclical vomiting syndrome, schizophrenia and anxiety who presents to the Emergency Department complaining of emesis after eating for the last two days. Patient states that the only time she has an upset stomach or episode of emesis is after eating. Associated symptoms include lower abdominal pain which she believes is 2/2 menstrual cramping and constipation, stating that her last BM was 2-3 days ago and she had to strain to have BM. She reports mucus in BM but denies blood. She is passing gas. No fever, dysuria, urinary urgency/frequency, vaginal discharge, chest pain, shortness of breath.   Past Medical History:  Diagnosis Date  . Anxiety   . Cyclical vomiting syndrome   . Schizophrenia [facility](HCC)     Patient Active Problem List   Diagnosis Date Noted  . MDD (major depressive disorder) 09/10/2015  . Major depressive disorder, recurrent episode (HCC) 09/10/2015  . Polysubstance abuse 09/10/2015  . Unspecified mood (affective) disorder (HCC) 09/10/2015  . Secondary amenorrhea 02/27/2015  . Cyclical vomiting syndrome 02/10/2015    Past Surgical History:  Procedure Laterality Date  . TONSILLECTOMY      OB History    Gravida Para Term Preterm AB Living   0 0 0 0 0 0   SAB TAB Ectopic Multiple Live Births   0 0 0 0         Home Medications    Prior to Admission medications   Medication Sig Start Date End Date Taking? Authorizing Provider  Vitamins A & D (VITAMIN A & D) ointment Apply 1 application topically as needed for dry skin.   Yes Historical Provider, MD  cephALEXin (KEFLEX) 500 MG  capsule Take 1 capsule (500 mg total) by mouth 4 (four) times daily. Patient not taking: Reported on 04/28/2016 04/06/16   Geoffery Lyonsouglas Delo, MD  HYDROcodone-acetaminophen (NORCO) 5-325 MG tablet Take 1-2 tablets by mouth every 6 (six) hours as needed. Patient not taking: Reported on 04/28/2016 04/06/16   Geoffery Lyonsouglas Delo, MD  metoCLOPramide (REGLAN) 10 MG tablet Take 1 tablet (10 mg total) by mouth every 6 (six) hours. 04/28/16   Chase PicketJaime Pilcher Ward, PA-C  ondansetron (ZOFRAN ODT) 8 MG disintegrating tablet 8mg  ODT q4 hours prn nausea Patient not taking: Reported on 04/28/2016 04/06/16   Geoffery Lyonsouglas Delo, MD  promethazine (PHENERGAN) 25 MG suppository Place 1 suppository (25 mg total) rectally every 6 (six) hours as needed for nausea or vomiting. Patient not taking: Reported on 04/28/2016 04/13/16   Ace GinsSerena Y Sam, PA-C  promethazine (PHENERGAN) 25 MG tablet Take 1 tablet (25 mg total) by mouth every 6 (six) hours as needed for nausea or vomiting. Patient not taking: Reported on 04/28/2016 04/10/16   Benjiman CoreNathan Pickering, MD    Family History Family History  Problem Relation Age of Onset  . Hypertension Mother     Social History Social History  Substance Use Topics  . Smoking status: Current Every Day Smoker    Packs/day: 0.50  . Smokeless tobacco: Never Used  . Alcohol use Yes     Comment: occ  Allergies   Review of patient's allergies indicates no known allergies.   Review of Systems Review of Systems  Constitutional: Negative for chills and fever.  HENT: Negative for congestion.   Eyes: Negative for visual disturbance.  Respiratory: Negative for cough and shortness of breath.   Cardiovascular: Negative.   Gastrointestinal: Positive for constipation and vomiting. Negative for abdominal pain, blood in stool and nausea.  Genitourinary: Negative for dysuria.  Musculoskeletal: Negative for back pain and neck pain.  Skin: Negative for rash.  Neurological: Negative for headaches.     Physical  Exam Updated Vital Signs BP 126/66 (BP Location: Left Arm)   Pulse 98   Temp 98.6 F (37 C) (Oral)   Resp 14   LMP 03/30/2016 (Approximate) Comment: negative urine pregnancy test 04/06/16  SpO2 100%   Physical Exam  Constitutional: She is oriented to person, place, and time. She appears well-developed and well-nourished. No distress.  HENT:  Head: Normocephalic and atraumatic.  OP clear. Tacky mucus membranes.   Cardiovascular: Normal rate, regular rhythm and normal heart sounds.  Exam reveals no gallop and no friction rub.   No murmur heard. Pulmonary/Chest: Effort normal and breath sounds normal. No respiratory distress.  Abdominal: Soft. Bowel sounds are normal. She exhibits no distension. There is no tenderness.  Generalized abdominal tenderness with no focal areas of tenderness. Negative Murphy's. Negative psoas and obturator.   Musculoskeletal: Normal range of motion.  Neurological: She is alert and oriented to person, place, and time.  Skin: Skin is warm and dry.  Nursing note and vitals reviewed.    ED Treatments / Results  Labs (all labs ordered are listed, but only abnormal results are displayed) Labs Reviewed  COMPREHENSIVE METABOLIC PANEL - Abnormal; Notable for the following:       Result Value   Glucose, Bld 118 (*)    Calcium 10.5 (*)    All other components within normal limits  CBC - Abnormal; Notable for the following:    WBC 17.5 (*)    Hemoglobin 16.8 (*)    HCT 47.2 (*)    MCV 100.4 (*)    MCH 35.7 (*)    RDW 11.4 (*)    All other components within normal limits  URINALYSIS, ROUTINE W REFLEX MICROSCOPIC (NOT AT [facility]) - Abnormal; Notable for the following:    APPearance HAZY (*)    Specific Gravity, Urine 1.036 (*)    Hgb urine dipstick MODERATE (*)    Ketones, ur 15 (*)    Protein, ur 30 (*)    All other components within normal limits  URINE MICROSCOPIC-ADD ON - Abnormal; Notable for the following:    Squamous Epithelial / LPF 6-30 (*)     Bacteria, UA FEW (*)    Casts GRANULAR CAST (*)    All other components within normal limits  LIPASE, BLOOD  I-STAT BETA HCG BLOOD, ED ([facility], [facility], [facility] ONLY)    EKG  EKG Interpretation None       Radiology No results found.  Procedures Procedures (including critical care time)  Medications Ordered in ED Medications  famotidine (PEPCID) tablet 20 mg (not administered)  sodium chloride 0.9 % bolus 1,000 mL (0 mLs Intravenous Stopped 04/28/16 1733)  metoCLOPramide (REGLAN) injection 10 mg (10 mg Intravenous Given 04/28/16 1623)  sodium chloride 0.9 % bolus 1,000 mL (1,000 mLs Intravenous New Bag/Given 04/28/16 1905)     Initial Impression / Assessment and Plan / ED Course  I have reviewed the triage vital  signs and the nursing notes.  Pertinent labs & imaging results that were available during my care of the patient were reviewed by me and considered in my medical decision making (see chart for details).  Clinical Course   Angela Christian is a 20 y.o. female who presents to ED for vomiting after meals for the last two days. Patient is very well appearing, afebrile and hemodynamically stable. She does appear dry on exam. Labs also support dehydration. Abdominal exam reassuring with no focal areas of tenderness, no rebound/guarding. No CVA tenderness. UA without signs of infection. White count is elevated today and this was discussed with attending, Dr. Rubin Payor. Will start IV fluids and Reglan then re-evaluate.   5:36 PM - Patient reevaluated and feels improved after reglan. Will continue IV fluids and PO challenge.   Patient has tolerated PO well with no episodes of emesis throughout ED stay. 2L IV fluids given. Patient feels comfortable with discharge to home. I have spoken at length about the importance of calling tomorrow morning to schedule a follow up appointment with GI and patient agrees to do so. Abdominal pain return precautions given and all questions answered.   Final  Clinical Impressions(s) / ED Diagnoses   Final diagnoses:  Non-intractable vomiting, presence of nausea not specified, unspecified vomiting type    New Prescriptions New Prescriptions   METOCLOPRAMIDE (REGLAN) 10 MG TABLET    Take 1 tablet (10 mg total) by mouth every 6 (six) hours.     [facility] Ward, PA-C 04/28/16 2022    Benjiman Core, MD 04/29/16 [phone]

## 2016-04-28 NOTE — Discharge Instructions (Signed)
Please call the GI clinic listed in the morning to schedule a follow up appointment.  Increase hydration, take small sips of water throughout the day.  For constipation, take one cap of Miralax mixed in a full glass of water daily until you are having regular bowel movements.   Please seek immediate care if you develop any of the following symptoms: The pain does not go away.  You have a fever.  You keep throwing up (vomiting). You pass bloody or black tarry stools.  There is bright red blood in the stool.  You have no bowel movement for more than 4 days.  There is rectal pain.  You do not seem to be getting better.  You have any questions or concerns.

## 2016-04-28 NOTE — ED Triage Notes (Signed)
Pt reporting on Monday ate a "big" meal then had episode of vomiting that was bright red/dark red. Episode happened again after eating a big meal on Tuesday. Denies diarrhea. Has hx of cyclic vomiting. Today is vomiting but it is clear and only happens if drinks water. Pt has hiccups at current. Is a x 4. C/o lower abd pain. Is unsure of her LMP but is having some vaginal bleeding now.

## 2016-05-04 ENCOUNTER — Inpatient Hospital Stay: Payer: Self-pay | Admitting: Family Medicine

## 2016-07-07 ENCOUNTER — Encounter (HOSPITAL_COMMUNITY): Payer: Self-pay | Admitting: Emergency Medicine

## 2016-07-07 ENCOUNTER — Emergency Department (HOSPITAL_COMMUNITY): Payer: Self-pay

## 2016-07-07 ENCOUNTER — Emergency Department (HOSPITAL_COMMUNITY)
Admission: EM | Admit: 2016-07-07 | Discharge: 2016-07-07 | Discharge status: Against Medical Advice | Payer: Self-pay | Attending: Emergency Medicine | Admitting: Emergency Medicine

## 2016-07-07 DIAGNOSIS — R1032 Left lower quadrant pain: Secondary | ICD-10-CM | POA: Insufficient documentation

## 2016-07-07 DIAGNOSIS — Z79899 Other long term (current) drug therapy: Secondary | ICD-10-CM | POA: Insufficient documentation

## 2016-07-07 DIAGNOSIS — R112 Nausea with vomiting, unspecified: Secondary | ICD-10-CM | POA: Insufficient documentation

## 2016-07-07 DIAGNOSIS — F17200 Nicotine dependence, unspecified, uncomplicated: Secondary | ICD-10-CM | POA: Insufficient documentation

## 2016-07-07 LAB — CBC
HEMATOCRIT: 46.6 % — AB (ref 36.0–46.0)
HEMOGLOBIN: 16.7 g/dL — AB (ref 12.0–15.0)
MCH: 35.8 pg — ABNORMAL HIGH (ref 26.0–34.0)
MCHC: 35.8 g/dL (ref 30.0–36.0)
MCV: 99.8 fL (ref 78.0–100.0)
Platelets: 256 10*3/uL (ref 150–400)
RBC: 4.67 MIL/uL (ref 3.87–5.11)
RDW: 12.3 % (ref 11.5–15.5)
WBC: 12.4 10*3/uL — ABNORMAL HIGH (ref 4.0–10.5)

## 2016-07-07 LAB — COMPREHENSIVE METABOLIC PANEL
ALBUMIN: 4.8 g/dL (ref 3.5–5.0)
ALT: 19 U/L (ref 14–54)
ANION GAP: 11 (ref 5–15)
AST: 22 U/L (ref 15–41)
Alkaline Phosphatase: 63 U/L (ref 38–126)
BUN: 20 mg/dL (ref 6–20)
CHLORIDE: 98 mmol/L — AB (ref 101–111)
CO2: 28 mmol/L (ref 22–32)
Calcium: 10 mg/dL (ref 8.9–10.3)
Creatinine, Ser: 0.99 mg/dL (ref 0.44–1.00)
GFR calc Af Amer: >60 mL/min (ref >60)
GFR calc non Af Amer: >60 mL/min (ref >60)
GLUCOSE: 101 mg/dL — AB (ref 65–99)
POTASSIUM: 3.5 mmol/L (ref 3.5–5.1)
SODIUM: 137 mmol/L (ref 135–145)
TOTAL PROTEIN: 8.1 g/dL (ref 6.5–8.1)
Total Bilirubin: 1.4 mg/dL — ABNORMAL HIGH (ref 0.3–1.2)

## 2016-07-07 LAB — URINALYSIS, ROUTINE W REFLEX MICROSCOPIC
BACTERIA UA: NONE SEEN
Glucose, UA: NEGATIVE mg/dL
Ketones, ur: 80 mg/dL — AB
NITRITE: NEGATIVE
PROTEIN: 100 mg/dL — AB
Specific Gravity, Urine: 1.035 — ABNORMAL HIGH (ref 1.005–1.030)
pH: 5 (ref 5.0–8.0)

## 2016-07-07 LAB — WET PREP, GENITAL
Clue Cells Wet Prep HPF POC: NONE SEEN
Sperm: NONE SEEN
TRICH WET PREP: NONE SEEN
Yeast Wet Prep HPF POC: NONE SEEN

## 2016-07-07 LAB — LIPASE, BLOOD: LIPASE: 15 U/L (ref 11–51)

## 2016-07-07 LAB — I-STAT BETA HCG BLOOD, ED (MC, WL, AP ONLY)

## 2016-07-07 MED ORDER — STERILE WATER FOR INJECTION IJ SOLN
INTRAMUSCULAR | Status: AC
  Administered 2016-07-07: 10 mL
  Filled 2016-07-07: qty 10

## 2016-07-07 MED ORDER — METOCLOPRAMIDE HCL 5 MG/ML IJ SOLN
10.0000 mg | Freq: Once | INTRAMUSCULAR | Status: AC
  Administered 2016-07-07: 10 mg via INTRAVENOUS
  Filled 2016-07-07: qty 2

## 2016-07-07 MED ORDER — ONDANSETRON 4 MG PO TBDP
4.0000 mg | ORAL_TABLET | Freq: Once | ORAL | Status: AC | PRN
  Administered 2016-07-07: 4 mg via ORAL
  Filled 2016-07-07: qty 1

## 2016-07-07 MED ORDER — IOPAMIDOL (ISOVUE-300) INJECTION 61%
INTRAVENOUS | Status: AC
  Filled 2016-07-07: qty 100

## 2016-07-07 MED ORDER — CEFTRIAXONE SODIUM 250 MG IJ SOLR
250.0000 mg | Freq: Once | INTRAMUSCULAR | Status: AC
  Administered 2016-07-07: 250 mg via INTRAMUSCULAR
  Filled 2016-07-07: qty 250

## 2016-07-07 MED ORDER — SODIUM CHLORIDE 0.9 % IV BOLUS (SEPSIS)
1000.0000 mL | Freq: Once | INTRAVENOUS | Status: AC
  Administered 2016-07-07: 1000 mL via INTRAVENOUS

## 2016-07-07 MED ORDER — HYDROMORPHONE HCL 2 MG/ML IJ SOLN
0.5000 mg | Freq: Once | INTRAMUSCULAR | Status: AC
Start: 2016-07-07 — End: 2016-07-07
  Administered 2016-07-07: 0.5 mg via INTRAVENOUS
  Filled 2016-07-07: qty 1

## 2016-07-07 MED ORDER — SODIUM CHLORIDE 0.9 % IV BOLUS (SEPSIS): 1000.0000 mL | Freq: Once | INTRAVENOUS | Status: DC

## 2016-07-07 MED ORDER — AZITHROMYCIN 250 MG PO TABS
1000.0000 mg | ORAL_TABLET | Freq: Once | ORAL | Status: AC
  Administered 2016-07-07: 1000 mg via ORAL
  Filled 2016-07-07: qty 4

## 2016-07-07 MED ORDER — IOPAMIDOL (ISOVUE-300) INJECTION 61%: 100.0000 mL | Freq: Once | INTRAVENOUS | Status: DC | PRN

## 2016-07-07 NOTE — ED Notes (Signed)
Patient signed out AMA refusing further treatment. Patient ambulatory out of department at this time with S/O

## 2016-07-07 NOTE — ED Notes (Signed)
Patient requesting soda and snacks at this time. Provider aware, NPO until test results

## 2016-07-07 NOTE — ED Notes (Signed)
Patient given "ok" for PO fluid. Ginger ale provided at patient request

## 2016-07-07 NOTE — ED Triage Notes (Signed)
Per PTAR, patient is complaining of emesis x3 days. Patient states she has been unable to keep food/fluids down.

## 2016-07-07 NOTE — ED Notes (Signed)
Patient out of department at this time

## 2016-07-07 NOTE — ED Provider Notes (Signed)
[facility]-EMERGENCY DEPT Provider Note   CSN: [id number] Arrival date & time: 07/07/16  4098  By signing my name below, I, Valentino Saxon, attest that this documentation has been prepared under the direction and in the presence of Arvilla Meres PA-C Electronically Signed: Valentino Saxon, ED Scribe. 07/07/16. 9:37 PM.  History   Chief Complaint Chief Complaint  Patient presents with  . Emesis   The history is provided by the patient. No language interpreter was used.   HPI Comments: Angela Christian is a 20 y.o. female who presents to the Emergency Department complaining of moderate, episodic vomiting onset two days ago. Pt reports associated light-headedness, nausea, abdominal pain and consitpation. She notes her vomit is a "green, pinkish-yellowish" color. No frank blood report. Per pt, her last bowel movement was two days ago. Pt also notes having a dull, aching, abdominal pain that occurs when she is sitting. She also reports vaginal discharge. No alleviating factors noted. She is sexually active with two female partners in the last 6 months, no barrier protection used. She requests STD testing and treatment. Pt denies recent travel outside the [place]. She also denies syncope, fever, dysuria, hematuria, trouble swallowing, CP, SOB, blurred or double vision. Pt denies illicit drug use.   Past Medical History:  Diagnosis Date  . Anxiety   . Cyclical vomiting syndrome   . Schizophrenia [facility])     Patient Active Problem List   Diagnosis Date Noted  . MDD (major depressive disorder) 09/10/2015  . Major depressive disorder, recurrent episode (HCC) 09/10/2015  . Polysubstance abuse 09/10/2015  . Unspecified mood (affective) disorder (HCC) 09/10/2015  . Secondary amenorrhea 02/27/2015  . Cyclical vomiting syndrome 02/10/2015    Past Surgical History:  Procedure Laterality Date  . TONSILLECTOMY      OB History    Gravida Para Term Preterm AB Living   0 0 0 0 0 0   SAB TAB Ectopic  Multiple Live Births   0 0 0 0         Home Medications    Prior to Admission medications   Medication Sig Start Date End Date Taking? Authorizing Provider  bismuth subsalicylate (PEPTO BISMOL) 262 MG/15ML suspension Take 30 mLs by mouth every 6 (six) hours as needed for indigestion or diarrhea or loose stools.   Yes Historical Provider, MD  cephALEXin (KEFLEX) 500 MG capsule Take 1 capsule (500 mg total) by mouth 4 (four) times daily. Patient not taking: Reported on 04/28/2016 04/06/16   Geoffery Lyons, MD  HYDROcodone-acetaminophen (NORCO) 5-325 MG tablet Take 1-2 tablets by mouth every 6 (six) hours as needed. Patient not taking: Reported on 04/28/2016 04/06/16   Geoffery Lyons, MD  metoCLOPramide (REGLAN) 10 MG tablet Take 1 tablet (10 mg total) by mouth every 6 (six) hours. Patient not taking: Reported on 07/07/2016 04/28/16   [facility] Ward, PA-C  ondansetron (ZOFRAN ODT) 8 MG disintegrating tablet 8mg  ODT q4 hours prn nausea Patient not taking: Reported on 07/07/2016 04/06/16   Geoffery Lyons, MD  promethazine (PHENERGAN) 25 MG suppository Place 1 suppository (25 mg total) rectally every 6 (six) hours as needed for nausea or vomiting. Patient not taking: Reported on 07/07/2016 04/13/16   Ace Gins Sam, PA-C  promethazine (PHENERGAN) 25 MG tablet Take 1 tablet (25 mg total) by mouth every 6 (six) hours as needed for nausea or vomiting. Patient not taking: Reported on 07/07/2016 04/10/16   Benjiman Core, MD    Family History Family History  Problem Relation Age  of Onset  . Hypertension Mother     Social History Social History  Substance Use Topics  . Smoking status: Current Every Day Smoker    Packs/day: 0.50  . Smokeless tobacco: Never Used  . Alcohol use Yes     Comment: occ     Allergies   Patient has no known allergies.   Review of Systems Review of Systems  Constitutional: Negative for fever.  HENT: Negative for trouble swallowing.   Eyes: Negative for visual  disturbance.  Respiratory: Negative for shortness of breath.   Cardiovascular: Negative for chest pain.  Gastrointestinal: Positive for abdominal pain, constipation, nausea and vomiting.  Genitourinary: Positive for vaginal discharge. Negative for dysuria, hematuria, pelvic pain, vaginal bleeding and vaginal pain.  Musculoskeletal: Negative for myalgias.  Allergic/Immunologic: Negative for immunocompromised state.  Neurological: Positive for light-headedness. Negative for syncope.     Physical Exam Updated Vital Signs BP 119/63 (BP Location: Left Arm)   Pulse 62   Temp 98.2 F (36.8 C) (Oral)   Resp 18   Ht 5\' 4"  (1.626 m)   Wt 54.9 kg   SpO2 100%   BMI 20.77 kg/m   Physical Exam  Constitutional: She appears well-developed and well-nourished. No distress.  HENT:  Head: Normocephalic and atraumatic.  Mouth/Throat: Oropharynx is clear and moist. No oropharyngeal exudate.  Eyes: Conjunctivae and EOM are normal. Pupils are equal, round, and reactive to light. Right eye exhibits no discharge. Left eye exhibits no discharge. No scleral icterus.  Neck: Normal range of motion. Neck supple. No JVD present. No thyromegaly present.  Cardiovascular: Normal rate, regular rhythm, normal heart sounds and intact distal pulses.  Exam reveals no gallop and no friction rub.   No murmur heard. Pulmonary/Chest: Effort normal and breath sounds normal. No respiratory distress. She has no wheezes. She has no rales.  Abdominal: Soft. Bowel sounds are normal. She exhibits no distension and no mass. There is tenderness in the left lower quadrant. There is guarding.  Nontender.   Genitourinary: Vagina normal. Pelvic exam was performed with patient supine. There is no rash, tenderness, lesion or injury on the right labia. There is no rash, tenderness, lesion or injury on the left labia. Cervix exhibits discharge. Cervix exhibits no motion tenderness. Right adnexum displays no mass and no tenderness. Left  adnexum displays no mass and no tenderness.  Genitourinary Comments: Chaperone present for duration of exam. External anatomy normal - no injury, lesions, masses, or rashes. No bleeding, lesions, masses, or ulcerations in vaginal cavity. Cervix is closed with white discharge. No friability. No CMT, no adnexal tenderness, no masses palpated on bimanual exam.   Musculoskeletal: Normal range of motion. She exhibits no edema or tenderness.  Lymphadenopathy:    She has no cervical adenopathy.  Neurological: She is alert. Coordination normal.  Skin: Skin is warm and dry. No rash noted. No erythema.  Psychiatric: She has a normal mood and affect. Her behavior is normal.  Nursing note and vitals reviewed.    ED Treatments / Results   DIAGNOSTIC STUDIES: Oxygen Saturation is 100% on RA, normal by my interpretation.    COORDINATION OF CARE: 9:33 PM Discussed treatment plan with pt at bedside which includes labs and nausea medication and pt agreed to plan.  Labs (all labs ordered are listed, but only abnormal results are displayed) Labs Reviewed  WET PREP, GENITAL - Abnormal; Notable for the following:       Result Value   WBC, Wet Prep HPF POC MANY (*)  All other components within normal limits  COMPREHENSIVE METABOLIC PANEL - Abnormal; Notable for the following:    Chloride 98 (*)    Glucose, Bld 101 (*)    Total Bilirubin 1.4 (*)    All other components within normal limits  CBC - Abnormal; Notable for the following:    WBC 12.4 (*)    Hemoglobin 16.7 (*)    HCT 46.6 (*)    MCH 35.8 (*)    All other components within normal limits  URINALYSIS, ROUTINE W REFLEX MICROSCOPIC - Abnormal; Notable for the following:    Color, Urine AMBER (*)    APPearance HAZY (*)    Specific Gravity, Urine 1.035 (*)    Hgb urine dipstick MODERATE (*)    Bilirubin Urine SMALL (*)    Ketones, ur 80 (*)    Protein, ur 100 (*)    Leukocytes, UA TRACE (*)    Squamous Epithelial / LPF 6-30 (*)    All  other components within normal limits  LIPASE, BLOOD  I-STAT BETA HCG BLOOD, ED ([facility], [facility], [facility] ONLY)  GC/CHLAMYDIA PROBE AMP ([place]) NOT AT [facility]    EKG  EKG Interpretation None       Radiology No results found.  Procedures Procedures (including critical care time)  Medications Ordered in ED Medications  sodium chloride 0.9 % bolus 1,000 mL (1,000 mLs Intravenous Not Given 07/07/16 2309)  iopamidol (ISOVUE-300) 61 % injection 100 mL (not administered)  iopamidol (ISOVUE-300) 61 % injection (not administered)  ondansetron (ZOFRAN-ODT) disintegrating tablet 4 mg (4 mg Oral Given 07/07/16 1905)  sodium chloride 0.9 % bolus 1,000 mL (0 mLs Intravenous Stopped 07/07/16 2301)  metoCLOPramide (REGLAN) injection 10 mg (10 mg Intravenous Given 07/07/16 2152)  HYDROmorphone (DILAUDID) injection 0.5 mg (0.5 mg Intravenous Given 07/07/16 2152)  cefTRIAXone (ROCEPHIN) injection 250 mg (250 mg Intramuscular Given 07/07/16 2308)  azithromycin (ZITHROMAX) tablet 1,000 mg (1,000 mg Oral Given 07/07/16 2308)  sterile water (preservative free) injection (10 mLs  Given 07/07/16 2308)     Initial Impression / Assessment and Plan / ED Course  I have reviewed the triage vital signs and the nursing notes.  Pertinent labs & imaging results that were available during my care of the patient were reviewed by me and considered in my medical decision making (see chart for details).  Clinical Course     Patient presents to ED with complaint of N/V and intermittent abdominal pain onset monday. Patient is afebrile and non-toxic appearing in NAD. VSS. Heart RRR. Lungs CTABL. TTP in LLQ with mild guarding. White discharge noted on pelvic exam, not CMT, no adnexal tenderness or masses appreciated. IVF, anti-emetics, and pain medication given in ED. Will check labs.   Pregnancy test negative - doubt sxs secondary to pregnancy or ectopic. Mildly elevated WBC. Lipase nml - doubt pancreatitis. Mildly elevated  bilirubin; however, review of records show elevation in past, LFTs nml, no RUQ tenderness, low suspicion for acute cholecystitis at this time. No TTP in RLQ, afebrile - low suspicion for appendicitis. Wet prep remarkable for WBC. U/A remarkable for dehydration - IVF initiated; no overt UTI, trace leukocytes, many squamous present, no dysuria or hematuria. IM ceftriaxone and PO azithromycin given for empiric treatment of STI. CT abdomen/pelvis ordered to further assess given LLQ TTP with elevated WBC.   Notified by nursing staff patient felt better, tolerated fluids, and left AMA.  Final Clinical Impressions(s) / ED Diagnoses   Final diagnoses:  Left lower quadrant pain  Non-intractable vomiting with nausea, unspecified vomiting type    New Prescriptions Discharge Medication List as of 07/07/2016 11:21 PM     I personally performed the services described in this documentation, which was scribed in my presence. The recorded information has been reviewed and is accurate.     Lona KettleAshley Laurel Meyer, PA-C 07/08/16 0000    Melene Planan Floyd, DO 07/08/16 [phone]

## 2016-07-08 ENCOUNTER — Encounter (HOSPITAL_COMMUNITY): Payer: Self-pay | Admitting: Emergency Medicine

## 2016-07-08 ENCOUNTER — Emergency Department (HOSPITAL_COMMUNITY)
Admission: EM | Admit: 2016-07-08 | Discharge: 2016-07-09 | Disposition: A | Discharge status: Home / Self Care | Payer: Self-pay | Attending: Emergency Medicine | Admitting: Emergency Medicine

## 2016-07-08 ENCOUNTER — Emergency Department (HOSPITAL_COMMUNITY): Payer: Self-pay

## 2016-07-08 ENCOUNTER — Ambulatory Visit (HOSPITAL_COMMUNITY): Admission: RE | Admit: 2016-07-08 | Payer: Medicaid Other | Source: Ambulatory Visit

## 2016-07-08 DIAGNOSIS — R1031 Right lower quadrant pain: Secondary | ICD-10-CM

## 2016-07-08 DIAGNOSIS — K529 Noninfective gastroenteritis and colitis, unspecified: Secondary | ICD-10-CM

## 2016-07-08 DIAGNOSIS — F17200 Nicotine dependence, unspecified, uncomplicated: Secondary | ICD-10-CM | POA: Insufficient documentation

## 2016-07-08 DIAGNOSIS — R112 Nausea with vomiting, unspecified: Secondary | ICD-10-CM

## 2016-07-08 DIAGNOSIS — R197 Diarrhea, unspecified: Secondary | ICD-10-CM

## 2016-07-08 LAB — COMPREHENSIVE METABOLIC PANEL
ALT: 22 U/L (ref 14–54)
AST: 26 U/L (ref 15–41)
Albumin: 4.6 g/dL (ref 3.5–5.0)
Alkaline Phosphatase: 55 U/L (ref 38–126)
Anion gap: 13 (ref 5–15)
BUN: 16 mg/dL (ref 6–20)
CHLORIDE: 101 mmol/L (ref 101–111)
CO2: 23 mmol/L (ref 22–32)
Calcium: 9.5 mg/dL (ref 8.9–10.3)
Creatinine, Ser: 0.84 mg/dL (ref 0.44–1.00)
Glucose, Bld: 89 mg/dL (ref 65–99)
POTASSIUM: 3.2 mmol/L — AB (ref 3.5–5.1)
Sodium: 137 mmol/L (ref 135–145)
Total Bilirubin: 1.9 mg/dL — ABNORMAL HIGH (ref 0.3–1.2)
Total Protein: 7.8 g/dL (ref 6.5–8.1)

## 2016-07-08 LAB — CBC
HEMATOCRIT: 45.5 % (ref 36.0–46.0)
Hemoglobin: 16.6 g/dL — ABNORMAL HIGH (ref 12.0–15.0)
MCH: 36.8 pg — ABNORMAL HIGH (ref 26.0–34.0)
MCHC: 36.5 g/dL — ABNORMAL HIGH (ref 30.0–36.0)
MCV: 100.9 fL — AB (ref 78.0–100.0)
Platelets: 386 10*3/uL (ref 150–400)
RBC: 4.51 MIL/uL (ref 3.87–5.11)
RDW: 12.1 % (ref 11.5–15.5)
WBC: 11.3 10*3/uL — AB (ref 4.0–10.5)

## 2016-07-08 LAB — GC/CHLAMYDIA PROBE AMP (~~LOC~~) NOT AT ARMC
CHLAMYDIA, DNA PROBE: NEGATIVE
NEISSERIA GONORRHEA: NEGATIVE

## 2016-07-08 LAB — URINALYSIS, ROUTINE W REFLEX MICROSCOPIC
BACTERIA UA: NONE SEEN
BILIRUBIN URINE: NEGATIVE
Glucose, UA: NEGATIVE mg/dL
KETONES UR: 80 mg/dL — AB
Nitrite: NEGATIVE
Protein, ur: 100 mg/dL — AB
Specific Gravity, Urine: 1.032 — ABNORMAL HIGH (ref 1.005–1.030)
pH: 6 (ref 5.0–8.0)

## 2016-07-08 LAB — I-STAT BETA HCG BLOOD, ED (MC, WL, AP ONLY)

## 2016-07-08 LAB — LIPASE, BLOOD: LIPASE: 18 U/L (ref 11–51)

## 2016-07-08 LAB — I-STAT CG4 LACTIC ACID, ED: Lactic Acid, Venous: 1.49 mmol/L (ref 0.5–1.9)

## 2016-07-08 MED ORDER — IOPAMIDOL (ISOVUE-300) INJECTION 61%
100.0000 mL | Freq: Once | INTRAVENOUS | Status: AC | PRN
  Administered 2016-07-08: 100 mL via INTRAVENOUS

## 2016-07-08 MED ORDER — IOPAMIDOL (ISOVUE-300) INJECTION 61%: 30.0000 mL | Freq: Once | INTRAVENOUS | Status: DC | PRN

## 2016-07-08 MED ORDER — HYDROMORPHONE HCL 2 MG/ML IJ SOLN
1.0000 mg | Freq: Once | INTRAMUSCULAR | Status: AC
  Administered 2016-07-08: 1 mg via INTRAVENOUS
  Filled 2016-07-08: qty 1

## 2016-07-08 MED ORDER — METOCLOPRAMIDE HCL 5 MG/ML IJ SOLN
10.0000 mg | Freq: Once | INTRAMUSCULAR | Status: AC
  Administered 2016-07-08: 10 mg via INTRAVENOUS
  Filled 2016-07-08: qty 2

## 2016-07-08 MED ORDER — SODIUM CHLORIDE 0.9 % IV BOLUS (SEPSIS)
1000.0000 mL | Freq: Once | INTRAVENOUS | Status: AC
  Administered 2016-07-08: 1000 mL via INTRAVENOUS

## 2016-07-08 MED ORDER — POTASSIUM CHLORIDE CRYS ER 20 MEQ PO TBCR
40.0000 meq | EXTENDED_RELEASE_TABLET | Freq: Once | ORAL | Status: AC
  Administered 2016-07-08: 40 meq via ORAL
  Filled 2016-07-08: qty 2

## 2016-07-08 MED ORDER — IOPAMIDOL (ISOVUE-300) INJECTION 61%
INTRAVENOUS | Status: AC
  Administered 2016-07-08: 23:00:00
  Filled 2016-07-08: qty 30

## 2016-07-08 MED ORDER — ONDANSETRON HCL 4 MG/2ML IJ SOLN
4.0000 mg | Freq: Once | INTRAMUSCULAR | Status: AC | PRN
  Administered 2016-07-08: 4 mg via INTRAVENOUS
  Filled 2016-07-08: qty 2

## 2016-07-08 MED ORDER — IOPAMIDOL (ISOVUE-300) INJECTION 61%
INTRAVENOUS | Status: AC
  Administered 2016-07-08: 23:00:00
  Filled 2016-07-08: qty 75

## 2016-07-08 NOTE — ED Triage Notes (Signed)
Patient here from home with complaints of nausea, vomiting, diarrhea. Reports that she was here for the same yesterday, given medication with no relief.

## 2016-07-08 NOTE — ED Provider Notes (Signed)
[facility]-EMERGENCY DEPT Provider Note   CSN: [id number] Arrival date & time: 07/08/16  1820     History   Chief Complaint Chief Complaint  Patient presents with  . Nausea  . Emesis  . Diarrhea    HPI Angela Christian is a 20 y.o. female With a past medical history significant for polysubstance abuse, cyclical vomiting syndrome, depression, and schizophrenia who was recently seen at this facility yesterday for abdominal pain who presents for continued abdominal pain, nausea, vomiting, and diarrhea. Patient reports that her pain is now in her right lower quadrant. Patient describes the pain as 10 out of 10 and associated with nausea, vomiting, and diarrhea. She denies any blood in her Emesis or diarrhea. Chart review shows that patient had a pelvic exam performed yesterday and was treated empirically for STI with discharge. Patient ended up leaving AMA for getting a CT scan to look for cause abdominal pain. Patient returns today to continue her workup given her continued symptoms.  Patient denies fevers, chills, chest pain, shortness of breath. She denies any dysuria or changes in urination. She denies any further vaginal bleeding or vaginal discharge. Her primary concern is the right lower quadrant abdominal pain. Patient does have her appendix.    The history is provided by the patient, a significant other and medical records. No language interpreter was used.  Abdominal Pain   This is a new problem. The current episode started more than 2 days ago. The problem occurs constantly. The problem has not changed since onset.The pain is located in the RLQ. The quality of the pain is aching, sharp and cramping. The pain is at a severity of 10/10. The pain is severe. Associated symptoms include anorexia, diarrhea, nausea and vomiting. Pertinent negatives include fever, hematochezia, melena, constipation, dysuria, frequency, hematuria and headaches. The symptoms are aggravated by palpation. Nothing  relieves the symptoms.    Past Medical History:  Diagnosis Date  . Anxiety   . Cyclical vomiting syndrome   . Schizophrenia [facility])     Patient Active Problem List   Diagnosis Date Noted  . MDD (major depressive disorder) 09/10/2015  . Major depressive disorder, recurrent episode (HCC) 09/10/2015  . Polysubstance abuse 09/10/2015  . Unspecified mood (affective) disorder (HCC) 09/10/2015  . Secondary amenorrhea 02/27/2015  . Cyclical vomiting syndrome 02/10/2015    Past Surgical History:  Procedure Laterality Date  . TONSILLECTOMY      OB History    Gravida Para Term Preterm AB Living   0 0 0 0 0 0   SAB TAB Ectopic Multiple Live Births   0 0 0 0         Home Medications    Prior to Admission medications   Medication Sig Start Date End Date Taking? Authorizing Provider  bismuth subsalicylate (PEPTO BISMOL) 262 MG/15ML suspension Take 30 mLs by mouth every 6 (six) hours as needed for indigestion or diarrhea or loose stools.    Historical Provider, MD  cephALEXin (KEFLEX) 500 MG capsule Take 1 capsule (500 mg total) by mouth 4 (four) times daily. Patient not taking: Reported on 04/28/2016 04/06/16   Geoffery Lyons, MD  HYDROcodone-acetaminophen (NORCO) 5-325 MG tablet Take 1-2 tablets by mouth every 6 (six) hours as needed. Patient not taking: Reported on 04/28/2016 04/06/16   Geoffery Lyons, MD  metoCLOPramide (REGLAN) 10 MG tablet Take 1 tablet (10 mg total) by mouth every 6 (six) hours. Patient not taking: Reported on 07/07/2016 04/28/16   Chase Picket Ward, PA-C  ondansetron (ZOFRAN ODT) 8 MG disintegrating tablet 8mg  ODT q4 hours prn nausea Patient not taking: Reported on 07/07/2016 04/06/16   Geoffery Lyons, MD  promethazine (PHENERGAN) 25 MG suppository Place 1 suppository (25 mg total) rectally every 6 (six) hours as needed for nausea or vomiting. Patient not taking: Reported on 07/07/2016 04/13/16   Ace Gins Sam, PA-C  promethazine (PHENERGAN) 25 MG tablet Take 1 tablet  (25 mg total) by mouth every 6 (six) hours as needed for nausea or vomiting. Patient not taking: Reported on 07/07/2016 04/10/16   Benjiman Core, MD    Family History Family History  Problem Relation Age of Onset  . Hypertension Mother     Social History Social History  Substance Use Topics  . Smoking status: Current Every Day Smoker    Packs/day: 0.50  . Smokeless tobacco: Never Used  . Alcohol use Yes     Comment: occ     Allergies   Patient has no known allergies.   Review of Systems Review of Systems  Constitutional: Negative for chills, diaphoresis, fatigue and fever.  HENT: Negative for congestion.   Eyes: Negative for visual disturbance.  Respiratory: Negative for cough, chest tightness, shortness of breath, wheezing and stridor.   Cardiovascular: Negative for chest pain and palpitations.  Gastrointestinal: Positive for abdominal pain, anorexia, diarrhea, nausea and vomiting. Negative for abdominal distention, blood in stool, constipation, hematochezia and melena.  Genitourinary: Negative for decreased urine volume, dysuria, flank pain, frequency, hematuria, pelvic pain, vaginal bleeding and vaginal pain.  Musculoskeletal: Negative for back pain, neck pain and neck stiffness.  Skin: Negative for rash and wound.  Neurological: Negative for light-headedness and headaches.  All other systems reviewed and are negative.    Physical Exam Updated Vital Signs BP 162/98 (BP Location: Left Arm)   Pulse 60   Temp 98.3 F (36.8 C) (Oral)   Resp 18   Wt 115 lb (52.2 kg)   SpO2 100%   BMI 19.74 kg/m   Physical Exam  Constitutional: She appears well-developed and well-nourished. No distress.  HENT:  Head: Normocephalic and atraumatic.  Mouth/Throat: Oropharynx is clear and moist. No oropharyngeal exudate.  Eyes: Conjunctivae and EOM are normal. Pupils are equal, round, and reactive to light.  Neck: Neck supple.  Cardiovascular: Normal rate, regular rhythm and  normal heart sounds.   No murmur heard. Pulmonary/Chest: Effort normal and breath sounds normal. No stridor. No respiratory distress. She has no wheezes. She has no rales. She exhibits no tenderness.  Abdominal: Soft. Normal appearance and bowel sounds are normal. There is tenderness in the right lower quadrant. There is no rigidity, no rebound, no guarding and no CVA tenderness.    Musculoskeletal: She exhibits no edema.  Neurological: She is alert. No sensory deficit. She exhibits normal muscle tone.  Skin: Skin is warm and dry. Capillary refill takes less than 2 seconds. No erythema.  Psychiatric: She has a normal mood and affect.  Nursing note and vitals reviewed.    ED Treatments / Results  Labs (all labs ordered are listed, but only abnormal results are displayed) Labs Reviewed  COMPREHENSIVE METABOLIC PANEL - Abnormal; Notable for the following:       Result Value   Potassium 3.2 (*)    Total Bilirubin 1.9 (*)    All other components within normal limits  CBC - Abnormal; Notable for the following:    WBC 11.3 (*)    Hemoglobin 16.6 (*)    MCV 100.9 (*)  MCH 36.8 (*)    MCHC 36.5 (*)    All other components within normal limits  URINALYSIS, ROUTINE W REFLEX MICROSCOPIC - Abnormal; Notable for the following:    APPearance HAZY (*)    Specific Gravity, Urine 1.032 (*)    Hgb urine dipstick LARGE (*)    Ketones, ur 80 (*)    Protein, ur 100 (*)    Leukocytes, UA TRACE (*)    Squamous Epithelial / LPF 0-5 (*)    All other components within normal limits  LIPASE, BLOOD  I-STAT BETA HCG BLOOD, ED ([facility], [facility], [facility] ONLY)  I-STAT CG4 LACTIC ACID, ED    EKG  EKG Interpretation None       Radiology Ct Abdomen Pelvis W Contrast  Result Date: 07/08/2016 CLINICAL DATA:  Nausea vomiting and diarrhea EXAM: CT ABDOMEN AND PELVIS WITH CONTRAST TECHNIQUE: Multidetector CT imaging of the abdomen and pelvis was performed using the standard protocol following bolus administration  of intravenous contrast. CONTRAST:  100mL ISOVUE-300 IOPAMIDOL (ISOVUE-300) INJECTION 61% COMPARISON:  04/06/2016 FINDINGS: Lower chest: Lung bases demonstrate no acute consolidation or pleural effusion. Normal heart size. Hepatobiliary: Mild focal fatty infiltration near the falciform ligament. No focal hepatic abnormality. No calcified gallstones. No biliary dilatation. Pancreas: No peripancreatic inflammation. No focal fluid collections. Pancreas duct is nonenlarged. Spleen: Normal in size without focal abnormality. Adrenals/Urinary Tract: Adrenal glands are unremarkable. Kidneys are normal, without renal calculi, focal lesion, or hydronephrosis. Bladder is unremarkable. Stomach/Bowel: Appendix is visualized and is normal. Focal wall thickening involving jejunal small bowel loops in the left upper quadrant, series 2, image number 35 focal masslike soft tissue opacity between the stomach and pancreas, suspected to represent an opacified thickened bowel loops. Vascular/Lymphatic: No significant vascular findings are present. No enlarged abdominal or pelvic lymph nodes. Reproductive: Uterus grossly unremarkable. 3.3 cm hypodense right adnexal lesion Other: Small free fluid in the pelvis.  No free air. Musculoskeletal: No acute or significant osseous findings. IMPRESSION: 1. Thickened jejunal small bowel loops in the left upper quadrant, could relate to an infectious enteritis or possible inflammatory bowel disease. A masslike soft tissue opacity between the stomach and tail of pancreas is suspected to represent thickened on opacified bowel. 2. Normal appendix.  No evidence for a bowel obstruction. 3. 3.3 cm right ovarian hypodense mass. This is probably benign, but recommend ultrasound follow-up in 6-12 weeks. 4. Small amount of free fluid in the pelvis. Electronically Signed   By: Jasmine PangKim  Fujinaga M.D.   On: 07/08/2016 23:34    Procedures Procedures (including critical care time)  Medications Ordered in  ED Medications  ondansetron (ZOFRAN) injection 4 mg (4 mg Intravenous Given 07/08/16 2020)  HYDROmorphone (DILAUDID) injection 1 mg (1 mg Intravenous Given 07/08/16 2201)  sodium chloride 0.9 % bolus 1,000 mL (0 mLs Intravenous Stopped 07/08/16 2323)  sodium chloride 0.9 % bolus 1,000 mL (0 mLs Intravenous Stopped 07/08/16 2323)  potassium chloride SA (K-DUR,KLOR-CON) CR tablet 40 mEq (40 mEq Oral Given 07/08/16 2201)  metoCLOPramide (REGLAN) injection 10 mg (10 mg Intravenous Given 07/08/16 2159)  iopamidol (ISOVUE-300) 61 % injection (  Contrast Given 07/08/16 2322)  iopamidol (ISOVUE-300) 61 % injection (  Contrast Given 07/08/16 2322)  iopamidol (ISOVUE-300) 61 % injection 100 mL (100 mLs Intravenous Contrast Given 07/08/16 2304)  oxyCODONE-acetaminophen (PERCOCET/ROXICET) 5-325 MG per tablet 1 tablet (1 tablet Oral Given 07/09/16 0057)  ondansetron (ZOFRAN) injection 4 mg (4 mg Intravenous Given 07/09/16 0057)     Initial Impression /  Assessment and Plan / ED Course  I have reviewed the triage vital signs and the nursing notes.  Pertinent labs & imaging results that were available during my care of the patient were reviewed by me and considered in my medical decision making (see chart for details).  Clinical Course    Angela Christian is a 20 y.o. female With a past medical history significant for polysubstance abuse, cyclical vomiting syndrome, depression, and schizophrenia who was recently seen at this facility yesterday for abdominal pain who presents for continued abdominal pain, nausea, vomiting, and diarrhea.  History and exam are seen above. On exam, patient had right lower quadrant tenderness. Patient had no guarding or rebound tenderness. Patient had no CVA tenderness or back tenderness. Lungs are clear. Physical exam is otherwise unremarkable.  Patient had laboratory testing showing continued leukocytosis. Given right lower quadrant tenderness, CT order to look for acute  appendicitis. Patient given pain medicine, nausea medicine and fluids during her workup.  Patient reported improvement in her pain and nausea with medications.  Laboratory testing showed decreased potassium, this was supplemented orally. Pregnancy test negative, lipase negative, lactic acid nonelevated. Hemoglobin is not low but there is persistent leukocytosis. Urinalysis showed no nitrites but trace leukocytes And squamous cells. No bacteria. In setting of no urinary symptoms, do not feel patient has UTI at this time.  CT scan shows no appendicitis but the show evidence of enteritis with some inflammatory changes. There is also evidence of an Ovarian hypo-density. Patient was informed of these findings and instructed to follow up with OB/GYN for further ultrasound of ovary.   Given patient symptoms and CT findings, suspect a gastroenteritis causing her symptoms. Although likely borrow, patient will be given antibiotics given CT imaging findings.  Patient given prescription for antibiotics, nausea medicine, and pain medicine. Patient instructed to follow up with a PCP as well as very strict return precautions for new or worsening symptoms. Patient agreed a plan of care and was discharged in good condition.      Final Clinical Impressions(s) / ED Diagnoses   Final diagnoses:  Enteritis  Nausea vomiting and diarrhea  Right lower quadrant abdominal pain    New Prescriptions Discharge Medication List as of 07/09/2016 12:59 AM    START taking these medications   Details  ciprofloxacin (CIPRO) 500 MG tablet Take 1 tablet (500 mg total) by mouth 2 (two) times daily., Starting Fri 07/09/2016, Until Fri 07/16/2016, Print    metroNIDAZOLE (FLAGYL) 500 MG tablet Take 1 tablet (500 mg total) by mouth 3 (three) times daily., Starting Fri 07/09/2016, Until Fri 07/16/2016, Print    ondansetron (ZOFRAN) 4 MG tablet Take 1 tablet (4 mg total) by mouth every 8 (eight) hours as needed for nausea or  vomiting., Starting Fri 07/09/2016, Print    oxyCODONE-acetaminophen (PERCOCET/ROXICET) 5-325 MG tablet Take 2 tablets by mouth every 4 (four) hours as needed for severe pain., Starting Fri 07/09/2016, Print        Clinical Impression: 1. Enteritis   2. Nausea vomiting and diarrhea   3. Right lower quadrant abdominal pain     Disposition: Discharge  Condition: Good  I have discussed the results, Dx and Tx plan with the pt(& family if present). He/she/they expressed understanding and agree(s) with the plan. Discharge instructions discussed at great length. Strict return precautions discussed and pt &/or family have verbalized understanding of the instructions. No further questions at time of discharge.    Discharge Medication List as of 07/09/2016  12:59 AM    START taking these medications   Details  ciprofloxacin (CIPRO) 500 MG tablet Take 1 tablet (500 mg total) by mouth 2 (two) times daily., Starting Fri 07/09/2016, Until Fri 07/16/2016, Print    metroNIDAZOLE (FLAGYL) 500 MG tablet Take 1 tablet (500 mg total) by mouth 3 (three) times daily., Starting Fri 07/09/2016, Until Fri 07/16/2016, Print    ondansetron (ZOFRAN) 4 MG tablet Take 1 tablet (4 mg total) by mouth every 8 (eight) hours as needed for nausea or vomiting., Starting Fri 07/09/2016, Print    oxyCODONE-acetaminophen (PERCOCET/ROXICET) 5-325 MG tablet Take 2 tablets by mouth every 4 (four) hours as needed for severe pain., Starting Fri 07/09/2016, Print        Follow Up: [facility] [address] [place] [address] [phone] Schedule an appointment as soon as possible for a visit    [facility] DE[facility] [id number] [address] [place] [address] [phone]  If symptoms worsen     Heide Scales, MD 07/09/16 1102

## 2016-07-09 MED ORDER — OXYCODONE-ACETAMINOPHEN 5-325 MG PO TABS: 2.0000 | ORAL_TABLET | ORAL | 0 refills | Status: DC | PRN

## 2016-07-09 MED ORDER — ONDANSETRON HCL 4 MG PO TABS
4.0000 mg | ORAL_TABLET | Freq: Three times a day (TID) | ORAL | 0 refills | Status: DC | PRN
Start: 2016-07-09 — End: 2016-07-30

## 2016-07-09 MED ORDER — CIPROFLOXACIN HCL 500 MG PO TABS: 500.0000 mg | ORAL_TABLET | Freq: Two times a day (BID) | ORAL | 0 refills | Status: AC

## 2016-07-09 MED ORDER — ONDANSETRON HCL 4 MG/2ML IJ SOLN
4.0000 mg | Freq: Once | INTRAMUSCULAR | Status: AC
  Administered 2016-07-09: 4 mg via INTRAVENOUS
  Filled 2016-07-09: qty 2

## 2016-07-09 MED ORDER — OXYCODONE-ACETAMINOPHEN 5-325 MG PO TABS
1.0000 | ORAL_TABLET | Freq: Once | ORAL | Status: AC
  Administered 2016-07-09: 1 via ORAL
  Filled 2016-07-09: qty 1

## 2016-07-09 MED ORDER — METRONIDAZOLE 500 MG PO TABS: 500.0000 mg | ORAL_TABLET | Freq: Three times a day (TID) | ORAL | 0 refills | Status: AC

## 2016-07-09 NOTE — Discharge Instructions (Signed)
Please take your antibiotics as prescribed to help treat your gastroenteritis. Please take your pain medicine and nausea medicine as needed for your symptoms. Please schedule a follow-up with a primary care physician for further management of your symptoms. Please stay hydrated and drink lots of fluids. If any symptoms return or worsen, please return to the nearest emergency department.

## 2016-07-30 ENCOUNTER — Encounter (HOSPITAL_BASED_OUTPATIENT_CLINIC_OR_DEPARTMENT_OTHER): Payer: Self-pay | Admitting: *Deleted

## 2016-07-30 ENCOUNTER — Emergency Department (HOSPITAL_BASED_OUTPATIENT_CLINIC_OR_DEPARTMENT_OTHER)
Admission: EM | Admit: 2016-07-30 | Discharge: 2016-07-30 | Disposition: A | Discharge status: Home / Self Care | Payer: Medicaid Other | Attending: Emergency Medicine | Admitting: Emergency Medicine

## 2016-07-30 DIAGNOSIS — N760 Acute vaginitis: Secondary | ICD-10-CM | POA: Insufficient documentation

## 2016-07-30 DIAGNOSIS — R112 Nausea with vomiting, unspecified: Secondary | ICD-10-CM | POA: Insufficient documentation

## 2016-07-30 DIAGNOSIS — R197 Diarrhea, unspecified: Secondary | ICD-10-CM | POA: Insufficient documentation

## 2016-07-30 DIAGNOSIS — B9689 Other specified bacterial agents as the cause of diseases classified elsewhere: Secondary | ICD-10-CM

## 2016-07-30 DIAGNOSIS — F17200 Nicotine dependence, unspecified, uncomplicated: Secondary | ICD-10-CM | POA: Insufficient documentation

## 2016-07-30 LAB — CBC WITH DIFFERENTIAL/PLATELET
BASOS PCT: 0 %
Basophils Absolute: 0 10*3/uL (ref 0.0–0.1)
EOS PCT: 0 %
Eosinophils Absolute: 0 10*3/uL (ref 0.0–0.7)
HCT: 49.1 % — ABNORMAL HIGH (ref 36.0–46.0)
Hemoglobin: 16.8 g/dL — ABNORMAL HIGH (ref 12.0–15.0)
LYMPHS ABS: 0.9 10*3/uL (ref 0.7–4.0)
Lymphocytes Relative: 9 %
MCH: 34.9 pg — ABNORMAL HIGH (ref 26.0–34.0)
MCHC: 34.2 g/dL (ref 30.0–36.0)
MCV: 101.9 fL — ABNORMAL HIGH (ref 78.0–100.0)
MONOS PCT: 4 %
Monocytes Absolute: 0.4 10*3/uL (ref 0.1–1.0)
Neutro Abs: 8.9 10*3/uL — ABNORMAL HIGH (ref 1.7–7.7)
Neutrophils Relative %: 87 %
PLATELETS: 235 10*3/uL (ref 150–400)
RBC: 4.82 MIL/uL (ref 3.87–5.11)
RDW: 11.4 % — AB (ref 11.5–15.5)
WBC: 10.3 10*3/uL (ref 4.0–10.5)

## 2016-07-30 LAB — URINALYSIS, MICROSCOPIC (REFLEX)

## 2016-07-30 LAB — URINALYSIS, ROUTINE W REFLEX MICROSCOPIC
GLUCOSE, UA: NEGATIVE mg/dL
Nitrite: NEGATIVE
Protein, ur: 100 mg/dL — AB
Specific Gravity, Urine: 1.038 — ABNORMAL HIGH (ref 1.005–1.030)
pH: 6.5 (ref 5.0–8.0)

## 2016-07-30 LAB — COMPREHENSIVE METABOLIC PANEL
ALBUMIN: 5.1 g/dL — AB (ref 3.5–5.0)
ALT: 25 U/L (ref 14–54)
AST: 30 U/L (ref 15–41)
Alkaline Phosphatase: 53 U/L (ref 38–126)
Anion gap: 12 (ref 5–15)
BILIRUBIN TOTAL: 1.3 mg/dL — AB (ref 0.3–1.2)
BUN: 17 mg/dL (ref 6–20)
CHLORIDE: 97 mmol/L — AB (ref 101–111)
CO2: 26 mmol/L (ref 22–32)
Calcium: 10.2 mg/dL (ref 8.9–10.3)
Creatinine, Ser: 0.89 mg/dL (ref 0.44–1.00)
GFR calc Af Amer: >60 mL/min (ref >60)
GFR calc non Af Amer: >60 mL/min (ref >60)
GLUCOSE: 92 mg/dL (ref 65–99)
POTASSIUM: 3.3 mmol/L — AB (ref 3.5–5.1)
Sodium: 135 mmol/L (ref 135–145)
Total Protein: 8.4 g/dL — ABNORMAL HIGH (ref 6.5–8.1)

## 2016-07-30 LAB — WET PREP, GENITAL
Sperm: NONE SEEN
Trich, Wet Prep: NONE SEEN
Yeast Wet Prep HPF POC: NONE SEEN

## 2016-07-30 LAB — LIPASE, BLOOD: Lipase: 12 U/L (ref 11–51)

## 2016-07-30 LAB — PREGNANCY, URINE: PREG TEST UR: NEGATIVE

## 2016-07-30 MED ORDER — KETOROLAC TROMETHAMINE 30 MG/ML IJ SOLN
30.0000 mg | Freq: Once | INTRAMUSCULAR | Status: AC
  Administered 2016-07-30: 30 mg via INTRAVENOUS
  Filled 2016-07-30: qty 1

## 2016-07-30 MED ORDER — METOCLOPRAMIDE HCL 10 MG PO TABS: 10.0000 mg | ORAL_TABLET | Freq: Three times a day (TID) | ORAL | 0 refills | Status: DC | PRN

## 2016-07-30 MED ORDER — ONDANSETRON HCL 4 MG/2ML IJ SOLN: 4.0000 mg | Freq: Once | INTRAMUSCULAR | Status: DC

## 2016-07-30 MED ORDER — SODIUM CHLORIDE 0.9 % IV BOLUS (SEPSIS)
1000.0000 mL | Freq: Once | INTRAVENOUS | Status: AC
  Administered 2016-07-30: 1000 mL via INTRAVENOUS

## 2016-07-30 MED ORDER — PROMETHAZINE HCL 25 MG PO TABS
25.0000 mg | ORAL_TABLET | Freq: Once | ORAL | Status: AC
  Administered 2016-07-30: 25 mg via ORAL
  Filled 2016-07-30: qty 1

## 2016-07-30 MED ORDER — METOCLOPRAMIDE HCL 5 MG/ML IJ SOLN
10.0000 mg | Freq: Once | INTRAMUSCULAR | Status: AC
  Administered 2016-07-30: 10 mg via INTRAVENOUS
  Filled 2016-07-30: qty 2

## 2016-07-30 NOTE — ED Notes (Signed)
Pt states she wants to go home now she feels better. Will let MD aware.

## 2016-07-30 NOTE — ED Provider Notes (Signed)
[facility]-EMERGENCY DEPT Provider Note   CSN: [id number] Arrival date & time: 07/30/16 0957     History    Chief Complaint  Patient presents with  . Emesis     HPI Angela Christian is a 21 y.o. female.  21yo F w/ PMH including schizophrenia, depression, cyclical vomiting who p/w nausea, vomiting, diarrhea, and vaginal discharge. Patient states that she has had nausea, vomiting, and nonbloody diarrhea for the past 3 days. She denies any associated urinary symptoms but does endorse a few days of vaginal discharge. She is sexually active with one partner and does not use protection. She denies any cough/cold symptoms, fevers, or sick contacts. She also endorses pain in her right back. No recent alcohol use. She does admit to using marijuana several times weekly. She has not taken any medications for her symptoms.  Past Medical History:  Diagnosis Date  . Anxiety   . Cyclical vomiting syndrome   . Schizophrenia [facility])      Patient Active Problem List   Diagnosis Date Noted  . MDD (major depressive disorder) 09/10/2015  . Major depressive disorder, recurrent episode (HCC) 09/10/2015  . Polysubstance abuse 09/10/2015  . Unspecified mood (affective) disorder (HCC) 09/10/2015  . Secondary amenorrhea 02/27/2015  . Cyclical vomiting syndrome 02/10/2015    Past Surgical History:  Procedure Laterality Date  . TONSILLECTOMY      OB History    Gravida Para Term Preterm AB Living   0 0 0 0 0 0   SAB TAB Ectopic Multiple Live Births   0 0 0 0          Home Medications    Prior to Admission medications   Medication Sig Start Date End Date Taking? Authorizing Provider  metoCLOPramide (REGLAN) 10 MG tablet Take 1 tablet (10 mg total) by mouth 3 (three) times daily as needed for nausea or vomiting (headache / nausea). 07/30/16   Laurence Spates, MD      Family History  Problem Relation Age of Onset  . Hypertension Mother      Social History  Substance Use Topics  .  Smoking status: Current Every Day Smoker    Packs/day: 0.50  . Smokeless tobacco: Never Used  . Alcohol use Yes     Comment: occ     Allergies     Patient has no known allergies.    Review of Systems  10 Systems reviewed and are negative for acute change except as noted in the HPI.   Physical Exam Updated Vital Signs BP 142/83 (BP Location: Right Arm)   Pulse 71   Temp 97.8 F (36.6 C) (Oral)   Resp 18   Ht 5\' 4"  (1.626 m)   Wt 115 lb (52.2 kg)   LMP 06/29/2016 Comment: HCG neg 07/07/16  SpO2 100%   BMI 19.74 kg/m   Physical Exam  Constitutional: She is oriented to person, place, and time. She appears well-developed and well-nourished. No distress.  HENT:  Head: Normocephalic and atraumatic.  Moist mucous membranes  Eyes: Conjunctivae are normal.  Neck: Neck supple.  Cardiovascular: Normal rate, regular rhythm and normal heart sounds.   No murmur heard. Pulmonary/Chest: Effort normal and breath sounds normal.  Abdominal: Soft. Bowel sounds are normal. She exhibits no distension. There is no tenderness.  Genitourinary:  Genitourinary Comments: Normal external vaginal genitalia, large amount of white discharge in vaginal vault, no cervical motion or adnexal tenderness  Musculoskeletal: She exhibits no edema.  Neurological: She is alert  and oriented to person, place, and time.  Fluent speech  Skin: Skin is warm and dry.  Psychiatric:  Flat affect, avoids eye contact  Nursing note and vitals reviewed.  Chaperone was present during exam.    ED Treatments / Results  Labs (all labs ordered are listed, but only abnormal results are displayed) Labs Reviewed  WET PREP, GENITAL - Abnormal; Notable for the following:       Result Value   Clue Cells Wet Prep HPF POC PRESENT (*)    WBC, Wet Prep HPF POC MANY (*)    All other components within normal limits  COMPREHENSIVE METABOLIC PANEL - Abnormal; Notable for the following:    Potassium 3.3 (*)    Chloride 97  (*)    Total Protein 8.4 (*)    Albumin 5.1 (*)    Total Bilirubin 1.3 (*)    All other components within normal limits  CBC WITH DIFFERENTIAL/PLATELET - Abnormal; Notable for the following:    Hemoglobin 16.8 (*)    HCT 49.1 (*)    MCV 101.9 (*)    MCH 34.9 (*)    RDW 11.4 (*)    Neutro Abs 8.9 (*)    All other components within normal limits  URINALYSIS, ROUTINE W REFLEX MICROSCOPIC - Abnormal; Notable for the following:    Color, Urine AMBER (*)    APPearance CLOUDY (*)    Specific Gravity, Urine 1.038 (*)    Hgb urine dipstick MODERATE (*)    Bilirubin Urine SMALL (*)    Ketones, ur >80 (*)    Protein, ur 100 (*)    Leukocytes, UA SMALL (*)    All other components within normal limits  URINALYSIS, MICROSCOPIC (REFLEX) - Abnormal; Notable for the following:    Bacteria, UA FEW (*)    Squamous Epithelial / LPF 6-30 (*)    All other components within normal limits  LIPASE, BLOOD  PREGNANCY, URINE  GC/CHLAMYDIA PROBE AMP ([place]) NOT AT [facility]     EKG  EKG Interpretation  Date/Time:    Ventricular Rate:    PR Interval:    QRS Duration:   QT Interval:    QTC Calculation:   R Axis:     Text Interpretation:           Radiology No results found.  Procedures Procedures (including critical care time) Procedures  Medications Ordered in ED  Medications  sodium chloride 0.9 % bolus 1,000 mL (0 mLs Intravenous Stopped 07/30/16 1200)  metoCLOPramide (REGLAN) injection 10 mg (10 mg Intravenous Given 07/30/16 1104)  ketorolac (TORADOL) 30 MG/ML injection 30 mg (30 mg Intravenous Given 07/30/16 1236)  promethazine (PHENERGAN) tablet 25 mg (25 mg Oral Given 07/30/16 1236)     Initial Impression / Assessment and Plan / ED Course  I have reviewed the triage vital signs and the nursing notes.  Pertinent labs & imaging results that were available during my care of the patient were reviewed by me and considered in my medical decision making (see chart for  details).  Clinical Course     Pt w/ h/o cyclical vomiting p/w L days of vomiting, diarrhea, as well as reports of vaginal discharge. She was in no acute distress on my exam although she did start retching soon after I arrived. Vital signs notable for mild hypertension. No focal abdominal tenderness. Gave IV fluids, Reglan and obtained above lab work.  Labs were overall reassuring. Wet prep shows clue cells, patient already has course of  Flagyl that she was given recently but has not yet taken. Instructed to complete this course. I did offer STD treatment the patient recently received these medications and declined.  I counseled the patient on the importance of stopping all marijuana use as I suspect this may be contributing to her symptoms.  After receiving medications, the patient was resting comfortably with no further vomiting and was able to drink liquids. Discussed supportive care and reviewed return precautions. Patient discharged in satisfactory condition.  Final Clinical Impressions(s) / ED Diagnoses   Final diagnoses:  Nausea vomiting and diarrhea  Bacterial vaginosis     Discharge Medication List as of 07/30/2016 12:54 PM    START taking these medications   Details  metoCLOPramide (REGLAN) 10 MG tablet Take 1 tablet (10 mg total) by mouth 3 (three) times daily as needed for nausea or vomiting (headache / nausea)., Starting Fri 07/30/2016, Print           Laurence Spatesachel Morgan Lolita Faulds, MD 07/30/16 [phone]

## 2016-07-30 NOTE — ED Notes (Signed)
Pt unable to void at this time. 

## 2016-07-30 NOTE — ED Triage Notes (Signed)
C/o n/v/d since Tuesday. C/o pain to right side. C/o vaginal discharge.

## 2016-07-30 NOTE — ED Notes (Signed)
MD in to do pelvic exam, pt refuses at this time due to feeling like laying down will make her vomit. Planning to do after medication administered.

## 2016-08-02 LAB — GC/CHLAMYDIA PROBE AMP (~~LOC~~) NOT AT ARMC
Chlamydia: NEGATIVE
Neisseria Gonorrhea: NEGATIVE

## 2016-12-17 ENCOUNTER — Emergency Department (HOSPITAL_COMMUNITY): Payer: No Typology Code available for payment source

## 2016-12-17 ENCOUNTER — Emergency Department (HOSPITAL_COMMUNITY)
Admission: EM | Admit: 2016-12-17 | Discharge: 2016-12-17 | Disposition: A | Discharge status: Home / Self Care | Payer: No Typology Code available for payment source | Attending: Emergency Medicine | Admitting: Emergency Medicine

## 2016-12-17 ENCOUNTER — Encounter (HOSPITAL_COMMUNITY): Payer: Self-pay | Admitting: Emergency Medicine

## 2016-12-17 DIAGNOSIS — Z79899 Other long term (current) drug therapy: Secondary | ICD-10-CM | POA: Diagnosis not present

## 2016-12-17 DIAGNOSIS — Y999 Unspecified external cause status: Secondary | ICD-10-CM | POA: Diagnosis not present

## 2016-12-17 DIAGNOSIS — M25561 Pain in right knee: Secondary | ICD-10-CM | POA: Diagnosis not present

## 2016-12-17 DIAGNOSIS — F17200 Nicotine dependence, unspecified, uncomplicated: Secondary | ICD-10-CM | POA: Diagnosis not present

## 2016-12-17 DIAGNOSIS — M79631 Pain in right forearm: Secondary | ICD-10-CM

## 2016-12-17 DIAGNOSIS — S59911A Unspecified injury of right forearm, initial encounter: Secondary | ICD-10-CM | POA: Diagnosis present

## 2016-12-17 DIAGNOSIS — Y939 Activity, unspecified: Secondary | ICD-10-CM | POA: Insufficient documentation

## 2016-12-17 DIAGNOSIS — Y92410 Unspecified street and highway as the place of occurrence of the external cause: Secondary | ICD-10-CM | POA: Insufficient documentation

## 2016-12-17 MED ORDER — IBUPROFEN 200 MG PO TABS
600.0000 mg | ORAL_TABLET | Freq: Once | ORAL | Status: AC
  Administered 2016-12-17: 600 mg via ORAL
  Filled 2016-12-17: qty 3
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
## 2016-12-17 NOTE — Discharge Instructions (Signed)
Alternate 600 mg of ibuprofen and 500 mg of Tylenol every 3 hours for the next few days. You may take Pepcid with this to help reduce the likelihood of upset stomach. Ice to areas of soreness for the next few days and then may move to heat. Expect to be sore for the next few day and follow up with primary care physician for recheck of ongoing symptoms but return to ER for emergent changing or worsening of symptoms.

## 2017-01-18 ENCOUNTER — Encounter (HOSPITAL_COMMUNITY): Payer: Self-pay | Admitting: Emergency Medicine

## 2017-01-18 ENCOUNTER — Emergency Department (HOSPITAL_COMMUNITY)
Admission: EM | Admit: 2017-01-18 | Discharge: 2017-01-18 | Discharge status: Against Medical Advice | Payer: Medicaid Other | Attending: Emergency Medicine | Admitting: Emergency Medicine

## 2017-01-18 DIAGNOSIS — G43A0 Cyclical vomiting, not intractable: Secondary | ICD-10-CM | POA: Insufficient documentation

## 2017-01-18 DIAGNOSIS — R1115 Cyclical vomiting syndrome unrelated to migraine: Secondary | ICD-10-CM

## 2017-01-18 DIAGNOSIS — F17200 Nicotine dependence, unspecified, uncomplicated: Secondary | ICD-10-CM | POA: Insufficient documentation

## 2017-01-18 DIAGNOSIS — F1210 Cannabis abuse, uncomplicated: Secondary | ICD-10-CM | POA: Insufficient documentation

## 2017-01-18 DIAGNOSIS — F419 Anxiety disorder, unspecified: Secondary | ICD-10-CM | POA: Insufficient documentation

## 2017-01-18 DIAGNOSIS — F209 Schizophrenia, unspecified: Secondary | ICD-10-CM | POA: Insufficient documentation

## 2017-01-18 DIAGNOSIS — Z79899 Other long term (current) drug therapy: Secondary | ICD-10-CM | POA: Insufficient documentation

## 2017-01-18 LAB — COMPREHENSIVE METABOLIC PANEL
ALK PHOS: 63 U/L (ref 38–126)
ALT: 29 U/L (ref 14–54)
AST: 27 U/L (ref 15–41)
Albumin: 4.9 g/dL (ref 3.5–5.0)
Anion gap: 10 (ref 5–15)
BUN: 12 mg/dL (ref 6–20)
CALCIUM: 10.4 mg/dL — AB (ref 8.9–10.3)
CO2: 26 mmol/L (ref 22–32)
CREATININE: 0.98 mg/dL (ref 0.44–1.00)
Chloride: 101 mmol/L (ref 101–111)
Glucose, Bld: 118 mg/dL — ABNORMAL HIGH (ref 65–99)
Potassium: 3.6 mmol/L (ref 3.5–5.1)
Sodium: 137 mmol/L (ref 135–145)
Total Bilirubin: 2.5 mg/dL — ABNORMAL HIGH (ref 0.3–1.2)
Total Protein: 8.7 g/dL — ABNORMAL HIGH (ref 6.5–8.1)

## 2017-01-18 LAB — CBC
HCT: 52.8 % — ABNORMAL HIGH (ref 36.0–46.0)
HEMOGLOBIN: 18.4 g/dL — AB (ref 12.0–15.0)
MCH: 35.5 pg — ABNORMAL HIGH (ref 26.0–34.0)
MCHC: 34.8 g/dL (ref 30.0–36.0)
MCV: 101.9 fL — ABNORMAL HIGH (ref 78.0–100.0)
PLATELETS: 293 10*3/uL (ref 150–400)
RBC: 5.18 MIL/uL — AB (ref 3.87–5.11)
RDW: 12.4 % (ref 11.5–15.5)
WBC: 7.7 10*3/uL (ref 4.0–10.5)

## 2017-01-18 LAB — LIPASE, BLOOD: LIPASE: 26 U/L (ref 11–51)

## 2017-01-18 MED ORDER — SODIUM CHLORIDE 0.9 % IV BOLUS (SEPSIS)
1000.0000 mL | Freq: Once | INTRAVENOUS | Status: AC
  Administered 2017-01-18: 1000 mL via INTRAVENOUS

## 2017-01-18 MED ORDER — HALOPERIDOL LACTATE 5 MG/ML IJ SOLN
2.0000 mg | Freq: Once | INTRAMUSCULAR | Status: AC
  Administered 2017-01-18: 2 mg via INTRAMUSCULAR
  Filled 2017-01-18: qty 1

## 2017-01-18 MED ORDER — ONDANSETRON 4 MG PO TBDP
ORAL_TABLET | ORAL | Status: AC
  Filled 2017-01-18: qty 1

## 2017-01-18 MED ORDER — ONDANSETRON 4 MG PO TBDP
4.0000 mg | ORAL_TABLET | Freq: Once | ORAL | Status: AC
  Administered 2017-01-18: 4 mg via ORAL

## 2017-01-18 NOTE — ED Provider Notes (Signed)
[facility]-EMERGENCY DEPT Provider Note   CSN: [id number] Arrival date & time: 01/18/17  1720     History   Chief Complaint Chief Complaint  Patient presents with  . Abdominal Pain  . Emesis    HPI Angela Christian is a 21 y.o. female.  This a 21 year old female who presents with multiple days of nausea and vomiting. This patient is well-known to the emergency department.  She has frequent episodes of hyperemesis, most likely due to marijuana use- she smokes daily. Patient states that her back started hurting her this morning.  Denies any fever, dysuria, diarrhea, constipation.  No vaginal discharge.  Last menstrual cycle ended 3 days ago      Past Medical History:  Diagnosis Date  . Anxiety   . Cyclical vomiting syndrome   . Schizophrenia [facility](HCC)     Patient Active Problem List   Diagnosis Date Noted  . MDD (major depressive disorder) 09/10/2015  . Major depressive disorder, recurrent episode (HCC) 09/10/2015  . Polysubstance abuse 09/10/2015  . Unspecified mood (affective) disorder (HCC) 09/10/2015  . Secondary amenorrhea 02/27/2015  . Cyclical vomiting syndrome 02/10/2015    Past Surgical History:  Procedure Laterality Date  . TONSILLECTOMY      OB History    Gravida Para Term Preterm AB Living   0 0 0 0 0 0   SAB TAB Ectopic Multiple Live Births   0 0 0 0         Home Medications    Prior to Admission medications   Medication Sig Start Date End Date Taking? Authorizing Provider  metoCLOPramide (REGLAN) 10 MG tablet Take 1 tablet (10 mg total) by mouth 3 (three) times daily as needed for nausea or vomiting (headache / nausea). 07/30/16   Little, Ambrose Finlandachel Morgan, MD    Family History Family History  Problem Relation Age of Onset  . Hypertension Mother     Social History Social History  Substance Use Topics  . Smoking status: Current Every Day Smoker    Packs/day: 0.50  . Smokeless tobacco: Never Used  . Alcohol use Yes     Comment: occ      Allergies   Patient has no known allergies.   Review of Systems Review of Systems  Constitutional: Negative for fever.  Cardiovascular: Negative for chest pain.  Gastrointestinal: Positive for abdominal pain, nausea and vomiting. Negative for constipation and diarrhea.  Genitourinary: Negative for dysuria and vaginal discharge.  Musculoskeletal: Positive for back pain.  All other systems reviewed and are negative.    Physical Exam Updated Vital Signs BP 127/74   Pulse 66   Temp (!) 97.3 F (36.3 C) (Oral)   Resp 16   Ht 5\' 4"  (1.626 m)   LMP 01/13/2017   SpO2 100%   Physical Exam  Constitutional: She appears well-developed and well-nourished.  HENT:  Head: Normocephalic.  Eyes: Pupils are equal, round, and reactive to light.  Neck: Normal range of motion.  Cardiovascular: Normal rate.   Pulmonary/Chest: Effort normal.  Abdominal: Soft. She exhibits no distension. There is no tenderness.  Musculoskeletal: Normal range of motion.  Neurological: She is alert.  Skin: Skin is warm.  Psychiatric: She has a normal mood and affect.  Nursing note and vitals reviewed.    ED Treatments / Results  Labs (all labs ordered are listed, but only abnormal results are displayed) Labs Reviewed  COMPREHENSIVE METABOLIC PANEL - Abnormal; Notable for the following:       Result Value  Glucose, Bld 118 (*)    Calcium 10.4 (*)    Total Protein 8.7 (*)    Total Bilirubin 2.5 (*)    All other components within normal limits  CBC - Abnormal; Notable for the following:    RBC 5.18 (*)    Hemoglobin 18.4 (*)    HCT 52.8 (*)    MCV 101.9 (*)    MCH 35.5 (*)    All other components within normal limits  LIPASE, BLOOD  URINALYSIS, ROUTINE W REFLEX MICROSCOPIC  RAPID URINE DRUG SCREEN, HOSP PERFORMED  POC URINE PREG, ED    EKG  EKG Interpretation None       Radiology No results found.  Procedures Procedures (including critical care time)  Medications Ordered in  ED Medications  ondansetron (ZOFRAN-ODT) 4 MG disintegrating tablet (not administered)  ondansetron (ZOFRAN-ODT) disintegrating tablet 4 mg (4 mg Oral Given 01/18/17 1731)  sodium chloride 0.9 % bolus 1,000 mL (1,000 mLs Intravenous New Bag/Given 01/18/17 2139)  haloperidol lactate (HALDOL) injection 2 mg (2 mg Intramuscular Given 01/18/17 2139)     Initial Impression / Assessment and Plan / ED Course  I have reviewed the triage vital signs and the nursing notes.  Pertinent labs & imaging results that were available during my care of the patient were reviewed by me and considered in my medical decision making (see chart for details).     Will give IV fluids IM Haldol, obtain UA  The nurse went back to check on patient progress.  She found that patient had removed her IV and left AMA  Final Clinical Impressions(s) / ED Diagnoses   Final diagnoses:  Non-intractable cyclical vomiting with nausea    New Prescriptions New Prescriptions   No medications on file     Earley Favor, NP 01/18/17 2138    Earley Favor, NP 01/18/17 2239    Charlynne Pander, MD 01/18/17 [phone]

## 2017-01-18 NOTE — ED Notes (Signed)
This nurse enters room and PT has pulled out IV and is not in room.

## 2017-01-18 NOTE — ED Triage Notes (Signed)
Pt. Stated, I've had stomach pain with nausea, vomiting since Thursday.

## 2017-01-18 NOTE — ED Notes (Signed)
Pt actively vomiting in lobby.  

## 2017-01-30 ENCOUNTER — Encounter (HOSPITAL_COMMUNITY): Payer: Self-pay

## 2017-01-30 ENCOUNTER — Emergency Department (HOSPITAL_COMMUNITY)
Admission: EM | Admit: 2017-01-30 | Discharge: 2017-01-30 | Disposition: A | Discharge status: Home / Self Care | Payer: Self-pay | Attending: Emergency Medicine | Admitting: Emergency Medicine

## 2017-01-30 ENCOUNTER — Emergency Department (HOSPITAL_COMMUNITY): Payer: Self-pay

## 2017-01-30 DIAGNOSIS — F17200 Nicotine dependence, unspecified, uncomplicated: Secondary | ICD-10-CM | POA: Insufficient documentation

## 2017-01-30 DIAGNOSIS — R112 Nausea with vomiting, unspecified: Secondary | ICD-10-CM | POA: Insufficient documentation

## 2017-01-30 LAB — CBC
HEMATOCRIT: 49.9 % — AB (ref 36.0–46.0)
HEMOGLOBIN: 17.6 g/dL — AB (ref 12.0–15.0)
MCH: 35.8 pg — AB (ref 26.0–34.0)
MCHC: 35.3 g/dL (ref 30.0–36.0)
MCV: 101.4 fL — ABNORMAL HIGH (ref 78.0–100.0)
Platelets: 349 10*3/uL (ref 150–400)
RBC: 4.92 MIL/uL (ref 3.87–5.11)
RDW: 12.8 % (ref 11.5–15.5)
WBC: 9.3 10*3/uL (ref 4.0–10.5)

## 2017-01-30 LAB — COMPREHENSIVE METABOLIC PANEL
ALBUMIN: 4.9 g/dL (ref 3.5–5.0)
ALT: 21 U/L (ref 14–54)
ANION GAP: 14 (ref 5–15)
AST: 24 U/L (ref 15–41)
Alkaline Phosphatase: 48 U/L (ref 38–126)
BUN: 14 mg/dL (ref 6–20)
CO2: 25 mmol/L (ref 22–32)
Calcium: 10.3 mg/dL (ref 8.9–10.3)
Chloride: 100 mmol/L — ABNORMAL LOW (ref 101–111)
Creatinine, Ser: 0.97 mg/dL (ref 0.44–1.00)
GFR calc non Af Amer: >60 mL/min (ref >60)
GLUCOSE: 100 mg/dL — AB (ref 65–99)
POTASSIUM: 3.5 mmol/L (ref 3.5–5.1)
SODIUM: 139 mmol/L (ref 135–145)
TOTAL PROTEIN: 8.5 g/dL — AB (ref 6.5–8.1)
Total Bilirubin: 1.2 mg/dL (ref 0.3–1.2)

## 2017-01-30 LAB — RAPID URINE DRUG SCREEN, HOSP PERFORMED
Amphetamines: NOT DETECTED
Barbiturates: NOT DETECTED
Benzodiazepines: NOT DETECTED
Cocaine: NOT DETECTED
Opiates: NOT DETECTED
Tetrahydrocannabinol: POSITIVE — AB

## 2017-01-30 LAB — LIPASE, BLOOD: Lipase: 11 U/L (ref 11–51)

## 2017-01-30 LAB — URINALYSIS, ROUTINE W REFLEX MICROSCOPIC
BACTERIA UA: NONE SEEN
BILIRUBIN URINE: NEGATIVE
GLUCOSE, UA: NEGATIVE mg/dL
KETONES UR: 20 mg/dL — AB
Leukocytes, UA: NEGATIVE
NITRITE: NEGATIVE
PROTEIN: 30 mg/dL — AB
Specific Gravity, Urine: 1.035 — ABNORMAL HIGH (ref 1.005–1.030)
pH: 5 (ref 5.0–8.0)

## 2017-01-30 LAB — POC URINE PREG, ED: PREG TEST UR: NEGATIVE

## 2017-01-30 MED ORDER — SODIUM CHLORIDE 0.9 % IV BOLUS (SEPSIS)
1000.0000 mL | Freq: Once | INTRAVENOUS | Status: AC
  Administered 2017-01-30: 1000 mL via INTRAVENOUS

## 2017-01-30 MED ORDER — ONDANSETRON 8 MG PO TBDP
8.0000 mg | ORAL_TABLET | Freq: Once | ORAL | Status: DC
  Filled 2017-01-30: qty 1

## 2017-01-30 MED ORDER — LORAZEPAM 1 MG PO TABS: 1.0000 mg | ORAL_TABLET | Freq: Three times a day (TID) | ORAL | 0 refills | Status: DC | PRN

## 2017-01-30 MED ORDER — SODIUM CHLORIDE 0.9 % IV SOLN
INTRAVENOUS | Status: DC
  Administered 2017-01-30: 125 mL/h via INTRAVENOUS

## 2017-01-30 MED ORDER — NORTRIPTYLINE HCL 10 MG PO CAPS: 10.0000 mg | ORAL_CAPSULE | Freq: Every day | ORAL | 0 refills | Status: DC

## 2017-01-30 MED ORDER — METOCLOPRAMIDE HCL 5 MG/ML IJ SOLN
10.0000 mg | Freq: Once | INTRAMUSCULAR | Status: AC
  Administered 2017-01-30: 10 mg via INTRAVENOUS
  Filled 2017-01-30: qty 2

## 2017-01-30 MED ORDER — METOCLOPRAMIDE HCL 10 MG PO TABS: 10.0000 mg | ORAL_TABLET | Freq: Four times a day (QID) | ORAL | 0 refills | Status: DC | PRN

## 2017-01-30 MED ORDER — LORAZEPAM 2 MG/ML IJ SOLN
1.0000 mg | Freq: Once | INTRAMUSCULAR | Status: AC
  Administered 2017-01-30: 1 mg via INTRAVENOUS
  Filled 2017-01-30: qty 1

## 2017-01-30 NOTE — ED Triage Notes (Signed)
States has been vomiting since June 29th has seen GI doctors without any improvement.  And now dysuria and bil flank pain for 2 days with nausea and vomiting no fever voiced.

## 2017-01-30 NOTE — Discharge Instructions (Signed)
The cause of your nausea and vomiting, is not clear.  Testing today was reassuring.  There is no sign of ovarian cyst or urinary tract infection.  It is important to follow-up with a gastroenterologist, and possibly a motility specialist, regarding your ongoing problems.  It is also important to stop using marijuana products.  We are going to try some medications, which you have requested to see if they can help your problem.  All of these medicines can make you sleepy so be careful and do not drive, operate machinery, or climb ladders within 8 hours of taking the medications.

## 2017-01-30 NOTE — ED Notes (Signed)
Not enough urine provided for urinalysis or UDS. Pt informed and instructed that we would need a recollect of urine.

## 2017-01-30 NOTE — ED Notes (Signed)
Patient requested a second cup of water.

## 2017-01-30 NOTE — ED Notes (Signed)
Patient refused Zofran ODT. Patient stated, "why do they keep giving me the same thing and it never works." Patient stated she was going to ask the physician for something different.

## 2017-01-30 NOTE — ED Notes (Signed)
pts temp wouldn't take due to her eating ice chips.

## 2017-01-30 NOTE — ED Provider Notes (Signed)
[facility]-EMERGENCY DEPT Provider Note   CSN: [id number] Arrival date & time: 01/30/17  0645     History   Chief Complaint Chief Complaint  Patient presents with  . Emesis    HPI Angela Christian is a 21 y.o. female.  She presents for evaluation of ongoing nausea and vomiting, which is recurrent, for 2 weeks.  She is also started having some diarrhea in the last couple of days.  Her vomiting and nausea usually gets better when she takes a hot bath.  She continues to smoke marijuana, and understands the implication of ongoing use, which can cause nausea and vomiting.  She has a new job but has not been able to start it because of the vomiting.  This causes her great distress.  She denies fever, chest pain, persistent abdominal pain, back pain, weakness or dizziness.  She is using over-the-counter nausea medication, but it has not helped.  She states that she has been told that she has an ovarian cyst, but has not had the recommended follow-up, of a ultrasound.  This was based on CT imaging, December 2017.  She states that her periods have been normal.  She denies current vaginal bleeding, vaginal discharge, or missed menses.  There are no other known modifying factors.  HPI  Past Medical History:  Diagnosis Date  . Anxiety   . Cyclical vomiting syndrome   . Schizophrenia [facility])     Patient Active Problem List   Diagnosis Date Noted  . MDD (major depressive disorder) 09/10/2015  . Major depressive disorder, recurrent episode (HCC) 09/10/2015  . Polysubstance abuse 09/10/2015  . Unspecified mood (affective) disorder (HCC) 09/10/2015  . Secondary amenorrhea 02/27/2015  . Cyclical vomiting syndrome 02/10/2015    Past Surgical History:  Procedure Laterality Date  . TONSILLECTOMY      OB History    Gravida Para Term Preterm AB Living   0 0 0 0 0 0   SAB TAB Ectopic Multiple Live Births   0 0 0 0         Home Medications    Prior to Admission medications   Medication Sig  Start Date End Date Taking? Authorizing Provider  LORazepam (ATIVAN) 1 MG tablet Take 1 tablet (1 mg total) by mouth 3 (three) times daily as needed for anxiety. 01/30/17   Mancel Bale, MD  metoCLOPramide (REGLAN) 10 MG tablet Take 1 tablet (10 mg total) by mouth every 6 (six) hours as needed for nausea or vomiting. 01/30/17   Mancel Bale, MD  nortriptyline (PAMELOR) 10 MG capsule Take 1 capsule (10 mg total) by mouth at bedtime. 01/30/17   Mancel Bale, MD    Family History Family History  Problem Relation Age of Onset  . Hypertension Mother     Social History Social History  Substance Use Topics  . Smoking status: Current Every Day Smoker    Packs/day: 0.50  . Smokeless tobacco: Never Used  . Alcohol use Yes     Comment: occ     Allergies   Patient has no known allergies.   Review of Systems Review of Systems  All other systems reviewed and are negative.    Physical Exam Updated Vital Signs BP 119/85 (BP Location: Left Arm)   Pulse 62   Resp 16   Ht 5\' 4"  (1.626 m)   Wt 46.3 kg (102 lb)   LMP 01/13/2017   SpO2 100%   BMI 17.51 kg/m   Physical Exam  Constitutional: She is  oriented to person, place, and time. She appears well-developed and well-nourished. She appears distressed (Tearful).  HENT:  Head: Normocephalic and atraumatic.  Eyes: Pupils are equal, round, and reactive to light. Conjunctivae and EOM are normal.  Neck: Normal range of motion and phonation normal. Neck supple.  Cardiovascular: Normal rate and regular rhythm.   Pulmonary/Chest: Effort normal and breath sounds normal. She exhibits no tenderness.  Abdominal: Soft. She exhibits no distension. There is tenderness (Diffuse, moderate). There is no guarding.  Musculoskeletal: Normal range of motion.  Neurological: She is alert and oriented to person, place, and time. She exhibits normal muscle tone.  Skin: Skin is warm and dry.  Psychiatric: She has a normal mood and affect. Her behavior is  normal. Judgment and thought content normal.  Nursing note and vitals reviewed.    ED Treatments / Results  Labs (all labs ordered are listed, but only abnormal results are displayed) Labs Reviewed  COMPREHENSIVE METABOLIC PANEL - Abnormal; Notable for the following:       Result Value   Chloride 100 (*)    Glucose, Bld 100 (*)    Total Protein 8.5 (*)    All other components within normal limits  CBC - Abnormal; Notable for the following:    Hemoglobin 17.6 (*)    HCT 49.9 (*)    MCV 101.4 (*)    MCH 35.8 (*)    All other components within normal limits  URINALYSIS, ROUTINE W REFLEX MICROSCOPIC - Abnormal; Notable for the following:    Color, Urine AMBER (*)    APPearance HAZY (*)    Specific Gravity, Urine 1.035 (*)    Hgb urine dipstick SMALL (*)    Ketones, ur 20 (*)    Protein, ur 30 (*)    Squamous Epithelial / LPF 0-5 (*)    All other components within normal limits  RAPID URINE DRUG SCREEN, HOSP PERFORMED - Abnormal; Notable for the following:    Tetrahydrocannabinol POSITIVE (*)    All other components within normal limits  LIPASE, BLOOD  POC URINE PREG, ED    EKG  EKG Interpretation None       Radiology US Pelvis Complete  Result Date: 01/30/2017 CLINICAL DATA:  Vomiting EXAM: TRANSABDOMINAL ULTRASOUND OF PELVIS TECHNIQUE: Transabdominal ultrasound examination of the pelvis was performed including evaluation of the uterus, ovaries, adnexal regions, and pelvic cul-de-sac. COMPARISON:  CT 07/08/2016 FINDINGS: Uterus Measurements: 6.7 x 2.6 x 4.0 cm. No fibroids or other mass visualized. Endometrium Thickness: Normal thickness, 3 mm.  No focal abnormality visualized. Right ovary Measurements: 3.0 x 1.5 x 1.9 cm. Normal appearance/no adnexal mass. Left ovary Measurements: 2.7 x 1.5 x 2.3 cm. Normal appearance/no adnexal mass. Other findings:  No abnormal free fluid. IMPRESSION: Normal study. Electronically Signed   By: Charlett Nose M.D.   On: 01/30/2017 10:01     Procedures Procedures (including critical care time)  Medications Ordered in ED Medications  ondansetron (ZOFRAN-ODT) disintegrating tablet 8 mg (8 mg Oral Refused 01/30/17 0810)  0.9 %  sodium chloride infusion (125 mL/hr Intravenous New Bag/Given 01/30/17 1007)  sodium chloride 0.9 % bolus 1,000 mL (0 mLs Intravenous Stopped 01/30/17 0939)  metoCLOPramide (REGLAN) injection 10 mg (10 mg Intravenous Given 01/30/17 0814)  LORazepam (ATIVAN) injection 1 mg (1 mg Intravenous Given 01/30/17 0827)     Initial Impression / Assessment and Plan / ED Course  I have reviewed the triage vital signs and the nursing notes.  Pertinent labs & imaging results  that were available during my care of the patient were reviewed by me and considered in my medical decision making (see chart for details).  Clinical Course as of Jan 31 1239  Sun Jan 30, 2017  0818 Recurrent nausea and vomiting, most likely related to hyperemesis cannabinoid syndrome.  Poor follow-up for GYN abnormality, cyst.  Will treat symptomatically, run screening labs and check ultrasound for worsening of cyst.  [EW]  1018 At this time the patient is resting comfortably and expresses no discomfort.  [EW]    Clinical Course User Index [EW] Mancel BaleWentz, Naheem Mosco, MD     Patient Vitals for the past 24 hrs:  BP Pulse Resp SpO2 Height Weight  01/30/17 1119 119/85 62 16 100 % - -  01/30/17 1118 - 62 16 100 % - -  01/30/17 0906 108/73 96 14 98 % - -  01/30/17 0658 (!) 137/100 (!) 102 18 100 % 5\' 4"  (1.626 m) 46.3 kg (102 lb)    12:38 PM- Reevaluation with update and discussion. After initial assessment and treatment, an updated evaluation reveals she is comfortable, and has ambulated to the bathroom, without difficulty.  Findings discussed with the patient.  She requests additional medications, which were suggested to her by a "support group."  These include nortriptyline, amitriptyline, cyproheptadine, and several vitamins.  I agreed to a trial  of nortriptyline, and symptomatic treatment, with Reglan, and Ativan for short period of time.  All questions were answered. Pascual Mantel L    Final Clinical Impressions(s) / ED Diagnoses   Final diagnoses:  Nausea and vomiting, intractability of vomiting not specified, unspecified vomiting type   Nonspecific nausea and vomiting, with ongoing THC abuse.  Doubt serious bacterial infection, metabolic instability or impending vascular collapse.  Nursing Notes Reviewed/ Care Coordinated Applicable Imaging Reviewed Interpretation of Laboratory Data incorporated into ED treatment  The patient appears reasonably screened and/or stabilized for discharge and I doubt any other medical condition or other [facility] requiring further screening, evaluation, or treatment in the ED at this time prior to discharge.  Plan: Home Medications-continue usual medications; Home Treatments-gradually advance diet; return here if the recommended treatment, does not improve the symptoms; Recommended follow up-GI follow-up as soon as possible.  Consider seeing a motility specialist.   New Prescriptions New Prescriptions   LORAZEPAM (ATIVAN) 1 MG TABLET    Take 1 tablet (1 mg total) by mouth 3 (three) times daily as needed for anxiety.   METOCLOPRAMIDE (REGLAN) 10 MG TABLET    Take 1 tablet (10 mg total) by mouth every 6 (six) hours as needed for nausea or vomiting.   NORTRIPTYLINE (PAMELOR) 10 MG CAPSULE    Take 1 capsule (10 mg total) by mouth at bedtime.     Mancel BaleWentz, Sanela Evola, MD 01/30/17 1240

## 2017-03-12 ENCOUNTER — Ambulatory Visit (HOSPITAL_COMMUNITY)
Admission: EM | Admit: 2017-03-12 | Discharge: 2017-03-12 | Disposition: A | Discharge status: Home / Self Care | Payer: Medicaid Other | Attending: Family Medicine | Admitting: Family Medicine

## 2017-03-12 ENCOUNTER — Encounter (HOSPITAL_COMMUNITY): Payer: Self-pay | Admitting: *Deleted

## 2017-03-12 DIAGNOSIS — R1115 Cyclical vomiting syndrome unrelated to migraine: Secondary | ICD-10-CM

## 2017-03-12 DIAGNOSIS — G43A1 Cyclical vomiting, intractable: Secondary | ICD-10-CM

## 2017-03-12 DIAGNOSIS — R110 Nausea: Secondary | ICD-10-CM

## 2017-03-12 HISTORY — DX: Major depressive disorder, single episode, unspecified: F32.9

## 2017-03-12 HISTORY — DX: Depression, unspecified: F32.A

## 2017-03-12 MED ORDER — METOCLOPRAMIDE HCL 5 MG/ML IJ SOLN
INTRAMUSCULAR | Status: AC
  Filled 2017-03-12: qty 2

## 2017-03-12 MED ORDER — LORAZEPAM 2 MG/ML IJ SOLN
INTRAMUSCULAR | Status: AC
  Filled 2017-03-12: qty 1

## 2017-03-12 MED ORDER — LORAZEPAM 1 MG PO TABS: 1.0000 mg | ORAL_TABLET | Freq: Four times a day (QID) | ORAL | 0 refills | Status: DC | PRN

## 2017-03-12 MED ORDER — METOCLOPRAMIDE HCL 5 MG/ML IJ SOLN: 10.0000 mg | Freq: Once | INTRAMUSCULAR | Status: DC

## 2017-03-12 MED ORDER — LORAZEPAM 2 MG/ML IJ SOLN
2.0000 mg | Freq: Once | INTRAMUSCULAR | Status: AC
  Administered 2017-03-12: 2 mg via INTRAMUSCULAR

## 2017-03-12 MED ORDER — PROMETHAZINE HCL 25 MG RE SUPP: 25.0000 mg | Freq: Four times a day (QID) | RECTAL | 0 refills | Status: DC | PRN

## 2017-03-12 MED ORDER — METOCLOPRAMIDE HCL 5 MG/ML IJ SOLN
10.0000 mg | Freq: Once | INTRAMUSCULAR | Status: AC
  Administered 2017-03-12: 10 mg via INTRAMUSCULAR

## 2017-03-12 MED ORDER — METOCLOPRAMIDE HCL 10 MG PO TABS: 10.0000 mg | ORAL_TABLET | Freq: Four times a day (QID) | ORAL | 0 refills | Status: DC | PRN

## 2017-03-12 NOTE — ED Triage Notes (Signed)
Reports hx cyclic vomiting syndrome.  Just finished courses of Ativan x 2 wks and phenergan yesterday.  C/O vomiting since yesterday with inability to keep down any fluids.  Denies diarrhea.
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
## 2017-03-12 NOTE — ED Triage Notes (Signed)
Reports drinking alcohol daily recently, from "few shots to whole bottle" liquor.

## 2017-03-14 NOTE — ED Provider Notes (Signed)
  [facility]   [id number] 03/12/17 Arrival Time: 1812  ASSESSMENT & PLAN:  1. Intractable cyclical vomiting with nausea     Meds ordered this encounter  Medications  . LORazepam (ATIVAN) injection 2 mg  . metoCLOPramide (REGLAN) injection 10 mg  . LORazepam (ATIVAN) 1 MG tablet    Sig: Take 1 tablet (1 mg total) by mouth every 6 (six) hours as needed for anxiety.    Dispense:  10 tablet    Refill:  0  . metoCLOPramide (REGLAN) 10 MG tablet    Sig: Take 1 tablet (10 mg total) by mouth every 6 (six) hours as needed for nausea or vomiting.    Dispense:  10 tablet    Refill:  0  . promethazine (PHENERGAN) 25 MG suppository    Sig: Place 1 suppository (25 mg total) rectally every 6 (six) hours as needed for nausea or vomiting.    Dispense:  12 each    Refill:  0   Feeling much better after Ativan and Reglan IM in office. D/C to home with medications above. If she is not able to tolerate PO intake overnight she will proceed to the ED.  Reviewed expectations re: course of current medical issues. Questions answered. Outlined signs and symptoms indicating need for more acute intervention. Patient verbalized understanding. After Visit Summary given.   SUBJECTIVE:  Angela Christian is a 21 y.o. female who presents with complaint of history of vomiting episodes. Sporadic. Usually require being seen with "injections to help." Last episode several months ago. Reports past workups negative as to why she is having these symptoms. Current episode approx 24 hours. Patient's last menstrual period was 02/26/2017 (approximate). No diarrhea. No recent travel. Afebrile. Stomach "cramping". Typical symptoms for her. Ambulatory. Overall decreased PO intake. No self treatment.  ROS: As per HPI. All other systems negative.   OBJECTIVE:  Vitals:   03/12/17 1839  BP: (!) 155/102  Pulse: 87  Resp: 18  Temp: 98.2 F (36.8 C)  TempSrc: Oral  SpO2: 99%     General appearance: alert;  no distress; appears fatigued; actively vomiting Eyes: PERRLA; EOMI; conjunctiva normal Lungs: clear to auscultation bilaterally Heart: regular rate and rhythm Abdomen: soft, no specific tenderness; "just cramping" with palpation; bowel sounds normal; no masses or organomegaly; no guarding or rebound tenderness Back: no CVA tenderness Extremities: no cyanosis or edema; symmetrical with no gross deformities Skin: warm and dry Psychological: alert and cooperative; normal mood and affect  Past Medical History:  Diagnosis Date  . Anxiety   . Cyclical vomiting syndrome   . Depression   . Schizophrenia (HCC)     No Known Allergies  Family History  Problem Relation Age of Onset  . Hypertension Mother    Past Surgical History:  Procedure Laterality Date  . Greig Right, MD 03/14/17 [phone]

## 2017-04-10 ENCOUNTER — Emergency Department (HOSPITAL_COMMUNITY): Payer: Self-pay

## 2017-04-10 ENCOUNTER — Emergency Department (HOSPITAL_COMMUNITY)
Admission: EM | Admit: 2017-04-10 | Discharge: 2017-04-10 | Disposition: A | Discharge status: Home / Self Care | Payer: Self-pay | Attending: Emergency Medicine | Admitting: Emergency Medicine

## 2017-04-10 ENCOUNTER — Encounter (HOSPITAL_COMMUNITY): Payer: Self-pay | Admitting: Emergency Medicine

## 2017-04-10 DIAGNOSIS — R1115 Cyclical vomiting syndrome unrelated to migraine: Secondary | ICD-10-CM

## 2017-04-10 DIAGNOSIS — G43A0 Cyclical vomiting, not intractable: Secondary | ICD-10-CM | POA: Insufficient documentation

## 2017-04-10 DIAGNOSIS — F17210 Nicotine dependence, cigarettes, uncomplicated: Secondary | ICD-10-CM | POA: Insufficient documentation

## 2017-04-10 DIAGNOSIS — Z79899 Other long term (current) drug therapy: Secondary | ICD-10-CM | POA: Insufficient documentation

## 2017-04-10 LAB — COMPREHENSIVE METABOLIC PANEL
ALT: 45 U/L (ref 14–54)
ANION GAP: 14 (ref 5–15)
AST: 49 U/L — AB (ref 15–41)
Albumin: 4.9 g/dL (ref 3.5–5.0)
Alkaline Phosphatase: 67 U/L (ref 38–126)
BILIRUBIN TOTAL: 1.9 mg/dL — AB (ref 0.3–1.2)
BUN: 20 mg/dL (ref 6–20)
CALCIUM: 10.5 mg/dL — AB (ref 8.9–10.3)
CO2: 27 mmol/L (ref 22–32)
Chloride: 96 mmol/L — ABNORMAL LOW (ref 101–111)
Creatinine, Ser: 0.94 mg/dL (ref 0.44–1.00)
GLUCOSE: 92 mg/dL (ref 65–99)
POTASSIUM: 3.1 mmol/L — AB (ref 3.5–5.1)
SODIUM: 137 mmol/L (ref 135–145)
TOTAL PROTEIN: 8.9 g/dL — AB (ref 6.5–8.1)

## 2017-04-10 LAB — URINALYSIS, MICROSCOPIC (REFLEX): Bacteria, UA: NONE SEEN

## 2017-04-10 LAB — CBC
HEMATOCRIT: 51.3 % — AB (ref 36.0–46.0)
HEMOGLOBIN: 18.5 g/dL — AB (ref 12.0–15.0)
MCH: 37.4 pg — ABNORMAL HIGH (ref 26.0–34.0)
MCHC: 36.1 g/dL — AB (ref 30.0–36.0)
MCV: 103.8 fL — ABNORMAL HIGH (ref 78.0–100.0)
Platelets: 248 10*3/uL (ref 150–400)
RBC: 4.94 MIL/uL (ref 3.87–5.11)
RDW: 12.7 % (ref 11.5–15.5)
WBC: 9.2 10*3/uL (ref 4.0–10.5)

## 2017-04-10 LAB — URINALYSIS, ROUTINE W REFLEX MICROSCOPIC
Glucose, UA: NEGATIVE mg/dL
Ketones, ur: >80 mg/dL — AB
NITRITE: NEGATIVE
PH: 6.5 (ref 5.0–8.0)
Protein, ur: 100 mg/dL — AB
SPECIFIC GRAVITY, URINE: 1.025 (ref 1.005–1.030)

## 2017-04-10 LAB — LIPASE, BLOOD: LIPASE: 18 U/L (ref 11–51)

## 2017-04-10 LAB — POC URINE PREG, ED: PREG TEST UR: NEGATIVE

## 2017-04-10 MED ORDER — PROMETHAZINE HCL 25 MG/ML IJ SOLN
25.0000 mg | Freq: Once | INTRAMUSCULAR | Status: AC
  Administered 2017-04-10: 25 mg via INTRAVENOUS
  Filled 2017-04-10: qty 1

## 2017-04-10 MED ORDER — POTASSIUM CHLORIDE 10 MEQ/100ML IV SOLN
10.0000 meq | Freq: Once | INTRAVENOUS | Status: AC
  Administered 2017-04-10: 10 meq via INTRAVENOUS
  Filled 2017-04-10: qty 100

## 2017-04-10 MED ORDER — KETOROLAC TROMETHAMINE 30 MG/ML IJ SOLN
15.0000 mg | Freq: Once | INTRAMUSCULAR | Status: AC
  Administered 2017-04-10: 15 mg via INTRAVENOUS
  Filled 2017-04-10: qty 1

## 2017-04-10 MED ORDER — DIPHENHYDRAMINE HCL 50 MG/ML IJ SOLN
INTRAMUSCULAR | Status: AC
  Administered 2017-04-10: 25 mg via INTRAVENOUS
  Filled 2017-04-10: qty 1

## 2017-04-10 MED ORDER — KETOROLAC TROMETHAMINE 30 MG/ML IJ SOLN: 30.0000 mg | Freq: Once | INTRAMUSCULAR | Status: DC

## 2017-04-10 MED ORDER — METOCLOPRAMIDE HCL 5 MG/ML IJ SOLN
INTRAMUSCULAR | Status: AC
  Administered 2017-04-10: 5 mg via INTRAVENOUS
  Filled 2017-04-10: qty 2

## 2017-04-10 MED ORDER — METOCLOPRAMIDE HCL 5 MG/ML IJ SOLN
5.0000 mg | Freq: Once | INTRAMUSCULAR | Status: AC
Start: 2017-04-10 — End: 2017-04-10
  Administered 2017-04-10: 5 mg via INTRAVENOUS

## 2017-04-10 MED ORDER — SODIUM CHLORIDE 0.9 % IV BOLUS (SEPSIS)
1000.0000 mL | Freq: Once | INTRAVENOUS | Status: AC
  Administered 2017-04-10: 1000 mL via INTRAVENOUS

## 2017-04-10 MED ORDER — DIPHENHYDRAMINE HCL 50 MG/ML IJ SOLN
25.0000 mg | Freq: Once | INTRAMUSCULAR | Status: AC
  Administered 2017-04-10: 25 mg via INTRAVENOUS

## 2017-04-10 MED ORDER — LORAZEPAM 2 MG/ML IJ SOLN
0.5000 mg | Freq: Once | INTRAMUSCULAR | Status: AC
  Administered 2017-04-10: 0.5 mg via INTRAVENOUS
  Filled 2017-04-10: qty 1

## 2017-04-10 MED ORDER — PROMETHAZINE HCL 25 MG PO TABS: 25.0000 mg | ORAL_TABLET | Freq: Four times a day (QID) | ORAL | 0 refills | Status: DC | PRN

## 2017-04-10 MED ORDER — ONDANSETRON 4 MG PO TBDP
4.0000 mg | ORAL_TABLET | Freq: Once | ORAL | Status: AC | PRN
  Administered 2017-04-10: 4 mg via ORAL
  Filled 2017-04-10: qty 1

## 2017-04-10 NOTE — ED Notes (Signed)
Discharge instructions reviewed with patient. Patient verbalizes understanding. VSS.   

## 2017-04-10 NOTE — ED Provider Notes (Signed)
I saw and evaluated the patient, reviewed the resident's note and I agree with the findings and plan.   EKG Interpretation None     21 year old female here complaining of emesis. History of cyclical vomiting secondary to use of marijuana. Mildly dehydrated here. Will hydrate and instructed to follow-up with her GI doctor   Lorre Nick, MD 04/10/17 1901
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
## 2017-07-19 NOTE — L&D Delivery Note (Signed)
Delivery Note  At 11:43 AM a viable female, named Angela Christian,  was delivered via Vaginal, Spontaneous (Presentation: LOA ).  APGAR: 9, 9 .   Placenta status: spontaneous with 3 Vx Cord  Anesthesia:  epidural Episiotomy: None Lacerations: Bilateral Labial lacerations Suture Repair: 4-0 Monocryl Est. Blood Loss (mL):  200  Mom to postpartum.  Baby to Couplet care / Skin to Skin  Plans outpatient circumcision  Crist FatSandra A Abdulrahman Bracey 06/13/2018, 12:08 PM

## 2017-08-08 ENCOUNTER — Emergency Department (HOSPITAL_COMMUNITY)
Admission: EM | Admit: 2017-08-08 | Discharge: 2017-08-08 | Disposition: A | Discharge status: Home / Self Care | Payer: Self-pay | Attending: Emergency Medicine | Admitting: Emergency Medicine

## 2017-08-08 ENCOUNTER — Encounter (HOSPITAL_COMMUNITY): Payer: Self-pay | Admitting: Emergency Medicine

## 2017-08-08 DIAGNOSIS — R1010 Upper abdominal pain, unspecified: Secondary | ICD-10-CM | POA: Insufficient documentation

## 2017-08-08 DIAGNOSIS — R112 Nausea with vomiting, unspecified: Secondary | ICD-10-CM | POA: Insufficient documentation

## 2017-08-08 DIAGNOSIS — Z79899 Other long term (current) drug therapy: Secondary | ICD-10-CM | POA: Insufficient documentation

## 2017-08-08 DIAGNOSIS — F17210 Nicotine dependence, cigarettes, uncomplicated: Secondary | ICD-10-CM | POA: Insufficient documentation

## 2017-08-08 LAB — CBC
HCT: 52.3 % — ABNORMAL HIGH (ref 36.0–46.0)
Hemoglobin: 19 g/dL — ABNORMAL HIGH (ref 12.0–15.0)
MCH: 37.3 pg — AB (ref 26.0–34.0)
MCHC: 36.3 g/dL — AB (ref 30.0–36.0)
MCV: 102.5 fL — ABNORMAL HIGH (ref 78.0–100.0)
PLATELETS: 272 10*3/uL (ref 150–400)
RBC: 5.1 MIL/uL (ref 3.87–5.11)
RDW: 11.7 % (ref 11.5–15.5)
WBC: 8.9 10*3/uL (ref 4.0–10.5)

## 2017-08-08 LAB — COMPREHENSIVE METABOLIC PANEL
ALT: 28 U/L (ref 14–54)
ANION GAP: 14 (ref 5–15)
AST: 31 U/L (ref 15–41)
Albumin: 5.4 g/dL — ABNORMAL HIGH (ref 3.5–5.0)
Alkaline Phosphatase: 53 U/L (ref 38–126)
BUN: 20 mg/dL (ref 6–20)
CHLORIDE: 97 mmol/L — AB (ref 101–111)
CO2: 24 mmol/L (ref 22–32)
Calcium: 10.4 mg/dL — ABNORMAL HIGH (ref 8.9–10.3)
Creatinine, Ser: 0.99 mg/dL (ref 0.44–1.00)
GFR calc non Af Amer: >60 mL/min (ref >60)
Glucose, Bld: 92 mg/dL (ref 65–99)
Potassium: 3.5 mmol/L (ref 3.5–5.1)
SODIUM: 135 mmol/L (ref 135–145)
Total Bilirubin: 1.9 mg/dL — ABNORMAL HIGH (ref 0.3–1.2)
Total Protein: 9.2 g/dL — ABNORMAL HIGH (ref 6.5–8.1)

## 2017-08-08 LAB — I-STAT BETA HCG BLOOD, ED (MC, WL, AP ONLY): I-stat hCG, quantitative: <5 m[IU]/mL (ref <5)

## 2017-08-08 MED ORDER — METOCLOPRAMIDE HCL 10 MG PO TABS: 10.0000 mg | ORAL_TABLET | Freq: Four times a day (QID) | ORAL | 0 refills | Status: DC

## 2017-08-08 MED ORDER — SODIUM CHLORIDE 0.9 % IV SOLN
1000.0000 mL | INTRAVENOUS | Status: DC
  Administered 2017-08-08: 1000 mL via INTRAVENOUS

## 2017-08-08 MED ORDER — METOCLOPRAMIDE HCL 5 MG/ML IJ SOLN
10.0000 mg | Freq: Once | INTRAMUSCULAR | Status: AC
  Administered 2017-08-08: 10 mg via INTRAVENOUS
  Filled 2017-08-08: qty 2

## 2017-08-08 MED ORDER — LORAZEPAM 1 MG PO TABS
1.0000 mg | ORAL_TABLET | Freq: Once | ORAL | Status: AC
  Administered 2017-08-08: 1 mg via ORAL
  Filled 2017-08-08: qty 1

## 2017-08-08 MED ORDER — ONDANSETRON 8 MG PO TBDP: 8.0000 mg | ORAL_TABLET | Freq: Three times a day (TID) | ORAL | 0 refills | Status: DC | PRN

## 2017-08-08 MED ORDER — SODIUM CHLORIDE 0.9 % IV BOLUS (SEPSIS)
1000.0000 mL | Freq: Once | INTRAVENOUS | Status: AC
  Administered 2017-08-08: 1000 mL via INTRAVENOUS

## 2017-08-08 NOTE — ED Triage Notes (Addendum)
Per [facility] pt coming from home hx of cyclic vomiting. Reports not being able to eat for past week due to nausea and vomiting. Patient ambulatory and NAD. Pt adds she is out of phenergan.

## 2017-08-08 NOTE — ED Notes (Signed)
Bed: WA07 Expected date:  Expected time:  Means of arrival:  Comments: 22 yo f, emesis, out of regular meds for cyclical vomiting

## 2017-08-08 NOTE — ED Notes (Signed)
Patient reports feeling anxious. MD notified and reports going to assess patient.

## 2017-08-09 NOTE — ED Provider Notes (Signed)
[facility]-EMERGENCY DEPT Provider Note   CSN: [id number] Arrival date & time: 08/08/17  1201     History   Chief Complaint Chief Complaint  Patient presents with  . Emesis    HPI Angela Christian is a 22 y.o. female.  HPI Patient is a 22 year old female with a history of nausea vomiting currently presents the emergency department with nausea vomiting over the past 4-5 days.  She is out of her Phenergan at home.  She reports decreased oral intake.  She feels weak and dehydrated.  She denies diarrhea.  No fevers or chills.  Denies new focal abdominal pain.  Reports crampy upper abdominal pain.  She has not seen her GI specialist in some time.  No other complaints at this time.  No dysuria or lower abdominal pain.  Denies vaginal complaints.  No back pain, chest pain, shortness of breath.   Past Medical History:  Diagnosis Date  . Anxiety   . Cyclical vomiting syndrome   . Depression   . Schizophrenia [facility])     Patient Active Problem List   Diagnosis Date Noted  . MDD (major depressive disorder) 09/10/2015  . Major depressive disorder, recurrent episode (HCC) 09/10/2015  . Polysubstance abuse (HCC) 09/10/2015  . Unspecified mood (affective) disorder (HCC) 09/10/2015  . Secondary amenorrhea 02/27/2015  . Cyclical vomiting syndrome 02/10/2015    Past Surgical History:  Procedure Laterality Date  . TONSILLECTOMY      OB History    Gravida Para Term Preterm AB Living   0 0 0 0 0 0   SAB TAB Ectopic Multiple Live Births   0 0 0 0         Home Medications    Prior to Admission medications   Medication Sig Start Date End Date Taking? Authorizing Provider  bismuth subsalicylate (PEPTO BISMOL) 262 MG/15ML suspension Take 30 mLs by mouth every 6 (six) hours as needed for indigestion or diarrhea or loose stools.   Yes [provider]  LORazepam (ATIVAN) 1 MG tablet Take 1 tablet (1 mg total) by mouth every 6 (six) hours as needed for  anxiety. Patient not taking: Reported on 08/08/2017 03/12/17   Mardella Layman, MD  metoCLOPramide (REGLAN) 10 MG tablet Take 1 tablet (10 mg total) by mouth every 6 (six) hours. 08/08/17   Azalia Bilis, MD  nortriptyline (PAMELOR) 10 MG capsule Take 1 capsule (10 mg total) by mouth at bedtime. Patient not taking: Reported on 08/08/2017 01/30/17   Mancel Bale, MD  ondansetron (ZOFRAN ODT) 8 MG disintegrating tablet Take 1 tablet (8 mg total) by mouth every 8 (eight) hours as needed for nausea or vomiting. 08/08/17   Azalia Bilis, MD    Family History Family History  Problem Relation Age of Onset  . Hypertension Mother     Social History Social History   Tobacco Use  . Smoking status: Current Every Day Smoker    Packs/day: 0.50    Types: Cigarettes  . Smokeless tobacco: Never Used  Substance Use Topics  . Alcohol use: Yes    Comment: varies from few shots of liquor to "whole bottle" daily  . Drug use: Yes    Types: Marijuana    Comment: daily     Allergies   Patient has no known allergies.   Review of Systems Review of Systems  All other systems reviewed and are negative.    Physical Exam Updated Vital Signs BP (!) 138/98 (BP Location: Left Arm)  Pulse (!) 104   Temp 98.1 F (36.7 C) (Oral)   Resp 15   LMP 08/08/2017   SpO2 100%   Physical Exam  Constitutional: She is oriented to person, place, and time. She appears well-developed and well-nourished. No distress.  HENT:  Head: Normocephalic and atraumatic.  Eyes: EOM are normal.  Neck: Normal range of motion.  Cardiovascular: Normal rate, regular rhythm and normal heart sounds.  Pulmonary/Chest: Effort normal and breath sounds normal.  Abdominal: Soft. She exhibits no distension. There is no tenderness.  Musculoskeletal: Normal range of motion.  Neurological: She is alert and oriented to person, place, and time.  Skin: Skin is warm and dry.  Psychiatric: She has a normal mood and affect. Judgment normal.    Nursing note and vitals reviewed.    ED Treatments / Results  Labs (all labs ordered are listed, but only abnormal results are displayed) Labs Reviewed  CBC - Abnormal; Notable for the following components:      Result Value   Hemoglobin 19.0 (*)    HCT 52.3 (*)    MCV 102.5 (*)    MCH 37.3 (*)    MCHC 36.3 (*)    All other components within normal limits  COMPREHENSIVE METABOLIC PANEL - Abnormal; Notable for the following components:   Chloride 97 (*)    Calcium 10.4 (*)    Total Protein 9.2 (*)    Albumin 5.4 (*)    Total Bilirubin 1.9 (*)    All other components within normal limits  I-STAT BETA HCG BLOOD, ED ([facility], [facility], [facility] ONLY)    EKG  EKG Interpretation None       Radiology No results found.  Procedures Procedures (including critical care time)  Medications Ordered in ED Medications  sodium chloride 0.9 % bolus 1,000 mL (0 mLs Intravenous Stopped 08/08/17 1409)  metoCLOPramide (REGLAN) injection 10 mg (10 mg Intravenous Given 08/08/17 1252)  LORazepam (ATIVAN) tablet 1 mg (1 mg Oral Given 08/08/17 1409)     Initial Impression / Assessment and Plan / ED Course  I have reviewed the triage vital signs and the nursing notes.  Pertinent labs & imaging results that were available during my care of the patient were reviewed by me and considered in my medical decision making (see chart for details).     Patient feels much better after IV fluids.  Tolerating oral fluids at this time.  Repeat abdominal exam without focal tenderness.  Patient would like to be discharged home.  Outpatient GI follow-up.  Home with Reglan and zofran.  She understands return to the emergency department for new or worsening symptoms.  Hemoglobin 19 likely secondary to concentration from dehydration.  Final Clinical Impressions(s) / ED Diagnoses   Final diagnoses:  Nausea and vomiting, intractability of vomiting not specified, unspecified vomiting type    ED Discharge Orders         Ordered    ondansetron (ZOFRAN ODT) 8 MG disintegrating tablet  Every 8 hours PRN     08/08/17 1551    metoCLOPramide (REGLAN) 10 MG tablet  Every 6 hours     08/08/17 1551       Azalia Bilisampos, Mckay Brandt, MD 08/09/17 2329

## 2017-10-11 ENCOUNTER — Inpatient Hospital Stay (HOSPITAL_COMMUNITY)
Admission: AD | Admit: 2017-10-11 | Discharge: 2017-10-11 | Disposition: A | Discharge status: Home / Self Care | Payer: Medicaid Other | Source: Ambulatory Visit | Attending: Family Medicine | Admitting: Family Medicine

## 2017-10-11 ENCOUNTER — Inpatient Hospital Stay (HOSPITAL_COMMUNITY): Payer: Medicaid Other

## 2017-10-11 ENCOUNTER — Encounter (HOSPITAL_COMMUNITY): Payer: Self-pay

## 2017-10-11 ENCOUNTER — Ambulatory Visit (HOSPITAL_COMMUNITY)
Admission: EM | Admit: 2017-10-11 | Discharge: 2017-10-11 | Disposition: A | Discharge status: Home / Self Care | Payer: Self-pay

## 2017-10-11 DIAGNOSIS — O3680X0 Pregnancy with inconclusive fetal viability, not applicable or unspecified: Secondary | ICD-10-CM

## 2017-10-11 DIAGNOSIS — O23591 Infection of other part of genital tract in pregnancy, first trimester: Secondary | ICD-10-CM | POA: Diagnosis not present

## 2017-10-11 DIAGNOSIS — B9689 Other specified bacterial agents as the cause of diseases classified elsewhere: Secondary | ICD-10-CM | POA: Diagnosis not present

## 2017-10-11 DIAGNOSIS — Z3A01 Less than 8 weeks gestation of pregnancy: Secondary | ICD-10-CM | POA: Diagnosis not present

## 2017-10-11 DIAGNOSIS — Z87891 Personal history of nicotine dependence: Secondary | ICD-10-CM | POA: Insufficient documentation

## 2017-10-11 DIAGNOSIS — N760 Acute vaginitis: Secondary | ICD-10-CM

## 2017-10-11 DIAGNOSIS — O209 Hemorrhage in early pregnancy, unspecified: Secondary | ICD-10-CM | POA: Diagnosis not present

## 2017-10-11 DIAGNOSIS — Z6790 Unspecified blood type, Rh positive: Secondary | ICD-10-CM

## 2017-10-11 DIAGNOSIS — O26851 Spotting complicating pregnancy, first trimester: Secondary | ICD-10-CM | POA: Diagnosis present

## 2017-10-11 DIAGNOSIS — O4691 Antepartum hemorrhage, unspecified, first trimester: Secondary | ICD-10-CM

## 2017-10-11 DIAGNOSIS — O4690 Antepartum hemorrhage, unspecified, unspecified trimester: Secondary | ICD-10-CM

## 2017-10-11 LAB — URINALYSIS, ROUTINE W REFLEX MICROSCOPIC
BACTERIA UA: NONE SEEN
BILIRUBIN URINE: NEGATIVE
Glucose, UA: NEGATIVE mg/dL
KETONES UR: 80 mg/dL — AB
LEUKOCYTES UA: NEGATIVE
Nitrite: NEGATIVE
PROTEIN: NEGATIVE mg/dL
SPECIFIC GRAVITY, URINE: 1.027 (ref 1.005–1.030)
pH: 5 (ref 5.0–8.0)

## 2017-10-11 LAB — POCT PREGNANCY, URINE: PREG TEST UR: POSITIVE — AB

## 2017-10-11 LAB — WET PREP, GENITAL
Sperm: NONE SEEN
Trich, Wet Prep: NONE SEEN
YEAST WET PREP: NONE SEEN

## 2017-10-11 LAB — HCG, QUANTITATIVE, PREGNANCY: hCG, Beta Chain, Quant, S: 183 m[IU]/mL — ABNORMAL HIGH (ref <5)

## 2017-10-11 LAB — CBC
HCT: 42.2 % (ref 36.0–46.0)
Hemoglobin: 14.8 g/dL (ref 12.0–15.0)
MCH: 37.1 pg — AB (ref 26.0–34.0)
MCHC: 35.1 g/dL (ref 30.0–36.0)
MCV: 105.8 fL — ABNORMAL HIGH (ref 78.0–100.0)
PLATELETS: 235 10*3/uL (ref 150–400)
RBC: 3.99 MIL/uL (ref 3.87–5.11)
RDW: 12.3 % (ref 11.5–15.5)
WBC: 8.8 10*3/uL (ref 4.0–10.5)

## 2017-10-11 LAB — ABO/RH: ABO/RH(D): A POS

## 2017-10-11 MED ORDER — METRONIDAZOLE 500 MG PO TABS
500.0000 mg | ORAL_TABLET | Freq: Two times a day (BID) | ORAL | 0 refills | Status: DC
Start: 2017-10-11 — End: 2017-11-26

## 2017-10-11 NOTE — ED Triage Notes (Signed)
[facility]

## 2017-10-11 NOTE — Discharge Instructions (Signed)
Bacterial Vaginosis Bacterial vaginosis is a vaginal infection that occurs when the normal balance of bacteria in the vagina is disrupted. It results from an overgrowth of certain bacteria. This is the most common vaginal infection among women ages 7-44. Because bacterial vaginosis increases your risk for STIs (sexually transmitted infections), getting treated can help reduce your risk for chlamydia, gonorrhea, herpes, and HIV (human immunodeficiency virus). Treatment is also important for preventing complications in pregnant women, because this condition can cause an early (premature) delivery. What are the causes? This condition is caused by an increase in harmful bacteria that are normally present in small amounts in the vagina. However, the reason that the condition develops is not fully understood. What increases the risk? The following factors may make you more likely to develop this condition:  Having a new sexual partner or multiple sexual partners.  Having unprotected sex.  Douching.  Having an intrauterine device (IUD).  Smoking.  Drug and alcohol abuse.  Taking certain antibiotic medicines.  Being pregnant.  You cannot get bacterial vaginosis from toilet seats, bedding, swimming pools, or contact with objects around you. What are the signs or symptoms? Symptoms of this condition include:  Grey or white vaginal discharge. The discharge can also be watery or foamy.  A fish-like odor with discharge, especially after sexual intercourse or during menstruation.  Itching in and around the vagina.  Burning or pain with urination.  Some women with bacterial vaginosis have no signs or symptoms. How is this diagnosed? This condition is diagnosed based on:  Your medical history.  A physical exam of the vagina.  Testing a sample of vaginal fluid under a microscope to look for a large amount of bad bacteria or abnormal cells. Your health care provider may use a cotton swab  or a small wooden spatula to collect the sample.  How is this treated? This condition is treated with antibiotics. These may be given as a pill, a vaginal cream, or a medicine that is put into the vagina (suppository). If the condition comes back after treatment, a second round of antibiotics may be needed. Follow these instructions at home: Medicines  Take over-the-counter and prescription medicines only as told by your health care provider.  Take or use your antibiotic as told by your health care provider. Do not stop taking or using the antibiotic even if you start to feel better. General instructions  If you have a female sexual partner, tell her that you have a vaginal infection. She should see her health care provider and be treated if she has symptoms. If you have a female sexual partner, he does not need treatment.  During treatment: ? Avoid sexual activity until you finish treatment. ? Do not douche. ? Avoid alcohol as directed by your health care provider. ? Avoid breastfeeding as directed by your health care provider.  Drink enough water and fluids to keep your urine clear or pale yellow.  Keep the area around your vagina and rectum clean. ? Wash the area daily with warm water. ? Wipe yourself from front to back after using the toilet.  Keep all follow-up visits as told by your health care provider. This is important. How is this prevented?  Do not douche.  Wash the outside of your vagina with warm water only.  Use protection when having sex. This includes latex condoms and dental dams.  Limit how many sexual partners you have. To help prevent bacterial vaginosis, it is best to have sex with just  one partner (monogamous).  Make sure you and your sexual partner are tested for STIs.  Wear cotton or cotton-lined underwear.  Avoid wearing tight pants and pantyhose, especially during summer.  Limit the amount of alcohol that you drink.  Do not use any products that  contain nicotine or tobacco, such as cigarettes and e-cigarettes. If you need help quitting, ask your health care provider.  Do not use illegal drugs. Where to find more information:  Centers for Disease Control and Prevention: SolutionApps.co.za  American Sexual Health Association (ASHA): www.ashastd.org  U.S. Department of Health and Health and safety inspector, Office on Women's Health: ConventionalMedicines.si or http://www.anderson-williamson.info/ Contact a health care provider if:  Your symptoms do not improve, even after treatment.  You have more discharge or pain when urinating.  You have a fever.  You have pain in your abdomen.  You have pain during sex.  You have vaginal bleeding between periods. Summary  Bacterial vaginosis is a vaginal infection that occurs when the normal balance of bacteria in the vagina is disrupted.  Because bacterial vaginosis increases your risk for STIs (sexually transmitted infections), getting treated can help reduce your risk for chlamydia, gonorrhea, herpes, and HIV (human immunodeficiency virus). Treatment is also important for preventing complications in pregnant women, because the condition can cause an early (premature) delivery.  This condition is treated with antibiotic medicines. These may be given as a pill, a vaginal cream, or a medicine that is put into the vagina (suppository). This information is not intended to replace advice given to you by your health care provider. Make sure you discuss any questions you have with your health care provider. Document Released: 07/05/2005 Document Revised: 11/08/2016 Document Reviewed: 03/20/2016 Elsevier Interactive Patient Education  2018 [employer].  Vaginal Bleeding During Pregnancy, First Trimester A small amount of bleeding (spotting) from the vagina is common in early pregnancy. Sometimes the bleeding is normal and is not a problem, and sometimes it is a sign of something serious.  Be sure to tell your doctor about any bleeding from your vagina right away. Follow these instructions at home:  Watch your condition for any changes.  Follow your doctor's instructions about how active you can be.  If you are on bed rest: ? You may need to stay in bed and only get up to use the bathroom. ? You may be allowed to do some activities. ? If you need help, make plans for someone to help you.  Write down: ? The number of pads you use each day. ? How often you change pads. ? How soaked (saturated) your pads are.  Do not use tampons.  Do not douche.  Do not have sex or orgasms until your doctor says it is okay.  If you pass any tissue from your vagina, save the tissue so you can show it to your doctor.  Only take medicines as told by your doctor.  Do not take aspirin because it can make you bleed.  Keep all follow-up visits as told by your doctor. Contact a doctor if:  You bleed from your vagina.  You have cramps.  You have labor pains.  You have a fever that does not go away after you take medicine. Get help right away if:  You have very bad cramps in your back or belly (abdomen).  You pass large clots or tissue from your vagina.  You bleed more.  You feel light-headed or weak.  You pass out (faint).  You have chills.  You  are leaking fluid or have a gush of fluid from your vagina.  You pass out while pooping (having a bowel movement). This information is not intended to replace advice given to you by your health care provider. Make sure you discuss any questions you have with your health care provider. Document Released: 11/19/2013 Document Revised: 12/11/2015 Document Reviewed: 03/12/2013 Elsevier Interactive Patient Education  [employer]2018 Elsevier Inc.

## 2017-10-11 NOTE — MAU Note (Signed)
Noticed blood on toilet paper after wiping. Was after BM and is unsure if from rectum or vagina. Some lower abdominal cramping. + HPT on Saturday.

## 2017-10-11 NOTE — MAU Provider Note (Signed)
History     CSN: [id number]  Arrival date and time: 10/11/17 1041   First Provider Initiated Contact with Patient 10/11/17 1147      Chief Complaint  Patient presents with  . Vaginal Bleeding  . Abdominal Pain   G1 @[redacted]w[redacted]d  by LMP here with spotting and mild abdominal cramping. She saw some pink on the toilet paper this am. Last IC was last night. Abdominal cramping is ongoing since last week. Describes as mild, and hasn't taken anything for it. No fevers. No urinary sx. No vaginal discharge, itching, or odor.    OB History    Gravida  1   Para  0   Term  0   Preterm  0   AB  0   Living  0     SAB  0   TAB  0   Ectopic  0   Multiple  0   Live Births              Past Medical History:  Diagnosis Date  . Anxiety   . Cyclical vomiting syndrome   . Depression   . Schizophrenia [facility])     Past Surgical History:  Procedure Laterality Date  . TONSILLECTOMY      Family History  Problem Relation Age of Onset  . Hypertension Mother     Social History   Tobacco Use  . Smoking status: Former Smoker    Packs/day: 0.50    Types: Cigarettes    Last attempt to quit: 10/08/2017  . Smokeless tobacco: Never Used  Substance Use Topics  . Alcohol use: Not Currently    Comment: varies from few shots of liquor to "whole bottle" daily  . Drug use: Not Currently    Types: Marijuana    Comment: daily    Allergies: No Known Allergies  Medications Prior to Admission  Medication Sig Dispense Refill Last Dose  . folic acid (FOLVITE) 800 MCG tablet Take 400 mcg by mouth daily.   10/10/2017 at Unknown time  . metoCLOPramide (REGLAN) 10 MG tablet Take 1 tablet (10 mg total) by mouth every 6 (six) hours. 15 tablet 0 Past Month at Unknown time  . nortriptyline (PAMELOR) 10 MG capsule Take 1 capsule (10 mg total) by mouth at bedtime. 30 capsule 0 Past Month at Unknown time  . Prenatal Vit-Fe Fumarate-FA (PRENATAL MULTIVITAMIN) TABS tablet Take 1 tablet by mouth daily at  12 noon.   10/10/2017 at Unknown time  . LORazepam (ATIVAN) 1 MG tablet Take 1 tablet (1 mg total) by mouth every 6 (six) hours as needed for anxiety. (Patient not taking: Reported on 08/08/2017) 10 tablet 0 Not Taking at Unknown time  . ondansetron (ZOFRAN ODT) 8 MG disintegrating tablet Take 1 tablet (8 mg total) by mouth every 8 (eight) hours as needed for nausea or vomiting. 10 tablet 0     Review of Systems  Constitutional: Positive for fever.  Gastrointestinal: Positive for abdominal pain. Negative for constipation, diarrhea, nausea and vomiting.  Genitourinary: Positive for vaginal bleeding. Negative for dysuria, frequency, hematuria, urgency and vaginal discharge.   Physical Exam   Blood pressure 121/72, pulse 94, temperature 98.5 F (36.9 C), temperature source Oral, resp. rate 18, height 5\' 5"  (1.651 m), weight 121 lb (54.9 kg), last menstrual period 09/09/2017.  Physical Exam  Constitutional: She is oriented to person, place, and time. She appears well-developed and well-nourished. No distress.  HENT:  Head: Normocephalic and atraumatic.  Neck: Normal range of motion.  Respiratory: Effort normal. No respiratory distress.  GI: Soft. She exhibits no distension and no mass. There is no tenderness. There is no rebound and no guarding.  Genitourinary:  Genitourinary Comments: External: no lesions or erythema Vagina: rugated, pink, moist, thin white malodorous discharge Uterus: non enlarged, anteverted, non tender, no CMT Adnexae: no masses, no tenderness left, no tenderness right    Musculoskeletal: Normal range of motion.  Neurological: She is alert and oriented to person, place, and time.  Skin: Skin is warm and dry.  Psychiatric: She has a normal mood and affect.   Results for orders placed or performed during the hospital encounter of 10/11/17 (from the past 24 hour(s))  Urinalysis, Routine w reflex microscopic     Status: Abnormal   Collection Time: 10/11/17 10:57 AM   Result Value Ref Range   Color, Urine YELLOW YELLOW   APPearance HAZY (A) CLEAR   Specific Gravity, Urine 1.027 1.005 - 1.030   pH 5.0 5.0 - 8.0   Glucose, UA NEGATIVE NEGATIVE mg/dL   Hgb urine dipstick MODERATE (A) NEGATIVE   Bilirubin Urine NEGATIVE NEGATIVE   Ketones, ur 80 (A) NEGATIVE mg/dL   Protein, ur NEGATIVE NEGATIVE mg/dL   Nitrite NEGATIVE NEGATIVE   Leukocytes, UA NEGATIVE NEGATIVE   RBC / HPF 0-5 0 - 5 RBC/hpf   WBC, UA 0-5 0 - 5 WBC/hpf   Bacteria, UA NONE SEEN NONE SEEN   Squamous Epithelial / LPF 0-5 (A) NONE SEEN   Mucus PRESENT    Hyaline Casts, UA PRESENT   Pregnancy, urine POC     Status: Abnormal   Collection Time: 10/11/17 11:29 AM  Result Value Ref Range   Preg Test, Ur POSITIVE (A) NEGATIVE  Wet prep, genital     Status: Abnormal   Collection Time: 10/11/17 11:40 AM  Result Value Ref Range   Yeast Wet Prep HPF POC NONE SEEN NONE SEEN   Trich, Wet Prep NONE SEEN NONE SEEN   Clue Cells Wet Prep HPF POC PRESENT (A) NONE SEEN   WBC, Wet Prep HPF POC FEW (A) NONE SEEN   Sperm NONE SEEN   ABO/Rh     Status: None (Preliminary result)   Collection Time: 10/11/17 12:07 PM  Result Value Ref Range   ABO/RH(D)      A POS Performed at [facility], [address], [place], [place] [address]   hCG, quantitative, pregnancy     Status: Abnormal   Collection Time: 10/11/17 12:07 PM  Result Value Ref Range   hCG, Beta Chain, Quant, S 183 (H) <5 mIU/mL  CBC     Status: Abnormal   Collection Time: 10/11/17 12:07 PM  Result Value Ref Range   WBC 8.8 4.0 - 10.5 K/uL   RBC 3.99 3.87 - 5.11 MIL/uL   Hemoglobin 14.8 12.0 - 15.0 g/dL   HCT 60.4 54.0 - 98.1 %   MCV 105.8 (H) 78.0 - 100.0 fL   MCH 37.1 (H) 26.0 - 34.0 pg   MCHC 35.1 30.0 - 36.0 g/dL   RDW 19.1 47.8 - 29.5 %   Platelets 235 150 - 400 K/uL   US Ob Less Than 14 Weeks With Ob Transvaginal  Result Date: 10/11/2017 CLINICAL DATA:  Vaginal bleeding for 1 day. Positive pregnancy test. Gestational  age by LMP of 4 weeks 4 days. EXAM: OBSTETRIC <14 WK [place] AND TRANSVAGINAL OB US TECHNIQUE: Both transabdominal and transvaginal ultrasound examinations were performed for complete evaluation of the gestation as well  as the maternal uterus, adnexal regions, and pelvic cul-de-sac. Transvaginal technique was performed to assess early pregnancy. COMPARISON:  None. FINDINGS: Intrauterine gestational sac: None Maternal uterus/adnexae: Endometrial thickness measures 12 mm. No fibroids identified. Both ovaries are normal in appearance. No adnexal mass or abnormal free fluid identified. IMPRESSION: Pregnancy of unknown anatomic location (no intrauterine gestational sac or adnexal mass identified). Differential diagnosis includes recent spontaneous abortion, IUP too early to visualize, and non-visualized ectopic pregnancy. Recommend correlation with serial beta-hCG levels, and follow up US if warranted clinically. Electronically Signed   By: Myles RosenthalJohn  Stahl M.D.   On: 10/11/2017 13:05   MAU Course  Procedures  MDM Labs and US ordered and reviewed. No IUP or adnexal mass seen on US, findings could indicate early pregnancy, ectopic pregnancy, or failed pregnancy-discussed with pt. Will follow quant in 48 hrs. Will treat BV. Stable for discharge home.  Assessment and Plan   1. Pregnancy of unknown anatomic location   2. Vaginal bleeding in pregnancy   3. Blood type, Rh positive   4. Bacterial vaginosis    Discharge home Follow up in [facility] on 10/13/17 at 8:30 am Rx Flagyl Ectopic/return precautions  Allergies as of 10/11/2017   No Known Allergies     Medication List    STOP taking these medications   LORazepam 1 MG tablet Commonly known as:  ATIVAN   nortriptyline 10 MG capsule Commonly known as:  PAMELOR     TAKE these medications   folic acid 800 MCG tablet Commonly known as:  FOLVITE Take 400 mcg by mouth daily.   metoCLOPramide 10 MG tablet Commonly known as:  REGLAN Take 1 tablet (10 mg total)  by mouth every 6 (six) hours.   metroNIDAZOLE 500 MG tablet Commonly known as:  FLAGYL Take 1 tablet (500 mg total) by mouth 2 (two) times daily.   ondansetron 8 MG disintegrating tablet Commonly known as:  ZOFRAN ODT Take 1 tablet (8 mg total) by mouth every 8 (eight) hours as needed for nausea or vomiting.   prenatal multivitamin Tabs tablet Take 1 tablet by mouth daily at 12 noon.      Donette LarryMelanie Bronislaus Verdell, CNM 10/11/2017, 1:21 PM

## 2017-10-12 ENCOUNTER — Inpatient Hospital Stay (HOSPITAL_COMMUNITY)
Admission: AD | Admit: 2017-10-12 | Discharge: 2017-10-12 | Disposition: A | Discharge status: Home / Self Care | Payer: Medicaid Other | Source: Ambulatory Visit | Attending: Obstetrics & Gynecology | Admitting: Obstetrics & Gynecology

## 2017-10-12 ENCOUNTER — Encounter (HOSPITAL_COMMUNITY): Payer: Self-pay

## 2017-10-12 DIAGNOSIS — O210 Mild hyperemesis gravidarum: Secondary | ICD-10-CM | POA: Insufficient documentation

## 2017-10-12 DIAGNOSIS — Z3A01 Less than 8 weeks gestation of pregnancy: Secondary | ICD-10-CM | POA: Insufficient documentation

## 2017-10-12 DIAGNOSIS — O219 Vomiting of pregnancy, unspecified: Secondary | ICD-10-CM

## 2017-10-12 DIAGNOSIS — Z87891 Personal history of nicotine dependence: Secondary | ICD-10-CM | POA: Diagnosis not present

## 2017-10-12 LAB — URINALYSIS, ROUTINE W REFLEX MICROSCOPIC
Bilirubin Urine: NEGATIVE
GLUCOSE, UA: 50 mg/dL — AB
Ketones, ur: 80 mg/dL — AB
Leukocytes, UA: NEGATIVE
NITRITE: NEGATIVE
PROTEIN: 100 mg/dL — AB
Specific Gravity, Urine: 1.032 — ABNORMAL HIGH (ref 1.005–1.030)
pH: 5 (ref 5.0–8.0)

## 2017-10-12 LAB — RAPID URINE DRUG SCREEN, HOSP PERFORMED
AMPHETAMINES: NOT DETECTED
Barbiturates: NOT DETECTED
Benzodiazepines: NOT DETECTED
Cocaine: NOT DETECTED
OPIATES: NOT DETECTED
TETRAHYDROCANNABINOL: POSITIVE — AB

## 2017-10-12 LAB — GC/CHLAMYDIA PROBE AMP (~~LOC~~) NOT AT ARMC
Chlamydia: NEGATIVE
Neisseria Gonorrhea: NEGATIVE

## 2017-10-12 LAB — POCT PREGNANCY, URINE: Preg Test, Ur: POSITIVE — AB

## 2017-10-12 MED ORDER — PROMETHAZINE HCL 25 MG PO TABS: 12.5000 mg | ORAL_TABLET | Freq: Four times a day (QID) | ORAL | 0 refills | Status: DC | PRN

## 2017-10-12 MED ORDER — PROMETHAZINE HCL 25 MG/ML IJ SOLN
25.0000 mg | Freq: Once | INTRAVENOUS | Status: AC
  Administered 2017-10-12: 25 mg via INTRAVENOUS
  Filled 2017-10-12: qty 1

## 2017-10-12 NOTE — MAU Provider Note (Signed)
History     CSN: [id number]  Arrival date and time: 10/12/17 1756   First Provider Initiated Contact with Patient 10/12/17 1955      Chief Complaint  Patient presents with  . Emesis  . Nausea   Angela Christian is a 22 y.o. G1P0000 at [redacted]w[redacted]d who presents today with nausea and vomiting. She was seen here yesterday, but reports that this did not start until today. She has an appt in the clinic tomorrow for repeat HCG. She denies any VB or pain today. She denies any fever, diarrhea or sick contact.   Emesis   This is a new problem. The current episode started today (started at 0600). The problem occurs more than 10 times per day. The problem has been unchanged. The emesis has an appearance of stomach contents. There has been no fever. Pertinent negatives include no chills, diarrhea or fever. Risk factors: pregnancy  She has tried nothing for the symptoms.   Past Medical History:  Diagnosis Date  . Anxiety   . Cyclical vomiting syndrome   . Depression   . Schizophrenia [facility])     Past Surgical History:  Procedure Laterality Date  . TONSILLECTOMY      Family History  Problem Relation Age of Onset  . Hypertension Mother     Social History   Tobacco Use  . Smoking status: Former Smoker    Packs/day: 0.50    Types: Cigarettes    Last attempt to quit: 10/08/2017    Years since quitting: 0.0  . Smokeless tobacco: Never Used  Substance Use Topics  . Alcohol use: Not Currently    Comment: varies from few shots of liquor to "whole bottle" daily  . Drug use: Not Currently    Types: Marijuana    Comment: daily    Allergies: No Known Allergies  Medications Prior to Admission  Medication Sig Dispense Refill Last Dose  . folic acid (FOLVITE) 800 MCG tablet Take 400 mcg by mouth daily.   10/10/2017 at Unknown time  . metoCLOPramide (REGLAN) 10 MG tablet Take 1 tablet (10 mg total) by mouth every 6 (six) hours. 15 tablet 0 Past Month at Unknown time  . metroNIDAZOLE (FLAGYL) 500  MG tablet Take 1 tablet (500 mg total) by mouth 2 (two) times daily. 14 tablet 0   . ondansetron (ZOFRAN ODT) 8 MG disintegrating tablet Take 1 tablet (8 mg total) by mouth every 8 (eight) hours as needed for nausea or vomiting. 10 tablet 0   . Prenatal Vit-Fe Fumarate-FA (PRENATAL MULTIVITAMIN) TABS tablet Take 1 tablet by mouth daily at 12 noon.   10/10/2017 at Unknown time    Review of Systems  Constitutional: Negative for chills and fever.  Gastrointestinal: Positive for nausea and vomiting. Negative for diarrhea.  Genitourinary: Negative for dysuria, pelvic pain, vaginal bleeding and vaginal discharge.   Physical Exam   Blood pressure 125/78, pulse 90, temperature 98.2 F (36.8 C), resp. rate 20, height 5\' 5"  (1.651 m), weight 114 lb (51.7 kg), last menstrual period 09/09/2017, SpO2 100 %.  Physical Exam  Nursing note and vitals reviewed. Constitutional: She is oriented to person, place, and time. She appears well-developed and well-nourished. No distress.  HENT:  Head: Normocephalic.  Cardiovascular: Normal rate.  Respiratory: Effort normal and breath sounds normal.  GI: Soft. Bowel sounds are normal. There is no tenderness. There is no rebound.  Neurological: She is alert and oriented to person, place, and time.  Skin: Skin is warm and  dry.  Psychiatric: She has a normal mood and affect.   Results for orders placed or performed during the hospital encounter of 10/12/17 (from the past 24 hour(s))  Pregnancy, urine POC     Status: Abnormal   Collection Time: 10/12/17  6:16 PM  Result Value Ref Range   Preg Test, Ur POSITIVE (A) NEGATIVE  Urinalysis, Routine w reflex microscopic     Status: Abnormal   Collection Time: 10/12/17  9:23 PM  Result Value Ref Range   Color, Urine AMBER (A) YELLOW   APPearance HAZY (A) CLEAR   Specific Gravity, Urine 1.032 (H) 1.005 - 1.030   pH 5.0 5.0 - 8.0   Glucose, UA 50 (A) NEGATIVE mg/dL   Hgb urine dipstick MODERATE (A) NEGATIVE    Bilirubin Urine NEGATIVE NEGATIVE   Ketones, ur 80 (A) NEGATIVE mg/dL   Protein, ur 952100 (A) NEGATIVE mg/dL   Nitrite NEGATIVE NEGATIVE   Leukocytes, UA NEGATIVE NEGATIVE   RBC / HPF 6-30 0 - 5 RBC/hpf   WBC, UA 0-5 0 - 5 WBC/hpf   Bacteria, UA RARE (A) NONE SEEN   Squamous Epithelial / LPF 6-30 (A) NONE SEEN   Mucus PRESENT     MAU Course  Procedures  MDM Patient has had 1L or LR and 25mg  phenergan She is feeling better. She has not vomited since being here  Assessment and Plan   1. Nausea/vomiting in pregnancy   2. [redacted] weeks gestation of pregnancy    DC home Comfort measures reviewed  1st Trimester precautions  RX: phenergan 25mg  PRN #30  Return to MAU as needed FU with OB as planned  Follow-up Information    [facility]   Specialty:  Obstetrics and Gynecology Why:  Please keep your appointment for tomorrow  Contact information: [address] [place] [id number] [phone]           Thressa ShellerHeather Yania Bogie 10/12/2017, 7:57 PM

## 2017-10-12 NOTE — Discharge Instructions (Signed)
Morning Sickness °Morning sickness is when you feel sick to your stomach (nauseous) during pregnancy. This nauseous feeling may or may not come with vomiting. It often occurs in the morning but can be a problem any time of day. Morning sickness is most common during the first trimester, but it may continue throughout pregnancy. While morning sickness is unpleasant, it is usually harmless unless you develop severe and continual vomiting (hyperemesis gravidarum). This condition requires more intense treatment. °What are the causes? °The cause of morning sickness is not completely known but seems to be related to normal hormonal changes that occur in pregnancy. °What increases the risk? °You are at greater risk if you: °· Experienced nausea or vomiting before your pregnancy. °· Had morning sickness during a previous pregnancy. °· Are pregnant with more than one baby, such as twins. ° °How is this treated? °Do not use any medicines (prescription, over-the-counter, or herbal) for morning sickness without first talking to your health care provider. Your health care provider may prescribe or recommend: °· Vitamin B6 supplements. °· Anti-nausea medicines. °· The herbal medicine ginger. ° °Follow these instructions at home: °· Only take over-the-counter or prescription medicines as directed by your health care provider. °· Taking multivitamins before getting pregnant can prevent or decrease the severity of morning sickness in most women. °· Eat a piece of dry toast or unsalted crackers before getting out of bed in the morning. °· Eat five or six small meals a day. °· Eat dry and bland foods (rice, baked potato). Foods high in carbohydrates are often helpful. °· Do not drink liquids with your meals. Drink liquids between meals. °· Avoid greasy, fatty, and spicy foods. °· Get someone to cook for you if the smell of any food causes nausea and vomiting. °· If you feel nauseous after taking prenatal vitamins, take the vitamins at  night or with a snack. °· Snack on protein foods (nuts, yogurt, cheese) between meals if you are hungry. °· Eat unsweetened gelatins for desserts. °· Wearing an acupressure wristband (worn for sea sickness) may be helpful. °· Acupuncture may be helpful. °· Do not smoke. °· Get a humidifier to keep the air in your house free of odors. °· Get plenty of fresh air. °Contact a health care provider if: °· Your home remedies are not working, and you need medicine. °· You feel dizzy or lightheaded. °· You are losing weight. °Get help right away if: °· You have persistent and uncontrolled nausea and vomiting. °· You pass out (faint). °This information is not intended to replace advice given to you by your health care provider. Make sure you discuss any questions you have with your health care provider. °Document Released: 08/26/2006 Document Revised: 12/11/2015 Document Reviewed: 12/20/2012 °Elsevier Interactive Patient Education © 2017 Elsevier Inc. ° °

## 2017-10-12 NOTE — MAU Note (Signed)
Pt reports vomiting since this am, lower back pain. Was seen here yesterday for spotting . None today.

## 2017-10-13 ENCOUNTER — Ambulatory Visit (INDEPENDENT_AMBULATORY_CARE_PROVIDER_SITE_OTHER): Payer: Self-pay | Admitting: General Practice

## 2017-10-13 DIAGNOSIS — Z3490 Encounter for supervision of normal pregnancy, unspecified, unspecified trimester: Secondary | ICD-10-CM

## 2017-10-13 DIAGNOSIS — O3680X0 Pregnancy with inconclusive fetal viability, not applicable or unspecified: Secondary | ICD-10-CM

## 2017-10-13 LAB — HCG, QUANTITATIVE, PREGNANCY: hCG, Beta Chain, Quant, S: 439 m[IU]/mL — ABNORMAL HIGH (ref <5)

## 2017-10-13 NOTE — Progress Notes (Signed)
Reviewed results with Judeth HornErin Lawrence, who finds appropriate rise in bhcg levels- patient should have repeat ultrasound in 2 weeks. Scheduled for 4/11.   Informed patient of results & scheduled ultrasound appt. Ectopic precautions reviewed. Patient verbalized understanding to all & had no questions at this time.

## 2017-10-13 NOTE — Progress Notes (Signed)
Patient presents to clinic for stat beta hcg. Having no pain or bleeding at this time but is nauseous. Has not vomited since yesterday. Explained the reason and importance of the test and that she will need to remain present to go over results and have an updated plan of care. Understanding was voiced.

## 2017-10-13 NOTE — Progress Notes (Signed)
Chart reviewed for nurse visit. Agree with plan of care.   Judeth HornLawrence, Amarionna Arca, NP 10/13/2017 11:27 AM

## 2017-10-17 ENCOUNTER — Inpatient Hospital Stay (HOSPITAL_COMMUNITY)
Admission: AD | Admit: 2017-10-17 | Discharge: 2017-10-17 | Disposition: A | Discharge status: Home / Self Care | Payer: Medicaid Other | Source: Ambulatory Visit | Attending: Obstetrics & Gynecology | Admitting: Obstetrics & Gynecology

## 2017-10-17 DIAGNOSIS — Z711 Person with feared health complaint in whom no diagnosis is made: Secondary | ICD-10-CM | POA: Diagnosis not present

## 2017-10-17 DIAGNOSIS — Z3A01 Less than 8 weeks gestation of pregnancy: Secondary | ICD-10-CM

## 2017-10-17 DIAGNOSIS — R030 Elevated blood-pressure reading, without diagnosis of hypertension: Secondary | ICD-10-CM | POA: Diagnosis present

## 2017-10-17 NOTE — MAU Provider Note (Signed)
[name] is a 22 y.o. G1P0000 at 4863w3d who presents to MAU today for cDorcas Mcmurrayoncern about her BP. [facility] 155/101. She has no complaints. Feels well.  BP 112/84 (BP Location: Right Arm)   Pulse (!) 110   Temp 98.7 F (37.1 C) (Oral)   Resp 18   Ht 5\' 4"  (1.626 m)   Wt 113 lb (51.3 kg)   LMP 09/09/2017   SpO2 99%   BMI 19.40 kg/m   CONSTITUTIONAL: Well-developed, well-nourished female in no acute distress.  MUSCULOSKELETAL: Normal range of motion.  CARDIOVASCULAR: Regular heart rate RESPIRATORY: Normal effort NEUROLOGICAL: Alert and oriented to person, place, and time.  SKIN: No pallor. PSYCH: Normal mood and affect. Normal behavior. Normal judgment and thought content.  No results found for this or any previous visit (from the past 24 hour(s)).  MDM Normotensive. No emergency identified.  A: [redacted] weeks gestation Worried well  P: Discharge home Patient may return to MAU as needed or if her condition were to change or worsen   Donette LarryBhambri, Kristilyn Coltrane, [place]  10/17/2017 8:10 PM

## 2017-10-17 NOTE — MAU Note (Addendum)
Pt states she has HBP. [facility]-has pictures of what BP's were on phone which is with boyfriend in the lobby. No other complaints.

## 2017-10-27 ENCOUNTER — Ambulatory Visit (HOSPITAL_COMMUNITY)
Admission: RE | Admit: 2017-10-27 | Discharge: 2017-10-27 | Disposition: A | Discharge status: Home / Self Care | Payer: Medicaid Other | Source: Ambulatory Visit | Attending: Student | Admitting: Student

## 2017-10-27 ENCOUNTER — Ambulatory Visit (INDEPENDENT_AMBULATORY_CARE_PROVIDER_SITE_OTHER): Payer: Medicaid Other | Admitting: General Practice

## 2017-10-27 DIAGNOSIS — O3481 Maternal care for other abnormalities of pelvic organs, first trimester: Secondary | ICD-10-CM | POA: Diagnosis not present

## 2017-10-27 DIAGNOSIS — O3680X0 Pregnancy with inconclusive fetal viability, not applicable or unspecified: Secondary | ICD-10-CM

## 2017-10-27 DIAGNOSIS — Z3A01 Less than 8 weeks gestation of pregnancy: Secondary | ICD-10-CM | POA: Diagnosis not present

## 2017-10-27 DIAGNOSIS — Z369 Encounter for antenatal screening, unspecified: Secondary | ICD-10-CM | POA: Diagnosis present

## 2017-10-27 DIAGNOSIS — N8311 Corpus luteum cyst of right ovary: Secondary | ICD-10-CM | POA: Insufficient documentation

## 2017-10-27 DIAGNOSIS — Z712 Person consulting for explanation of examination or test findings: Secondary | ICD-10-CM

## 2017-10-27 NOTE — Progress Notes (Signed)
I have reviewed the chart and agree with nursing staff's documentation of this patient's encounter.  Shyloh Krinke, MD 10/27/2017 1:17 PM    

## 2017-10-27 NOTE — Progress Notes (Signed)
Patient here for viability results today. Reviewed with Dr Vergie LivingPickens who finds living IUP- patient should begin prenatal care. Informed patient of results & reviewed dating. Recommended she begin OB care around 11-12 weeks. Patient verbalized understanding.

## 2017-11-26 ENCOUNTER — Inpatient Hospital Stay (HOSPITAL_COMMUNITY)
Admission: AD | Admit: 2017-11-26 | Discharge: 2017-11-26 | Disposition: A | Discharge status: Home / Self Care | Payer: Medicaid Other | Source: Ambulatory Visit | Attending: Family Medicine | Admitting: Family Medicine

## 2017-11-26 ENCOUNTER — Encounter (HOSPITAL_COMMUNITY): Payer: Self-pay

## 2017-11-26 DIAGNOSIS — O219 Vomiting of pregnancy, unspecified: Secondary | ICD-10-CM | POA: Diagnosis not present

## 2017-11-26 DIAGNOSIS — O26891 Other specified pregnancy related conditions, first trimester: Secondary | ICD-10-CM | POA: Insufficient documentation

## 2017-11-26 DIAGNOSIS — Z87891 Personal history of nicotine dependence: Secondary | ICD-10-CM | POA: Insufficient documentation

## 2017-11-26 DIAGNOSIS — Z79899 Other long term (current) drug therapy: Secondary | ICD-10-CM | POA: Diagnosis not present

## 2017-11-26 DIAGNOSIS — Z8249 Family history of ischemic heart disease and other diseases of the circulatory system: Secondary | ICD-10-CM | POA: Insufficient documentation

## 2017-11-26 DIAGNOSIS — Z3A11 11 weeks gestation of pregnancy: Secondary | ICD-10-CM | POA: Diagnosis not present

## 2017-11-26 DIAGNOSIS — K117 Disturbances of salivary secretion: Secondary | ICD-10-CM | POA: Insufficient documentation

## 2017-11-26 DIAGNOSIS — Z9889 Other specified postprocedural states: Secondary | ICD-10-CM | POA: Diagnosis not present

## 2017-11-26 DIAGNOSIS — R109 Unspecified abdominal pain: Secondary | ICD-10-CM | POA: Insufficient documentation

## 2017-11-26 LAB — CBC
HCT: 41.6 % (ref 36.0–46.0)
HEMOGLOBIN: 15.1 g/dL — AB (ref 12.0–15.0)
MCH: 35.7 pg — ABNORMAL HIGH (ref 26.0–34.0)
MCHC: 36.3 g/dL — ABNORMAL HIGH (ref 30.0–36.0)
MCV: 98.3 fL (ref 78.0–100.0)
PLATELETS: 311 10*3/uL (ref 150–400)
RBC: 4.23 MIL/uL (ref 3.87–5.11)
RDW: 10.8 % — ABNORMAL LOW (ref 11.5–15.5)
WBC: 12.7 10*3/uL — AB (ref 4.0–10.5)

## 2017-11-26 LAB — COMPREHENSIVE METABOLIC PANEL
ALBUMIN: 4.3 g/dL (ref 3.5–5.0)
ALK PHOS: 39 U/L (ref 38–126)
ALT: 21 U/L (ref 14–54)
AST: 19 U/L (ref 15–41)
Anion gap: 15 (ref 5–15)
BUN: 10 mg/dL (ref 6–20)
CALCIUM: 10.2 mg/dL (ref 8.9–10.3)
CHLORIDE: 101 mmol/L (ref 101–111)
CO2: 19 mmol/L — AB (ref 22–32)
CREATININE: 0.63 mg/dL (ref 0.44–1.00)
GFR calc Af Amer: >60 mL/min (ref >60)
GFR calc non Af Amer: >60 mL/min (ref >60)
GLUCOSE: 80 mg/dL (ref 65–99)
Potassium: 3.6 mmol/L (ref 3.5–5.1)
SODIUM: 135 mmol/L (ref 135–145)
Total Bilirubin: 1.4 mg/dL — ABNORMAL HIGH (ref 0.3–1.2)
Total Protein: 7.6 g/dL (ref 6.5–8.1)

## 2017-11-26 LAB — URINALYSIS, ROUTINE W REFLEX MICROSCOPIC
BACTERIA UA: NONE SEEN
Bilirubin Urine: NEGATIVE
Glucose, UA: NEGATIVE mg/dL
Hgb urine dipstick: NEGATIVE
Ketones, ur: 80 mg/dL — AB
Leukocytes, UA: NEGATIVE
Nitrite: NEGATIVE
Protein, ur: 100 mg/dL — AB
Specific Gravity, Urine: 1.034 — ABNORMAL HIGH (ref 1.005–1.030)
pH: 6 (ref 5.0–8.0)

## 2017-11-26 MED ORDER — LACTATED RINGERS IV BOLUS
1000.0000 mL | Freq: Once | INTRAVENOUS | Status: AC
  Administered 2017-11-26: 1000 mL via INTRAVENOUS

## 2017-11-26 MED ORDER — GLYCOPYRROLATE 1 MG PO TABS: 1.0000 mg | ORAL_TABLET | Freq: Three times a day (TID) | ORAL | 0 refills | Status: DC

## 2017-11-26 MED ORDER — METOCLOPRAMIDE HCL 5 MG/ML IJ SOLN
10.0000 mg | Freq: Once | INTRAMUSCULAR | Status: AC
  Administered 2017-11-26: 10 mg via INTRAVENOUS
  Filled 2017-11-26: qty 2

## 2017-11-26 MED ORDER — GLYCOPYRROLATE 0.2 MG/ML IJ SOLN
0.1000 mg | Freq: Once | INTRAMUSCULAR | Status: AC
  Administered 2017-11-26: 0.1 mg via INTRAVENOUS
  Filled 2017-11-26: qty 0.5

## 2017-11-26 MED ORDER — METOCLOPRAMIDE HCL 10 MG PO TABS: 10.0000 mg | ORAL_TABLET | Freq: Four times a day (QID) | ORAL | 1 refills | Status: DC

## 2017-11-26 MED ORDER — M.V.I. ADULT IV INJ
Freq: Once | INTRAVENOUS | Status: AC
  Administered 2017-11-26: 20:00:00 via INTRAVENOUS
  Filled 2017-11-26: qty 10

## 2017-11-26 NOTE — Discharge Instructions (Signed)
[facility] [employer] [facility] [employer] at [facility]       Phone: [phone]  Center for [employer] at [employer] Phone: [phone]  Center for [employer] at [place]  Phone: [phone]  [facility] at [employer]  Phone: [phone]  Center for [facility] [place]  Phone: [phone]  [facility]       Phone: [phone]  [facility]    Phone: [phone]   [facility] [place]    Phone: [phone]  [facility]    Phone: [phone]  [facility]    Phone: [phone]  [facility]    Phone: [phone]  [facility]       Phone: [phone]   [facility]  Phone: [phone]  [facility]    Phone: [phone]  [facility]   Phone: [phone]  [facility]      Phone: [phone]  [facility]    Phone: [phone]  Safe Medications in Pregnancy   Acne: Benzoyl Peroxide Salicylic Acid  Backache/Headache: Tylenol: 2 regular strength every 4 hours OR              2 Extra strength every 6 hours  Colds/Coughs/Allergies: Benadryl (alcohol free) 25 mg every 6 hours as needed Breath right strips Claritin Cepacol throat lozenges Chloraseptic throat spray Cold-Eeze- up to three times per day Cough drops, alcohol free Flonase (by prescription only) Guaifenesin Mucinex Robitussin DM (plain only, alcohol free) Saline nasal spray/drops Sudafed (pseudoephedrine) & Actifed ** use only after [redacted] weeks gestation and if you do not have high blood pressure Tylenol Vicks Vaporub Zinc lozenges Zyrtec   Constipation: Colace Ducolax suppositories Fleet enema Glycerin suppositories Metamucil Milk of  magnesia Miralax Senokot Smooth move tea  Diarrhea: Kaopectate Imodium A-D  *NO pepto Bismol  Hemorrhoids: Anusol Anusol HC Preparation H Tucks  Indigestion: Tums Maalox Mylanta Zantac  Pepcid  Insomnia: Benadryl (alcohol free)  every 6 hours as needed Tylenol PM Unisom, no Gelcaps  Leg Cramps: Tums MagGel  Nausea/Vomiting:  Bonine Dramamine Emetrol Ginger extract Sea bands Meclizine  Nausea medication to take during pregnancy:  Unisom (doxylamine succinate 25 mg tablets) Take one tablet daily at bedtime. If symptoms are not adequately controlled, the dose can be increased to a maximum recommended dose of two tablets daily (1/2 tablet in the morning, 1/2 tablet mid-afternoon and one at bedtime). Vitamin B6  tablets. Take one tablet twice a day (up to 200 mg per day).  Skin Rashes: Aveeno products Benadryl cream or  every 6 hours as needed Calamine Lotion 1% cortisone cream  Yeast infection: Gyne-lotrimin 7 Monistat 7   **If taking multiple medications, please check labels to avoid duplicating the same active ingredients **take medication as directed on the label ** Do not exceed 4000 mg of tylenol in 24 hours **Do not take medications that contain aspirin or ibuprofen      Morning Sickness Morning sickness is when you feel sick to your stomach (nauseous) during pregnancy. This nauseous feeling may or may not come with vomiting. It often occurs in the morning but can be a problem any time of day. Morning sickness is most common during the first trimester, but it may continue throughout pregnancy. While morning sickness is unpleasant, it is usually harmless unless you develop severe and continual vomiting (hyperemesis gravidarum). This condition requires  more intense treatment. What are the causes? The cause of morning sickness is not completely known but seems to be related to normal hormonal changes that occur in pregnancy. What  increases the risk? You are at greater risk if you:  Experienced nausea or vomiting before your pregnancy.  Had morning sickness during a previous pregnancy.  Are pregnant with more than one baby, such as twins.  How is this treated? Do not use any medicines (prescription, over-the-counter, or herbal) for morning sickness without first talking to your health care provider. Your health care provider may prescribe or recommend:  Vitamin B6 supplements.  Anti-nausea medicines.  The herbal medicine ginger.  Follow these instructions at home:  Only take over-the-counter or prescription medicines as directed by your health care provider.  Taking multivitamins before getting pregnant can prevent or decrease the severity of morning sickness in most women.  Eat a piece of dry toast or unsalted crackers before getting out of bed in the morning.  Eat five or six small meals a day.  Eat dry and bland foods (rice, baked potato). Foods high in carbohydrates are often helpful.  Do not drink liquids with your meals. Drink liquids between meals.  Avoid greasy, fatty, and spicy foods.  Get someone to cook for you if the smell of any food causes nausea and vomiting.  If you feel nauseous after taking prenatal vitamins, take the vitamins at night or with a snack.  Snack on protein foods (nuts, yogurt, cheese) between meals if you are hungry.  Eat unsweetened gelatins for desserts.  Wearing an acupressure wristband (worn for sea sickness) may be helpful.  Acupuncture may be helpful.  Do not smoke.  Get a humidifier to keep the air in your house free of odors.  Get plenty of fresh air. Contact a health care provider if:  Your home remedies are not working, and you need medicine.  You feel dizzy or lightheaded.  You are losing weight. Get help right away if:  You have persistent and uncontrolled nausea and vomiting.  You pass out (faint). This information is not intended to  replace advice given to you by your health care provider. Make sure you discuss any questions you have with your health care provider. Document Released: 08/26/2006 Document Revised: 12/11/2015 Document Reviewed: 12/20/2012 Elsevier Interactive Patient Education  2017 [employer].

## 2017-11-26 NOTE — MAU Provider Note (Addendum)
History     CSN: [id number]  Arrival date and time: 11/26/17 4098   First Provider Initiated Contact with Patient 11/26/17 1814      Chief Complaint  Patient presents with  . Emesis  . Abdominal Pain   G1 .1 wks here with N/V. Sx started 5 days ago. She cannot tolerate anything po. Denies fever, diarrhea, and sick contacts. She was using Phenergan po but ran out. Also reports excessive salivation and spitting. Denies VB or cramping. Having some "tightening" pain when she vomits. She admits to using Marijuana to improve her appetite and nausea, last use this am.   OB History    Gravida  1   Para  0   Term  0   Preterm  0   AB  0   Living  0     SAB  0   TAB  0   Ectopic  0   Multiple  0   Live Births              Past Medical History:  Diagnosis Date  . Anxiety   . Cyclical vomiting syndrome   . Depression   . Schizophrenia [facility])     Past Surgical History:  Procedure Laterality Date  . TONSILLECTOMY      Family History  Problem Relation Age of Onset  . Hypertension Mother     Social History   Tobacco Use  . Smoking status: Former Smoker    Packs/day: 0.00    Types: Cigarettes    Last attempt to quit: 10/08/2017    Years since quitting: 0.1  . Smokeless tobacco: Never Used  Substance Use Topics  . Alcohol use: Not Currently    Comment: varies from few shots of liquor to "whole bottle" daily  . Drug use: Yes    Types: Marijuana    Comment: daily    Allergies: No Known Allergies  Medications Prior to Admission  Medication Sig Dispense Refill Last Dose  . folic acid (FOLVITE) 800 MCG tablet Take 400 mcg by mouth daily.   10/10/2017 at Unknown time  . metoCLOPramide (REGLAN) 10 MG tablet Take 1 tablet (10 mg total) by mouth every 6 (six) hours. 15 tablet 0 Past Month at Unknown time  . metroNIDAZOLE (FLAGYL) 500 MG tablet Take 1 tablet (500 mg total) by mouth 2 (two) times daily. 14 tablet 0   . ondansetron (ZOFRAN ODT) 8 MG  disintegrating tablet Take 1 tablet (8 mg total) by mouth every 8 (eight) hours as needed for nausea or vomiting. 10 tablet 0   . Prenatal Vit-Fe Fumarate-FA (PRENATAL MULTIVITAMIN) TABS tablet Take 1 tablet by mouth daily at 12 noon.   10/10/2017 at Unknown time  . promethazine (PHENERGAN) 25 MG tablet Take 0.5-1 tablets (12.5-25 mg total) by mouth every 6 (six) hours as needed. 30 tablet 0    Review of Systems  Constitutional: Negative for chills and fever.  Gastrointestinal: Positive for nausea and vomiting. Negative for abdominal pain, constipation and diarrhea.  Genitourinary: Negative for vaginal bleeding and vaginal discharge.   Physical Exam   Blood pressure 115/78, pulse 90, temperature 98.4 F (36.9 C), temperature source Oral, resp. rate 16, weight 112 lb 0.6 oz (50.8 kg), last menstrual period 09/09/2017, SpO2 100 %.  Physical Exam  Constitutional: She is oriented to person, place, and time. She appears well-developed and well-nourished. No distress.  HENT:  Head: Normocephalic and atraumatic.  Neck: Normal range of motion.  Cardiovascular: Normal rate.  Respiratory: Effort normal. No respiratory distress.  GI: Soft. She exhibits no distension and no mass. There is no tenderness. There is no rebound and no guarding.  Musculoskeletal: Normal range of motion.  Neurological: She is alert and oriented to person, place, and time.  Skin: Skin is warm and dry.  Psychiatric: She has a normal mood and affect.   Results for orders placed or performed during the hospital encounter of 11/26/17 (from the past 24 hour(s))  Urinalysis, Routine w reflex microscopic     Status: Abnormal   Collection Time: 11/26/17  5:25 PM  Result Value Ref Range   Color, Urine AMBER (A) YELLOW   APPearance HAZY (A) CLEAR   Specific Gravity, Urine 1.034 (H) 1.005 - 1.030   pH 6.0 5.0 - 8.0   Glucose, UA NEGATIVE NEGATIVE mg/dL   Hgb urine dipstick NEGATIVE NEGATIVE   Bilirubin Urine NEGATIVE NEGATIVE    Ketones, ur 80 (A) NEGATIVE mg/dL   Protein, ur 161 (A) NEGATIVE mg/dL   Nitrite NEGATIVE NEGATIVE   Leukocytes, UA NEGATIVE NEGATIVE   RBC / HPF 6-10 0 - 5 RBC/hpf   WBC, UA 11-20 0 - 5 WBC/hpf   Bacteria, UA NONE SEEN NONE SEEN   Squamous Epithelial / LPF 11-20 0 - 5   Mucus PRESENT   CBC     Status: Abnormal   Collection Time: 11/26/17  6:40 PM  Result Value Ref Range   WBC 12.7 (H) 4.0 - 10.5 K/uL   RBC 4.23 3.87 - 5.11 MIL/uL   Hemoglobin 15.1 (H) 12.0 - 15.0 g/dL   HCT 09.6 04.5 - 40.9 %   MCV 98.3 78.0 - 100.0 fL   MCH 35.7 (H) 26.0 - 34.0 pg   MCHC 36.3 (H) 30.0 - 36.0 g/dL   RDW 81.1 (L) 91.4 - 78.2 %   Platelets 311 150 - 400 K/uL  Comprehensive metabolic panel     Status: Abnormal   Collection Time: 11/26/17  6:40 PM  Result Value Ref Range   Sodium 135 135 - 145 mmol/L   Potassium 3.6 3.5 - 5.1 mmol/L   Chloride 101 101 - 111 mmol/L   CO2 19 (L) 22 - 32 mmol/L   Glucose, Bld 80 65 - 99 mg/dL   BUN 10 6 - 20 mg/dL   Creatinine, Ser 9.56 0.44 - 1.00 mg/dL   Calcium 21.3 8.9 - 08.6 mg/dL   Total Protein 7.6 6.5 - 8.1 g/dL   Albumin 4.3 3.5 - 5.0 g/dL   AST 19 15 - 41 U/L   ALT 21 14 - 54 U/L   Alkaline Phosphatase 39 38 - 126 U/L   Total Bilirubin 1.4 (H) 0.3 - 1.2 mg/dL   GFR calc non Af Amer >60 >60 mL/min   GFR calc Af Amer >60 >60 mL/min   Anion gap 15 5 - 15   MAU Course  Procedures LR MTV Reglan Robinul  MDM Labs ordered and reviewed.  Transfer of care given to Daylene Posey, [place]  11/26/2017 7:51 PM   Patient reports complete resolution of pain. Able to tolerate sips of water and sprite while in MAU.  Assessment and Plan   1. Nausea and vomiting in pregnancy prior to [redacted] weeks gestation   2. [redacted] weeks gestation of pregnancy    -Discharge home in stable condition -Rx for robinol and reglan given to patient -Nausea and vomiting precautions discussed. Discussed with patient that marajuana use can exacerbate symptoms and  encouraged  patient to stop use. -Patient advised to follow-up with OB of choice to start prenatal care -Patient may return to MAU as needed or if her condition were to change or worsen  Rolm Bookbinder, [place] 11/26/17 8:53 PM

## 2017-11-26 NOTE — MAU Note (Signed)
Patient c/o  +nausea and vomiting Since Monday States unable to tolerate anything to eat or drink Reports Emesis x6 in the past 24 hours Has tried phenergan; states it helped but she ran out  Denies vaginal bleeding or discharge.

## 2017-12-07 LAB — OB RESULTS CONSOLE HIV ANTIBODY (ROUTINE TESTING): HIV: NONREACTIVE

## 2017-12-07 LAB — OB RESULTS CONSOLE GC/CHLAMYDIA
CHLAMYDIA, DNA PROBE: NEGATIVE
GC PROBE AMP, GENITAL: NEGATIVE

## 2017-12-07 LAB — OB RESULTS CONSOLE RUBELLA ANTIBODY, IGM: Rubella: IMMUNE

## 2017-12-07 LAB — OB RESULTS CONSOLE HEPATITIS B SURFACE ANTIGEN: Hepatitis B Surface Ag: NEGATIVE

## 2017-12-07 LAB — OB RESULTS CONSOLE RPR: RPR: NONREACTIVE

## 2018-03-11 ENCOUNTER — Encounter (HOSPITAL_COMMUNITY): Payer: Self-pay

## 2018-03-11 ENCOUNTER — Inpatient Hospital Stay (HOSPITAL_COMMUNITY)
Admission: AD | Admit: 2018-03-11 | Discharge: 2018-03-11 | Disposition: A | Discharge status: Home / Self Care | Payer: Medicaid Other | Source: Ambulatory Visit | Attending: Obstetrics and Gynecology | Admitting: Obstetrics and Gynecology

## 2018-03-11 DIAGNOSIS — Z79899 Other long term (current) drug therapy: Secondary | ICD-10-CM | POA: Insufficient documentation

## 2018-03-11 DIAGNOSIS — F209 Schizophrenia, unspecified: Secondary | ICD-10-CM | POA: Diagnosis not present

## 2018-03-11 DIAGNOSIS — F1290 Cannabis use, unspecified, uncomplicated: Secondary | ICD-10-CM | POA: Diagnosis not present

## 2018-03-11 DIAGNOSIS — O212 Late vomiting of pregnancy: Secondary | ICD-10-CM | POA: Insufficient documentation

## 2018-03-11 DIAGNOSIS — Z3A24 24 weeks gestation of pregnancy: Secondary | ICD-10-CM | POA: Diagnosis not present

## 2018-03-11 DIAGNOSIS — Z87891 Personal history of nicotine dependence: Secondary | ICD-10-CM | POA: Diagnosis not present

## 2018-03-11 DIAGNOSIS — O99322 Drug use complicating pregnancy, second trimester: Secondary | ICD-10-CM | POA: Insufficient documentation

## 2018-03-11 DIAGNOSIS — O99342 Other mental disorders complicating pregnancy, second trimester: Secondary | ICD-10-CM | POA: Diagnosis not present

## 2018-03-11 DIAGNOSIS — Z3A26 26 weeks gestation of pregnancy: Secondary | ICD-10-CM | POA: Insufficient documentation

## 2018-03-11 DIAGNOSIS — F419 Anxiety disorder, unspecified: Secondary | ICD-10-CM | POA: Diagnosis not present

## 2018-03-11 DIAGNOSIS — F319 Bipolar disorder, unspecified: Secondary | ICD-10-CM | POA: Insufficient documentation

## 2018-03-11 DIAGNOSIS — O219 Vomiting of pregnancy, unspecified: Secondary | ICD-10-CM

## 2018-03-11 HISTORY — DX: Bipolar disorder, current episode manic without psychotic features, unspecified: F31.10

## 2018-03-11 LAB — URINALYSIS, ROUTINE W REFLEX MICROSCOPIC
BILIRUBIN URINE: NEGATIVE
GLUCOSE, UA: NEGATIVE mg/dL
HGB URINE DIPSTICK: NEGATIVE
Ketones, ur: 5 mg/dL — AB
NITRITE: NEGATIVE
Protein, ur: 30 mg/dL — AB
SPECIFIC GRAVITY, URINE: 1.031 — AB (ref 1.005–1.030)
pH: 6 (ref 5.0–8.0)

## 2018-03-11 LAB — TSH: TSH: 0.607 u[IU]/mL (ref 0.350–4.500)

## 2018-03-11 LAB — RAPID URINE DRUG SCREEN, HOSP PERFORMED
Amphetamines: NOT DETECTED
Barbiturates: NOT DETECTED
Benzodiazepines: NOT DETECTED
Cocaine: NOT DETECTED
OPIATES: NOT DETECTED
TETRAHYDROCANNABINOL: POSITIVE — AB

## 2018-03-11 MED ORDER — ONDANSETRON 8 MG PO TBDP: 8.0000 mg | ORAL_TABLET | Freq: Three times a day (TID) | ORAL | 0 refills | Status: DC | PRN

## 2018-03-11 MED ORDER — PROMETHAZINE HCL 25 MG PO TABS
25.0000 mg | ORAL_TABLET | Freq: Four times a day (QID) | ORAL | Status: DC | PRN
Start: 2018-03-11 — End: 2018-03-11
  Administered 2018-03-11: 25 mg via ORAL
  Filled 2018-03-11: qty 1

## 2018-03-11 NOTE — Discharge Instructions (Signed)

## 2018-03-11 NOTE — MAU Note (Signed)
Breck CoonsKeira D N Colello is a 22 y.o. at 6863w1d here in MAU reporting:  +emesis Onset of complaint: started yesterday evening. 2 episodes vomiting since waking up this morning. Reports more frequent episodes through the night.  Unable to keep anything down. Pain score: denies Vitals:   03/11/18 1444  BP: 137/62  Pulse: (!) 104  Resp: 16  Temp: 98.3 F (36.8 C)  SpO2: 99%     FHT:155 Lab orders placed from triage: ua

## 2018-03-11 NOTE — MAU Provider Note (Signed)
Chief Complaint:  Emesis   None    HPI: Angela Christian is a 22 y.o. G1P0000 at 275w1d who presents to maternity admissions reporting nausea and vomiting.  States has not taken any nausea medication.  Does smoke a lot of marijuana.  States low weight gain with this pregnancy.  Not currently vomiting.   Quality:mild  Duration: throughout whole pregnancy   Denies contractions, leakage of fluid or vaginal bleeding. Good fetal movement.   Pregnancy Course:   Past Medical History:  Diagnosis Date  . Anxiety   . Bipolar disease, manic (HCC)   . Cyclical vomiting syndrome   . Depression   . Schizophrenia (HCC)    OB History  Gravida Para Term Preterm AB Living  1 0 0 0 0 0  SAB TAB Ectopic Multiple Live Births  0 0 0 0      # Outcome Date GA Lbr Len/2nd Weight Sex Delivery Anes PTL Lv  1 Current            Past Surgical History:  Procedure Laterality Date  . TONSILLECTOMY     Family History  Problem Relation Age of Onset  . Hypertension Mother    Social History   Tobacco Use  . Smoking status: Former Smoker    Packs/day: 0.00    Types: Cigarettes    Last attempt to quit: 10/08/2017    Years since quitting: 0.4  . Smokeless tobacco: Never Used  Substance Use Topics  . Alcohol use: Not Currently    Comment: varies from few shots of liquor to "whole bottle" daily  . Drug use: Yes    Types: Marijuana    Comment: last was 08/23   No Known Allergies Medications Prior to Admission  Medication Sig Dispense Refill Last Dose  . folic acid (FOLVITE) 800 MCG tablet Take 400 mcg by mouth daily.   10/10/2017 at Unknown time  . glycopyrrolate (ROBINUL) 1 MG tablet Take 1 tablet (1 mg total) by mouth 3 (three) times daily. 90 tablet 0   . metoCLOPramide (REGLAN) 10 MG tablet Take 1 tablet (10 mg total) by mouth every 6 (six) hours. 30 tablet 1   . ondansetron (ZOFRAN ODT) 8 MG disintegrating tablet Take 1 tablet (8 mg total) by mouth every 8 (eight) hours as needed for nausea  or vomiting. 10 tablet 0   . Prenatal Vit-Fe Fumarate-FA (PRENATAL MULTIVITAMIN) TABS tablet Take 1 tablet by mouth daily at 12 noon.   10/10/2017 at Unknown time  . promethazine (PHENERGAN) 25 MG tablet Take 0.5-1 tablets (12.5-25 mg total) by mouth every 6 (six) hours as needed. 30 tablet 0     I have reviewed patient's Past Medical Hx, Surgical Hx, Family Hx, Social Hx, medications and allergies.   ROS:  Review of Systems  Constitutional: Positive for appetite change.  HENT: Negative.   Eyes: Negative.   Respiratory: Negative.   Cardiovascular: Negative.   Gastrointestinal: Positive for nausea.  Endocrine: Negative.   Genitourinary: Negative.   Musculoskeletal: Negative.   Skin: Negative.   Allergic/Immunologic: Negative.   Neurological: Negative.   Hematological: Negative.   Psychiatric/Behavioral: Negative.     Physical Exam   Patient Vitals for the past 24 hrs:  BP Temp Temp src Pulse Resp SpO2 Weight  03/11/18 1444 137/62 98.3 F (36.8 C) Oral (!) 104 16 99 % 54.9 kg   Constitutional: Well-developed, well-nourished female in no acute distress.  Cardiovascular: normal rate Respiratory: normal effort GI: Abd soft, non-tender, gravid  appropriate for gestational age. Pos BS x 4 MS: Extremities nontender, no edema, normal ROM Neurologic: Alert and oriented x 4.  GU: Neg CVAT.  Pelvic: NEFG, physiologic discharge, no blood, cervix clean. No CMT     FHT:  Baseline 150 , moderate variability, accelerations present, no decelerations Contractions: none   Labs: Results for orders placed or performed during the hospital encounter of 03/11/18 (from the past 24 hour(s))  Urinalysis, Routine w reflex microscopic     Status: Abnormal   Collection Time: 03/11/18  2:46 PM  Result Value Ref Range   Color, Urine AMBER (A) YELLOW   APPearance HAZY (A) CLEAR   Specific Gravity, Urine 1.031 (H) 1.005 - 1.030   pH 6.0 5.0 - 8.0   Glucose, UA NEGATIVE NEGATIVE mg/dL   Hgb urine  dipstick NEGATIVE NEGATIVE   Bilirubin Urine NEGATIVE NEGATIVE   Ketones, ur 5 (A) NEGATIVE mg/dL   Protein, ur 30 (A) NEGATIVE mg/dL   Nitrite NEGATIVE NEGATIVE   Leukocytes, UA SMALL (A) NEGATIVE   RBC / HPF 11-20 0 - 5 RBC/hpf   WBC, UA 21-50 0 - 5 WBC/hpf   Bacteria, UA RARE (A) NONE SEEN   Squamous Epithelial / LPF 6-10 0 - 5   Mucus PRESENT    TSH pending Imaging:  No results found.  MAU Course: Orders Placed This Encounter  Procedures  . Urinalysis, Routine w reflex microscopic  . Urine rapid drug screen (hosp performed)   No orders of the defined types were placed in this encounter.   MDM: PE, UA and Drug screen Assessment: Nausea and vomiting in pregnancy Drug use in pregnancy Reassuring fetal status  Plan: TSH pending Discussed danger of drug use in pregnancy, and vomiting with marijuana use.  Eating small frequent meals.  Take phenergan as needed. Discharge home in stable condition.       [name], CNM 03/11/2018 4:00 PM

## 2018-04-02 ENCOUNTER — Other Ambulatory Visit: Payer: Self-pay

## 2018-04-02 ENCOUNTER — Encounter (HOSPITAL_COMMUNITY): Payer: Self-pay | Admitting: *Deleted

## 2018-04-02 ENCOUNTER — Observation Stay (HOSPITAL_COMMUNITY)
Admission: AD | Admit: 2018-04-02 | Discharge: 2018-04-03 | Disposition: A | Discharge status: Home / Self Care | Payer: Medicaid Other | Source: Ambulatory Visit | Attending: Obstetrics and Gynecology | Admitting: Obstetrics and Gynecology

## 2018-04-02 DIAGNOSIS — G43A1 Cyclical vomiting, intractable: Secondary | ICD-10-CM | POA: Diagnosis not present

## 2018-04-02 DIAGNOSIS — F1210 Cannabis abuse, uncomplicated: Secondary | ICD-10-CM | POA: Diagnosis not present

## 2018-04-02 DIAGNOSIS — K219 Gastro-esophageal reflux disease without esophagitis: Secondary | ICD-10-CM | POA: Diagnosis not present

## 2018-04-02 DIAGNOSIS — Z3A29 29 weeks gestation of pregnancy: Secondary | ICD-10-CM | POA: Diagnosis not present

## 2018-04-02 DIAGNOSIS — Z87891 Personal history of nicotine dependence: Secondary | ICD-10-CM | POA: Diagnosis not present

## 2018-04-02 DIAGNOSIS — O26893 Other specified pregnancy related conditions, third trimester: Secondary | ICD-10-CM | POA: Diagnosis not present

## 2018-04-02 DIAGNOSIS — O219 Vomiting of pregnancy, unspecified: Secondary | ICD-10-CM | POA: Diagnosis present

## 2018-04-02 DIAGNOSIS — O288 Other abnormal findings on antenatal screening of mother: Secondary | ICD-10-CM

## 2018-04-02 DIAGNOSIS — R1115 Cyclical vomiting syndrome unrelated to migraine: Secondary | ICD-10-CM

## 2018-04-02 LAB — CBC
HCT: 42.1 % (ref 36.0–46.0)
HEMOGLOBIN: 14.5 g/dL (ref 12.0–15.0)
MCH: 34.4 pg — ABNORMAL HIGH (ref 26.0–34.0)
MCHC: 34.4 g/dL (ref 30.0–36.0)
MCV: 100 fL (ref 78.0–100.0)
PLATELETS: 227 10*3/uL (ref 150–400)
RBC: 4.21 MIL/uL (ref 3.87–5.11)
RDW: 12.6 % (ref 11.5–15.5)
WBC: 12.1 10*3/uL — AB (ref 4.0–10.5)

## 2018-04-02 LAB — URINALYSIS, ROUTINE W REFLEX MICROSCOPIC
BILIRUBIN URINE: NEGATIVE
Glucose, UA: NEGATIVE mg/dL
Hgb urine dipstick: NEGATIVE
KETONES UR: 80 mg/dL — AB
NITRITE: NEGATIVE
PROTEIN: 100 mg/dL — AB
Specific Gravity, Urine: 1.031 — ABNORMAL HIGH (ref 1.005–1.030)
WBC, UA: >50 WBC/hpf — ABNORMAL HIGH (ref 0–5)
pH: 5 (ref 5.0–8.0)

## 2018-04-02 LAB — COMPREHENSIVE METABOLIC PANEL
ALBUMIN: 3.3 g/dL — AB (ref 3.5–5.0)
ALK PHOS: 67 U/L (ref 38–126)
ALT: 35 U/L (ref 0–44)
AST: 26 U/L (ref 15–41)
Anion gap: 12 (ref 5–15)
BUN: 12 mg/dL (ref 6–20)
CO2: 24 mmol/L (ref 22–32)
CREATININE: 0.72 mg/dL (ref 0.44–1.00)
Calcium: 9.2 mg/dL (ref 8.9–10.3)
Chloride: 102 mmol/L (ref 98–111)
GFR calc Af Amer: >60 mL/min (ref >60)
GLUCOSE: 75 mg/dL (ref 70–99)
Potassium: 3.9 mmol/L (ref 3.5–5.1)
SODIUM: 138 mmol/L (ref 135–145)
TOTAL PROTEIN: 6.4 g/dL — AB (ref 6.5–8.1)
Total Bilirubin: 0.8 mg/dL (ref 0.3–1.2)

## 2018-04-02 MED ORDER — INFLUENZA VAC SPLIT QUAD 0.5 ML IM SUSY: 0.5000 mL | PREFILLED_SYRINGE | INTRAMUSCULAR | Status: DC

## 2018-04-02 MED ORDER — LACTATED RINGERS IV BOLUS
1000.0000 mL | Freq: Once | INTRAVENOUS | Status: AC
  Administered 2018-04-02: 1000 mL via INTRAVENOUS

## 2018-04-02 MED ORDER — ACETAMINOPHEN 325 MG PO TABS: 650.0000 mg | ORAL_TABLET | ORAL | Status: DC | PRN

## 2018-04-02 MED ORDER — SODIUM CHLORIDE 0.9 % IV SOLN: 25.0000 mg | Freq: Four times a day (QID) | INTRAVENOUS | Status: DC

## 2018-04-02 MED ORDER — SCOPOLAMINE 1 MG/3DAYS TD PT72
1.0000 | MEDICATED_PATCH | TRANSDERMAL | Status: DC
  Administered 2018-04-02: 1.5 mg via TRANSDERMAL
  Filled 2018-04-02: qty 1

## 2018-04-02 MED ORDER — SODIUM CHLORIDE 0.9 % IV SOLN
INTRAVENOUS | Status: DC
  Administered 2018-04-02 – 2018-04-03 (×4): via INTRAVENOUS

## 2018-04-02 MED ORDER — ZOLPIDEM TARTRATE 5 MG PO TABS: 5.0000 mg | ORAL_TABLET | Freq: Every evening | ORAL | Status: DC | PRN

## 2018-04-02 MED ORDER — FAMOTIDINE IN NACL 20-0.9 MG/50ML-% IV SOLN: 20.0000 mg | Freq: Two times a day (BID) | INTRAVENOUS | Status: DC

## 2018-04-02 MED ORDER — PROMETHAZINE HCL 25 MG/ML IJ SOLN
25.0000 mg | Freq: Four times a day (QID) | INTRAMUSCULAR | Status: DC
  Administered 2018-04-02 – 2018-04-03 (×4): 25 mg via INTRAVENOUS
  Filled 2018-04-02 (×4): qty 1

## 2018-04-02 MED ORDER — DOCUSATE SODIUM 100 MG PO CAPS
100.0000 mg | ORAL_CAPSULE | Freq: Every day | ORAL | Status: DC
  Filled 2018-04-02: qty 1

## 2018-04-02 MED ORDER — PROMETHAZINE HCL 25 MG/ML IJ SOLN
25.0000 mg | Freq: Once | INTRAMUSCULAR | Status: DC
  Filled 2018-04-02: qty 1

## 2018-04-02 MED ORDER — PROMETHAZINE HCL 25 MG/ML IJ SOLN: 25.0000 mg | Freq: Four times a day (QID) | INTRAMUSCULAR | Status: DC

## 2018-04-02 MED ORDER — FAMOTIDINE IN NACL 20-0.9 MG/50ML-% IV SOLN
20.0000 mg | Freq: Two times a day (BID) | INTRAVENOUS | Status: DC
Start: 2018-04-02 — End: 2018-04-03
  Administered 2018-04-02 – 2018-04-03 (×2): 20 mg via INTRAVENOUS
  Filled 2018-04-02 (×2): qty 50

## 2018-04-02 MED ORDER — PRENATAL MULTIVITAMIN CH
1.0000 | ORAL_TABLET | Freq: Every day | ORAL | Status: DC
  Filled 2018-04-02: qty 1

## 2018-04-02 MED ORDER — ONDANSETRON HCL 4 MG/2ML IJ SOLN
4.0000 mg | Freq: Once | INTRAMUSCULAR | Status: AC
  Administered 2018-04-02: 4 mg via INTRAVENOUS
  Filled 2018-04-02: qty 2

## 2018-04-02 MED ORDER — CALCIUM CARBONATE ANTACID 500 MG PO CHEW: 2.0000 | CHEWABLE_TABLET | ORAL | Status: DC | PRN

## 2018-04-02 NOTE — Plan of Care (Signed)
  Problem: Nutrition: Goal: Adequate nutrition will be maintained Outcome: Progressing   Problem: Pain Managment: Goal: General experience of comfort will improve Outcome: Progressing   Problem: Safety: Goal: Ability to remain free from injury will improve Outcome: Progressing   Problem: Education: Goal: Knowledge of disease or condition will improve Outcome: Progressing   Problem: Pain Management: Goal: Relief or control of pain will improve Outcome: Progressing

## 2018-04-02 NOTE — MAU Note (Signed)
Pt stated she has been vomiting since Thursday. Can't keep anything down. Has some nausea medicine but it will not stay down either

## 2018-04-03 MED ORDER — ONDANSETRON 4 MG PO TBDP: 4.0000 mg | ORAL_TABLET | Freq: Three times a day (TID) | ORAL | 0 refills | Status: DC | PRN

## 2018-04-03 MED ORDER — DOXYLAMINE-PYRIDOXINE 10-10 MG PO TBEC: 2.0000 | DELAYED_RELEASE_TABLET | Freq: Every day | ORAL | 1 refills | Status: DC

## 2018-04-03 MED ORDER — RANITIDINE HCL 150 MG PO TABS: 150.0000 mg | ORAL_TABLET | Freq: Two times a day (BID) | ORAL | 1 refills | Status: DC

## 2018-04-03 NOTE — Progress Notes (Signed)
[facility]. Order placed by CNM for BPP.

## 2018-04-03 NOTE — Progress Notes (Signed)
Patient left AMA prior to BPP. I explained that test was for confirmation of fetal well-being and she stated she was "scheduled for an ultrasound in a few days, and she just wanted to leave."

## 2018-04-03 NOTE — Progress Notes (Signed)
RN called and stated the Charge nurse thought the babies NST was a little flat, I had the RN repeat the strip, I believe that the strip appeared stable phenergan given at 1130 today, 10x10 mild acels were noted, with moderate variabilities, x1 decel noted to nadir to 120s but resolved spontaneously on its own.   Dr Mora ApplPinn was notified and looked at the strip, was recommended to order BPP. BPP ordered.   12:46 PM RN just called to report pt wants to leave AM and signed AMA form. Discharge was already placed, then BPP ordered, BPP was not performed and left left.  BP 108/73 (BP Location: Left Arm)   Pulse 83   Temp 98.9 F (37.2 C) (Oral)   Resp 18   Ht 5\' 5"  (1.651 m)   Wt 54.4 kg   LMP 09/09/2017   SpO2 100%   BMI 19.97 kg/m

## 2018-04-03 NOTE — Progress Notes (Signed)
[facility] strip per request of charge nurse, Donzetta Sprungebbie Warren, RN. CNM asked me to place patient back of EFM for a 20 minute strip.

## 2018-04-03 NOTE — Discharge Instructions (Signed)
Cannabinoid Hyperemesis Syndrome °Cannabinoid hyperemesis syndrome (CHS) is a condition that causes repeated nausea, vomiting, and abdominal pain after long-term (chronic) use of marijuana (cannabis). People with CHS typically use marijuana 3-5 times a day for many years before they have symptoms, although it is possible to develop CHS with as little as 1 use per day. °Symptoms of CHS may be mild at first but can get worse and more frequent. In some cases, CHS may cause vomiting many times a day, which can lead to weight loss and dehydration. CHS may go away and come back many times (recur). People may not have symptoms or may otherwise be healthy in between CHS attacks. °What are the causes? °The exact cause of this condition is not known. Long-term use of marijuana may over-stimulate certain proteins in the brain that react with chemicals in marijuana (cannabinoid receptors). This over-stimulation may cause CHS. °What are the signs or symptoms? °Symptoms of this condition are often mild during the first few attacks, but they can get worse over time. Symptoms may include: °· Frequent nausea, especially early in the morning. °· Vomiting. °· Abdominal pain. ° °Taking several hot showers throughout the day can also be a sign of this condition. People with CHS may do this because it relieves symptoms. °How is this diagnosed? °This condition may be diagnosed based on: °· Your symptoms and medical history, including any drug use. °· A physical exam. ° °You may have tests done to rule out other problems. These tests may include: °· Blood tests. °· Urine tests. °· Imaging tests, such as an X-ray or CT scan. ° °How is this treated? °Treatment for this condition involves stopping marijuana use. Your health care provider may recommend: °· A drug rehabilitation program, if you have trouble stopping marijuana use. °· Medicines for nausea. °· Hot showers to help relieve symptoms. ° °Certain creams that contain a substance called  capsaicin may improve symptoms when applied to the abdomen. Ask your health care provider before starting any medicines or other treatments. °Severe nausea and vomiting may require you to stay at the hospital. You may need IV fluids to prevent or treat dehydration. You may also need certain medicines that must be given at the hospital. °Follow these instructions at home: °During an attack °· Stay in bed and rest in a dark, quiet room. °· Take anti-nausea medicine as told by your health care provider. °· Try taking hot showers to relieve your symptoms. °After an attack °· Drink small amounts of clear fluids slowly. Gradually add more. °· Once you are able to eat without vomiting, eat soft foods in small amounts every 3-4 hours. °General instructions °· Do not use any products that contain marijuana.If you need help quitting, ask your health care provider for resources and treatment options. °· Drink enough fluid to keep your urine pale yellow. Avoid drinking fluids that have a lot of sugar or caffeine, such as coffee and soda. °· Take and apply over-the-counter and prescription medicines only as told by your health care provider. Ask your health care provider before starting any new medicines or treatments. °· Keep all follow-up visits as told by your health care provider. This is important. °Contact a health care provider if: °· Your symptoms get worse. °· You cannot drink fluids without vomiting. °· You have pain and trouble swallowing after an attack. °Get help right away if: °· You cannot stop vomiting. °· You have blood in your vomit or your vomit looks like coffee grounds. °·   You have severe abdominal pain. °· You have stools that are bloody or black, or stools that look like tar. °· You have symptoms of dehydration, such as: °? Sunken eyes. °? Inability to make tears. °? Cracked lips. °? Dry mouth. °? Decreased urine production. °? Weakness. °? Sleepiness. °? Fainting. °Summary °· Cannabinoid hyperemesis  syndrome (CHS) is a condition that causes repeated nausea, vomiting, and abdominal pain after long-term use of marijuana. °· People with CHS typically use marijuana 3-5 times a day for many years before they have symptoms, although it is possible to develop CHS with as little as 1 use per day. °· Treatment for this condition involves stopping marijuana use. Hot showers and capsaicin creams may also help relieve symptoms. Ask your health care provider before starting any medicines or other treatments. °· Your health care provider may prescribe medicines to help with nausea. °· Get help right away if you have signs of dehydration, such as dry mouth, decreased urine production, or weakness. °This information is not intended to replace advice given to you by your health care provider. Make sure you discuss any questions you have with your health care provider. °Document Released: 10/13/2016 Document Revised: 10/13/2016 Document Reviewed: 10/13/2016 °Elsevier Interactive Patient Education © 2018 [employer] ° °

## 2018-04-03 NOTE — Discharge Summary (Signed)
Chief Complaint:  Emesis  Patient Active Problem List   Diagnosis Date Noted  . Cannabis abuse, continuous 04/02/2018  . Gastroesophageal reflux disease 04/02/2018  . [redacted] weeks gestation of pregnancy 04/02/2018  . Vomiting pregnancy 04/02/2018  . MDD (major depressive disorder) 09/10/2015  . Major depressive disorder, recurrent episode (HCC) 09/10/2015  . Polysubstance abuse (HCC) 09/10/2015  . Unspecified mood (affective) disorder (HCC) 09/10/2015  . Secondary amenorrhea 02/27/2015  . Cyclical vomiting syndrome 02/10/2015     HPI: Angela Christian is a 22 y.o. G1P0000 at [redacted]w[redacted]d who presents to maternity admissions reporting here with N/V. Sx started 4 days ago. She cannot tolerate anything po. Denies fever, diarrhea, and sick contacts. She was using Phenergan po but unable to keep it down. Also reports excessive salivation and spitting. Denies VB or cramping. Having some "tightening" pain when she vomits. She admits to using Marijuana to improve her appetite and nausea, last use has been awhile. Denies contractions, leakage of fluid or vaginal bleeding. Good fetal movement.   Pregnancy Course:   Past Medical History:  Diagnosis Date  . Anxiety   . Bipolar disease, manic (HCC)   . Cyclical vomiting syndrome   . Depression   . Schizophrenia (HCC)    OB History  Gravida Para Term Preterm AB Living  1 0 0 0 0 0  SAB TAB Ectopic Multiple Live Births  0 0 0 0      # Outcome Date GA Lbr Len/2nd Weight Sex Delivery Anes PTL Lv  1 Current            Past Surgical History:  Procedure Laterality Date  . TONSILLECTOMY     Family History  Problem Relation Age of Onset  . Hypertension Mother    Social History   Tobacco Use  . Smoking status: Former Smoker    Packs/day: 0.00    Types: Cigarettes    Last attempt to quit: 10/08/2017    Years since quitting: 0.4  . Smokeless tobacco: Never Used  Substance Use Topics  . Alcohol use: Not Currently    Comment: varies from few  shots of liquor to "whole bottle" daily  . Drug use: Yes    Types: Marijuana    Comment: last was 08/23   No Known Allergies Medications Prior to Admission  Medication Sig Dispense Refill Last Dose  . calcium carbonate (TUMS - DOSED IN MG ELEMENTAL CALCIUM) 500 MG chewable tablet Chew 2 tablets by mouth 2 (two) times daily as needed for indigestion or heartburn.   Past Month at Unknown time  . doxylamine, Sleep, (UNISOM) 25 MG tablet Take 25 mg by mouth at bedtime as needed for sleep.   Past Month at Unknown time  . Multiple Vitamins-Minerals (MULTIVITAMIN WITH MINERALS) tablet Take 1 tablet by mouth daily.   Past Week at Unknown time  . promethazine (PHENERGAN) 25 MG tablet Take 0.5-1 tablets (12.5-25 mg total) by mouth every 6 (six) hours as needed. 30 tablet 0 04/02/2018 at Unknown time  . metoCLOPramide (REGLAN) 10 MG tablet Take 1 tablet (10 mg total) by mouth every 6 (six) hours. 30 tablet 1   . ondansetron (ZOFRAN ODT) 8 MG disintegrating tablet Take 1 tablet (8 mg total) by mouth every 8 (eight) hours as needed for nausea or vomiting. 10 tablet 0     I have reviewed patient's Past Medical Hx, Surgical Hx, Family Hx, Social Hx, medications and allergies.   ROS:  Review of Systems  Physical Exam  Patient Vitals for the past 24 hrs:  BP Temp Temp src Pulse Resp SpO2 Height Weight  04/03/18 0823 109/68 - - - - - - -  04/03/18 0808 - 98.4 F (36.9 C) Oral 76 - 100 % - -  04/02/18 2332 (!) 97/51 98.7 F (37.1 C) Oral 77 16 100 % - -  04/02/18 1957 (!) 100/47 98.8 F (37.1 C) Oral 86 16 100 % - -  04/02/18 1803 120/61 98.8 F (37.1 C) Oral 82 18 100 % - -  04/02/18 1625 116/64 - - 77 - - - -  04/02/18 1500 (!) 154/87 98 F (36.7 C) - (!) 104 18 - 5\' 5"  (1.651 m) 54.4 kg   Constitutional: Well-developed, well-nourished female in no acute distress.  Cardiovascular: normal rate Respiratory: normal effort GI: Abd soft, non-tender, gravid appropriate for gestational age. Pos BS x  4 MS: Extremities nontender, no edema, normal ROM Neurologic: Alert and oriented x 4.  GU: Neg CVAT.   NST: FHR baseline 140 bpm, Variability: moderate, Accelerations:present, Decelerations:  Absent= Cat 1/Reactive 10x10acels for gestational weeks UC:   none  Labs: Results for orders placed or performed during the hospital encounter of 04/02/18 (from the past 24 hour(s))  Urinalysis, Routine w reflex microscopic     Status: Abnormal   Collection Time: 04/02/18  3:22 PM  Result Value Ref Range   Color, Urine AMBER (A) YELLOW   APPearance CLOUDY (A) CLEAR   Specific Gravity, Urine 1.031 (H) 1.005 - 1.030   pH 5.0 5.0 - 8.0   Glucose, UA NEGATIVE NEGATIVE mg/dL   Hgb urine dipstick NEGATIVE NEGATIVE   Bilirubin Urine NEGATIVE NEGATIVE   Ketones, ur 80 (A) NEGATIVE mg/dL   Protein, ur 161 (A) NEGATIVE mg/dL   Nitrite NEGATIVE NEGATIVE   Leukocytes, UA MODERATE (A) NEGATIVE   RBC / HPF 6-10 0 - 5 RBC/hpf   WBC, UA >50 (H) 0 - 5 WBC/hpf   Bacteria, UA RARE (A) NONE SEEN   Squamous Epithelial / LPF 21-50 0 - 5   Mucus PRESENT   CBC     Status: Abnormal   Collection Time: 04/02/18  3:47 PM  Result Value Ref Range   WBC 12.1 (H) 4.0 - 10.5 K/uL   RBC 4.21 3.87 - 5.11 MIL/uL   Hemoglobin 14.5 12.0 - 15.0 g/dL   HCT 09.6 04.5 - 40.9 %   MCV 100.0 78.0 - 100.0 fL   MCH 34.4 (H) 26.0 - 34.0 pg   MCHC 34.4 30.0 - 36.0 g/dL   RDW 81.1 91.4 - 78.2 %   Platelets 227 150 - 400 K/uL  Comprehensive metabolic panel     Status: Abnormal   Collection Time: 04/02/18  4:44 PM  Result Value Ref Range   Sodium 138 135 - 145 mmol/L   Potassium 3.9 3.5 - 5.1 mmol/L   Chloride 102 98 - 111 mmol/L   CO2 24 22 - 32 mmol/L   Glucose, Bld 75 70 - 99 mg/dL   BUN 12 6 - 20 mg/dL   Creatinine, Ser 9.56 0.44 - 1.00 mg/dL   Calcium 9.2 8.9 - 21.3 mg/dL   Total Protein 6.4 (L) 6.5 - 8.1 g/dL   Albumin 3.3 (L) 3.5 - 5.0 g/dL   AST 26 15 - 41 U/L   ALT 35 0 - 44 U/L   Alkaline Phosphatase 67 38 - 126  U/L   Total Bilirubin 0.8 0.3 - 1.2 mg/dL   GFR calc non  Af Amer >60 >60 mL/min   GFR calc Af Amer >60 >60 mL/min   Anion gap 12 5 - 15    Imaging:  No results found.  MAU Course: Orders Placed This Encounter  Procedures  . Urinalysis, Routine w reflex microscopic  . CBC  . Comprehensive metabolic panel  . Diet regular Room service appropriate? Yes; Fluid consistency: Thin  . Diet - low sodium heart healthy  . Notify physician (specify)  . Vital signs  . Defer vaginal exam for vaginal bleeding or PROM <37 weeks  . Initiate Oral Care Protocol  . Initiate Carrier Fluid Protocol  . Activity as tolerated  . Advance diet as tolerated  . Increase activity slowly  . Call MD for:  . Call MD for:  temperature >100.4  . Call MD for:  persistant nausea and vomiting  . Call MD for:  severe uncontrolled pain  . Call MD for:  redness, tenderness, or signs of infection (pain, swelling, redness, odor or green/yellow discharge around incision site)  . Call MD for:  difficulty breathing, headache or visual disturbances  . Call MD for:  hives  . Call MD for:  persistant dizziness or light-headedness  . Call MD for:  extreme fatigue  . (HEART FAILURE PATIENTS) Call MD:  Anytime you have any of the following symptoms: 1) 3 pound weight gain in 24 hours or 5 pounds in 1 week 2) shortness of breath, with or without a dry hacking cough 3) swelling in the hands, feet or stomach 4) if you have to sleep on extra pillows at night in order to breathe.  . Full code  . Fetal nonstress test  . Place in observation (patient's expected length of stay will be less than 2 midnights)  . Discharge patient Discharge disposition: 01-Home or Self Care; Discharge patient date: 04/03/2018   Meds ordered this encounter  Medications  . lactated ringers bolus 1,000 mL  . ondansetron (ZOFRAN) injection 4 mg  . DISCONTD: promethazine (PHENERGAN) 25 mg in sodium chloride 0.9 % 1,000 mL infusion  . DISCONTD: famotidine  (PEPCID) IVPB 20 mg premix  . famotidine (PEPCID) IVPB 20 mg premix  . acetaminophen (TYLENOL) tablet 650 mg  . zolpidem (AMBIEN) tablet 5 mg  . docusate sodium (COLACE) capsule 100 mg  . calcium carbonate (TUMS - dosed in mg elemental calcium) chewable tablet 400 mg of elemental calcium  . prenatal multivitamin tablet 1 tablet  . DISCONTD: promethazine (PHENERGAN) 25 mg in sodium chloride 0.9 % 1,000 mL infusion  . scopolamine (TRANSDERM-SCOP) 1 MG/3DAYS 1.5 mg  . 0.9 %  sodium chloride infusion  . DISCONTD: promethazine (PHENERGAN) injection 25 mg  . promethazine (PHENERGAN) injection 25 mg  . Influenza vac split quadrivalent PF (FLUARIX) injection 0.5 mL  . ranitidine (ZANTAC) 150 MG tablet    Sig: Take 1 tablet (150 mg total) by mouth 2 (two) times daily.    Dispense:  60 tablet    Refill:  1    Order Specific Question:   Supervising Provider    Answer:   ROBERTS, ANGELA [[id number]]  . ondansetron (ZOFRAN ODT) 4 MG disintegrating tablet    Sig: Take 1 tablet (4 mg total) by mouth every 8 (eight) hours as needed for nausea or vomiting.    Dispense:  30 tablet    Refill:  0    Order Specific Question:   Supervising Provider    Answer:   ROBERTS, ANGELA [[id number]]  . Doxylamine-Pyridoxine 10-10 MG TBEC  Sig: Take 2 tablets by mouth at bedtime.    Dispense:  60 tablet    Refill:  1    Order Specific Question:   Supervising Provider    Answer:   Osborn CohoOBERTS, ANGELA [[id number]]    MDM: Labs, charte, VS. Obs. NST   Assessment:  Angela Christian is a 22 y.o. G1P0000 at 5065w3d with below dx/  1. Intractable cyclical vomiting with nausea   2. Cannabis abuse, continuous   3. [redacted] weeks gestation of pregnancy   4. Gastroesophageal reflux disease, esophagitis presence not specified   Pt stable, PO challenged and tolerated well. Cat 1 strip while in observations. No N/V now. Pt endorses wanting to go home.   Plan: Discharge home in stable condition.  Labor precautions and fetal kick  counts Nausea/Vomitting: Take zofran, diclegis and zantac as prescribed, eat protein every 2 hours, stay hydrated, do not smoke maijauna.  Follow-up Information    [facility].   Specialty:  Obstetrics and Gynecology Why:  Next ROB visit, with growth scan.  Contact information: [address] [place] [id number] [phone]          Allergies as of 04/03/2018   No Known Allergies     Medication List    STOP taking these medications   doxylamine (Sleep) 25 MG tablet Commonly known as:  UNISOM   metoCLOPramide 10 MG tablet Commonly known as:  REGLAN     TAKE these medications   calcium carbonate 500 MG chewable tablet Commonly known as:  TUMS - dosed in mg elemental calcium Chew 2 tablets by mouth 2 (two) times daily as needed for indigestion or heartburn.   Doxylamine-Pyridoxine 10-10 MG Tbec Take 2 tablets by mouth at bedtime.   multivitamin with minerals tablet Take 1 tablet by mouth daily.   ondansetron 8 MG disintegrating tablet Commonly known as:  ZOFRAN-ODT Take 1 tablet (8 mg total) by mouth every 8 (eight) hours as needed for nausea or vomiting. What changed:  Another medication with the same name was added. Make sure you understand how and when to take each.   ondansetron 4 MG disintegrating tablet Commonly known as:  ZOFRAN-ODT Take 1 tablet (4 mg total) by mouth every 8 (eight) hours as needed for nausea or vomiting. What changed:  You were already taking a medication with the same name, and this prescription was added. Make sure you understand how and when to take each.   promethazine 25 MG tablet Commonly known as:  PHENERGAN Take 0.5-1 tablets (12.5-25 mg total) by mouth every 6 (six) hours as needed.   ranitidine 150 MG tablet Commonly known as:  ZANTAC Take 1 tablet (150 mg total) by mouth 2 (two) times daily.       [facility] Quince Santana NP-C, CNM [place], Tangelia Sanson,  [place] 04/03/2018 11:52 AM

## 2018-04-14 ENCOUNTER — Other Ambulatory Visit (HOSPITAL_COMMUNITY): Payer: Self-pay | Admitting: Obstetrics and Gynecology

## 2018-04-14 DIAGNOSIS — O358XX9 Maternal care for other (suspected) fetal abnormality and damage, other fetus: Secondary | ICD-10-CM

## 2018-04-19 ENCOUNTER — Encounter (HOSPITAL_COMMUNITY): Payer: Self-pay

## 2018-04-19 ENCOUNTER — Other Ambulatory Visit (HOSPITAL_COMMUNITY): Payer: Self-pay | Admitting: Obstetrics and Gynecology

## 2018-04-19 ENCOUNTER — Ambulatory Visit (HOSPITAL_COMMUNITY)
Admission: RE | Admit: 2018-04-19 | Discharge: 2018-04-19 | Disposition: A | Discharge status: Home / Self Care | Payer: Medicaid Other | Source: Ambulatory Visit | Attending: Obstetrics and Gynecology | Admitting: Obstetrics and Gynecology

## 2018-04-19 ENCOUNTER — Other Ambulatory Visit: Payer: Self-pay

## 2018-04-19 ENCOUNTER — Other Ambulatory Visit (HOSPITAL_COMMUNITY): Payer: Self-pay | Admitting: *Deleted

## 2018-04-19 DIAGNOSIS — Z3A31 31 weeks gestation of pregnancy: Secondary | ICD-10-CM | POA: Diagnosis not present

## 2018-04-19 DIAGNOSIS — O358XX9 Maternal care for other (suspected) fetal abnormality and damage, other fetus: Secondary | ICD-10-CM

## 2018-04-19 DIAGNOSIS — O288 Other abnormal findings on antenatal screening of mother: Secondary | ICD-10-CM | POA: Insufficient documentation

## 2018-04-19 DIAGNOSIS — O365920 Maternal care for other known or suspected poor fetal growth, second trimester, not applicable or unspecified: Secondary | ICD-10-CM

## 2018-04-19 DIAGNOSIS — Z363 Encounter for antenatal screening for malformations: Secondary | ICD-10-CM | POA: Diagnosis not present

## 2018-04-19 DIAGNOSIS — O365990 Maternal care for other known or suspected poor fetal growth, unspecified trimester, not applicable or unspecified: Secondary | ICD-10-CM

## 2018-04-19 DIAGNOSIS — K8020 Calculus of gallbladder without cholecystitis without obstruction: Secondary | ICD-10-CM | POA: Diagnosis not present

## 2018-04-19 DIAGNOSIS — O99342 Other mental disorders complicating pregnancy, second trimester: Secondary | ICD-10-CM | POA: Diagnosis not present

## 2018-04-21 ENCOUNTER — Other Ambulatory Visit: Payer: Self-pay

## 2018-04-26 ENCOUNTER — Encounter (HOSPITAL_COMMUNITY): Payer: Self-pay

## 2018-04-26 ENCOUNTER — Telehealth (HOSPITAL_COMMUNITY): Payer: Self-pay | Admitting: Obstetrics and Gynecology

## 2018-04-26 ENCOUNTER — Other Ambulatory Visit (HOSPITAL_COMMUNITY): Payer: Self-pay | Admitting: Maternal & Fetal Medicine

## 2018-04-26 ENCOUNTER — Ambulatory Visit (HOSPITAL_COMMUNITY)
Admission: RE | Admit: 2018-04-26 | Discharge: 2018-04-26 | Disposition: A | Discharge status: Home / Self Care | Payer: Medicaid Other | Source: Ambulatory Visit | Attending: Obstetrics and Gynecology | Admitting: Obstetrics and Gynecology

## 2018-04-26 DIAGNOSIS — O365930 Maternal care for other known or suspected poor fetal growth, third trimester, not applicable or unspecified: Secondary | ICD-10-CM | POA: Diagnosis present

## 2018-04-26 DIAGNOSIS — Z3A32 32 weeks gestation of pregnancy: Secondary | ICD-10-CM | POA: Insufficient documentation

## 2018-04-26 DIAGNOSIS — O365990 Maternal care for other known or suspected poor fetal growth, unspecified trimester, not applicable or unspecified: Secondary | ICD-10-CM

## 2018-04-27 ENCOUNTER — Encounter (HOSPITAL_COMMUNITY): Payer: Self-pay

## 2018-05-10 ENCOUNTER — Other Ambulatory Visit (HOSPITAL_COMMUNITY): Payer: Self-pay | Admitting: Maternal & Fetal Medicine

## 2018-05-10 ENCOUNTER — Encounter (HOSPITAL_COMMUNITY): Payer: Self-pay

## 2018-05-10 ENCOUNTER — Ambulatory Visit (HOSPITAL_COMMUNITY)
Admission: RE | Admit: 2018-05-10 | Discharge: 2018-05-10 | Disposition: A | Discharge status: Home / Self Care | Payer: Medicaid Other | Source: Ambulatory Visit | Attending: Obstetrics and Gynecology | Admitting: Obstetrics and Gynecology

## 2018-05-10 DIAGNOSIS — O365930 Maternal care for other known or suspected poor fetal growth, third trimester, not applicable or unspecified: Secondary | ICD-10-CM | POA: Diagnosis not present

## 2018-05-10 DIAGNOSIS — Z3A34 34 weeks gestation of pregnancy: Secondary | ICD-10-CM | POA: Insufficient documentation

## 2018-05-10 DIAGNOSIS — O365990 Maternal care for other known or suspected poor fetal growth, unspecified trimester, not applicable or unspecified: Secondary | ICD-10-CM

## 2018-05-10 DIAGNOSIS — O99343 Other mental disorders complicating pregnancy, third trimester: Secondary | ICD-10-CM | POA: Diagnosis not present

## 2018-05-11 ENCOUNTER — Other Ambulatory Visit (HOSPITAL_COMMUNITY): Payer: Self-pay | Admitting: *Deleted

## 2018-05-11 DIAGNOSIS — O365930 Maternal care for other known or suspected poor fetal growth, third trimester, not applicable or unspecified: Secondary | ICD-10-CM

## 2018-05-15 LAB — OB RESULTS CONSOLE GBS: STREP GROUP B AG: NEGATIVE

## 2018-05-25 ENCOUNTER — Inpatient Hospital Stay (HOSPITAL_COMMUNITY)
Admission: AD | Admit: 2018-05-25 | Discharge: 2018-05-25 | Disposition: A | Discharge status: Home / Self Care | Payer: Medicaid Other | Source: Ambulatory Visit | Attending: Obstetrics and Gynecology | Admitting: Obstetrics and Gynecology

## 2018-05-25 ENCOUNTER — Encounter (HOSPITAL_COMMUNITY): Payer: Self-pay | Admitting: *Deleted

## 2018-05-25 DIAGNOSIS — Y92414 Local residential or business street as the place of occurrence of the external cause: Secondary | ICD-10-CM | POA: Insufficient documentation

## 2018-05-25 DIAGNOSIS — O9A213 Injury, poisoning and certain other consequences of external causes complicating pregnancy, third trimester: Secondary | ICD-10-CM | POA: Diagnosis not present

## 2018-05-25 DIAGNOSIS — R1030 Lower abdominal pain, unspecified: Secondary | ICD-10-CM | POA: Diagnosis not present

## 2018-05-25 DIAGNOSIS — O26893 Other specified pregnancy related conditions, third trimester: Secondary | ICD-10-CM | POA: Insufficient documentation

## 2018-05-25 DIAGNOSIS — Z3A36 36 weeks gestation of pregnancy: Secondary | ICD-10-CM

## 2018-05-25 DIAGNOSIS — Z87891 Personal history of nicotine dependence: Secondary | ICD-10-CM | POA: Insufficient documentation

## 2018-05-25 LAB — URINALYSIS, ROUTINE W REFLEX MICROSCOPIC
Bilirubin Urine: NEGATIVE
GLUCOSE, UA: NEGATIVE mg/dL
HGB URINE DIPSTICK: NEGATIVE
Ketones, ur: NEGATIVE mg/dL
NITRITE: NEGATIVE
PROTEIN: NEGATIVE mg/dL
Specific Gravity, Urine: 1.021 (ref 1.005–1.030)
pH: 6 (ref 5.0–8.0)
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
## 2018-05-28 ENCOUNTER — Encounter (HOSPITAL_COMMUNITY): Payer: Self-pay | Admitting: *Deleted

## 2018-05-28 ENCOUNTER — Inpatient Hospital Stay (HOSPITAL_COMMUNITY)
Admission: AD | Admit: 2018-05-28 | Discharge: 2018-05-28 | Disposition: A | Discharge status: Home / Self Care | Payer: Medicaid Other | Attending: Obstetrics & Gynecology | Admitting: Obstetrics & Gynecology

## 2018-05-28 DIAGNOSIS — O479 False labor, unspecified: Secondary | ICD-10-CM

## 2018-05-28 DIAGNOSIS — O471 False labor at or after 37 completed weeks of gestation: Secondary | ICD-10-CM | POA: Insufficient documentation

## 2018-05-28 DIAGNOSIS — Z3A37 37 weeks gestation of pregnancy: Secondary | ICD-10-CM | POA: Insufficient documentation

## 2018-05-28 NOTE — OB Triage Note (Signed)
I have communicated with Erin lawrence, npand reviewed vital signs:  Vitals:   05/28/18 0247 05/28/18 0332  BP: 117/62 121/74  Pulse: 92 93  Resp:  17  Temp:  98.3 F (36.8 C)    Vaginal exam:  Dilation: 1 Effacement (%): 60 Station: -2 Presentation: Vertex Exam by:: Karl Ito, rnc ,   Also reviewed contraction pattern and that non-stress test is reactive.  It has been documented that patient is contracting every 5-8 minutes not indicating active labor.  Patient denies any other complaints.  Based on this report provider has given order for discharge.  A discharge order and diagnosis entered by a provider.   Labor discharge instructions reviewed with patient.

## 2018-05-28 NOTE — Progress Notes (Signed)
RN labor evaluation.  VS & FHT reviewed.  Reactive NST.   NST:  Baseline: 140 bpm, Variability: Good {> 6 bpm), Accelerations: Reactive and Decelerations: Absent   Judeth Horn, NP

## 2018-05-28 NOTE — Discharge Instructions (Signed)
Braxton Hicks Contractions °Contractions of the uterus can occur throughout pregnancy, but they are not always a sign that you are in labor. You may have practice contractions called Braxton Hicks contractions. These false labor contractions are sometimes confused with true labor. °What are Braxton Hicks contractions? °Braxton Hicks contractions are tightening movements that occur in the muscles of the uterus before labor. Unlike true labor contractions, these contractions do not result in opening (dilation) and thinning of the cervix. Toward the end of pregnancy (32-34 weeks), Braxton Hicks contractions can happen more often and may become stronger. These contractions are sometimes difficult to tell apart from true labor because they can be very uncomfortable. You should not feel embarrassed if you go to the hospital with false labor. °Sometimes, the only way to tell if you are in true labor is for your health care provider to look for changes in the cervix. The health care provider will do a physical exam and may monitor your contractions. If you are not in true labor, the exam should show that your cervix is not dilating and your water has not broken. °If there are other health problems associated with your pregnancy, it is completely safe for you to be sent home with false labor. You may continue to have Braxton Hicks contractions until you go into true labor. °How to tell the difference between true labor and false labor °True labor °· Contractions last 30-70 seconds. °· Contractions become very regular. °· Discomfort is usually felt in the top of the uterus, and it spreads to the lower abdomen and low back. °· Contractions do not go away with walking. °· Contractions usually become more intense and increase in frequency. °· The cervix dilates and gets thinner. °False labor °· Contractions are usually shorter and not as strong as true labor contractions. °· Contractions are usually irregular. °· Contractions  are often felt in the front of the lower abdomen and in the groin. °· Contractions may go away when you walk around or change positions while lying down. °· Contractions get weaker and are shorter-lasting as time goes on. °· The cervix usually does not dilate or become thin. °Follow these instructions at home: °· Take over-the-counter and prescription medicines only as told by your health care provider. °· Keep up with your usual exercises and follow other instructions from your health care provider. °· Eat and drink lightly if you think you are going into labor. °· If Braxton Hicks contractions are making you uncomfortable: °? Change your position from lying down or resting to walking, or change from walking to resting. °? Sit and rest in a tub of warm water. °? Drink enough fluid to keep your urine pale yellow. Dehydration may cause these contractions. °? Do slow and deep breathing several times an hour. °· Keep all follow-up prenatal visits as told by your health care provider. This is important. °Contact a health care provider if: °· You have a fever. °· You have continuous pain in your abdomen. °Get help right away if: °· Your contractions become stronger, more regular, and closer together. °· You have fluid leaking or gushing from your vagina. °· You pass blood-tinged mucus (bloody show). °· You have bleeding from your vagina. °· You have low back pain that you never had before. °· You feel your baby’s head pushing down and causing pelvic pressure. °· Your baby is not moving inside you as much as it used to. °Summary °· Contractions that occur before labor are called Braxton   Hicks contractions, false labor, or practice contractions. °· Braxton Hicks contractions are usually shorter, weaker, farther apart, and less regular than true labor contractions. True labor contractions usually become progressively stronger and regular and they become more frequent. °· Manage discomfort from Braxton Hicks contractions by  changing position, resting in a warm bath, drinking plenty of water, or practicing deep breathing. °This information is not intended to replace advice given to you by your health care provider. Make sure you discuss any questions you have with your health care provider. °Document Released: 11/18/2016 Document Revised: 11/18/2016 Document Reviewed: 11/18/2016 °Elsevier Interactive Patient Education © 2018 [employer] ° °Fetal Movement Counts °Patient Name: ________________________________________________ Patient Due Date: ____________________ °What is a fetal movement count? °A fetal movement count is the number of times that you feel your baby move during a certain amount of time. This may also be called a fetal kick count. A fetal movement count is recommended for every pregnant woman. You may be asked to start counting fetal movements as early as week 28 of your pregnancy. °Pay attention to when your baby is most active. You may notice your baby's sleep and wake cycles. You may also notice things that make your baby move more. You should do a fetal movement count: °· When your baby is normally most active. °· At the same time each day. ° °A good time to count movements is while you are resting, after having something to eat and drink. °How do I count fetal movements? °1. Find a quiet, comfortable area. Sit, or lie down on your side. °2. Write down the date, the start time and stop time, and the number of movements that you felt between those two times. Take this information with you to your health care visits. °3. For 2 hours, count kicks, flutters, swishes, rolls, and jabs. You should feel at least 10 movements during 2 hours. °4. You may stop counting after you have felt 10 movements. °5. If you do not feel 10 movements in 2 hours, have something to eat and drink. Then, keep resting and counting for 1 hour. If you feel at least 4 movements during that hour, you may stop counting. °Contact a health care  provider if: °· You feel fewer than 4 movements in 2 hours. °· Your baby is not moving like he or she usually does. °Date: ____________ Start time: ____________ Stop time: ____________ Movements: ____________ °Date: ____________ Start time: ____________ Stop time: ____________ Movements: ____________ °Date: ____________ Start time: ____________ Stop time: ____________ Movements: ____________ °Date: ____________ Start time: ____________ Stop time: ____________ Movements: ____________ °Date: ____________ Start time: ____________ Stop time: ____________ Movements: ____________ °Date: ____________ Start time: ____________ Stop time: ____________ Movements: ____________ °Date: ____________ Start time: ____________ Stop time: ____________ Movements: ____________ °Date: ____________ Start time: ____________ Stop time: ____________ Movements: ____________ °Date: ____________ Start time: ____________ Stop time: ____________ Movements: ____________ °This information is not intended to replace advice given to you by your health care provider. Make sure you discuss any questions you have with your health care provider. °Document Released: 08/04/2006 Document Revised: 03/03/2016 Document Reviewed: 08/14/2015 °Elsevier Interactive Patient Education © 2018 Elsevier Inc. ° °

## 2018-05-28 NOTE — MAU Note (Signed)
Ctxs on and off all day. Stronger for last hour and half. Denies vag bleeding or LOF. Ctxs not as strong now as earlier

## 2018-05-31 ENCOUNTER — Ambulatory Visit (HOSPITAL_COMMUNITY)
Admission: RE | Admit: 2018-05-31 | Discharge: 2018-05-31 | Disposition: A | Discharge status: Home / Self Care | Payer: Medicaid Other | Source: Ambulatory Visit | Attending: Obstetrics and Gynecology | Admitting: Obstetrics and Gynecology

## 2018-05-31 ENCOUNTER — Ambulatory Visit (HOSPITAL_COMMUNITY): Payer: Medicaid Other

## 2018-05-31 ENCOUNTER — Encounter (HOSPITAL_COMMUNITY): Payer: Self-pay

## 2018-05-31 DIAGNOSIS — Z3A37 37 weeks gestation of pregnancy: Secondary | ICD-10-CM | POA: Diagnosis not present

## 2018-05-31 DIAGNOSIS — Z362 Encounter for other antenatal screening follow-up: Secondary | ICD-10-CM | POA: Diagnosis not present

## 2018-05-31 DIAGNOSIS — F1210 Cannabis abuse, uncomplicated: Secondary | ICD-10-CM | POA: Diagnosis not present

## 2018-05-31 DIAGNOSIS — O365930 Maternal care for other known or suspected poor fetal growth, third trimester, not applicable or unspecified: Secondary | ICD-10-CM

## 2018-05-31 DIAGNOSIS — Z3A35 35 weeks gestation of pregnancy: Secondary | ICD-10-CM | POA: Diagnosis present

## 2018-05-31 DIAGNOSIS — Z3689 Encounter for other specified antenatal screening: Secondary | ICD-10-CM | POA: Diagnosis not present

## 2018-06-05 ENCOUNTER — Inpatient Hospital Stay (HOSPITAL_COMMUNITY)
Admission: AD | Admit: 2018-06-05 | Discharge: 2018-06-05 | Disposition: A | Discharge status: Home / Self Care | Payer: Medicaid Other | Source: Ambulatory Visit | Attending: Obstetrics and Gynecology | Admitting: Obstetrics and Gynecology

## 2018-06-05 ENCOUNTER — Other Ambulatory Visit: Payer: Self-pay

## 2018-06-05 ENCOUNTER — Inpatient Hospital Stay (HOSPITAL_BASED_OUTPATIENT_CLINIC_OR_DEPARTMENT_OTHER): Payer: Medicaid Other

## 2018-06-05 ENCOUNTER — Encounter (HOSPITAL_COMMUNITY): Payer: Self-pay | Admitting: *Deleted

## 2018-06-05 DIAGNOSIS — O289 Unspecified abnormal findings on antenatal screening of mother: Secondary | ICD-10-CM | POA: Diagnosis not present

## 2018-06-05 DIAGNOSIS — O365930 Maternal care for other known or suspected poor fetal growth, third trimester, not applicable or unspecified: Secondary | ICD-10-CM | POA: Diagnosis not present

## 2018-06-05 DIAGNOSIS — Z87891 Personal history of nicotine dependence: Secondary | ICD-10-CM | POA: Insufficient documentation

## 2018-06-05 DIAGNOSIS — O288 Other abnormal findings on antenatal screening of mother: Secondary | ICD-10-CM | POA: Diagnosis not present

## 2018-06-05 DIAGNOSIS — Z3A38 38 weeks gestation of pregnancy: Secondary | ICD-10-CM

## 2018-06-05 DIAGNOSIS — Z3689 Encounter for other specified antenatal screening: Secondary | ICD-10-CM

## 2018-06-05 NOTE — MAU Note (Signed)
Pt presents from office for non reactive NST and a decrease in fetal movement. Abdominal cramping denies any VB or LOF

## 2018-06-05 NOTE — MAU Provider Note (Signed)
History     CSN: [id number]  Arrival date and time: 06/05/18 1200  Chief Complaint  Patient presents with  . non reactive NST   G1 @38 .3 wks sent from office for NRNST. Reports good FM. No ctx, VB, or LOF. Her pregnancy is complicated by IUGR.  OB History    Gravida  1   Para  0   Term  0   Preterm  0   AB  0   Living  0     SAB  0   TAB  0   Ectopic  0   Multiple  0   Live Births              Past Medical History:  Diagnosis Date  . Anxiety   . Bipolar disease, manic (HCC)   . Cyclical vomiting syndrome   . Depression   . Schizophrenia [facility](HCC)     Past Surgical History:  Procedure Laterality Date  . TONSILLECTOMY      Family History  Problem Relation Age of Onset  . Hypertension Mother     Social History   Tobacco Use  . Smoking status: Former Smoker    Packs/day: 0.00    Types: Cigarettes    Last attempt to quit: 10/08/2017    Years since quitting: 0.6  . Smokeless tobacco: Never Used  Substance Use Topics  . Alcohol use: Not Currently    Comment: varies from few shots of liquor to "whole bottle" daily  . Drug use: Yes    Types: Marijuana    Comment:  TODAY 05-25-18    Allergies: No Known Allergies  Medications Prior to Admission  Medication Sig Dispense Refill Last Dose  . doxylamine, Sleep, (UNISOM) 25 MG tablet Take 25 mg by mouth at bedtime as needed (nausea).   06/05/2018 at Unknown time  . FOLIC ACID PO Take 1 tablet by mouth daily.   06/05/2018 at Unknown time  . promethazine (PHENERGAN) 25 MG tablet Take 0.5-1 tablets (12.5-25 mg total) by mouth every 6 (six) hours as needed. (Patient taking differently: Take 12.5-25 mg by mouth every 6 (six) hours as needed for nausea or vomiting. ) 30 tablet 0 06/05/2018 at Unknown time    Review of Systems  Gastrointestinal: Negative for abdominal pain.  Genitourinary: Negative for vaginal bleeding.   Physical Exam   Blood pressure 122/69, pulse (!) 103, temperature 98.3 F (36.8  C), resp. rate 16, last menstrual period 09/09/2017, SpO2 100 %.  Physical Exam  Constitutional: She is oriented to person, place, and time. She appears well-developed and well-nourished. No distress.  HENT:  Head: Normocephalic and atraumatic.  Neck: Normal range of motion.  Respiratory: Effort normal. No respiratory distress.  Musculoskeletal: Normal range of motion.  Neurological: She is alert and oriented to person, place, and time.  Psychiatric: She has a normal mood and affect.  EFM: 140 bpm, mod variability, + accels, no decels Toco: rare  MAU Course  Procedures  MDM BPP ordered. BPP 8/8, AFI 16.73 cm. Dr. Estanislado Pandyivard notified of BPP and NST. Stable for discharre home.   Assessment and Plan  [redacted] weeks gestation Reactive NST  [facility] Est[facility]isl[facility]ard [facility]  Allergies as of 06/05/2018   No Known Allergies     Medication List    TAKE these medications   doxylamine (Sleep) 25 MG tablet Commonly known as:  UNISOM Take 25 mg by mouth at bedtime as needed (nausea).  FOLIC ACID PO Take 1 tablet by mouth daily.   promethazine 25 MG tablet Commonly known as:  PHENERGAN Take 0.5-1 tablets (12.5-25 mg total) by mouth every 6 (six) hours as needed. What changed:  reasons to take this      Donette Larry, CNM 06/05/2018, 2:03 PM

## 2018-06-05 NOTE — Discharge Instructions (Signed)

## 2018-06-12 ENCOUNTER — Encounter (HOSPITAL_COMMUNITY): Payer: Self-pay

## 2018-06-12 ENCOUNTER — Inpatient Hospital Stay (HOSPITAL_COMMUNITY)
Admission: AD | Admit: 2018-06-12 | Discharge: 2018-06-15 | DRG: 806 | Disposition: A | Discharge status: Home / Self Care | Payer: Medicaid Other | Attending: Obstetrics and Gynecology | Admitting: Obstetrics and Gynecology

## 2018-06-12 DIAGNOSIS — O365930 Maternal care for other known or suspected poor fetal growth, third trimester, not applicable or unspecified: Principal | ICD-10-CM | POA: Diagnosis present

## 2018-06-12 DIAGNOSIS — Z3A39 39 weeks gestation of pregnancy: Secondary | ICD-10-CM

## 2018-06-12 DIAGNOSIS — R030 Elevated blood-pressure reading, without diagnosis of hypertension: Secondary | ICD-10-CM

## 2018-06-12 DIAGNOSIS — F1210 Cannabis abuse, uncomplicated: Secondary | ICD-10-CM | POA: Diagnosis present

## 2018-06-12 DIAGNOSIS — Z87891 Personal history of nicotine dependence: Secondary | ICD-10-CM

## 2018-06-12 DIAGNOSIS — O99324 Drug use complicating childbirth: Secondary | ICD-10-CM | POA: Diagnosis present

## 2018-06-12 NOTE — MAU Note (Signed)
CTX every 3-4 mins.  No LOF/VB.  + FM.  Reports GBS negative.  Pregnancy complicated by IUGR and current every day marijuana smoker-last smoked earlier today.

## 2018-06-13 ENCOUNTER — Inpatient Hospital Stay (HOSPITAL_COMMUNITY): Payer: Medicaid Other | Admitting: Anesthesiology

## 2018-06-13 ENCOUNTER — Encounter (HOSPITAL_COMMUNITY): Payer: Self-pay

## 2018-06-13 DIAGNOSIS — F1210 Cannabis abuse, uncomplicated: Secondary | ICD-10-CM | POA: Diagnosis present

## 2018-06-13 DIAGNOSIS — O365930 Maternal care for other known or suspected poor fetal growth, third trimester, not applicable or unspecified: Secondary | ICD-10-CM | POA: Diagnosis present

## 2018-06-13 DIAGNOSIS — R030 Elevated blood-pressure reading, without diagnosis of hypertension: Secondary | ICD-10-CM

## 2018-06-13 DIAGNOSIS — Z87891 Personal history of nicotine dependence: Secondary | ICD-10-CM | POA: Diagnosis not present

## 2018-06-13 DIAGNOSIS — O26893 Other specified pregnancy related conditions, third trimester: Secondary | ICD-10-CM | POA: Diagnosis not present

## 2018-06-13 DIAGNOSIS — Z3A39 39 weeks gestation of pregnancy: Secondary | ICD-10-CM | POA: Diagnosis not present

## 2018-06-13 DIAGNOSIS — O99324 Drug use complicating childbirth: Secondary | ICD-10-CM | POA: Diagnosis present

## 2018-06-13 LAB — COMPREHENSIVE METABOLIC PANEL
ALBUMIN: 3.5 g/dL (ref 3.5–5.0)
ALK PHOS: 182 U/L — AB (ref 38–126)
ALT: 30 U/L (ref 0–44)
ANION GAP: 11 (ref 5–15)
AST: 31 U/L (ref 15–41)
BUN: 11 mg/dL (ref 6–20)
CALCIUM: 8.8 mg/dL — AB (ref 8.9–10.3)
CHLORIDE: 104 mmol/L (ref 98–111)
CO2: 22 mmol/L (ref 22–32)
Creatinine, Ser: 0.78 mg/dL (ref 0.44–1.00)
GFR calc non Af Amer: >60 mL/min (ref >60)
GLUCOSE: 77 mg/dL (ref 70–99)
Potassium: 3.4 mmol/L — ABNORMAL LOW (ref 3.5–5.1)
SODIUM: 137 mmol/L (ref 135–145)
Total Bilirubin: 0.9 mg/dL (ref 0.3–1.2)
Total Protein: 7 g/dL (ref 6.5–8.1)

## 2018-06-13 LAB — URINALYSIS, ROUTINE W REFLEX MICROSCOPIC
BILIRUBIN URINE: NEGATIVE
Bacteria, UA: NONE SEEN
Glucose, UA: NEGATIVE mg/dL
Ketones, ur: 80 mg/dL — AB
NITRITE: NEGATIVE
PH: 7 (ref 5.0–8.0)
Protein, ur: 30 mg/dL — AB
SPECIFIC GRAVITY, URINE: 1.029 (ref 1.005–1.030)

## 2018-06-13 LAB — PROTEIN / CREATININE RATIO, URINE
Creatinine, Urine: 252 mg/dL
PROTEIN CREATININE RATIO: 0.16 mg/mg{creat} — AB (ref 0.00–0.15)
TOTAL PROTEIN, URINE: 41 mg/dL

## 2018-06-13 LAB — CBC
HCT: 42.9 % (ref 36.0–46.0)
HEMOGLOBIN: 14.3 g/dL (ref 12.0–15.0)
MCH: 34.3 pg — ABNORMAL HIGH (ref 26.0–34.0)
MCHC: 33.3 g/dL (ref 30.0–36.0)
MCV: 102.9 fL — ABNORMAL HIGH (ref 80.0–100.0)
PLATELETS: 192 10*3/uL (ref 150–400)
RBC: 4.17 MIL/uL (ref 3.87–5.11)
RDW: 13.6 % (ref 11.5–15.5)
WBC: 14.9 10*3/uL — ABNORMAL HIGH (ref 4.0–10.5)
nRBC: 0 % (ref 0.0–0.2)

## 2018-06-13 LAB — RAPID URINE DRUG SCREEN, HOSP PERFORMED
AMPHETAMINES: NOT DETECTED
BARBITURATES: NOT DETECTED
Benzodiazepines: NOT DETECTED
Cocaine: NOT DETECTED
OPIATES: NOT DETECTED
TETRAHYDROCANNABINOL: POSITIVE — AB

## 2018-06-13 LAB — TYPE AND SCREEN
ABO/RH(D): A POS
Antibody Screen: NEGATIVE

## 2018-06-13 LAB — RPR: RPR: NONREACTIVE

## 2018-06-13 MED ORDER — DIPHENHYDRAMINE HCL 25 MG PO CAPS: 25.0000 mg | ORAL_CAPSULE | Freq: Four times a day (QID) | ORAL | Status: DC | PRN

## 2018-06-13 MED ORDER — FENTANYL CITRATE (PF) 100 MCG/2ML IJ SOLN
50.0000 ug | INTRAMUSCULAR | Status: DC | PRN
  Administered 2018-06-13: 100 ug via INTRAVENOUS
  Filled 2018-06-13: qty 2

## 2018-06-13 MED ORDER — PRENATAL MULTIVITAMIN CH
1.0000 | ORAL_TABLET | Freq: Every day | ORAL | Status: DC
  Administered 2018-06-14 – 2018-06-15 (×2): 1 via ORAL
  Filled 2018-06-13 (×2): qty 1

## 2018-06-13 MED ORDER — ONDANSETRON HCL 4 MG/2ML IJ SOLN: 4.0000 mg | Freq: Four times a day (QID) | INTRAMUSCULAR | Status: DC | PRN

## 2018-06-13 MED ORDER — TETANUS-DIPHTH-ACELL PERTUSSIS 5-2.5-18.5 LF-MCG/0.5 IM SUSP: 0.5000 mL | Freq: Once | INTRAMUSCULAR | Status: DC

## 2018-06-13 MED ORDER — ZOLPIDEM TARTRATE 5 MG PO TABS
5.0000 mg | ORAL_TABLET | Freq: Every evening | ORAL | Status: DC | PRN
  Administered 2018-06-14 (×2): 5 mg via ORAL
  Filled 2018-06-13 (×2): qty 1

## 2018-06-13 MED ORDER — LACTATED RINGERS IV SOLN
INTRAVENOUS | Status: DC
  Administered 2018-06-13: 01:00:00 via INTRAVENOUS
  Administered 2018-06-13: 125 mL/h via INTRAVENOUS

## 2018-06-13 MED ORDER — DIBUCAINE 1 % RE OINT: 1.0000 "application " | TOPICAL_OINTMENT | RECTAL | Status: DC | PRN

## 2018-06-13 MED ORDER — EPHEDRINE 5 MG/ML INJ
10.0000 mg | INTRAVENOUS | Status: DC | PRN
  Filled 2018-06-13: qty 2

## 2018-06-13 MED ORDER — OXYTOCIN 40 UNITS IN LACTATED RINGERS INFUSION - SIMPLE MED
1.0000 m[IU]/min | INTRAVENOUS | Status: DC
  Administered 2018-06-13: 1 m[IU]/min via INTRAVENOUS

## 2018-06-13 MED ORDER — PROMETHAZINE HCL 25 MG/ML IJ SOLN
12.5000 mg | Freq: Once | INTRAMUSCULAR | Status: AC
  Administered 2018-06-13: 12.5 mg via INTRAVENOUS
  Filled 2018-06-13: qty 1

## 2018-06-13 MED ORDER — SOD CITRATE-CITRIC ACID 500-334 MG/5ML PO SOLN: 30.0000 mL | ORAL | Status: DC | PRN

## 2018-06-13 MED ORDER — ONDANSETRON HCL 4 MG/2ML IJ SOLN: 4.0000 mg | INTRAMUSCULAR | Status: DC | PRN

## 2018-06-13 MED ORDER — WITCH HAZEL-GLYCERIN EX PADS: 1.0000 "application " | MEDICATED_PAD | CUTANEOUS | Status: DC | PRN

## 2018-06-13 MED ORDER — PHENYLEPHRINE 40 MCG/ML (10ML) SYRINGE FOR IV PUSH (FOR BLOOD PRESSURE SUPPORT)
80.0000 ug | PREFILLED_SYRINGE | INTRAVENOUS | Status: DC | PRN
  Filled 2018-06-13: qty 5

## 2018-06-13 MED ORDER — DIPHENHYDRAMINE HCL 50 MG/ML IJ SOLN: 12.5000 mg | INTRAMUSCULAR | Status: DC | PRN

## 2018-06-13 MED ORDER — LACTATED RINGERS IV SOLN: 500.0000 mL | Freq: Once | INTRAVENOUS | Status: DC

## 2018-06-13 MED ORDER — FERROUS SULFATE 325 (65 FE) MG PO TABS
325.0000 mg | ORAL_TABLET | Freq: Two times a day (BID) | ORAL | Status: DC
  Administered 2018-06-13 – 2018-06-15 (×4): 325 mg via ORAL
  Filled 2018-06-13 (×4): qty 1

## 2018-06-13 MED ORDER — MEASLES, MUMPS & RUBELLA VAC IJ SOLR
0.5000 mL | Freq: Once | INTRAMUSCULAR | Status: DC
  Filled 2018-06-13: qty 0.5

## 2018-06-13 MED ORDER — OXYCODONE-ACETAMINOPHEN 5-325 MG PO TABS: 2.0000 | ORAL_TABLET | ORAL | Status: DC | PRN

## 2018-06-13 MED ORDER — LACTATED RINGERS AMNIOINFUSION
INTRAVENOUS | Status: DC
  Administered 2018-06-13: 150 mL/h via INTRAUTERINE
  Filled 2018-06-13 (×2): qty 1000

## 2018-06-13 MED ORDER — OXYCODONE-ACETAMINOPHEN 5-325 MG PO TABS
1.0000 | ORAL_TABLET | ORAL | Status: DC | PRN
  Administered 2018-06-13: 1 via ORAL
  Filled 2018-06-13: qty 1

## 2018-06-13 MED ORDER — ONDANSETRON HCL 4 MG PO TABS: 4.0000 mg | ORAL_TABLET | ORAL | Status: DC | PRN

## 2018-06-13 MED ORDER — FENTANYL 2.5 MCG/ML BUPIVACAINE 1/10 % EPIDURAL INFUSION (WH - ANES)
INTRAMUSCULAR | Status: AC
  Filled 2018-06-13: qty 100

## 2018-06-13 MED ORDER — OXYTOCIN BOLUS FROM INFUSION
500.0000 mL | Freq: Once | INTRAVENOUS | Status: AC
  Administered 2018-06-13: 500 mL via INTRAVENOUS

## 2018-06-13 MED ORDER — OXYTOCIN 40 UNITS IN LACTATED RINGERS INFUSION - SIMPLE MED
1.0000 m[IU]/min | INTRAVENOUS | Status: DC
  Filled 2018-06-13: qty 1000

## 2018-06-13 MED ORDER — SIMETHICONE 80 MG PO CHEW: 80.0000 mg | CHEWABLE_TABLET | ORAL | Status: DC | PRN

## 2018-06-13 MED ORDER — TERBUTALINE SULFATE 1 MG/ML IJ SOLN
0.2500 mg | Freq: Once | INTRAMUSCULAR | Status: DC | PRN
  Filled 2018-06-13: qty 1

## 2018-06-13 MED ORDER — LIDOCAINE HCL (PF) 1 % IJ SOLN
30.0000 mL | INTRAMUSCULAR | Status: DC | PRN
  Filled 2018-06-13: qty 30

## 2018-06-13 MED ORDER — COCONUT OIL OIL: 1.0000 "application " | TOPICAL_OIL | Status: DC | PRN

## 2018-06-13 MED ORDER — ACETAMINOPHEN 325 MG PO TABS: 650.0000 mg | ORAL_TABLET | ORAL | Status: DC | PRN

## 2018-06-13 MED ORDER — LACTATED RINGERS IV SOLN
500.0000 mL | INTRAVENOUS | Status: DC | PRN
  Administered 2018-06-13 (×2): 500 mL via INTRAVENOUS

## 2018-06-13 MED ORDER — LIDOCAINE HCL (PF) 1 % IJ SOLN
INTRAMUSCULAR | Status: DC | PRN
  Administered 2018-06-13: 8 mL via EPIDURAL

## 2018-06-13 MED ORDER — PHENYLEPHRINE 40 MCG/ML (10ML) SYRINGE FOR IV PUSH (FOR BLOOD PRESSURE SUPPORT)
PREFILLED_SYRINGE | INTRAVENOUS | Status: AC
  Filled 2018-06-13: qty 20

## 2018-06-13 MED ORDER — SENNOSIDES-DOCUSATE SODIUM 8.6-50 MG PO TABS
2.0000 | ORAL_TABLET | ORAL | Status: DC
  Administered 2018-06-14 – 2018-06-15 (×2): 2 via ORAL
  Filled 2018-06-13 (×2): qty 2

## 2018-06-13 MED ORDER — IBUPROFEN 600 MG PO TABS
600.0000 mg | ORAL_TABLET | Freq: Four times a day (QID) | ORAL | Status: DC
  Administered 2018-06-13 – 2018-06-15 (×8): 600 mg via ORAL
  Filled 2018-06-13 (×8): qty 1

## 2018-06-13 MED ORDER — FLEET ENEMA 7-19 GM/118ML RE ENEM: 1.0000 | ENEMA | RECTAL | Status: DC | PRN

## 2018-06-13 MED ORDER — ACETAMINOPHEN 325 MG PO TABS
650.0000 mg | ORAL_TABLET | ORAL | Status: DC | PRN
  Administered 2018-06-14 (×2): 650 mg via ORAL
  Filled 2018-06-13 (×3): qty 2

## 2018-06-13 MED ORDER — BENZOCAINE-MENTHOL 20-0.5 % EX AERO
1.0000 "application " | INHALATION_SPRAY | CUTANEOUS | Status: DC | PRN
  Administered 2018-06-13: 1 via TOPICAL
  Filled 2018-06-13: qty 56

## 2018-06-13 MED ORDER — FENTANYL 2.5 MCG/ML BUPIVACAINE 1/10 % EPIDURAL INFUSION (WH - ANES)
14.0000 mL/h | INTRAMUSCULAR | Status: DC | PRN
  Administered 2018-06-13 (×2): 14 mL/h via EPIDURAL
  Filled 2018-06-13: qty 100

## 2018-06-13 MED ORDER — OXYTOCIN 40 UNITS IN LACTATED RINGERS INFUSION - SIMPLE MED: 2.5000 [IU]/h | INTRAVENOUS | Status: DC

## 2018-06-13 NOTE — Anesthesia Procedure Notes (Signed)
Epidural Patient location during procedure: OB Start time: 06/13/2018 2:10 AM End time: 06/13/2018 2:15 AM  Staffing Anesthesiologist: Bethena Midgetddono, Delenn Ahn, MD  Preanesthetic Checklist Completed: patient identified, site marked, surgical consent, pre-op evaluation, timeout performed, IV checked, risks and benefits discussed and monitors and equipment checked  Epidural Patient position: sitting Prep: site prepped and draped and DuraPrep Patient monitoring: continuous pulse ox and blood pressure Approach: midline Location: L3-L4 Injection technique: LOR air  Needle:  Needle type: Tuohy  Needle gauge: 17 G Needle length: 9 cm and 9 Needle insertion depth: 4 cm Catheter type: closed end flexible Catheter size: 19 Gauge Catheter at skin depth: 9 cm Test dose: negative  Assessment Events: blood not aspirated, injection not painful, no injection resistance, negative IV test and no paresthesia
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
## 2018-06-13 NOTE — Anesthesia Preprocedure Evaluation (Signed)
Anesthesia Evaluation  Patient identified by MRN, date of birth, ID band Patient awake    Reviewed: Allergy & Precautions, H&P , NPO status , Patient's Chart, lab work & pertinent test results, reviewed documented beta blocker date and time   Airway Mallampati: II  TM Distance: >3 FB Neck ROM: full    Dental no notable dental hx.    Pulmonary neg pulmonary ROS, former smoker,    Pulmonary exam normal breath sounds clear to auscultation       Cardiovascular negative cardio ROS Normal cardiovascular exam Rhythm:regular Rate:Normal     Neuro/Psych negative neurological ROS  negative psych ROS   GI/Hepatic negative GI ROS, Neg liver ROS,   Endo/Other  negative endocrine ROS  Renal/GU negative Renal ROS  negative genitourinary   Musculoskeletal   Abdominal   Peds  Hematology negative hematology ROS (+)   Anesthesia Other Findings   Reproductive/Obstetrics (+) Pregnancy                             Anesthesia Physical Anesthesia Plan  ASA: II  Anesthesia Plan: Epidural   Post-op Pain Management:    Induction:   PONV Risk Score and Plan:   Airway Management Planned:   Additional Equipment:   Intra-op Plan:   Post-operative Plan:   Informed Consent: I have reviewed the patients History and Physical, chart, labs and discussed the procedure including the risks, benefits and alternatives for the proposed anesthesia with the patient or authorized representative who has indicated his/her understanding and acceptance.     Dental Advisory Given  Plan Discussed with:   Anesthesia Plan Comments: (Labs checked- platelets confirmed with RN in room. Fetal heart tracing, per RN, reported to be stable enough for sitting procedure. Discussed epidural, and patient consents to the procedure:  included risk of possible headache,backache, failed block, allergic reaction, and nerve injury. This  patient was asked if she had any questions or concerns before the procedure started.)        Anesthesia Quick Evaluation  

## 2018-06-13 NOTE — Progress Notes (Signed)
Angela CoonsKeira D N Christian is a 22 y.o. G1P0000 at [id number] for active labor  Subjective:  Comfortable with  Epidural Contractions every 2-4 minutes, lasting 45 seconds palpating moderate    Objective: BP 95/73   Pulse 74   Temp 98.1 F (36.7 C) (Oral)   Resp 16   Ht 5\' 5"  (1.651 m)   Wt 66.2 kg   LMP 09/09/2017   SpO2 100%   BMI 24.30 kg/m  No intake/output data recorded. No intake/output data recorded.  FHT: Category 1 SVE:   Dilation: 5 Effacement (%): 90 Station: 0 LOP Exam by:: MD Cortlynn Hollinsworth  Labs: Lab Results  Component Value Date   WBC 14.9 (H) 06/13/2018   HGB 14.3 06/13/2018   HCT 42.9 06/13/2018   MCV 102.9 (H) 06/13/2018   PLT 192 06/13/2018    Assessment / Plan: 39+4 weeks in active labor SGA with last EFW at 5 lbs 13 oz IUPC inserted/scalp lead positionned Fetal Wellbeing: reassuring Anticipated MOD:  NSVD  Dois DavenportSandra A Ziyana Morikawa 06/13/2018, 10:03 AM

## 2018-06-13 NOTE — Progress Notes (Signed)
Post Partum Day 1  Subjective: no complaints, up ad lib, voiding and tolerating PO. Patient states her mood is good, she is just tired. She will be a candidate to be followed closely for postpartum depression because of her history of bipolar disorder.   Objective: Vitals:   06/13/18 1500 06/13/18 1811 06/13/18 2159 06/14/18 0300  BP: 126/70 126/63 127/69 109/68  Pulse: 62 62 75 87  Resp: 18 16 18 16   Temp: 98.3 F (36.8 C) 99.3 F (37.4 C) 98.1 F (36.7 C) 98.3 F (36.8 C)  TempSrc:  Oral  Oral  SpO2:    99%  Weight:      Height:       Physical Exam:  General: alert and cooperative Lochia: appropriate Uterine Fundus: firm Incision: n/a DVT Evaluation: No evidence of DVT seen on physical exam. Negative Homan's sign. No cords or calf tenderness. No significant calf/ankle edema.  Recent Labs    06/13/18 0002 06/14/18 0550  HGB 14.3 13.2  HCT 42.9 40.3    Assessment/Plan: Plan for discharge tomorrow   LOS: 1 day   Janeece Riggersllis K Tomasa Dobransky 06/14/2018, 6:39 AM

## 2018-06-13 NOTE — H&P (Signed)
Angela Christian is a 22 y.o. female, Angela Christian is a 22 y.o. G1P0000 at [redacted]w[redacted]d who is being admitted for latent labor and pain control. DX with IUGR <3% AC lag. Had elevated Bps in MAU 152/84 & 138/91 . Patient denies hx of hypertension. Denies headache, visual changes, or epigastric pain. Endorses n/v which is her baseline d/t cyclic vomiting syndrome. Cervix was 2 cm in office last week. Requesting pain medication for contractions. Denies vaginal bleeding, or LOF. Being followed for IUGR. Normal fetal movement. Pt smokes MJ on daily basis. H/O schizophrenia, bipolar disorder, not on meds.   Pregnancy Problems cyclical vomiting syndrome  fetal growth restriction Referred to MFM, AC < 3%ile at 30 weeks, BPP/dopplers and NST weekly. F/u growth at [facility] 05/10/18 WNL, AC lag still present at < 3%ile, FL 9%ile.) Cannabis abuse (Postive u-tox @ 30 weeks) Medications Afluria Qd 2019-20(22yr up)(PF)  glycopyrrolate  Phenergan  Prenatal  promethazine  Unisom (diphenhydramine)    Patient Active Problem List   Diagnosis Date Noted  . Indication for care in labor and delivery, antepartum 06/13/2018  . Cannabis abuse, continuous 04/02/2018  . Gastroesophageal reflux disease 04/02/2018  . [redacted] weeks gestation of pregnancy 04/02/2018  . Vomiting pregnancy 04/02/2018  . MDD (major depressive disorder) 09/10/2015  . Major depressive disorder, recurrent episode (HCC) 09/10/2015  . Polysubstance abuse (HCC) 09/10/2015  . Unspecified mood (affective) disorder (HCC) 09/10/2015  . Secondary amenorrhea 02/27/2015  . Cyclical vomiting syndrome 02/10/2015   Medications Prior to Admission  Medication Sig Dispense Refill Last Dose  . doxylamine, Sleep, (UNISOM) 25 MG tablet Take 25 mg by mouth at bedtime as needed (nausea).   06/05/2018 at Unknown time  . FOLIC ACID PO Take 1 tablet by mouth daily.   06/05/2018 at Unknown time  . promethazine (PHENERGAN) 25 MG tablet Take 0.5-1 tablets (12.5-25 mg total)  by mouth every 6 (six) hours as needed. (Patient taking differently: Take 12.5-25 mg by mouth every 6 (six) hours as needed for nausea or vomiting. ) 30 tablet 0 06/05/2018 at Unknown time    Past Medical History:  Diagnosis Date  . Anxiety   . Bipolar disease, manic (HCC)   . Cyclical vomiting syndrome   . Depression   . Schizophrenia (HCC)      No current facility-administered medications on file prior to encounter.    Current Outpatient Medications on File Prior to Encounter  Medication Sig Dispense Refill  . doxylamine, Sleep, (UNISOM) 25 MG tablet Take 25 mg by mouth at bedtime as needed (nausea).    . FOLIC ACID PO Take 1 tablet by mouth daily.    . promethazine (PHENERGAN) 25 MG tablet Take 0.5-1 tablets (12.5-25 mg total) by mouth every 6 (six) hours as needed. (Patient taking differently: Take 12.5-25 mg by mouth every 6 (six) hours as needed for nausea or vomiting. ) 30 tablet 0     No Known Allergies  History of present pregnancy: Pt Info/Preference:  Screening/Consents:  Labs:   EDD: Estimated Date of Delivery: 06/16/18  Establised: Patient's last menstrual period was 09/09/2017.  Anatomy Scan: Date: 02/03/2018 Placenta Location: anterior Genetic Screen: Panoroma:Low risk Female AFP:  First Tri: Quad:  Office: [facility]           First PNV: 12.4 wg Blood Type --/--/A POS (11/26 0002)  Language: English Last PNV: 39.2 wg Rhogam    Flu Vaccine:  UTD   Antibody NEG (11/26 0002)  TDaP vaccine UTD  GTT: Early: N/A Third Trimester: Normal  Feeding Plan: Breast BTL: No Rubella: Immune (05/22 0000)  Contraception: ??? VBAC: No RPR: Nonreactive (05/22 0000)   Circumcision: Female   HBsAg: Negative (05/22 0000)  Pediatrician:  TS   HIV: Non-reactive (05/22 0000)   Prenatal Classes: No Additional [place]: See below, week BPP with dopplers GBS: Negative (10/28 0000)(For PCN allergy, check sensitivities)       Chlamydia: Neg    MFM Referral/Consult:  GC: Neg  Support Person: BF   PAP:  ???  Pain Management: IV pain meds, then may epidural later Neonatologist Referral:  Hgb Electrophoresis:  AA  Birth Plan: None   Hgb NOB: 13    28W: 13.8  05/24/2018: last US  OB History    Gravida  1   Para  0   Term  0   Preterm  0   AB  0   Living  0     SAB  0   TAB  0   Ectopic  0   Multiple  0   Live Births             Past Medical History:  Diagnosis Date  . Anxiety   . Bipolar disease, manic (HCC)   . Cyclical vomiting syndrome   . Depression   . Schizophrenia [facility])    Past Surgical History:  Procedure Laterality Date  . TONSILLECTOMY     Family History: family history includes Hypertension in her mother. Social History:  reports that she quit smoking about 8 months ago. Her smoking use included cigarettes. She smoked 0.00 packs per day. She has never used smokeless tobacco. She reports that she drank alcohol. She reports that she has current or past drug history. Drug: Marijuana.   Prenatal Transfer Tool  Maternal Diabetes: No Genetic Screening: Normal Maternal Ultrasounds/Referrals: Abnormal:  Findings:   IUGR Fetal Ultrasounds or other Referrals:  Referred to Materal Fetal Medicine  Maternal Substance Abuse:  Yes:  Type: Marijuana Significant Maternal Medications:  None Significant Maternal Lab Results: None  ROS:  Review of Systems  Constitutional: Negative.   HENT: Negative.   Eyes: Negative.   Respiratory: Negative.   Cardiovascular: Negative.   Gastrointestinal: Positive for abdominal pain.  Genitourinary: Negative.   Musculoskeletal: Negative.   Skin: Negative.   Neurological: Negative.   Endo/Heme/Allergies: Negative.   Psychiatric/Behavioral: Negative.      Physical Exam: BP 133/79   Pulse (!) 54   Temp 97.6 F (36.4 C) (Oral)   Resp 16   Ht 5\' 5"  (1.651 m)   Wt 66.2 kg   LMP 09/09/2017   SpO2 94%   BMI 24.30 kg/m   Physical Exam  Constitutional: She is well-developed, well-nourished, and in no distress.  HENT:    Head: Normocephalic and atraumatic.  Eyes: Pupils are equal, round, and reactive to light. Conjunctivae are normal.  Neck: Normal range of motion. Neck supple.  Cardiovascular: Normal rate and regular rhythm.  Pulmonary/Chest: Effort normal and breath sounds normal.  Abdominal: Soft. Bowel sounds are normal.  Genitourinary:  Genitourinary Comments: Uterus soft non-tender, gravida less than dates.   Nursing note and vitals reviewed.    NST: FHR baseline 120 bpm, Variability: moderate, Accelerations:present, Decelerations:  Absent= Cat 1/Reactive UC:   irregular, every 3-5 minutes SVE:   Dilation: 3.5 Effacement (%): 70 Station: -2 Exam by:: [employer], RN, vertex verified by fetal sutures.  Leopold's: Position vertex, EFW 6 via leopold's.   Labs: Results for orders  placed or performed during the hospital encounter of 06/12/18 (from the past 24 hour(s))  CBC     Status: Abnormal   Collection Time: 06/13/18 12:02 AM  Result Value Ref Range   WBC 14.9 (H) 4.0 - 10.5 K/uL   RBC 4.17 3.87 - 5.11 MIL/uL   Hemoglobin 14.3 12.0 - 15.0 g/dL   HCT 16.1 09.6 - 04.5 %   MCV 102.9 (H) 80.0 - 100.0 fL   MCH 34.3 (H) 26.0 - 34.0 pg   MCHC 33.3 30.0 - 36.0 g/dL   RDW 40.9 81.1 - 91.4 %   Platelets 192 150 - 400 K/uL   nRBC 0.0 0.0 - 0.2 %  Comprehensive metabolic panel     Status: Abnormal   Collection Time: 06/13/18 12:02 AM  Result Value Ref Range   Sodium 137 135 - 145 mmol/L   Potassium 3.4 (L) 3.5 - 5.1 mmol/L   Chloride 104 98 - 111 mmol/L   CO2 22 22 - 32 mmol/L   Glucose, Bld 77 70 - 99 mg/dL   BUN 11 6 - 20 mg/dL   Creatinine, Ser 7.82 0.44 - 1.00 mg/dL   Calcium 8.8 (L) 8.9 - 10.3 mg/dL   Total Protein 7.0 6.5 - 8.1 g/dL   Albumin 3.5 3.5 - 5.0 g/dL   AST 31 15 - 41 U/L   ALT 30 0 - 44 U/L   Alkaline Phosphatase 182 (H) 38 - 126 U/L   Total Bilirubin 0.9 0.3 - 1.2 mg/dL   GFR calc non Af Amer >60 >60 mL/min   GFR calc Af Amer >60 >60 mL/min   Anion gap 11 5 - 15  Type  and screen [facility] [place]     Status: None   Collection Time: 06/13/18 12:02 AM  Result Value Ref Range   ABO/RH(D) A POS    Antibody Screen NEG    Sample Expiration      06/16/2018 Performed at [facility], [address] [place], [place] [address]   Protein / creatinine ratio, urine     Status: Abnormal   Collection Time: 06/13/18 12:23 AM  Result Value Ref Range   Creatinine, Urine 252.00 mg/dL   Total Protein, Urine 41 mg/dL   Protein Creatinine Ratio 0.16 (H) 0.00 - 0.15 mg/mg[Cre]  Urinalysis, Routine w reflex microscopic     Status: Abnormal   Collection Time: 06/13/18 12:23 AM  Result Value Ref Range   Color, Urine YELLOW YELLOW   APPearance CLEAR CLEAR   Specific Gravity, Urine 1.029 1.005 - 1.030   pH 7.0 5.0 - 8.0   Glucose, UA NEGATIVE NEGATIVE mg/dL   Hgb urine dipstick SMALL (A) NEGATIVE   Bilirubin Urine NEGATIVE NEGATIVE   Ketones, ur 80 (A) NEGATIVE mg/dL   Protein, ur 30 (A) NEGATIVE mg/dL   Nitrite NEGATIVE NEGATIVE   Leukocytes, UA SMALL (A) NEGATIVE   RBC / HPF 0-5 0 - 5 RBC/hpf   WBC, UA 11-20 0 - 5 WBC/hpf   Bacteria, UA NONE SEEN NONE SEEN   Squamous Epithelial / LPF 0-5 0 - 5   Mucus PRESENT     Imaging:  [place] Mfm Fetal Bpp Wo Non Stress  Result Date: 06/05/2018 ----------------------------------------------------------------------  OBSTETRICS REPORT                       (Signed Final 06/05/2018 04:44 pm) ---------------------------------------------------------------------- Patient Info  ID #:       [id number]  D.O.B.:  29-Oct-1995 (22 yrs)  Name:       Angela Christian               Visit Date: 06/05/2018 12:51 pm ---------------------------------------------------------------------- Performed By  Performed By:     Aundra MilletKasie Kiser            Ref. Address:      [facility]                    BS,RDMS                                                              Obstetrics &                                                               Gynecology                                                              [address]                                                              [address]                                                              [address]                                                              [place], [place]                                                              [id number]  Attending:        Lin Landsmanorenthian Booker      Secondary Phy.:    MAU Nursing-                    MD  MAU/Triage  Referred By:      Marylene Land                 Location:          [facility]                    ROBERTS MD ---------------------------------------------------------------------- Orders   #  Description                          Code         Ordered By   1  [place] MFM FETAL BPP WO NON              76819.01     MELANIE BHAMBRI      STRESS  ----------------------------------------------------------------------   #  Order #                    Accession #                 Episode #   1  [id number]                  [id number]                  [id number]  ---------------------------------------------------------------------- Indications   Non-reactive NST                               O28.9   Marijuana Abuse (+ UDS 03/11/18)   [redacted] weeks gestation of pregnancy                Z3A.38  ---------------------------------------------------------------------- Vital Signs  Weight (lb): 149                               Height:        5'5"  BMI:         24.79 ---------------------------------------------------------------------- Fetal Evaluation  Num Of Fetuses:          1  Fetal Heart Rate(bpm):   142  Cardiac Activity:        Observed  Presentation:            Cephalic  Amniotic Fluid  AFI FV:      Within normal limits  AFI Sum(cm)     %Tile       Largest Pocket(cm)  16.73           66          5.02  RUQ(cm)       RLQ(cm)       LUQ(cm)        LLQ(cm)  4.24          3.97          5.02            3.5 ---------------------------------------------------------------------- Biophysical Evaluation  Amniotic F.V:   Pocket => 2 cm two         F. Tone:         Observed                  planes  F. Movement:    Observed                   Score:           8/8  F. Breathing:   Observed ---------------------------------------------------------------------- OB History  Gravidity:    1         Term:   0        Prem:   0        SAB:   0  TOP:          0       Ectopic:  0        Living: 0 ---------------------------------------------------------------------- Gestational Age  LMP:           38w 3d        Date:  09/09/17                 EDD:   06/16/18  Best:          38w 3d     Det. By:  LMP  (09/09/17)          EDD:   06/16/18 ---------------------------------------------------------------------- Impression  Biophysical profile 8/8 ---------------------------------------------------------------------- Recommendations  Patient to MAU given NRNST. ----------------------------------------------------------------------               Lin Landsman, MD Electronically Signed Final Report   06/05/2018 04:44 pm ----------------------------------------------------------------------  [place] Mfm Ob Follow Up  Result Date: 05/31/2018 ----------------------------------------------------------------------  OBSTETRICS REPORT                       (Signed Final 05/31/2018 05:38 pm) ---------------------------------------------------------------------- Patient Info  ID #:       [id number]                          D.O.B.:  02-08-1996 (22 yrs)  Name:       Angela Christian               Visit Date: 05/31/2018 03:22 pm ---------------------------------------------------------------------- Performed By  Performed By:     Hurman Horn          Ref. Address:      [facility]                                                              Obstetrics &                                                               Gynecology                                                              [address]                                                              [address]                                                              [place], [place]                                                              [id number]  Attending:        Lin Landsman      Location:          [facility]                    MD  Referred By:      Osborn Coho MD ---------------------------------------------------------------------- Orders   #  Description                          Code         Ordered By   1  [place] MFM OB FOLLOW UP                  60454.09     Noralee Space  ----------------------------------------------------------------------   #  Order #                    Accession #                 Episode #   1  [id number]                  [id number]                  [id number]  ---------------------------------------------------------------------- Indications   Marijuana Abuse (+ UDS 03/11/18)   Encounter for other antenatal screening        Z36.2   follow-up   [redacted] weeks gestation of pregnancy                Z3A.37  ---------------------------------------------------------------------- Vital Signs                                                 Height:        5'5" ---------------------------------------------------------------------- Fetal Evaluation  Num Of Fetuses:          1  Cardiac Activity:        Observed  Presentation:            Cephalic  Placenta:  Anterior  P. Cord Insertion:       Visualized  Amniotic Fluid  AFI FV:      Within normal limits  AFI Sum(cm)     %Tile       Largest Pocket(cm)  13.79           52          4.68  RUQ(cm)       RLQ(cm)       LUQ(cm)        LLQ(cm)  4.26          1.59          4.68           3.26 ---------------------------------------------------------------------- Biometry  BPD:      90.4  mm     G. Age:  36w  5d         41  %    CI:        79.84   %    70 - 86                                                          FL/HC:       20.7  %    20.9 - 22.7  HC:      319.7  mm     G. Age:  36w 0d          5  %    HC/AC:       1.02       0.92 - 1.05  AC:      314.5  mm     G. Age:  35w 2d         10  %    FL/BPD:      73.1  %    71 - 87  FL:       66.1  mm     G. Age:  34w 0d        < 3  %    FL/AC:       21.0  %    20 - 24  HUM:      60.8  mm     G. Age:  35w 2d         28  %  Est. FW:    2623   gm   5 lb 13 oz      20  % ---------------------------------------------------------------------- OB History  Gravidity:    1         Term:   0        Prem:   0        SAB:   0  TOP:          0       Ectopic:  0        Living: 0 ---------------------------------------------------------------------- Gestational Age  LMP:           37w 5d        Date:  09/09/17                 EDD:   06/16/18  U/S Today:     35w 4d  EDD:   07/01/18  Best:          37w 5d     Det. By:  LMP  (09/09/17)          EDD:   06/16/18 ---------------------------------------------------------------------- Anatomy  Cranium:               Appears normal         LVOT:                   Previously seen  Cavum:                 Previously seen        Aortic Arch:            Previously seen  Ventricles:            Previously seen        Ductal Arch:            Previously seen  Choroid Plexus:        Previously seen        Diaphragm:              Appears normal  Cerebellum:            Previously seen        Stomach:                Appears normal, left                                                                        sided  Posterior Fossa:       Previously seen        Abdomen:                Cholelithiasis                                                                        previously seen  Nuchal Fold:           Not applicable (>20    Abdominal Wall:         Previously seen                         wks GA)  Face:                   Orbits and profile     Cord Vessels:           Previously seen                         previously seen  Lips:                  Previously seen        Kidneys:                Appear normal  Palate:  Not well visualized    Bladder:                Appears normal  Thoracic:              Appears normal         Spine:                  Not well visualized  Heart:                 Previously seen        Upper Extremities:      Previously seen  RVOT:                  Previously seen        Lower Extremities:      Previously seen  Other:  Fetus appears to be a female. Technically difficult due to advanced GA          and fetal position. ---------------------------------------------------------------------- Impression  Normal interval growth.  Normal amniotic fluid volume.  Grade III placenta ---------------------------------------------------------------------- Recommendations  Follow up as clincially indicated. ----------------------------------------------------------------------               Lin Landsman, MD Electronically Signed Final Report   05/31/2018 05:38 pm ----------------------------------------------------------------------   MAU Course: Orders Placed This Encounter  Procedures  . OB RESULT CONSOLE Group B Strep  . CBC  . Comprehensive metabolic panel  . Protein / creatinine ratio, urine  . Urinalysis, Routine w reflex microscopic  . RPR  . OB RESULTS CONSOLE GC/Chlamydia  . OB RESULTS CONSOLE RPR  . OB RESULTS CONSOLE HIV antibody  . OB RESULTS CONSOLE Rubella Antibody  . OB RESULTS CONSOLE Hepatitis B surface antigen  . Diet clear liquid Room service appropriate? Yes; Fluid consistency: Thin  . Contraction - monitoring  . External fetal heart monitoring  . Vaginal exam  . Vital signs  . Vitals signs per unit policy  . Notify Physician  . Fetal monitoring per unit policy  . Activity as tolerated  . Cervical Exam  . Measure blood pressure post delivery every 15 min x 1  hour then every 30 min x 1 hour  . Fundal check post delivery every 15 min x 1 hour then every 30 min x 1 hour  . If Rapid HIV test positive or known HIV positive: initiate AZT orders  . May in and out cath x 2 for inability to void  . Insert foley catheter  . Discontinue foley prior to vaginal delivery  . Initiate Carrier Fluid Protocol  . Initiate Oral Care Protocol  . Order Rapid HIV per protocol if no results on chart  . Patient may have epidural placement upon request  . May use local infiltration of 1% lidocaine (plain) to produce a skin wheal prior to insertion of IV catheter  . Notify in-house Anesthesia team of nausea and vomiting greater than 5 hours  . Assess for signs/symptoms of PIH/preeclampsia  . Place lab order for CBC if one has not been drawn in the past 6 hours for all patients with hypertensive disease, pre-eclampsia, eclampsia, thrombocytopenia or previous PLTC<150,000.  . Identify to Anesthesia if patient plans to have postpartum tubal ligation; do not remove epidural without discussion with Anesthesiologist  . Following Epidural Placement, re-bolus or re-dose monitor patient's BP and oxygen saturation every 5 minutes for 30 minutes  . RN to remain at bedside continuously for 30 minutes post epidural placement, post re-bolus /  re-dose  . Call Anesthesiologist if the patient becomes short of breath or complains of heaviness in chest, chest pain, and/or unrelieved pain  . Call Anesthesiologist if the epidural infusion is discontinued at any time  . Full code  . Oxygen therapy  . Type and screen [facility]  . Insert and maintain IV Line  . Admit to Inpatient (patient's expected length of stay will be greater than 2 midnights or inpatient only procedure)   Meds ordered this encounter  Medications  . lactated ringers infusion  . oxytocin (PITOCIN) IV BOLUS FROM BAG  . oxytocin (PITOCIN) IV infusion 40 units in LR 1000 mL - Premix  . lactated ringers  infusion 500-1,000 mL  . acetaminophen (TYLENOL) tablet 650 mg  . oxyCODONE-acetaminophen (PERCOCET/ROXICET) 5-325 MG per tablet 1 tablet  . oxyCODONE-acetaminophen (PERCOCET/ROXICET) 5-325 MG per tablet 2 tablet  . sodium phosphate (FLEET) 7-19 GM/118ML enema 1 enema  . ondansetron (ZOFRAN) injection 4 mg  . sodium citrate-citric acid (ORACIT) solution 30 mL  . lidocaine (PF) (XYLOCAINE) 1 % injection 30 mL  . fentaNYL (SUBLIMAZE) injection 50-100 mcg  . promethazine (PHENERGAN) injection 12.5 mg  . ePHEDrine injection 10 mg  . PHENYLephrine 40 mcg/ml in normal saline Adult IV Push Syringe  . lactated ringers infusion 500 mL  . fentaNYL 2.5 mcg/ml w/bupivacaine 0.1% in NS epidural infusion (WH-ANES)  . diphenhydrAMINE (BENADRYL) injection 12.5 mg  . ePHEDrine injection 10 mg  . PHENYLephrine 40 mcg/ml in normal saline Adult IV Push Syringe  . phenylephrine 0.4-0.9 MG/10ML-% injection    Yetta Barre, Auriel   : cabinet override  . fentaNYL 2.5 mcg/ml w/bupivacaine 1/10% in NS epidural infusion 2.5 mcg/ml    Yetta Barre, Auriel   : cabinet override    Assessment/Plan: Angela Christian is a 22 y.o. female, Angela Christian is a 22 y.o. G1P0000 at [redacted]w[redacted]d who is being admitted for latent labor and pain control. DX with IUGR <3% AC lag. Had elevated Bps in MAU 152/84 & 138/91, pre eclampsia labs neg, PCR 0.16 . Patient denies hx of hypertension. Denies headache, visual changes, or epigastric pain. Endorses n/v which is her baseline d/t cyclic vomiting syndrome. Cervix was 2 cm in office last week. Requesting pain medication for contractions. Denies vaginal bleeding, or LOF. Being followed for IUGR. Normal fetal movement. Pt smokes MJ on daily basis. H/O schizophrenia, bipolar disorder, not on meds.   FWB: Cat 1 Fetal Tracing.   Plan: Admit to Birthing Suite per consult with Dr Normand Sloop Routine [facility] orders Pain med/epidural prn Elevated BP: monitor BP.  H/O MJ use during this pregnancy:  Urine tox pending.  Schizo/Bipolar: Not medicated, may need meds PP. Anticipate labor progression   Linden Mikes NP-C, CNM, MSN 06/13/2018, 2:24 AM

## 2018-06-13 NOTE — MAU Provider Note (Signed)
Chief Complaint:  Contractions   None    HPI: Angela Christian is a 22 y.o. G1P0000 at [redacted]w[redacted]d who presents to maternity admissions for labor evaluation. I was asked to see her d/t elevated BPs. Patient denies hx of hypertension. Denies headache, visual changes, or epigastric pain. Endorses n/v which is her baseline d/t cyclic vomiting syndrome.  Cervix was 2 cm in office last week.  Requesting pain medication for contractions. Denies vaginal bleeding, or LOF. Being followed for IUGS.  Normal fetal movement.   Location: abdomen Quality: contractions Severity: 8/10 in pain scale Duration: several hours Timing: every 3-4 minutes Modifying factors: nothing makes better or worse Associated signs and symptoms: n/v  Past Medical History:  Diagnosis Date  . Anxiety   . Bipolar disease, manic (HCC)   . Cyclical vomiting syndrome   . Depression   . Schizophrenia (HCC)    OB History  Gravida Para Term Preterm AB Living  1 0 0 0 0 0  SAB TAB Ectopic Multiple Live Births  0 0 0 0      # Outcome Date GA Lbr Len/2nd Weight Sex Delivery Anes PTL Lv  1 Current            Past Surgical History:  Procedure Laterality Date  . TONSILLECTOMY     Family History  Problem Relation Age of Onset  . Hypertension Mother    Social History   Tobacco Use  . Smoking status: Former Smoker    Packs/day: 0.00    Types: Cigarettes    Last attempt to quit: 10/08/2017    Years since quitting: 0.6  . Smokeless tobacco: Never Used  Substance Use Topics  . Alcohol use: Not Currently    Comment: varies from few shots of liquor to "whole bottle" daily  . Drug use: Yes    Types: Marijuana    Comment:  TODAY 11-25/19   No Known Allergies Medications Prior to Admission  Medication Sig Dispense Refill Last Dose  . doxylamine, Sleep, (UNISOM) 25 MG tablet Take 25 mg by mouth at bedtime as needed (nausea).   06/05/2018 at Unknown time  . FOLIC ACID PO Take 1 tablet by mouth daily.   06/05/2018 at Unknown  time  . promethazine (PHENERGAN) 25 MG tablet Take 0.5-1 tablets (12.5-25 mg total) by mouth every 6 (six) hours as needed. (Patient taking differently: Take 12.5-25 mg by mouth every 6 (six) hours as needed for nausea or vomiting. ) 30 tablet 0 06/05/2018 at Unknown time    I have reviewed patient's Past Medical Hx, Surgical Hx, Family Hx, Social Hx, medications and allergies.   ROS:  Review of Systems  Constitutional: Negative.   Gastrointestinal: Positive for abdominal pain, nausea and vomiting.  Genitourinary: Negative.     Physical Exam   Patient Vitals for the past 24 hrs:  BP Temp Pulse Resp SpO2  06/12/18 2359 (!) 138/91 - 78 - -  06/12/18 2331 137/85 - 65 - -  06/12/18 2316 129/81 - 73 - -  06/12/18 2301 (!) 143/84 97.8 F (36.6 C) 83 (!) 21 99 %  06/12/18 2244 (!) 152/84 - 83 - -    Constitutional: Well-developed, well-nourished female. Moaning with contractions Cardiovascular: normal rate & rhythm, no murmur Respiratory: normal effort, lung sounds clear throughout MS: Extremities nontender, no edema, normal ROM Neurologic: Alert and oriented x 4.    Dilation: 3.5 Effacement (%): 70 Station: -2 Presentation: Vertex Exam by:: Latricia Heft, RN  NST:  Baseline:  130 bpm, Variability: Good {> 6 bpm), Accelerations: 10x10 and Decelerations: Variable: mild   Labs: Results for orders placed or performed during the hospital encounter of 06/12/18 (from the past 24 hour(s))  CBC     Status: Abnormal   Collection Time: 06/13/18 12:02 AM  Result Value Ref Range   WBC 14.9 (H) 4.0 - 10.5 K/uL   RBC 4.17 3.87 - 5.11 MIL/uL   Hemoglobin 14.3 12.0 - 15.0 g/dL   HCT 40.942.9 81.136.0 - 91.446.0 %   MCV 102.9 (H) 80.0 - 100.0 fL   MCH 34.3 (H) 26.0 - 34.0 pg   MCHC 33.3 30.0 - 36.0 g/dL   RDW 78.213.6 95.611.5 - 21.315.5 %   Platelets 192 150 - 400 K/uL   nRBC 0.0 0.0 - 0.2 %    Imaging:  No results found.  MAU Course: Orders Placed This Encounter  Procedures  . CBC  . Comprehensive  metabolic panel  . Protein / creatinine ratio, urine  . Urinalysis, Routine w reflex microscopic  . RPR  . Contraction - monitoring  . External fetal heart monitoring  . Vaginal exam  . Vital signs  . Type and screen [facility]   Meds ordered this encounter  Medications  . fentaNYL (SUBLIMAZE) injection 50-100 mcg  . promethazine (PHENERGAN) injection 12.5 mg    MDM: New onset elevated BPs. None severe range, pt asymptomatic. PEC labs pending Cervix changed from office visit & FHR with variable decels Dr. Normand Sloopillard notified of pt's admission.   Assessment: 1. Indication for care or intervention in labor or delivery   2. Elevated BP without diagnosis of hypertension   3. [redacted] weeks gestation of pregnancy     Plan: Admit to birthing suites Epidural and/or IV fentanyl ordered for pain GBS negative  Judeth HornLawrence, Katheryne Gorr, NP 06/13/2018 12:35 AM

## 2018-06-13 NOTE — Progress Notes (Signed)
Labor Progress Note  Angela Christian is a 22 y.o. female, Angela SaxonKeira D N Stewartis a 22 y.o.G1P0000 at 8036w4d who is being admitted for latent labor and pain control. DX with IUGR <3% AC lag. Had elevated Bps in MAU 152/84 & 138/91 . Patient denies hx of hypertension. Denies headache, visual changes, or epigastric pain. Endorses n/v which is her baseline d/t cyclic vomiting syndrome. Cervix was 2 cm in office last week. Requesting pain medication for contractions. Denies vaginal bleeding, or LOF. Being followed for IUGR. Normal fetal movement. Pt smokes MJ on daily basis. H/O schizophrenia, bipolar disorder, not on meds.   Subjective: Pt resting in bed quietly, comfortable post epidural placement.  Patient Active Problem List   Diagnosis Date Noted  . Indication for care in labor and delivery, antepartum 06/13/2018  . Cannabis abuse, continuous 04/02/2018  . Gastroesophageal reflux disease 04/02/2018  . [redacted] weeks gestation of pregnancy 04/02/2018  . Vomiting pregnancy 04/02/2018  . MDD (major depressive disorder) 09/10/2015  . Major depressive disorder, recurrent episode (HCC) 09/10/2015  . Polysubstance abuse (HCC) 09/10/2015  . Unspecified mood (affective) disorder (HCC) 09/10/2015  . Secondary amenorrhea 02/27/2015  . Cyclical vomiting syndrome 02/10/2015  .  Objective: BP (!) 97/45 (BP Location: Left Arm)   Pulse 61   Temp 98.1 F (36.7 C) (Oral)   Resp 16   Ht 5\' 5"  (1.651 m)   Wt 66.2 kg   LMP 09/09/2017   SpO2 100%   BMI 24.30 kg/m  No intake/output data recorded. No intake/output data recorded. NST: FHR baseline 120 bpm, Variability: moderate, Accelerations:present, Decelerations:  Absent= Cat 1/Reactive CTX:  irregular, every 4-8 minutes Uterus gravid, soft non tender, moderate to palpate with contractions.  SVE:  Dilation: 6 Effacement (%): 80 Station: -1 Exam by:: Wilder Kurowski CNM  Risk and benefit reviewed with AROM, pt verbalized consent and understanding, pt tolerated  well, clear fluids, moderate amount. Fetus had x1 variable decel post AROM and recovered to baseline with position change. Fetus reactive to scalp stimulation.   Assessment:  Angela CoonsKeira D N Angela Christian, 22 y.o., G1P0000, with an IUP @ 6936w4d, presented for  Patient Active Problem List   Diagnosis Date Noted  . Indication for care in labor and delivery, antepartum 06/13/2018  . Cannabis abuse, continuous 04/02/2018  . Gastroesophageal reflux disease 04/02/2018  . [redacted] weeks gestation of pregnancy 04/02/2018  . Vomiting pregnancy 04/02/2018  . MDD (major depressive disorder) 09/10/2015  . Major depressive disorder, recurrent episode (HCC) 09/10/2015  . Polysubstance abuse (HCC) 09/10/2015  . Unspecified mood (affective) disorder (HCC) 09/10/2015  . Secondary amenorrhea 02/27/2015  . Cyclical vomiting syndrome 02/10/2015   NICHD: Category 1  Membranes:  AROM, clear, mod x 0hrs, no s/s of infection  Pain management:               IV pain management: x phenergan and fentanyl @ 0053 0051  Nitrous:              Epidural placement:  at 11/26 on 0230  GBS negative  Elevated BP: Currently no s/sx BP 97/45   Plan: Continue labor plan Continuous/intermittent monitoring Rest/Frequent position changes to facilitate fetal rotation and descent. Dr Estanislado Pandyivard to reassess with cervical exam at 1030 or earlier if necessary Anticipate Pitocin use of AROM does not augment labor.  Anticipate labor progression and vaginal delivery.   Md Rivard aware of plan and verbalized agreement.   Dale DurhamJade Eero Dini, NP-C, CNM, MSN 06/13/2018. 7:51 AM

## 2018-06-13 NOTE — Anesthesia Pain Management Evaluation Note (Signed)
  CRNA Pain Management Visit Note  Patient: Angela CoonsKeira D N Lovick, 22 y.o., female  "Hello I am a [facility] of the [facility] team [facility]. We have an anesthesia team available at all times to provide care throughout the hospital, including epidural management and anesthesia for C-section. I don't know your plan for the delivery whether it a natural birth, water birth, IV sedation, nitrous supplementation, doula or epidural, but we want to meet your pain goals."   1.Was your pain managed to your expectations on prior hospitalizations?   Yes   2.What is your expectation for pain management during this hospitalization?     Epidural  3.How can we help you reach that goal? Manage epidural as appropriate  Record the patient's initial score and the patient's pain goal.   Pain: 3  Pain Goal: 3 [facility] wants you to be able to say your pain was always managed very well.  Cleda ClarksBrowder, Aspen Lawrance R 06/13/2018

## 2018-06-14 ENCOUNTER — Other Ambulatory Visit: Payer: Self-pay

## 2018-06-14 LAB — CBC
HEMATOCRIT: 40.3 % (ref 36.0–46.0)
Hemoglobin: 13.2 g/dL (ref 12.0–15.0)
MCH: 33.9 pg (ref 26.0–34.0)
MCHC: 32.8 g/dL (ref 30.0–36.0)
MCV: 103.6 fL — AB (ref 80.0–100.0)
NRBC: 0 % (ref 0.0–0.2)
Platelets: 194 10*3/uL (ref 150–400)
RBC: 3.89 MIL/uL (ref 3.87–5.11)
RDW: 13.9 % (ref 11.5–15.5)
WBC: 11.4 10*3/uL — AB (ref 4.0–10.5)

## 2018-06-14 NOTE — Anesthesia Postprocedure Evaluation (Signed)
Anesthesia Post Note  Patient: Breck CoonsKeira D N Trageser  Procedure(s) Performed: AN AD HOC LABOR EPIDURAL     Patient location during evaluation: Mother Baby Anesthesia Type: Epidural Level of consciousness: awake and alert Pain management: pain level controlled Vital Signs Assessment: post-procedure vital signs reviewed and stable Respiratory status: spontaneous breathing, nonlabored ventilation and respiratory function stable Cardiovascular status: stable Postop Assessment: no headache, no backache, epidural receding, able to ambulate, adequate PO intake, no apparent nausea or vomiting and patient able to bend at knees Anesthetic complications: no    Last Vitals:  Vitals:   06/13/18 2159 06/14/18 0300  BP: 127/69 109/68  Pulse: 75 87  Resp: 18 16  Temp: 36.7 C 36.8 C  SpO2:  99%    Last Pain:  Vitals:   06/14/18 0700  TempSrc:   PainSc: Asleep   Pain Goal: Patients Stated Pain Goal: 4 (06/13/18 1811)               Laban EmperorMalinova,Bettejane Leavens Hristova

## 2018-06-14 NOTE — Clinical Social Work Maternal (Signed)
CLINICAL SOCIAL WORK MATERNAL/CHILD NOTE  Patient Details  Name: Peni D N Fake MRN: [id number] Date of Birth: [birth year]/1993  Date:  06/14/2018  Clinical Social Worker Initiating Note:  Arda Keadle Boyd-Gilyard Date/Time: Initiated:  06/14/18/1223     Child's Name:  Jaxyn Wilson   Biological Parents:  Mother, Father   Need for Interpreter:  None   Reason for Referral:  Behavioral Health Concerns, Current Substance Use/Substance Use During Pregnancy    Address:  [address] [place] [address]    Phone number:  [phone] (home)     Additional phone number:   Household Members/Support Persons (HM/SP):   Household Member/Support Person 1   HM/SP Name Relationship DOB or Age  HM/SP -1 Jaryn Wilson 12/15/1993    HM/SP -2        HM/SP -3        HM/SP -4        HM/SP -5        HM/SP -6        HM/SP -7        HM/SP -8          Natural Supports (not living in the home):  Immediate Family, Parent, Extended Family   Professional Supports: None(referral made to HS. )   Employment: Unemployed   Type of Work:     Education:  High school graduate   Homebound arranged:    Financial Resources:  Medicaid   Other Resources:  WIC, Food Stamps    Cultural/Religious Considerations Which May Impact Care:  None reported  Strengths:  Ability to meet basic needs , Home prepared for child , Pediatrician chosen, Understanding of illness   Psychotropic Medications:         Pediatrician:    [place]  Pediatrician List:   [facility]  [place]    [place]    [place]    [place]    [place]      Pediatrician Fax Number:    Risk Factors/Current Problems:  Mental Health Concerns , Substance Use    Cognitive State:  Alert , Able to Concentrate , Insightful , Linear Thinking    Mood/Affect:  Relaxed , Flat , Calm    CSW Assessment: CSW met with MOB in MOB's first floor room/110 to  offer support and complete assessment due to MH hx and marijuana use.  MOB was quiet, but pleasant.  MOB was also receptive to meeting to meeting with CSW. She reports that she and FOB are "together" but since delivery has struggled with communicating with one another.  This is MOB's their first baby, and MOB reported feeling prepared to parent.    MOB states that she and baby are doing well.  She reports that she has everything needed for baby and a good support system.  She is aware of SIDS precautions.    CSW asked about MOB's MH hx and MOB acknowledged a hx of anxiety, depression, bipolar, and schizoaffective disorder. MOB reported being dx around age 24/25 and is not currently taking any medications.  CSW offered MOB resources for outpatient counseling and parenting and MOB was receptive. CSW educated MOB about PPD. CSW informed MOB of possible supports and interventions to decrease PPD.  CSW also encouraged MOB to seek medical attention if needed for increased signs and symptoms for PPD. CSW assessed for safety and MOB denied SI, HI, and DV. MOB presented with insight and awareness and did not demonstrated   any acute signs or symptoms. CSW recommends self-evaluation during the postpartum time period using the New Mom Checklist from Postpartum Progress and encouraged MOB to contact a medical professional if symptoms are noted at any time.    CSW inquired about marijuana use. MOB openly shared the use of marijuana during her pregnancy.  MOB reported MOB's last use was 2 days ago and MOB used to increase MOB's appetite and to decrease MOB's nausea. CSW offered MOB resources for outpatient counseling for SA and MOB declined.  CSW made MOB aware of hospital's drug screen policy and infant's positive UDS for THC.  CSW will make a report to [place] CPS.  CSW explained CPS investigation process and MOB denied having any questions.  CSW will monitor CDS and update CPS as results become available.  CSW  made a referral to [facility].   CSW also made a a report to [place] CPS worker A. Young.  CPS will follow-up with family within 72 hours. There are no barriers to discharge.   CSW Plan/Description:  No Further Intervention Required/No Barriers to Discharge, Sudden Infant Death Syndrome (SIDS) Education, Other Patient/Family Education, Other Information/Referral to Community Resources, Child Protective Service Report    Kierston Plasencia Boyd-Gilyard, MSW, LCSW Clinical Social Work [phone]  Shashwat Cleary D BOYD-GILYARD, LCSW 06/14/2018, 12:26 PM 

## 2018-06-15 MED ORDER — IBUPROFEN 600 MG PO TABS: 600.0000 mg | ORAL_TABLET | Freq: Four times a day (QID) | ORAL | 0 refills | Status: DC

## 2018-06-15 NOTE — Plan of Care (Signed)
Progressing appropriately. Encouraged to call for assistance as needed, and for LATCH assessment.  

## 2018-06-15 NOTE — Lactation Note (Signed)
This note was copied from a baby's chart. Lactation Consultation Note  Patient Name: Angela Charlyne QualeKeira Suber ONGEX'BToday's Date: 06/15/2018   RN will let me know if patient desires to be seen by lactation. Only formula feeding has been noted in chart for the last 36 hrs.    Lurline HareRichey, Harish Bram [facility] 06/15/2018, 9:39 AM

## 2018-06-15 NOTE — Discharge Summary (Signed)
SVD OB Discharge Summary     Patient Name: Angela Christian DOB: 1996-01-27 MRN: [id number]  Date of admission: 06/12/2018 Delivering MD: Silverio Lay  Date of delivery: 06/13/2018 Type of delivery: SVD  Newborn Data: Sex: Baby Boy Circumcision: Out-pt circ Live born female  Birth Weight: 5 lb 10.7 oz (2570 g) APGAR: 9, 9  Newborn Delivery   Birth date/time:  06/13/2018 11:43:00 Delivery type:  Vaginal, Spontaneous     Feeding: breast Infant being discharge to home with mother in stable condition.   Admitting diagnosis: 85 WKS, CTX Intrauterine pregnancy: [redacted]w[redacted]d     Secondary diagnosis:  Active Problems:   Indication for care in labor and delivery, antepartum   Normal postpartum course                                Complications: None                                                              Intrapartum Procedures: spontaneous vaginal delivery Postpartum Procedures: none Complications-Operative and Postpartum: 1st degree perineal laceration Augmentation: AROM and Pitocin   History of Present Illness: [name] is a 21 y.o. female, G1P1001, who presents at [redacted]w[redacted]d weeks gestation. The patient has been followed at  [facility] and Gynecology  Her pregnancy has been complicated by:  Patient Active Problem List   Diagnosis Date Noted  . Normal postpartum course 06/15/2018  . Indication for care in labor and delivery, antepartum 06/13/2018  . Cannabis abuse, continuous 04/02/2018  . Gastroesophageal reflux disease 04/02/2018  . [redacted] weeks gestation of pregnancy 04/02/2018  . Vomiting pregnancy 04/02/2018  . MDD (major depressive disorder) 09/10/2015  . Major depressive disorder, recurrent episode (HCC) 09/10/2015  . Polysubstance abuse (HCC) 09/10/2015  . Unspecified mood (affective) disorder (HCC) 09/10/2015  . Secondary amenorrhea 02/27/2015  . Cyclical vomiting syndrome 02/10/2015   Hospital course:  Onset of Labor With Vaginal  Delivery     22 y.o. yo G1P1001 at [redacted]w[redacted]d was admitted in Latent Labor on 06/12/2018. Patient had an uncomplicated labor course as follows:  Membrane Rupture Time/Date: 6:41 AM ,06/13/2018   Intrapartum Procedures: Episiotomy: None [1]                                         Lacerations:  1st degree [2];Labial [10]  Patient had a delivery of a Viable infant. 06/13/2018  Information for the patient's newborn:  Angela, Christian [[id number]]  Delivery Method: Vag-Spont    Pateint had an uncomplicated postpartum course.  She is ambulating, tolerating a regular diet, passing flatus, and urinating well. Patient is discharged home in stable condition on 06/15/18.  Postpartum Day # 2 : S/P NSVD due to spontaneous latent labor with IUGR termed, +MJ use during pregn with +tox screen, schizo and bipolar disorder not medicated. . Patient up ad lib, denies syncope or dizziness. Reports consuming regular diet without issues and denies N/V. Patient reports 0 bowel movement + passing flatus.  Denies issues with urination and reports bleeding is "lighter."  Patient is breastfeeding and reports going  well.  Desires pills for postpartum contraception.  Pain is being appropriately managed with use of po meds. Denies s/sx of si/hi, mood stable.   Physical exam  Vitals:   06/14/18 0300 06/14/18 1347 06/14/18 2247 06/15/18 0544  BP: 109/68 121/88 (!) 130/91 (!) 126/92  Pulse: 87 72 (!) 114 83  Resp: 16  18 18   Temp: 98.3 F (36.8 C) 98.9 F (37.2 C) 98.4 F (36.9 C) 98.4 F (36.9 C)  TempSrc: Oral Oral Oral Oral  SpO2: 99% 100%    Weight:      Height:       General: alert, cooperative and no distress Lochia: appropriate Uterine Fundus: firm Perineum: intact, approx, no erythema nor hematomas noted DVT Evaluation: No evidence of DVT seen on physical exam. Negative Homan's sign. No cords or calf tenderness. No significant calf/ankle edema.  Labs: Lab Results  Component Value Date   WBC 11.4 (H)  06/14/2018   HGB 13.2 06/14/2018   HCT 40.3 06/14/2018   MCV 103.6 (H) 06/14/2018   PLT 194 06/14/2018   CMP Latest Ref Rng & Units 06/13/2018  Glucose 70 - 99 mg/dL 77  BUN 6 - 20 mg/dL 11  Creatinine 8.46 - 9.62 mg/dL 9.52  Sodium 841 - 324 mmol/L 137  Potassium 3.5 - 5.1 mmol/L 3.4(L)  Chloride 98 - 111 mmol/L 104  CO2 22 - 32 mmol/L 22  Calcium 8.9 - 10.3 mg/dL 4.0(N)  Total Protein 6.5 - 8.1 g/dL 7.0  Total Bilirubin 0.3 - 1.2 mg/dL 0.9  Alkaline Phos 38 - 126 U/L 182(H)  AST 15 - 41 U/L 31  ALT 0 - 44 U/L 30    Date of discharge: 06/15/2018 Discharge Diagnoses: Term Pregnancy-delivered Discharge instruction: per After Visit Summary and "Baby and Me Booklet".  After visit meds:  Allergies as of 06/15/2018   No Known Allergies     Medication List    TAKE these medications   doxylamine (Sleep) 25 MG tablet Commonly known as:  UNISOM Take 25 mg by mouth at bedtime as needed (nausea).   FOLIC ACID PO Take 1 tablet by mouth daily.   ibuprofen 600 MG tablet Commonly known as:  ADVIL,MOTRIN Take 1 tablet (600 mg total) by mouth every 6 (six) hours.   promethazine 25 MG tablet Commonly known as:  PHENERGAN Take 0.5-1 tablets (12.5-25 mg total) by mouth every 6 (six) hours as needed. What changed:  reasons to take this       Activity:           unrestricted and pelvic rest Advance as tolerated. Pelvic rest for 6 weeks.  Diet:                routine Medications: PNV and Ibuprofen Postpartum contraception: Progesterone only pills Condition:  Pt discharge to home with baby in stable Anemia: Iron Mood disorder: please make f/u appointment in 1 week for baby circ and mood check.   Meds: Allergies as of 06/15/2018   No Known Allergies     Medication List    TAKE these medications   doxylamine (Sleep) 25 MG tablet Commonly known as:  UNISOM Take 25 mg by mouth at bedtime as needed (nausea).   FOLIC ACID PO Take 1 tablet by mouth daily.   ibuprofen 600  MG tablet Commonly known as:  ADVIL,MOTRIN Take 1 tablet (600 mg total) by mouth every 6 (six) hours.   promethazine 25 MG tablet Commonly known as:  PHENERGAN Take 0.5-1 tablets (12.5-25  mg total) by mouth every 6 (six) hours as needed. What changed:  reasons to take this       Discharge Follow Up:  Follow-up Information    [facility].   Specialty:  Obstetrics and Gynecology Why:  Please make an appointment for 1 week f/u for mood. Also make an appointment for baby boy circumsicion Contact information: [address] [place] [phone]           TuscaloosaJade Adele Milson, NP-C, CNM 06/15/2018, 12:52 PM  Dale DurhamMONTANA, Kira Hartl, FNP

## 2018-06-17 ENCOUNTER — Inpatient Hospital Stay (HOSPITAL_COMMUNITY)
Admission: RE | Admit: 2018-06-17 | Discharge: 2018-06-17 | Disposition: A | Discharge status: Home / Self Care | Payer: No Typology Code available for payment source | Source: Ambulatory Visit | Attending: Obstetrics and Gynecology | Admitting: Obstetrics and Gynecology

## 2018-06-29 ENCOUNTER — Encounter (HOSPITAL_COMMUNITY): Payer: Self-pay

## 2018-06-29 ENCOUNTER — Inpatient Hospital Stay (HOSPITAL_COMMUNITY)
Admission: AD | Admit: 2018-06-29 | Discharge: 2018-06-29 | Disposition: A | Discharge status: Home / Self Care | Payer: Medicaid Other | Source: Ambulatory Visit | Attending: Obstetrics & Gynecology | Admitting: Obstetrics & Gynecology

## 2018-06-29 DIAGNOSIS — O99325 Drug use complicating the puerperium: Secondary | ICD-10-CM | POA: Diagnosis not present

## 2018-06-29 DIAGNOSIS — O9089 Other complications of the puerperium, not elsewhere classified: Secondary | ICD-10-CM | POA: Diagnosis present

## 2018-06-29 DIAGNOSIS — F1210 Cannabis abuse, uncomplicated: Secondary | ICD-10-CM | POA: Diagnosis not present

## 2018-06-29 LAB — COMPREHENSIVE METABOLIC PANEL
ALT: 19 U/L (ref 0–44)
AST: 18 U/L (ref 15–41)
Albumin: 3.4 g/dL — ABNORMAL LOW (ref 3.5–5.0)
Alkaline Phosphatase: 70 U/L (ref 38–126)
Anion gap: 9 (ref 5–15)
BUN: 9 mg/dL (ref 6–20)
CHLORIDE: 104 mmol/L (ref 98–111)
CO2: 27 mmol/L (ref 22–32)
Calcium: 9.4 mg/dL (ref 8.9–10.3)
Creatinine, Ser: 0.96 mg/dL (ref 0.44–1.00)
GFR calc Af Amer: >60 mL/min (ref >60)
GFR calc non Af Amer: >60 mL/min (ref >60)
Glucose, Bld: 115 mg/dL — ABNORMAL HIGH (ref 70–99)
POTASSIUM: 4.1 mmol/L (ref 3.5–5.1)
SODIUM: 140 mmol/L (ref 135–145)
Total Bilirubin: 0.8 mg/dL (ref 0.3–1.2)
Total Protein: 6.5 g/dL (ref 6.5–8.1)

## 2018-06-29 LAB — CBC
HCT: 44.5 % (ref 36.0–46.0)
Hemoglobin: 14.8 g/dL (ref 12.0–15.0)
MCH: 34.3 pg — ABNORMAL HIGH (ref 26.0–34.0)
MCHC: 33.3 g/dL (ref 30.0–36.0)
MCV: 103.2 fL — AB (ref 80.0–100.0)
Platelets: 310 10*3/uL (ref 150–400)
RBC: 4.31 MIL/uL (ref 3.87–5.11)
RDW: 12.4 % (ref 11.5–15.5)
WBC: 7.7 10*3/uL (ref 4.0–10.5)
nRBC: 0 % (ref 0.0–0.2)

## 2018-06-29 LAB — URINALYSIS, ROUTINE W REFLEX MICROSCOPIC
BILIRUBIN URINE: NEGATIVE
Glucose, UA: NEGATIVE mg/dL
Ketones, ur: 5 mg/dL — AB
Nitrite: NEGATIVE
Protein, ur: 100 mg/dL — AB
Specific Gravity, Urine: 1.025 (ref 1.005–1.030)
WBC, UA: >50 WBC/hpf — ABNORMAL HIGH (ref 0–5)
pH: 6 (ref 5.0–8.0)

## 2018-06-29 MED ORDER — PROMETHAZINE HCL 25 MG PO TABS: 12.5000 mg | ORAL_TABLET | Freq: Four times a day (QID) | ORAL | 0 refills | Status: DC | PRN

## 2018-06-29 MED ORDER — PROMETHAZINE HCL 25 MG PO TABS
12.5000 mg | ORAL_TABLET | Freq: Once | ORAL | Status: AC
  Administered 2018-06-29: 12.5 mg via ORAL
  Filled 2018-06-29: qty 1

## 2018-06-29 NOTE — MAU Provider Note (Addendum)
Patioent Angela Christian is a 22 y.o.  G1P1001 at 2 weeks postpartum from an NSVD here with complaints of nausea and vomiting. She endorses "a little bit" of diarrhea. She denies blurry vision, floating spots. She still has some light lochia, and "a little headache". She denies history of pre-e, diabetes in her pregnancy; she says "they were concerned about his weight because he was a little small". Baby is fine now.   She called [facility] to try to get a prescription for her nausea but "they didn't answer". She did not call them otherwise.  History     CSN: [id number]  Arrival date and time: 06/29/18 1837   None     Chief Complaint  Patient presents with  . Emesis  . Nausea   Emesis   The current episode started in the past 7 days. The problem occurs 2 to 4 times per day. The problem has been gradually improving. The emesis has an appearance of stomach contents and bile. There has been no fever. Associated symptoms include diarrhea. Pertinent negatives include no chest pain, chills or coughing.  She ate some JerseyFrench Fries and apple juice at 7:15 pm. She didn't eat anything today. Yesterday she ate chicken tenders, rice and beans; she threw that up this morning.   OB History    Gravida  1   Para  1   Term  1   Preterm  0   AB  0   Living  1     SAB  0   TAB  0   Ectopic  0   Multiple  0   Live Births  1           Past Medical History:  Diagnosis Date  . Anxiety   . Bipolar disease, manic (HCC)   . Cyclical vomiting syndrome   . Depression   . Schizophrenia [facility](HCC)     Past Surgical History:  Procedure Laterality Date  . TONSILLECTOMY      Family History  Problem Relation Age of Onset  . Hypertension Mother     Social History   Tobacco Use  . Smoking status: Former Smoker    Packs/day: 0.00    Types: Cigarettes    Last attempt to quit: 10/08/2017    Years since quitting: 0.7  . Smokeless tobacco: Never Used  Substance Use Topics  . Alcohol use:  Not Currently    Comment: varies from few shots of liquor to "whole bottle" daily  . Drug use: Yes    Types: Marijuana    Comment:  TODAY 11-25/19    Allergies: No Known Allergies  Medications Prior to Admission  Medication Sig Dispense Refill Last Dose  . acetaminophen (TYLENOL) 500 MG tablet Take 500 mg by mouth every 6 (six) hours as needed for mild pain.   Past Week at Unknown time  . doxylamine, Sleep, (UNISOM) 25 MG tablet Take 25 mg by mouth at bedtime as needed (nausea).   Unknown at Unknown time  . FOLIC ACID PO Take 1 tablet by mouth daily.   Unknown at Unknown time  . ibuprofen (ADVIL,MOTRIN) 600 MG tablet Take 1 tablet (600 mg total) by mouth every 6 (six) hours. 30 tablet 0   . promethazine (PHENERGAN) 25 MG tablet Take 0.5-1 tablets (12.5-25 mg total) by mouth every 6 (six) hours as needed. (Patient taking differently: Take 12.5-25 mg by mouth every 6 (six) hours as needed for nausea or vomiting. ) 30 tablet 0 Unknown  at Unknown time    Review of Systems  Constitutional: Negative for chills.  Respiratory: Negative for cough.   Cardiovascular: Negative for chest pain.  Gastrointestinal: Positive for diarrhea and vomiting.  Genitourinary: Positive for vaginal discharge.  Neurological: Negative.    Physical Exam   Blood pressure 106/78, pulse (!) 107, temperature 98.9 F (37.2 C), temperature source Oral, resp. rate 17, height 5\' 5"  (1.651 m), weight 56.7 kg, SpO2 97 %, unknown if currently breastfeeding.  Physical Exam  Constitutional: She is oriented to person, place, and time. She appears well-developed and well-nourished.  HENT:  Head: Normocephalic.  GI: Soft.  Musculoskeletal: Normal range of motion.  Neurological: She is alert and oriented to person, place, and time.  Skin: Skin is warm and dry.    MAU Course  Procedures  MDM -patient admits to smoking marijuana daily; she last smoked today. She admits to smoking weed in the past and having cyclic  vomiting with that.  -she desires phenergan now and also a prescription to take home.  -will do CBC and CMP to check electrolytes: results are normal.  -UA shows mild dehydration and leuks, will send for urine culture.  Explained to patient that her vomiting does not sound like its viral as patient has not had diarrhea and gastroenteritis would have passed within 2-3 days.   I discussed in detail that marijuana daily can cause cyclic vomiting and pain and that she should try to wean herself off of it. Patient verbalzied understanding.   Assessment and Plan   1. Cannabis abuse, continuous    2. Patient stable for discharge with RX for phenergan and urine culture pending.   3. Reviewed warning signs and when to return to MAU (if she develops bleeding, foul smelling discharge) or if she can't keep fluids down, if she develops nausea/vomiting that is intractable.   4. Patient verbalized understanding; stable for discharge.   Charlesetta Garibaldi Ary Rudnick 06/29/2018, 8:28 PM

## 2018-06-29 NOTE — Discharge Instructions (Signed)
Cannabis Use Disorder Cannabis use disorder is when using marijuana disrupts a person's daily life or causes health problems. This condition can be dangerous. The health problems this condition can cause include:  Long-lasting problems with thinking and learning. These can be permanent in young people.  Severe anxiety.  Paranoia.  Hallucinations.  Dangerously high blood pressure and heart rate.  Schizophrenia.  Breathing problems.  Problems with child development during and after pregnancy.  People with this condition are also more likely to use other drugs. What are the causes? This condition is caused by using marijuana too much over time. It is not caused by using it only once in a while. Many people with this condition use marijuana because it gives them a feeling of extreme pleasure or relaxation. What increases the risk? This condition is more likely to develop in:  Men.  People with a family history of cannabis use disorder.  People with mental health issues such as depression or post-traumatic stress disorder.  What are the signs or symptoms? Symptoms of this condition include:  Using greater amounts of marijuana than you want to, or using marijuana for longer than you want to.  Craving marijuana.  Spending a lot of time getting marijuana and using it or recovering from its effects.  Having problems at work, at school, at home, or in relationships because of marijuana use.  Giving up or cutting down on important life activities because of marijuana use.  Using marijuana at times when it is dangerous, such as while you are driving a car.  Needing more and more marijuana to get the same effect you want from the marijuana (building up a tolerance).  Physical problems, such as: ? A long-lasting cough. ? Bronchitis. ? Emphysema. ? Throat and lung cancer.  Mental problems, such as: ? Psychosis. ? Anxiety. ? Trouble sleeping.  Having symptoms of withdrawal  when you stop using marijuana. Symptom of withdrawal include: ? Irritability or anger. ? Anxiety or restlessness. ? Trouble sleeping. ? Loss of appetite or weight loss. ? Aches and pains. ? Shakiness, sweating, or chills.  How is this diagnosed? This condition is diagnosed with an assessment. During the assessment, your health care provider will ask about your marijuana use and about how it affects your life. You will be diagnosed with the condition if you have had at least two symptoms of this condition within a 12-month period. How severe the condition is depends on how many symptoms you have.  If you have two to three symptoms, your condition is mild.  If you have four to five symptoms, your condition is moderate.  If you have six or more symptoms, your condition is severe.  Your health care provider may perform a physical exam or do lab tests to see if you have physical problems resulting from marijuana use. Your health care provider may also screen for drug use and refer you to a mental health professional for evaluation. How is this treated? Treatment for this condition is usually provided by mental health professionals with training in substance use disorders. Your treatment may involve:  Counseling. This treatment is also called talk therapy. It is provided by substance use treatment counselors. A counselor can address the reasons you use marijuana and suggest ways to keep you from using it again. The goals of talk therapy are to: ? Find healthy activities to replace using marijuana. ? Identify and avoid the things that trigger your marijuana use. ? Help you learn how to handle   cravings.  Support groups. Support groups are led by people who have quit using marijuana. They provide emotional support, advice, and guidance.  Medicine. Medicine is used to treat mental health issues that trigger marijuana use or that result from it.  Follow these instructions at home:  Take  over-the-counter and prescription medicines only as told by your health care provider.  Check with your health care provider before starting any new medicines.  Keep all follow-up visits as told by your health care provider. This is important.  Work with your counselor or group to develop tools to keep you from using marijuana again (relapsing).  Make healthy lifestyle choices, such as: ? Eating a healthy diet. ? Getting enough exercise. ? Improving your stress-management skills.  Learn daily living skills and work skills. Where to find more information:  National Institute on Drug Abuse: www.drugabuse.gov  Substance Abuse and Mental Health Services Administration: www.samhsa.gov Contact a health care provider if:  You are not able to take your medicines as told.  Your symptoms get worse. Get help right away if:  You have serious thoughts about hurting yourself or others. If you ever feel like you may hurt yourself or others, or have thoughts about taking your own life, get help right away. You can go to your nearest emergency department or call:  Your local emergency services (911 in the [place]  A suicide crisis helpline, such as the National Suicide Prevention Lifeline at 1-800-273-8255. This is open 24 hours a day.  This information is not intended to replace advice given to you by your health care provider. Make sure you discuss any questions you have with your health care provider. Document Released: 07/02/2000 Document Revised: 04/02/2016 Document Reviewed: 04/02/2016 Elsevier Interactive Patient Education  2018 Elsevier Inc.  

## 2018-07-01 LAB — URINE CULTURE: Culture: >=100000 — AB

## 2018-09-14 NOTE — Telephone Encounter (Signed)
A;ready done

## 2018-11-15 ENCOUNTER — Ambulatory Visit (HOSPITAL_COMMUNITY)
Admission: EM | Admit: 2018-11-15 | Discharge: 2018-11-15 | Disposition: A | Discharge status: Home / Self Care | Payer: Medicaid Other | Attending: Family Medicine | Admitting: Family Medicine

## 2018-11-15 ENCOUNTER — Encounter (HOSPITAL_COMMUNITY): Payer: Self-pay

## 2018-11-15 ENCOUNTER — Other Ambulatory Visit: Payer: Self-pay

## 2018-11-15 DIAGNOSIS — A084 Viral intestinal infection, unspecified: Secondary | ICD-10-CM

## 2018-11-15 DIAGNOSIS — Z3202 Encounter for pregnancy test, result negative: Secondary | ICD-10-CM

## 2018-11-15 DIAGNOSIS — R110 Nausea: Secondary | ICD-10-CM

## 2018-11-15 LAB — POCT PREGNANCY, URINE: Preg Test, Ur: NEGATIVE

## 2018-11-15 MED ORDER — ONDANSETRON HCL 4 MG/2ML IJ SOLN
4.0000 mg | Freq: Once | INTRAMUSCULAR | Status: AC
  Administered 2018-11-15: 4 mg via INTRAMUSCULAR

## 2018-11-15 MED ORDER — METOCLOPRAMIDE HCL 10 MG PO TABS: 10.0000 mg | ORAL_TABLET | Freq: Three times a day (TID) | ORAL | 0 refills | Status: DC

## 2018-11-15 MED ORDER — ONDANSETRON HCL 4 MG/2ML IJ SOLN
INTRAMUSCULAR | Status: AC
  Filled 2018-11-15: qty 2

## 2018-11-15 NOTE — ED Triage Notes (Signed)
Patient presents to Urgent Care with complaints of nausea for several days. Patient states she has had an irregular period, has not been able to keep food down for 2 days and has been able to keep minimal liquid down, no emesis today.

## 2018-11-15 NOTE — ED Provider Notes (Addendum)
[facility]   [id number] 11/15/18 Arrival Time: 1101  CC: Nausea  SUBJECTIVE:  Angela Christian is a 23 y.o. female hx significant for anxiety, bipolar disease, cyclical vomiting syndrome, depression, and schizophrenia, who presents with complaint of nausea and 1 episode of vomiting that began 4-5 days ago.  Symptoms began after eating seafood.  Complains of mild associated abdominal cramping. Has had nausea with eating and drinking, but able to keep sips of water down.  Reports similar symptoms in the past that resolved with nausea medication.   Denies fever, chills, appetite changes, chest pain, SOB, diarrhea, constipation, hematochezia, melena, dysuria, difficulty urinating, increased frequency or urgency, flank pain, loss of bowel or bladder function.  Patient's last menstrual period was 09/19/2018 (approximate).  ROS: As per HPI.  Past Medical History:  Diagnosis Date  . Anxiety   . Bipolar disease, manic (HCC)   . Cyclical vomiting syndrome   . Depression   . Schizophrenia [facility](HCC)    Past Surgical History:  Procedure Laterality Date  . TONSILLECTOMY     No Known Allergies No current facility-administered medications on file prior to encounter.    Current Outpatient Medications on File Prior to Encounter  Medication Sig Dispense Refill  . acetaminophen (TYLENOL) 500 MG tablet Take 500 mg by mouth every 6 (six) hours as needed for mild pain.    Marland Kitchen. doxylamine, Sleep, (UNISOM) 25 MG tablet Take 25 mg by mouth at bedtime as needed (nausea).    . FOLIC ACID PO Take 1 tablet by mouth daily.    Marland Kitchen. ibuprofen (ADVIL,MOTRIN) 600 MG tablet Take 1 tablet (600 mg total) by mouth every 6 (six) hours. 30 tablet 0   Social History   Socioeconomic History  . Marital status: Single    Spouse name: Not on file  . Number of children: Not on file  . Years of education: Not on file  . Highest education level: Not on file  Occupational History  . Not on file  Social Needs  .  Financial resource strain: Not on file  . Food insecurity:    Worry: Not on file    Inability: Not on file  . Transportation needs:    Medical: Not on file    Non-medical: Not on file  Tobacco Use  . Smoking status: Former Smoker    Packs/day: 0.00    Types: Cigarettes    Last attempt to quit: 10/08/2017    Years since quitting: 1.1  . Smokeless tobacco: Never Used  Substance and Sexual Activity  . Alcohol use: Not Currently    Comment: varies from few shots of liquor to "whole bottle" daily  . Drug use: Yes    Types: Marijuana    Comment:  TODAY 11-25/19  . Sexual activity: Yes    Birth control/protection: None  Lifestyle  . Physical activity:    Days per week: Not on file    Minutes per session: Not on file  . Stress: Not on file  Relationships  . Social connections:    Talks on phone: Not on file    Gets together: Not on file    Attends religious service: Not on file    Active member of club or organization: Not on file    Attends meetings of clubs or organizations: Not on file    Relationship status: Not on file  . Intimate partner violence:    Fear of current or ex partner: Not on file    Emotionally abused:  Not on file    Physically abused: Not on file    Forced sexual activity: Not on file  Other Topics Concern  . Not on file  Social History Narrative  . Not on file   Family History  Problem Relation Age of Onset  . Hypertension Mother      OBJECTIVE:  Vitals:   11/15/18 1144  BP: 128/68  Pulse: 72  Resp: 18  Temp: 98.2 F (36.8 C)  TempSrc: Oral  SpO2: 100%    General appearance: Alert; NAD HEENT: NCAT.  Oropharynx clear.  Lungs: clear to auscultation bilaterally without adventitious breath sounds Heart: regular rate and rhythm.  Radial pulses 2+ symmetrical bilaterally Abdomen: soft, non-distended; normal active bowel sounds; mild diffuse abdominal discomfort; nontender at McBurney's point; no guarding Extremities: no edema; symmetrical with  no gross deformities Skin: warm and dry Neurologic: normal gait Psychological: alert and cooperative; irritated mood and flat affect  LABS: Results for orders placed or performed during the hospital encounter of 11/15/18 (from the past 24 hour(s))  Pregnancy, urine POC     Status: None   Collection Time: 11/15/18 11:53 AM  Result Value Ref Range   Preg Test, Ur NEGATIVE NEGATIVE    ASSESSMENT & PLAN:  1. Viral gastroenteritis   2. Nausea     Meds ordered this encounter  Medications  . ondansetron (ZOFRAN) injection 4 mg  . metoCLOPramide (REGLAN) 10 MG tablet    Sig: Take 1 tablet (10 mg total) by mouth 3 (three) times daily before meals.    Dispense:  15 tablet    Refill:  0    Order Specific Question:   Supervising Provider    Answer:   Eustace Moore [[id number]]     Get rest and drink fluids You may supplement with OTC pedialyte or oral rehydration solution Reglan prescribed.  Take as directed.    DIET Instructions:  30 minutes after taking nausea medicine, begin with sips of clear liquids. If able to hold down 2 - 4 ounces for 30 minutes, begin drinking more. Increase your fluid intake to replace losses. Clear liquids only for 24 hours (water, tea, sport drinks, clear flat ginger ale or cola and juices, broth, jello, popsicles, ect). Advance to bland foods, applesauce, rice, baked or boiled chicken, ect. Avoid milk, greasy foods and anything that doesn't agree with you.  If you experience new or worsening symptoms return or go to ER such as fever, chills, nausea, vomiting, diarrhea, bloody or dark tarry stools, constipation, urinary symptoms, worsening abdominal discomfort, symptoms that do not improve with medications, inability to keep fluids down, etc...  Reviewed expectations re: course of current medical issues. Questions answered. Outlined signs and symptoms indicating need for more acute intervention. Patient verbalized understanding. After Visit Summary  given.    Rennis Harding, PA-C 11/15/18 1240

## 2018-11-15 NOTE — Discharge Instructions (Signed)
Get rest and drink fluids You may supplement with OTC pedialyte or oral rehydration solution Reglan prescribed.  Take as directed.    DIET Instructions:  30 minutes after taking nausea medicine, begin with sips of clear liquids. If able to hold down 2 - 4 ounces for 30 minutes, begin drinking more. Increase your fluid intake to replace losses. Clear liquids only for 24 hours (water, tea, sport drinks, clear flat ginger ale or cola and juices, broth, jello, popsicles, ect). Advance to bland foods, applesauce, rice, baked or boiled chicken, ect. Avoid milk, greasy foods and anything that doesnt agree with you.  If you experience new or worsening symptoms return or go to ER such as fever, chills, nausea, vomiting, diarrhea, bloody or dark tarry stools, constipation, urinary symptoms, worsening abdominal discomfort, symptoms that do not improve with medications, inability to keep fluids down, etc..Marland Kitchen

## 2019-01-06 IMAGING — US US PELVIS COMPLETE
1 series · 14 of 25 positions shown · non-contrast
Comparison: CT 07/08/2016

CLINICAL DATA: Vomiting

EXAM:
TRANSABDOMINAL ULTRASOUND OF PELVIS
TECHNIQUE: Transabdominal ultrasound examination of the pelvis was performed
including evaluation of the uterus, ovaries, adnexal regions, and
pelvic cul-de-sac.

[Series 1: us pelvis complete · 0.15mm/px · 14 of 53 slices shown]
[im 1/53]
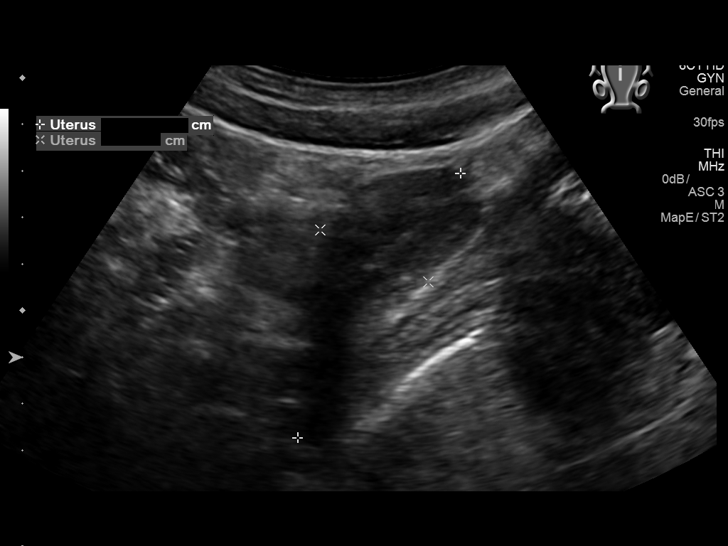
[im 5/53]
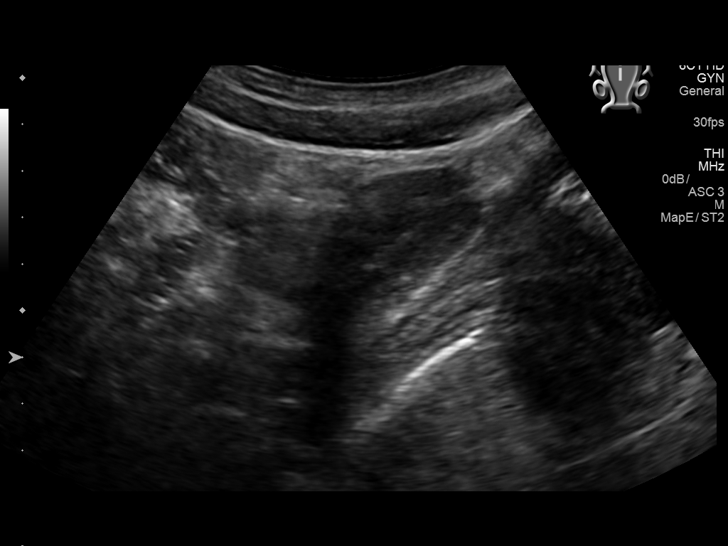
[im 9/53]
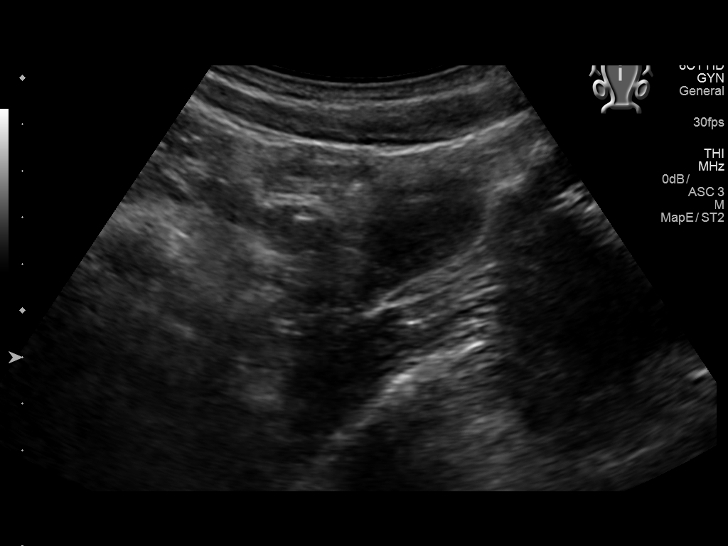
[im 14/53]
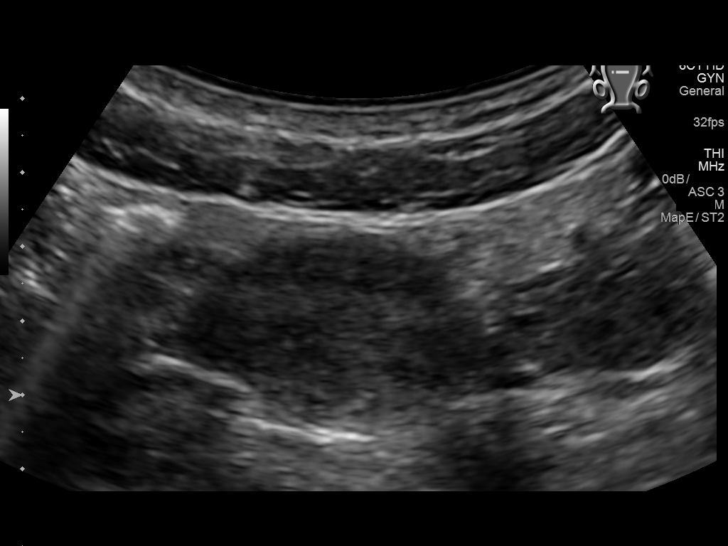
[im 18/53]
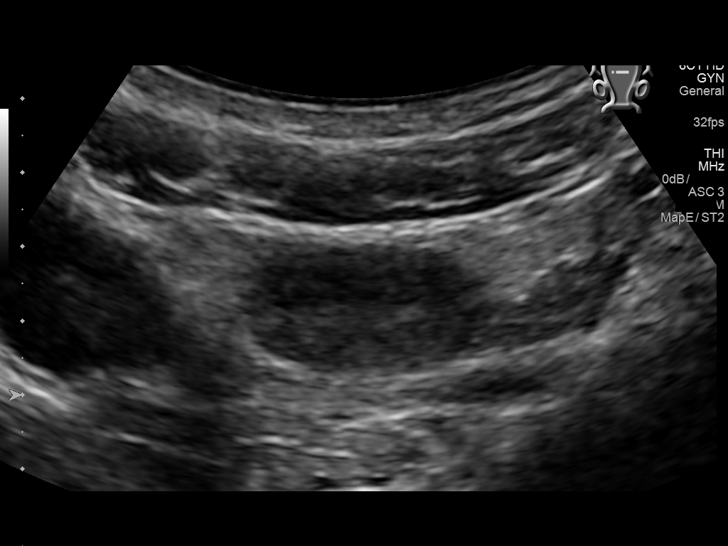
[im 20/53]
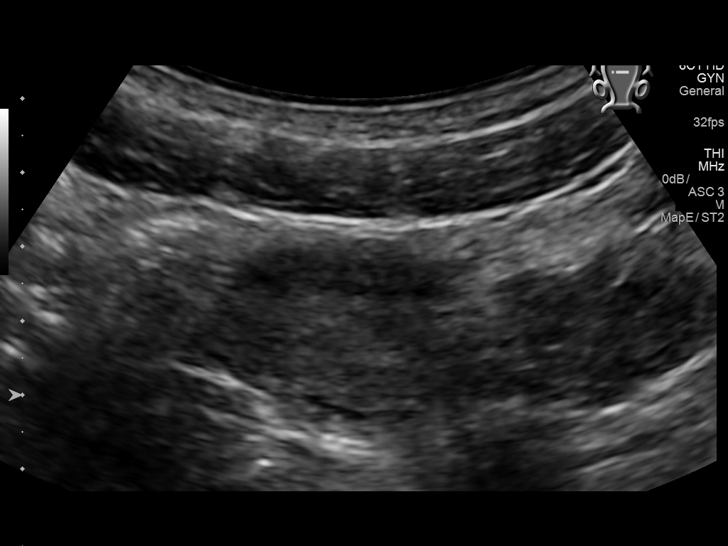
[im 24/53]
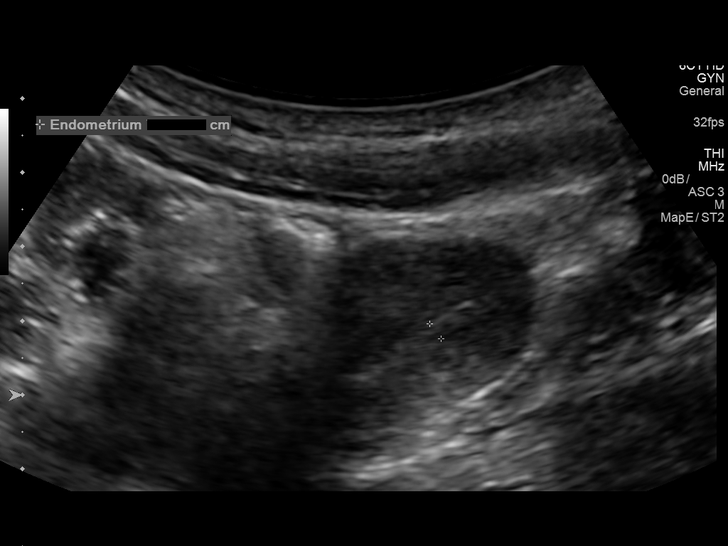
[im 29/53]
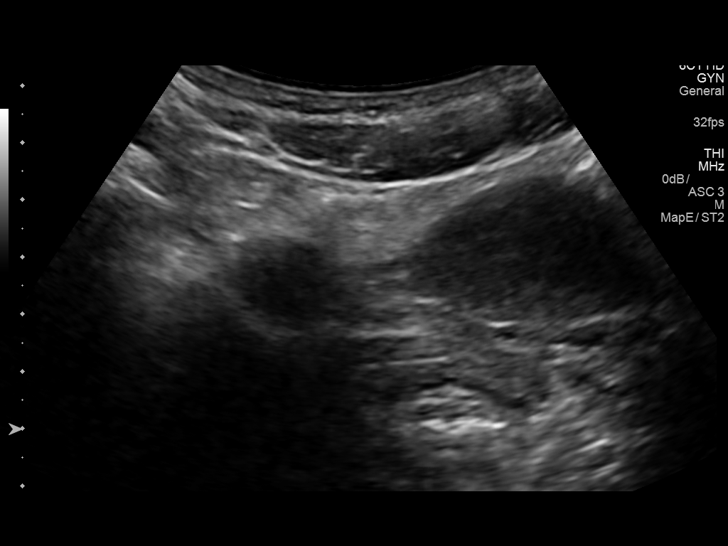
[im 33/53]
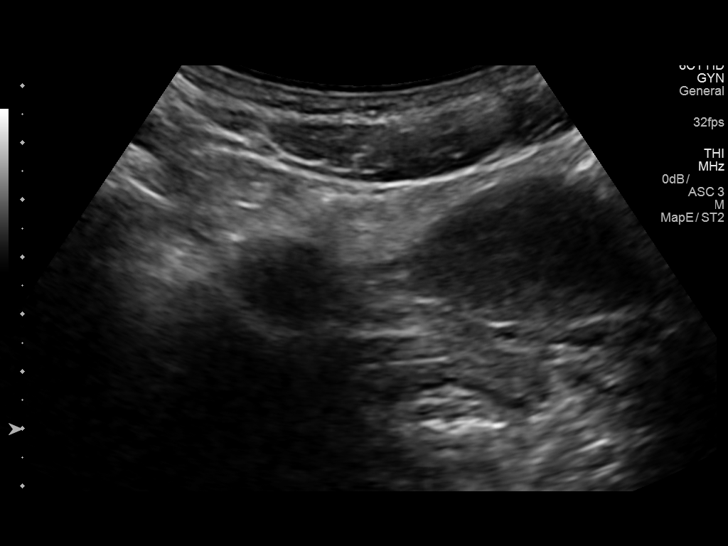
[im 35/53]
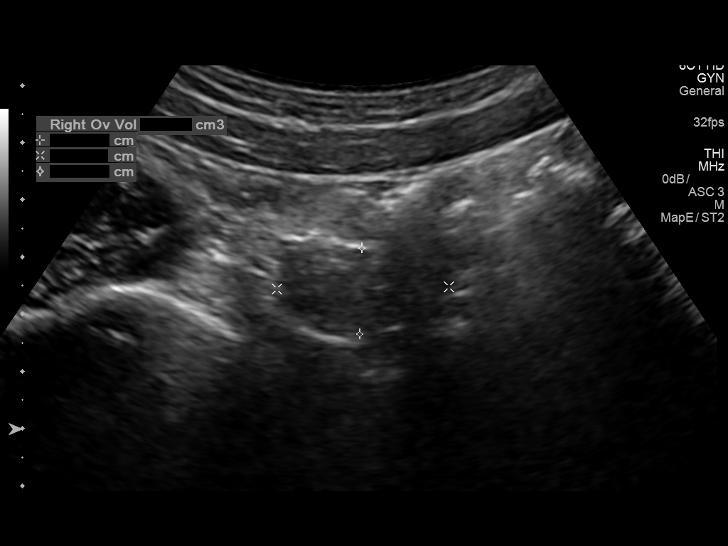
[im 40/53]
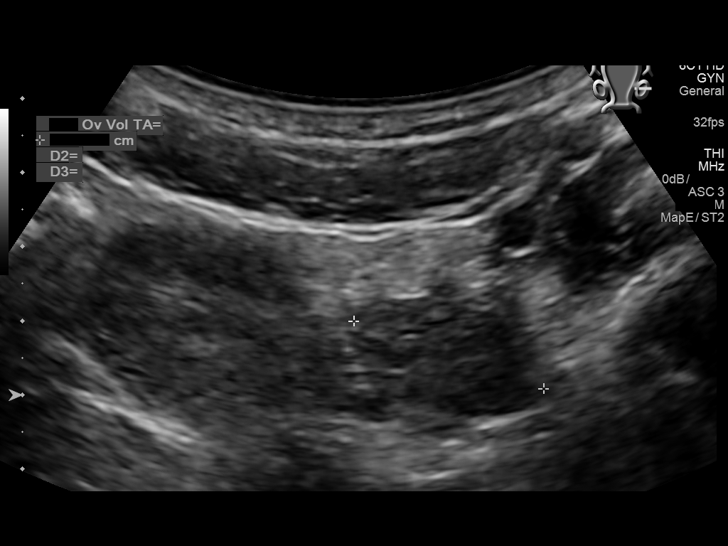
[im 44/53]
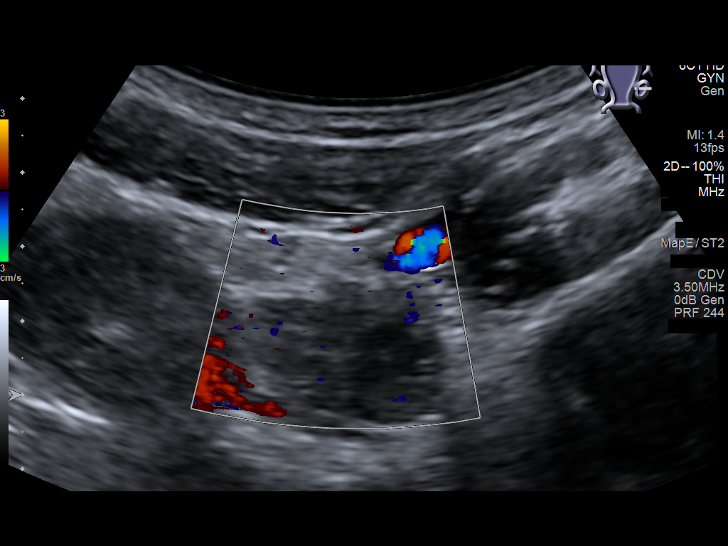
[im 48/53]
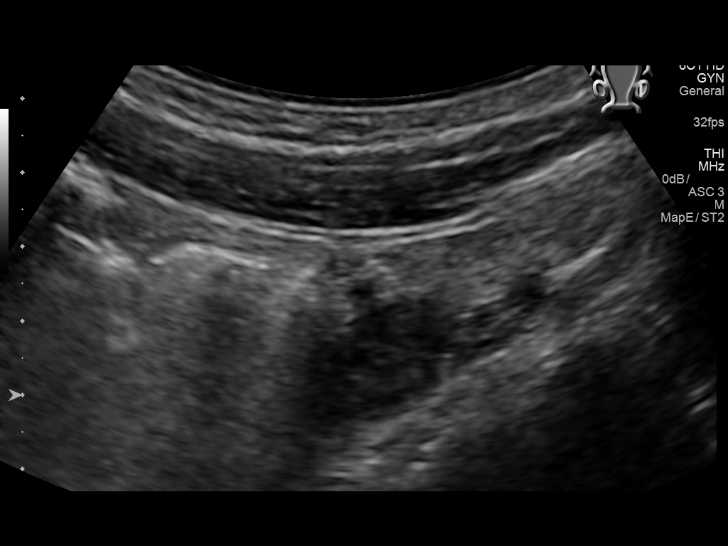
[im 53/53]
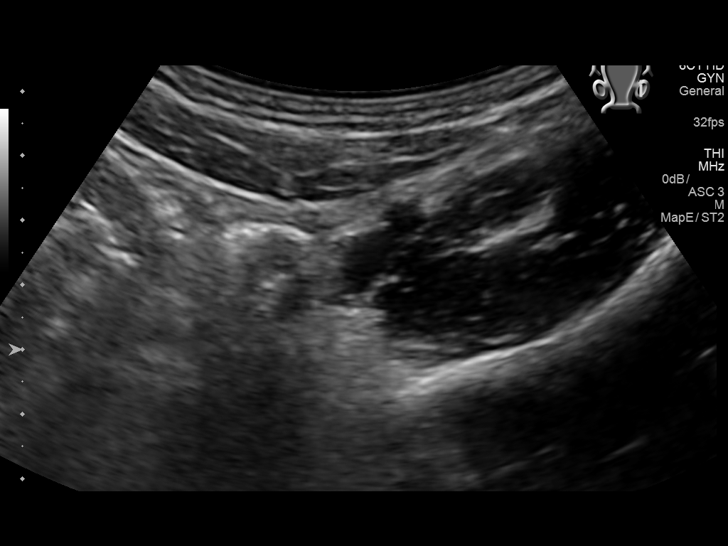

[14 of 25 positions shown; findings below may reference images not displayed]

FINDINGS: Uterus

Measurements: 6.7 x 2.6 x 4.0 cm. No fibroids or other mass
visualized.

Endometrium

Thickness: Normal thickness, 3 mm.  No focal abnormality visualized.

Right ovary

Measurements: 3.0 x 1.5 x 1.9 cm. Normal appearance/no adnexal mass.

Left ovary

Measurements: 2.7 x 1.5 x 2.3 cm. Normal appearance/no adnexal mass.

Other findings:  No abnormal free fluid.
IMPRESSION: Normal study.

## 2019-01-16 ENCOUNTER — Other Ambulatory Visit: Payer: Self-pay

## 2019-01-16 ENCOUNTER — Encounter: Payer: Self-pay | Admitting: Family Medicine

## 2019-01-16 ENCOUNTER — Ambulatory Visit (HOSPITAL_COMMUNITY)
Admission: EM | Admit: 2019-01-16 | Discharge: 2019-01-16 | Discharge status: Against Medical Advice | Payer: Medicaid Other

## 2019-01-16 NOTE — ED Notes (Signed)
Pt called x 3 by patient access. No answer

## 2019-01-24 ENCOUNTER — Telehealth: Payer: Self-pay | Admitting: Obstetrics & Gynecology

## 2019-01-24 NOTE — Telephone Encounter (Signed)
Called the patient to inform of upcoming appointment. Left a detailed voicemail of the location, appointment time and date. Also informed the patient if they’ve been in close contact with someone or diagnosed with Covid19 in the previous 14-day period please call to reschedule. If the patient has also experience flu-like symptoms such as sore throat, fever, and/or shortness of breath. Please call our office to reschedule. In addition, please be sure to wear a face mask to the visit and sanitize hands upon entering the office. Also, no children or visitors or allowed due to Covid19 restrictions. If you have any questions or concerns, please contact our office. °

## 2019-01-25 ENCOUNTER — Ambulatory Visit: Payer: Medicaid Other | Admitting: Family Medicine

## 2019-01-25 ENCOUNTER — Encounter: Payer: Self-pay | Admitting: Family Medicine

## 2019-01-25 NOTE — Progress Notes (Signed)
Patient did not keep appointment today. She may call to reschedule.  

## 2019-03-07 ENCOUNTER — Encounter (HOSPITAL_COMMUNITY): Payer: Self-pay | Admitting: Emergency Medicine

## 2019-03-07 ENCOUNTER — Emergency Department (HOSPITAL_COMMUNITY)
Admission: EM | Admit: 2019-03-07 | Discharge: 2019-03-07 | Disposition: A | Discharge status: Home / Self Care | Payer: Medicaid Other | Attending: Pediatric Emergency Medicine | Admitting: Pediatric Emergency Medicine

## 2019-03-07 ENCOUNTER — Other Ambulatory Visit: Payer: Self-pay

## 2019-03-07 DIAGNOSIS — R109 Unspecified abdominal pain: Secondary | ICD-10-CM | POA: Insufficient documentation

## 2019-03-07 DIAGNOSIS — Z20828 Contact with and (suspected) exposure to other viral communicable diseases: Secondary | ICD-10-CM | POA: Insufficient documentation

## 2019-03-07 DIAGNOSIS — R1115 Cyclical vomiting syndrome unrelated to migraine: Secondary | ICD-10-CM

## 2019-03-07 DIAGNOSIS — R197 Diarrhea, unspecified: Secondary | ICD-10-CM | POA: Insufficient documentation

## 2019-03-07 DIAGNOSIS — Z87891 Personal history of nicotine dependence: Secondary | ICD-10-CM | POA: Insufficient documentation

## 2019-03-07 DIAGNOSIS — Z79899 Other long term (current) drug therapy: Secondary | ICD-10-CM | POA: Insufficient documentation

## 2019-03-07 LAB — POC URINE PREG, ED: Preg Test, Ur: NEGATIVE

## 2019-03-07 LAB — COMPREHENSIVE METABOLIC PANEL
ALT: 27 U/L (ref 0–44)
AST: 31 U/L (ref 15–41)
Albumin: 4.5 g/dL (ref 3.5–5.0)
Alkaline Phosphatase: 59 U/L (ref 38–126)
Anion gap: 13 (ref 5–15)
BUN: <5 mg/dL — ABNORMAL LOW (ref 6–20)
CO2: 27 mmol/L (ref 22–32)
Calcium: 10.1 mg/dL (ref 8.9–10.3)
Chloride: 101 mmol/L (ref 98–111)
Creatinine, Ser: 0.84 mg/dL (ref 0.44–1.00)
GFR calc Af Amer: >60 mL/min (ref >60)
GFR calc non Af Amer: >60 mL/min (ref >60)
Glucose, Bld: 87 mg/dL (ref 70–99)
Potassium: 3.5 mmol/L (ref 3.5–5.1)
Sodium: 141 mmol/L (ref 135–145)
Total Bilirubin: 1.2 mg/dL (ref 0.3–1.2)
Total Protein: 7.5 g/dL (ref 6.5–8.1)

## 2019-03-07 LAB — SARS CORONAVIRUS 2 (TAT 6-24 HRS): SARS Coronavirus 2: NEGATIVE

## 2019-03-07 LAB — LIPASE, BLOOD: Lipase: 28 U/L (ref 11–51)

## 2019-03-07 MED ORDER — SODIUM CHLORIDE 0.9 % IV BOLUS
1000.0000 mL | Freq: Once | INTRAVENOUS | Status: AC
  Administered 2019-03-07: 1000 mL via INTRAVENOUS

## 2019-03-07 MED ORDER — PROMETHAZINE HCL 25 MG/ML IJ SOLN
25.0000 mg | Freq: Once | INTRAMUSCULAR | Status: AC
  Administered 2019-03-07: 25 mg via INTRAVENOUS
  Filled 2019-03-07: qty 1

## 2019-03-07 MED ORDER — ONDANSETRON 4 MG PO TBDP
4.0000 mg | ORAL_TABLET | Freq: Once | ORAL | Status: AC
  Administered 2019-03-07: 4 mg via ORAL
  Filled 2019-03-07: qty 1

## 2019-03-07 NOTE — Discharge Instructions (Addendum)
Please take previously prescribed medications for vomiting.

## 2019-03-07 NOTE — ED Provider Notes (Signed)
[facility] Provider Note   CSN: [id number] Arrival date & time: 03/07/19  1725    History   Chief Complaint Chief Complaint  Patient presents with  . Emesis    HPI Angela CoonsKeira D N Christian is a 23 y.o. female.     HPI  23 year old female with history of schizophrenia and bipolar with cyclic vomiting syndrome commonly triggered by stress.  Sick contacts at home with vomiting and diarrhea.  No fevers.  No trauma.  Past Medical History:  Diagnosis Date  . Anxiety   . Bipolar disease, manic (HCC)   . Cyclical vomiting syndrome   . Depression   . Schizophrenia [facility](HCC)     Patient Active Problem List   Diagnosis Date Noted  . Normal postpartum course 06/15/2018  . Indication for care in labor and delivery, antepartum 06/13/2018  . Cannabis abuse, continuous 04/02/2018  . Gastroesophageal reflux disease 04/02/2018  . [redacted] weeks gestation of pregnancy 04/02/2018  . Vomiting pregnancy 04/02/2018  . MDD (major depressive disorder) 09/10/2015  . Major depressive disorder, recurrent episode (HCC) 09/10/2015  . Polysubstance abuse (HCC) 09/10/2015  . Unspecified mood (affective) disorder (HCC) 09/10/2015  . Secondary amenorrhea 02/27/2015  . Cyclical vomiting syndrome 02/10/2015    Past Surgical History:  Procedure Laterality Date  . TONSILLECTOMY       OB History    Gravida  1   Para  1   Term  1   Preterm  0   AB  0   Living  1     SAB  0   TAB  0   Ectopic  0   Multiple  0   Live Births  1            Home Medications    Prior to Admission medications   Medication Sig Start Date End Date Taking? Authorizing Provider  acetaminophen (TYLENOL) 500 MG tablet Take 500 mg by mouth every 6 (six) hours as needed for mild pain.    [provider]  doxylamine, Sleep, (UNISOM) 25 MG tablet Take 25 mg by mouth at bedtime as needed (nausea).    [provider]  FOLIC ACID PO Take 1 tablet by mouth daily.     [provider]  ibuprofen (ADVIL,MOTRIN) 600 MG tablet Take 1 tablet (600 mg total) by mouth every 6 (six) hours. 06/15/18   Dale DurhamMontana, Jade, FNP  metoCLOPramide (REGLAN) 10 MG tablet Take 1 tablet (10 mg total) by mouth 3 (three) times daily before meals. 11/15/18   Rennis HardingWurst, Brittany, PA-C    Family History Family History  Problem Relation Age of Onset  . Hypertension Mother     Social History Social History   Tobacco Use  . Smoking status: Former Smoker    Packs/day: 0.00    Types: Cigarettes    Quit date: 10/08/2017    Years since quitting: 1.4  . Smokeless tobacco: Never Used  Substance Use Topics  . Alcohol use: Not Currently    Comment: varies from few shots of liquor to "whole bottle" daily  . Drug use: Yes    Types: Marijuana    Comment:  TODAY 11-25/19     Allergies   Patient has no known allergies.   Review of Systems Review of Systems  Constitutional: Positive for activity change. Negative for fever.  HENT: Negative for congestion.   Respiratory: Negative for cough and shortness of breath.   Cardiovascular: Negative for chest pain.  Gastrointestinal: Positive  for abdominal pain, diarrhea and vomiting.  Genitourinary: Negative for dysuria.  Skin: Negative for rash.     Physical Exam Updated Vital Signs BP 137/77 (BP Location: Right Arm)   Pulse 87   Temp 98.3 F (36.8 C) (Oral)   Resp 20   SpO2 99%   Physical Exam Vitals signs and nursing note reviewed.  Constitutional:      General: She is not in acute distress.    Appearance: She is well-developed.  HENT:     Head: Normocephalic and atraumatic.     Right Ear: Tympanic membrane normal.     Left Ear: Tympanic membrane normal.     Nose: No congestion or rhinorrhea.  Eyes:     Extraocular Movements: Extraocular movements intact.     Conjunctiva/sclera: Conjunctivae normal.     Pupils: Pupils are equal, round, and reactive to light.  Neck:     Musculoskeletal: Neck supple.   Cardiovascular:     Rate and Rhythm: Normal rate and regular rhythm.     Heart sounds: No murmur.  Pulmonary:     Effort: Pulmonary effort is normal. No respiratory distress.     Breath sounds: Normal breath sounds.  Abdominal:     Palpations: Abdomen is soft.     Tenderness: There is no abdominal tenderness. There is no guarding or rebound.  Skin:    General: Skin is warm and dry.     Capillary Refill: Capillary refill takes less than 2 seconds.  Neurological:     General: No focal deficit present.     Mental Status: She is alert.     Motor: No weakness.     Gait: Gait normal.      ED Treatments / Results  Labs (all labs ordered are listed, but only abnormal results are displayed) Labs Reviewed  COMPREHENSIVE METABOLIC PANEL - Abnormal; Notable for the following components:      Result Value   BUN <5 (*)    All other components within normal limits  SARS CORONAVIRUS 2  LIPASE, BLOOD  POC URINE PREG, ED    EKG None  Radiology No results found.  Procedures Procedures (including critical care time)  Medications Ordered in ED Medications  ondansetron (ZOFRAN-ODT) disintegrating tablet 4 mg (4 mg Oral Given 03/07/19 1744)  promethazine (PHENERGAN) injection 25 mg (25 mg Intravenous Given 03/07/19 1831)  sodium chloride 0.9 % bolus 1,000 mL (0 mLs Intravenous Stopped 03/07/19 2122)     Initial Impression / Assessment and Plan / ED Course  I have reviewed the triage vital signs and the nursing notes.  Pertinent labs & imaging results that were available during my care of the patient were reviewed by me and considered in my medical decision making (see chart for details).        [name] was evaluated in Emergency Department on 03/07/2019 for the symptoms described in the history of present illness. She was evaluated in the context of the global COVID-19 pandemic, which necessitated consideration that the patient might be at risk for infection with the  SARS-CoV-2 virus that causes COVID-19. Institutional protocols and algorithms that pertain to the evaluation of patients at risk for COVID-19 are in a state of rapid change based on information released by regulatory bodies including the CDC and federal and state organizations. These policies and algorithms were followed during the patient's care in the ED.  Patient is overall well appearing with symptoms consistent with vomiting episode potentially related to cyclical vomiting  syndrome..  Exam notable for hemodynamically appropriate and stable on room air with normal saturations.  Lungs clear to auscultation bilaterally good air exchange.  Normal cardiac exam.  Benign abdomen.  Retching intermittently throughout exam..  I have considered the following causes of vomiting: Abdominal catastrophe appendicitis obstruction, and other serious bacterial illnesses.  Patient's presentation is not consistent with any of these causes of vomiting.  Improvement of symptoms after IV Phenergan bolus.  Lab work returned reassuring.  I reviewed.    On reassessment patient remains appropriate with significant improvement of symptoms and is appropriate for discharge with continued outpatient management.  Return precautions discussed with family prior to discharge and they were advised to follow with pcp as needed if symptoms worsen or fail to improve.    Final Clinical Impressions(s) / ED Diagnoses   Final diagnoses:  Cyclical vomiting syndrome not associated with migraine    ED Discharge Orders    None       Charlett Noseeichert, Rayni Nemitz J, MD 03/07/19 2139

## 2019-03-07 NOTE — ED Triage Notes (Signed)
Patient being seen for vomiting starting when she arrived at the ED. Patient states she has "sickly vomiting syndrome" since she was 23 years old.

## 2019-03-07 NOTE — ED Notes (Signed)
Went over d/c paperwork and significant other verbalized understanding. Pt was alert and no distress was noted when ambulated to exit.

## 2019-06-12 ENCOUNTER — Other Ambulatory Visit: Payer: Self-pay

## 2019-06-12 DIAGNOSIS — Z20822 Contact with and (suspected) exposure to covid-19: Secondary | ICD-10-CM

## 2019-06-13 LAB — NOVEL CORONAVIRUS, NAA: SARS-CoV-2, NAA: NOT DETECTED

## 2019-06-28 IMAGING — US US MFM UA CORD DOPPLER
1 series · 13 of 28 positions shown · non-contrast
Comparison: none

[Series 1: us mfm ua cord doppler · 79 acquisitions, 13 frames shown]
[im 3/79]
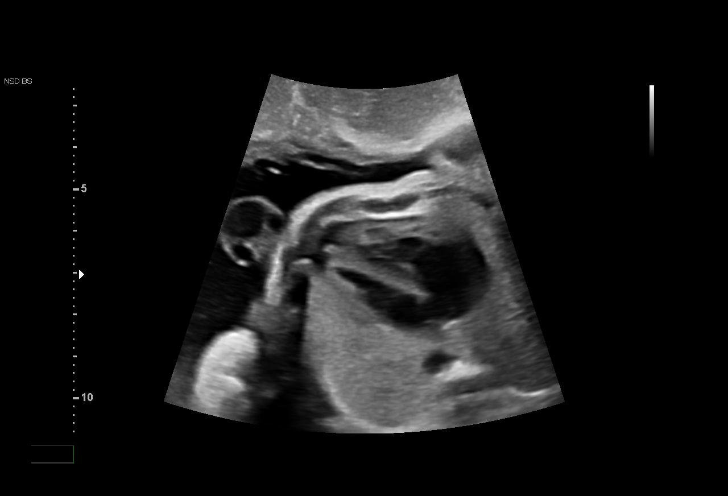
[im 9/79]
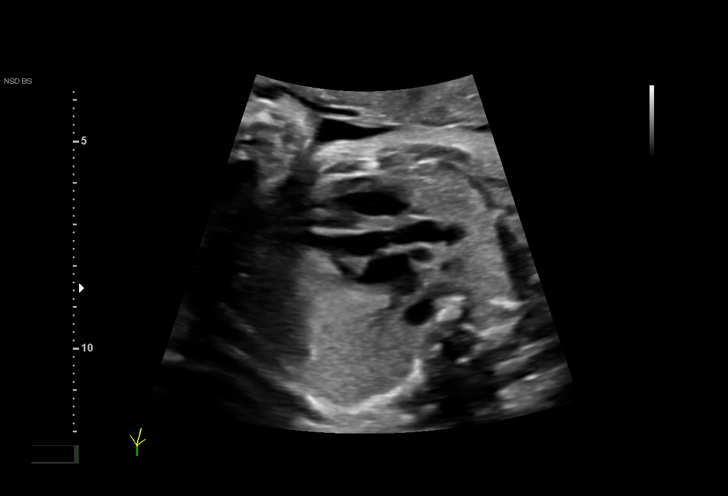
[im 15/79]
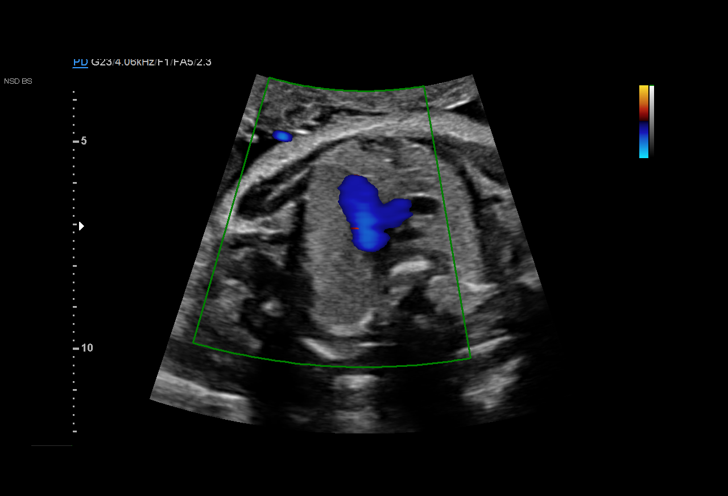
[im 21/79]
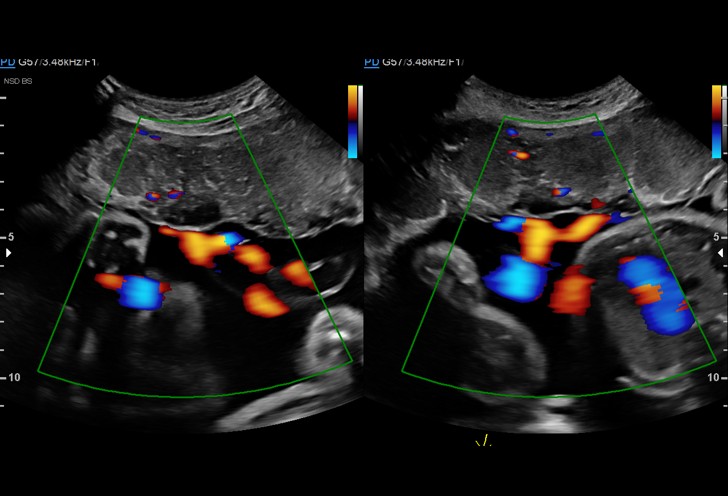
[im 27/79]
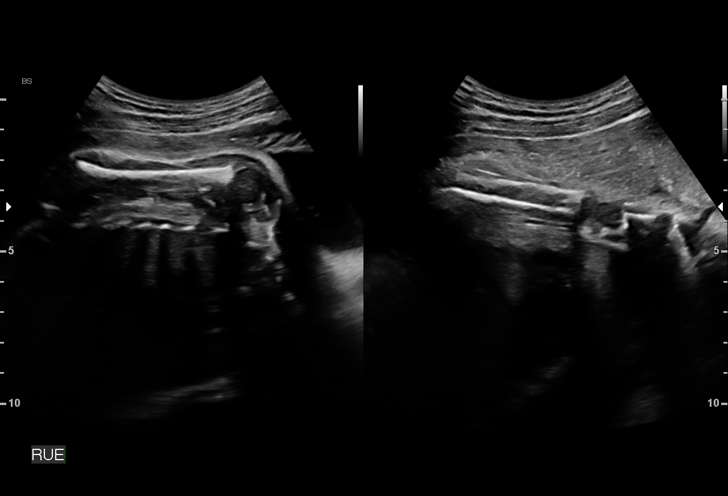
[im 32/79]
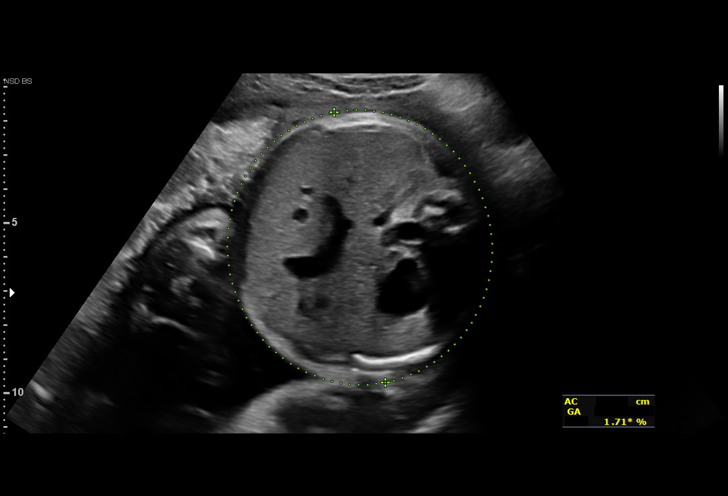
[im 41/79]
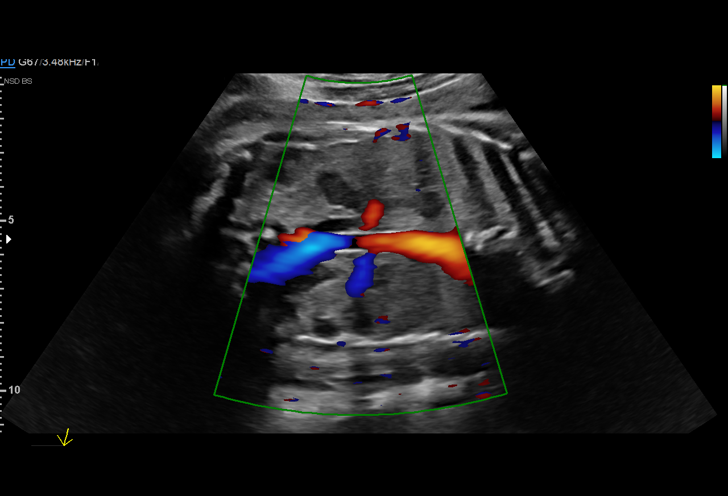
[im 47/79]
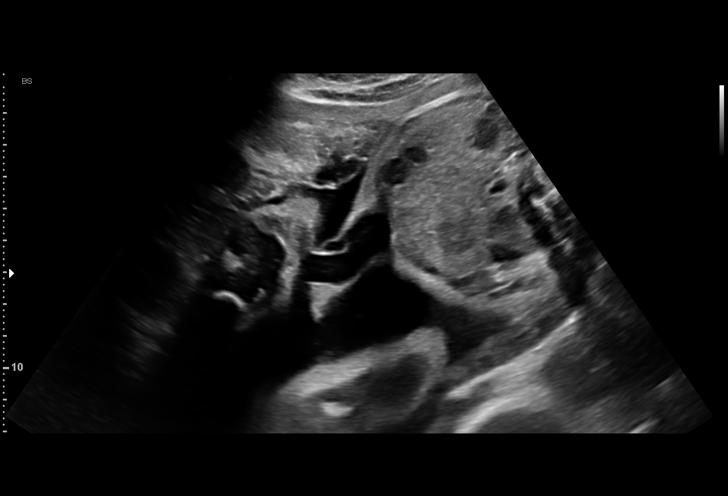
[im 53/79]
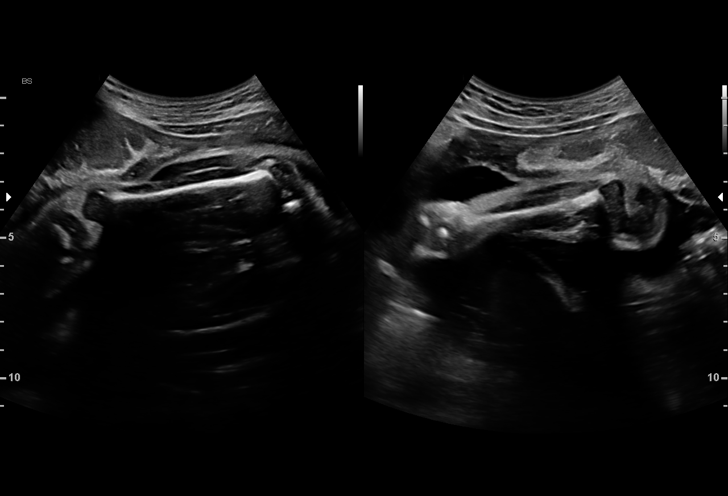
[im 58/79]
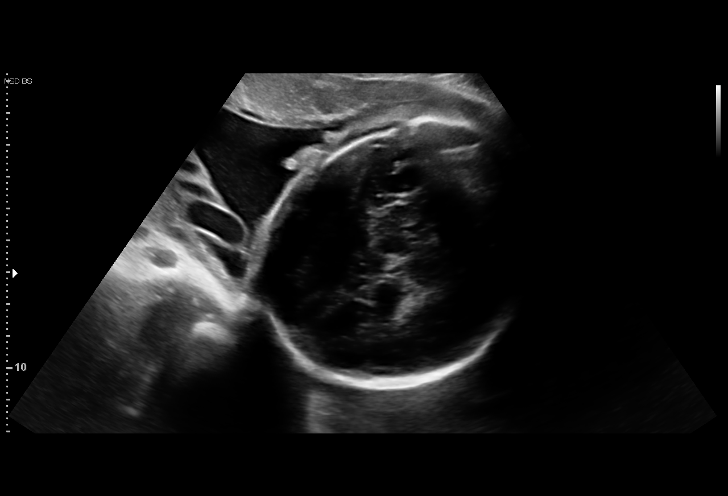
[im 64/79]
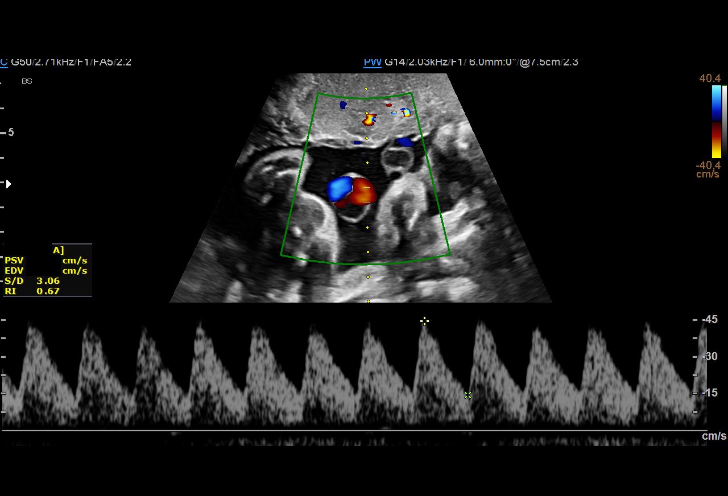
[im 70/79]
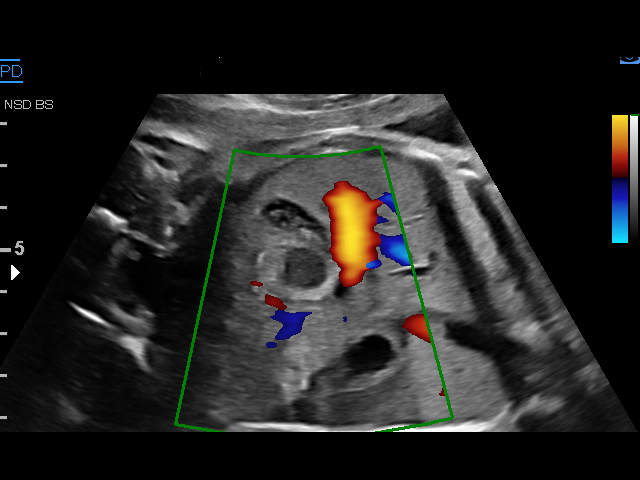
[im 76/79]
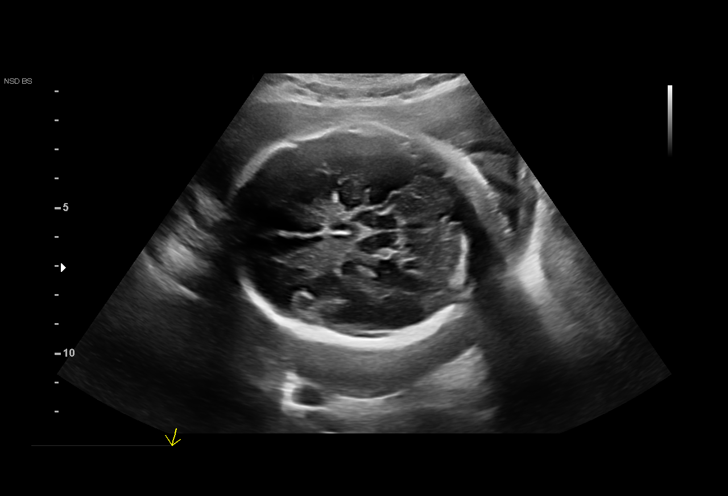

[13 of 28 positions shown; findings below may reference images not displayed]

Obstetrics &
Gynecology
[address]
[place].

2  US MFM UA CORD DOPPLER               76820.02     JEANPAUL CLAIBORNE

Indications

Encounter for antenatal screening for
malformations
31 weeks gestation of pregnancy
Maternal care for known or suspected poor
fetal growth, second trimester, not applicable
or unspecified
Other mental disorder complicating
pregnancy, second trimester
Marijuana Abuse (+ UDS 03/11/18)
Fetal Evaluation

Num Of Fetuses:         1
Fetal Heart Rate(bpm):  140
Cardiac Activity:       Observed
Presentation:           Cephalic
Placenta:               Anterior
P. Cord Insertion:      Visualized, central

Amniotic Fluid
AFI FV:      Within normal limits

AFI Sum(cm)     %Tile       Largest Pocket(cm)
16.19           58
RUQ(cm)       RLQ(cm)       LUQ(cm)        LLQ(cm)
5.16
Biometry

BPD:      78.2  mm     G. Age:  31w 3d         30  %    CI:         74.8   %    70 - 86
FL/HC:      19.5   %    19.1 -
HC:      286.9  mm     G. Age:  31w 3d         13  %    HC/AC:      1.14        0.96 -
AC:      252.5  mm     G. Age:  29w 3d          3  %    FL/BPD:     71.5   %    71 - 87
FL:       55.9  mm     G. Age:  29w 3d        < 3  %    FL/AC:      22.1   %    20 - 24
HUM:      51.9  mm     G. Age:  30w 2d         22  %

LV:        6.7  mm

Est. FW:    4345  gm      3 lb 3 oz     21  %
OB History

Gravidity:    1         Term:   0        Prem:   0        SAB:   0
TOP:          0       Ectopic:  0        Living: 0
Gestational Age

LMP:           31w 5d        Date:  09/09/17                 EDD:   06/16/18
U/S Today:     30w 3d                                        EDD:   06/25/18
Best:          31w 5d     Det. By:  LMP  (09/09/17)          EDD:   06/16/18
Anatomy

Cranium:               Appears normal         LVOT:                   Appears normal
Cavum:                 Appears normal         Aortic Arch:            Appears normal
Ventricles:            Appears normal         Ductal Arch:            Appears normal
Choroid Plexus:        Appears normal         Diaphragm:              Appears normal
Cerebellum:            Appears normal         Stomach:                Appears normal, left
sided
Posterior Fossa:       Appears normal         Abdomen:                Cholelithiasis
Nuchal Fold:           Not applicable (>20    Abdominal Wall:         Appears nml (cord
wks GA)                                        insert, abd wall)
Face:                  Appears normal         Cord Vessels:           Appears normal (3
(orbits and profile)                           vessel cord)
Lips:                  Appears normal         Kidneys:                Appear normal
Palate:                Not well visualized    Bladder:                Appears normal
Thoracic:              Appears normal         Spine:                  Not well visualized
Heart:                 Appears normal         Upper Extremities:      Visualized
(4CH, axis, and
situs)
RVOT:                  Appears normal         Lower Extremities:      Visualized

Other:  Fetus appears to be a male. Technically difficult due to advanced GA
and fetal position.
Doppler - Fetal Vessels

Umbilical Artery
S/D     %tile                                            ADFV    RDFV
2.71       51                                                No      No

Cervix Uterus Adnexa
Cervix
Not visualized (advanced GA >27wks)
Comments

Today's examined was overall normal EFW however, the AC
& FL was < 5th. I discussed today' finding with Ms. Sultane.
We discussed the evaluation and mangement of IUGR
Lastly, the umbilical artery Dopplers Chandrasekhar normal.
Impression

Normal growth with < 5th AC.
Normal UA Doppler
Recommendations

Initiate weekly BPP with UA Dopplers
Follow up growth in 3-4 weeks.

## 2019-09-06 ENCOUNTER — Other Ambulatory Visit: Payer: Self-pay

## 2019-09-06 ENCOUNTER — Ambulatory Visit (HOSPITAL_COMMUNITY)
Admission: EM | Admit: 2019-09-06 | Discharge: 2019-09-06 | Disposition: A | Discharge status: Home / Self Care | Payer: Self-pay | Attending: Family Medicine | Admitting: Family Medicine

## 2019-09-06 ENCOUNTER — Encounter (HOSPITAL_COMMUNITY): Payer: Self-pay

## 2019-09-06 DIAGNOSIS — R1115 Cyclical vomiting syndrome unrelated to migraine: Secondary | ICD-10-CM

## 2019-09-06 DIAGNOSIS — R030 Elevated blood-pressure reading, without diagnosis of hypertension: Secondary | ICD-10-CM

## 2019-09-06 MED ORDER — PROMETHAZINE HCL 25 MG PO TABS: 25.0000 mg | ORAL_TABLET | Freq: Four times a day (QID) | ORAL | 0 refills | Status: DC | PRN

## 2019-09-06 MED ORDER — METOCLOPRAMIDE HCL 5 MG/ML IJ SOLN
INTRAMUSCULAR | Status: AC
  Filled 2019-09-06: qty 2

## 2019-09-06 MED ORDER — METOCLOPRAMIDE HCL 5 MG/ML IJ SOLN
10.0000 mg | Freq: Once | INTRAMUSCULAR | Status: AC
  Administered 2019-09-06: 10 mg via INTRAMUSCULAR

## 2019-09-06 NOTE — Discharge Instructions (Addendum)
Your blood pressure was noted to be elevated during your visit today. You may return here within the next few days to recheck if unable to see your primary care doctor. If your blood pressure remains persistently elevated, you may need to begin taking a medication.  BP (!) 146/112 (BP Location: Right Arm)   Pulse 86   Temp 97.8 F (36.6 C) (Oral)   Resp 16   SpO2 100%

## 2019-09-06 NOTE — ED Notes (Signed)
Patient refused to let this RN complete triage at this time. Suddenly exclaimed "I need a minute, can you leave?"  Will provide pt privacy and attempt to complete triage when the patient is done with her moment.

## 2019-09-06 NOTE — ED Provider Notes (Addendum)
[facility] CARE CENTER   [id number] 09/06/19 Arrival Time: 1651  ASSESSMENT & PLAN:  1. Cyclical vomiting syndrome   2. Elevated blood pressure reading without diagnosis of hypertension     Benign abdominal exam. No indications for urgent abdominal/pelvic imaging at this time. Discussed.  Meds ordered this encounter  Medications  . metoCLOPramide (REGLAN) injection 10 mg  . promethazine (PHENERGAN) 25 MG tablet    Sig: Take 1 tablet (25 mg total) by mouth every 6 (six) hours as needed for nausea or vomiting.    Dispense:  15 tablet    Refill:  0     Discharge Instructions     Your blood pressure was noted to be elevated during your visit today. You may return here within the next few days to recheck if unable to see your primary care doctor. If your blood pressure remains persistently elevated, you may need to begin taking a medication.  BP (!) 146/112 (BP Location: Right Arm)   Pulse 86   Temp 97.8 F (36.6 C) (Oral)   Resp 16   SpO2 100%     Follow-up Information    [facility] EMERGENCY DEPARTMENT.   Specialty: Emergency Medicine Why: If symptoms worsen in any way. Contact information: [address] [id number] mc [place] [address] [phone]           Reviewed expectations re: course of current medical issues. Questions answered. Outlined signs and symptoms indicating need for more acute intervention. Patient verbalized understanding. After Visit Summary given.   SUBJECTIVE: History from: patient. Angela Christian is a 24 y.o. female with history of cyclical vomiting syndrome reports exacerbation over the past 3-4 days. Mild and cramping abdominal discomfort that is typical for her exacerbations. Unclear trigger. Sips fluids but overall decreased PO intake. No diarrhea. No recent illnesses. Slightly decreased urination. Afebrile. Last exacerbation around 06/2019. Ambulatory without difficulty. Current symptoms  have caused her to miss work; requests note.   Increased blood pressure noted today. Reports that she has not been treated for hypertension in the past.  She reports no chest pain on exertion, no dyspnea on exertion, no swelling of ankles, no orthostatic dizziness or lightheadedness, no orthopnea or paroxysmal nocturnal dyspnea, no palpitations and no intermittent claudication symptoms.   Past Surgical History:  Procedure Laterality Date  . TONSILLECTOMY       OBJECTIVE:  Vitals:   09/06/19 1715  BP: (!) 146/112  Pulse: 86  Resp: 16  Temp: 97.8 F (36.6 C)  TempSrc: Oral  SpO2: 100%    General appearance: alert, oriented, no acute distress; appears fatigued HEENT: [place]; AT; oropharynx moist Lungs: unlabored respirations Abdomen: soft; without distention; without guarding or rebound tenderness Extremities: without LE edema; symmetrical; without gross deformities Skin: warm and dry Neurologic: normal gait Psychological: alert and cooperative; normal mood and affect   No Known Allergies                                             Past Medical History:  Diagnosis Date  . Anxiety   . Bipolar disease, manic (HCC)   . Cyclical vomiting syndrome   . Depression   . Schizophrenia [facility])     Social History   Socioeconomic History  . Marital status: Single    Spouse name: Not on file  . Number of children: Not on  file  . Years of education: Not on file  . Highest education level: Not on file  Occupational History  . Not on file  Tobacco Use  . Smoking status: Former Smoker    Packs/day: 0.00    Types: Cigarettes    Quit date: 10/08/2017    Years since quitting: 1.9  . Smokeless tobacco: Never Used  Substance and Sexual Activity  . Alcohol use: Not Currently    Alcohol/week: 14.0 standard drinks    Types: 14 Standard drinks or equivalent per week    Comment: daily  . Drug use: Yes    Types: Marijuana    Comment: last use 09/06/19,  . Sexual activity: Yes    Birth  control/protection: None  Other Topics Concern  . Not on file  Social History Narrative  . Not on file   Social Determinants of Health   Financial Resource Strain:   . Difficulty of Paying Living Expenses: Not on file  Food Insecurity:   . Worried About Charity fundraiser in the Last Year: Not on file  . Ran Out of Food in the Last Year: Not on file  Transportation Needs:   . Lack of Transportation (Medical): Not on file  . Lack of Transportation (Non-Medical): Not on file  Physical Activity:   . Days of Exercise per Week: Not on file  . Minutes of Exercise per Session: Not on file  Stress:   . Feeling of Stress : Not on file  Social Connections:   . Frequency of Communication with Friends and Family: Not on file  . Frequency of Social Gatherings with Friends and Family: Not on file  . Attends Religious Services: Not on file  . Active Member of Clubs or Organizations: Not on file  . Attends Archivist Meetings: Not on file  . Marital Status: Not on file  Intimate Partner Violence:   . Fear of Current or Ex-Partner: Not on file  . Emotionally Abused: Not on file  . Physically Abused: Not on file  . Sexually Abused: Not on file    Family History  Problem Relation Age of Onset  . Hypertension Mother      Vanessa Kick, MD 09/06/19 1730    Vanessa Kick, MD 09/06/19 [phone]

## 2019-09-06 NOTE — ED Triage Notes (Signed)
Patient presents to Urgent Care with complaints of vomiting since 4 days ago. Patient reports she has [facility] and is requesting a shot of phenergan and a work note.

## 2019-10-16 ENCOUNTER — Encounter (HOSPITAL_COMMUNITY): Payer: Self-pay

## 2019-10-16 ENCOUNTER — Other Ambulatory Visit: Payer: Self-pay

## 2019-10-16 ENCOUNTER — Ambulatory Visit (HOSPITAL_COMMUNITY)
Admission: EM | Admit: 2019-10-16 | Discharge: 2019-10-16 | Disposition: A | Discharge status: Home / Self Care | Payer: Medicaid Other | Attending: Emergency Medicine | Admitting: Emergency Medicine

## 2019-10-16 DIAGNOSIS — Z20822 Contact with and (suspected) exposure to covid-19: Secondary | ICD-10-CM | POA: Diagnosis not present

## 2019-10-16 DIAGNOSIS — J069 Acute upper respiratory infection, unspecified: Secondary | ICD-10-CM

## 2019-10-16 DIAGNOSIS — Z87891 Personal history of nicotine dependence: Secondary | ICD-10-CM | POA: Diagnosis not present

## 2019-10-16 DIAGNOSIS — Z8249 Family history of ischemic heart disease and other diseases of the circulatory system: Secondary | ICD-10-CM | POA: Diagnosis not present

## 2019-10-16 DIAGNOSIS — R112 Nausea with vomiting, unspecified: Secondary | ICD-10-CM | POA: Diagnosis present

## 2019-10-16 MED ORDER — PROMETHAZINE HCL 25 MG RE SUPP: 25.0000 mg | Freq: Four times a day (QID) | RECTAL | 0 refills | Status: DC | PRN

## 2019-10-16 MED ORDER — METOCLOPRAMIDE HCL 5 MG/ML IJ SOLN
INTRAMUSCULAR | Status: AC
  Filled 2019-10-16: qty 2

## 2019-10-16 MED ORDER — PROMETHAZINE HCL 25 MG PO TABS: 25.0000 mg | ORAL_TABLET | Freq: Four times a day (QID) | ORAL | 0 refills | Status: DC | PRN

## 2019-10-16 MED ORDER — METOCLOPRAMIDE HCL 5 MG/ML IJ SOLN
10.0000 mg | Freq: Once | INTRAMUSCULAR | Status: AC
  Administered 2019-10-16: 10 mg via INTRAMUSCULAR

## 2019-10-16 NOTE — ED Triage Notes (Signed)
Pt c/o HA, sinus pressure, runny nose, cough, and n/v with general lower abdom pain since Saturday. Denies dysuria sx, loss of taste/smell.  States last emesis this morning and has retained a popsicle.  Pt states her son has developed a runny nose and cough as well.

## 2019-10-16 NOTE — ED Notes (Signed)
Pt declined zofran sl; states, "it doesn't work on me".

## 2019-10-16 NOTE — ED Provider Notes (Signed)
[facility]    CSN: [id number] Arrival date & time: 10/16/19  1916      History   Chief Complaint Chief Complaint  Patient presents with  . Emesis    HPI Angela Christian is a 24 y.o. female history of cyclical vomiting syndrome, presenting today for evaluation of nausea vomiting and URI symptoms. Patient notes over the past 3-4 days she has had recurrent nausea vomiting and abdominal pain. Has also had associated sinus congestion and cough. Reports close sick contacts with GI symptoms. She denies diarrhea. Reports more sensations of constipation. Denies fevers. Typically responds to phenergan. Prior GI work up unremarkable. Episode similar to previous vomiting spells.   HPI  Past Medical History:  Diagnosis Date  . Anxiety   . Bipolar disease, manic (HCC)   . Cyclical vomiting syndrome   . Depression   . Schizophrenia [facility])     Patient Active Problem List   Diagnosis Date Noted  . Normal postpartum course 06/15/2018  . Indication for care in labor and delivery, antepartum 06/13/2018  . Cannabis abuse, continuous 04/02/2018  . Gastroesophageal reflux disease 04/02/2018  . [redacted] weeks gestation of pregnancy 04/02/2018  . Vomiting pregnancy 04/02/2018  . MDD (major depressive disorder) 09/10/2015  . Major depressive disorder, recurrent episode (HCC) 09/10/2015  . Polysubstance abuse (HCC) 09/10/2015  . Unspecified mood (affective) disorder (HCC) 09/10/2015  . Secondary amenorrhea 02/27/2015  . Cyclical vomiting syndrome 02/10/2015    Past Surgical History:  Procedure Laterality Date  . TONSILLECTOMY      OB History    Gravida  1   Para  1   Term  1   Preterm  0   AB  0   Living  1     SAB  0   TAB  0   Ectopic  0   Multiple  0   Live Births  1            Home Medications    Prior to Admission medications   Medication Sig Start Date End Date Taking? Authorizing Provider  acetaminophen (TYLENOL) 500 MG tablet Take 500 mg by  mouth every 6 (six) hours as needed for mild pain.    [provider]  doxylamine, Sleep, (UNISOM) 25 MG tablet Take 25 mg by mouth at bedtime as needed (nausea).    [provider]  FOLIC ACID PO Take 1 tablet by mouth daily.    [provider]  ibuprofen (ADVIL,MOTRIN) 600 MG tablet Take 1 tablet (600 mg total) by mouth every 6 (six) hours. 06/15/18   Dale , FNP  promethazine (PHENERGAN) 25 MG suppository Place 1 suppository (25 mg total) rectally every 6 (six) hours as needed for nausea or vomiting. 10/16/19   Ilian Wessell C, PA-C  promethazine (PHENERGAN) 25 MG tablet Take 1 tablet (25 mg total) by mouth every 6 (six) hours as needed for nausea or vomiting. 10/16/19   Shateria Paternostro C, PA-C  metoCLOPramide (REGLAN) 10 MG tablet Take 1 tablet (10 mg total) by mouth 3 (three) times daily before meals. 11/15/18 10/16/19  Rennis Harding, PA-C    Family History Family History  Problem Relation Age of Onset  . Hypertension Mother     Social History Social History   Tobacco Use  . Smoking status: Former Smoker    Packs/day: 0.00    Types: Cigarettes    Quit date: 10/08/2017    Years since quitting: 2.0  . Smokeless tobacco: Never Used  Substance Use Topics  . Alcohol use: Not Currently    Alcohol/week: 14.0 standard drinks    Types: 14 Standard drinks or equivalent per week    Comment: daily  . Drug use: Yes    Types: Marijuana    Comment: last use 09/06/19,     Allergies   Patient has no known allergies.   Review of Systems Review of Systems  Constitutional: Positive for fatigue. Negative for activity change, appetite change, chills and fever.  HENT: Positive for congestion, rhinorrhea and sinus pressure. Negative for ear pain, sore throat and trouble swallowing.   Eyes: Negative for discharge and redness.  Respiratory: Positive for cough. Negative for chest tightness and shortness of breath.   Cardiovascular: Negative for chest pain.    Gastrointestinal: Positive for abdominal pain, constipation, nausea and vomiting. Negative for diarrhea.  Musculoskeletal: Negative for myalgias.  Skin: Negative for rash.  Neurological: Positive for dizziness and headaches. Negative for light-headedness.     Physical Exam Triage Vital Signs ED Triage Vitals  Enc Vitals Group     BP 10/16/19 1949 108/81     Pulse Rate 10/16/19 1949 (!) 113     Resp 10/16/19 1949 18     Temp 10/16/19 1949 98.7 F (37.1 C)     Temp Source 10/16/19 1949 Oral     SpO2 10/16/19 1949 100 %     Weight --      Height --      Head Circumference --      Peak Flow --      Pain Score 10/16/19 1946 7     Pain Loc --      Pain Edu? --      Excl. in GC? --    No data found.  Updated Vital Signs BP 108/81 (BP Location: Left Arm)   Pulse (!) 113   Temp 98.7 F (37.1 C) (Oral)   Resp 18   LMP 10/16/2019   SpO2 100%   Visual Acuity Right Eye Distance:   Left Eye Distance:   Bilateral Distance:    Right Eye Near:   Left Eye Near:    Bilateral Near:     Physical Exam Vitals and nursing note reviewed.  Constitutional:      Appearance: She is well-developed.     Comments: No acute distress  HENT:     Head: Normocephalic and atraumatic.     Ears:     Comments: Bilateral ears without tenderness to palpation of external auricle, tragus and mastoid, EAC's without erythema or swelling, TM's with good bony landmarks and cone of light. Non erythematous.     Nose: Nose normal.     Mouth/Throat:     Comments: Oral mucosa pink and moist, no tonsillar enlargement or exudate. Posterior pharynx patent and nonerythematous, no uvula deviation or swelling. Normal phonation.  Eyes:     Conjunctiva/sclera: Conjunctivae normal.  Cardiovascular:     Rate and Rhythm: Normal rate.     Comments: Initially tachy, improved to 88 on recheck Pulmonary:     Effort: Pulmonary effort is normal. No respiratory distress.     Comments: Breathing comfortably at rest,  CTABL, no wheezing, rales or other adventitious sounds auscultated  Abdominal:     General: There is no distension.     Tenderness: There is abdominal tenderness.     Comments: Soft, non distended, tender to left upper and lower quadrants, negative rebound  Musculoskeletal:        General: Normal  range of motion.     Cervical back: Neck supple.  Skin:    General: Skin is warm and dry.  Neurological:     Mental Status: She is alert and oriented to person, place, and time.      UC Treatments / Results  Labs (all labs ordered are listed, but only abnormal results are displayed) Labs Reviewed  SARS CORONAVIRUS 2 (TAT 6-24 HRS)    EKG   Radiology No results found.  Procedures Procedures (including critical care time)  Medications Ordered in UC Medications  metoCLOPramide (REGLAN) injection 10 mg (10 mg Intramuscular Given 10/16/19 2022)    Initial Impression / Assessment and Plan / UC Course  I have reviewed the triage vital signs and the nursing notes.  Pertinent labs & imaging results that were available during my care of the patient were reviewed by me and considered in my medical decision making (see chart for details).     COVID swab pending, likely viral etiology of URI symptoms. Nausea and vomiting similar to prior spells of cyclical vomiting. Providing reglan IM prior to d/c refilling phenergan- oral and suppositories. Do no suspect abdominal emergency at this time. VSS. Discussed strict return precautions. Patient verbalized understanding and is agreeable with plan.  Final Clinical Impressions(s) / UC Diagnoses   Final diagnoses:  Viral URI with cough  Intractable vomiting with nausea, unspecified vomiting type     Discharge Instructions     Covid test pending, monitor my chart for results We gave you an injection of Reglan today for your nausea, continue with Phenergan oral tablets or suppositories, do not take both together for nausea at home Focus on  fluids, rehydration, patient plenty of water, Pedialyte and Gatorade For nasal congestion May use over-the-counter Zyrtec, Flonase, Mucinex or Sudafed as needed  If developing any increased dizziness, lightheadedness, worsening abdominal pain, persistent vomiting please follow-up in the emergency room   ED Prescriptions    Medication Sig Dispense Auth. Provider   promethazine (PHENERGAN) 25 MG suppository Place 1 suppository (25 mg total) rectally every 6 (six) hours as needed for nausea or vomiting. 12 each Krisann Mckenna C, PA-C   promethazine (PHENERGAN) 25 MG tablet Take 1 tablet (25 mg total) by mouth every 6 (six) hours as needed for nausea or vomiting. 20 tablet Shalee Paolo, [place] C, PA-C     PDMP not reviewed this encounter.   Janith Lima, [place] 10/16/19 2224

## 2019-10-16 NOTE — Discharge Instructions (Signed)
Covid test pending, monitor my chart for results We gave you an injection of Reglan today for your nausea, continue with Phenergan oral tablets or suppositories, do not take both together for nausea at home Focus on fluids, rehydration, patient plenty of water, Pedialyte and Gatorade For nasal congestion May use over-the-counter Zyrtec, Flonase, Mucinex or Sudafed as needed  If developing any increased dizziness, lightheadedness, worsening abdominal pain, persistent vomiting please follow-up in the emergency room

## 2019-10-18 LAB — SARS CORONAVIRUS 2 (TAT 6-24 HRS): SARS Coronavirus 2: NEGATIVE

## 2019-11-20 ENCOUNTER — Encounter (HOSPITAL_COMMUNITY): Payer: Self-pay | Admitting: Emergency Medicine

## 2019-11-20 ENCOUNTER — Ambulatory Visit (HOSPITAL_COMMUNITY)
Admission: EM | Admit: 2019-11-20 | Discharge: 2019-11-20 | Disposition: A | Discharge status: Home / Self Care | Payer: Medicaid Other | Attending: Physician Assistant | Admitting: Physician Assistant

## 2019-11-20 ENCOUNTER — Other Ambulatory Visit: Payer: Self-pay

## 2019-11-20 DIAGNOSIS — Z20822 Contact with and (suspected) exposure to covid-19: Secondary | ICD-10-CM | POA: Diagnosis not present

## 2019-11-20 DIAGNOSIS — R1115 Cyclical vomiting syndrome unrelated to migraine: Secondary | ICD-10-CM | POA: Diagnosis not present

## 2019-11-20 DIAGNOSIS — Z87891 Personal history of nicotine dependence: Secondary | ICD-10-CM | POA: Insufficient documentation

## 2019-11-20 DIAGNOSIS — R112 Nausea with vomiting, unspecified: Secondary | ICD-10-CM | POA: Diagnosis present

## 2019-11-20 DIAGNOSIS — Z8249 Family history of ischemic heart disease and other diseases of the circulatory system: Secondary | ICD-10-CM | POA: Insufficient documentation

## 2019-11-20 MED ORDER — METOCLOPRAMIDE HCL 5 MG/ML IJ SOLN
10.0000 mg | Freq: Once | INTRAMUSCULAR | Status: AC
  Administered 2019-11-20: 10 mg via INTRAMUSCULAR

## 2019-11-20 MED ORDER — METOCLOPRAMIDE HCL 5 MG/ML IJ SOLN
INTRAMUSCULAR | Status: AC
  Filled 2019-11-20: qty 2

## 2019-11-20 MED ORDER — METOCLOPRAMIDE HCL 5 MG/ML IJ SOLN: 10.0000 mg | Freq: Once | INTRAMUSCULAR | Status: DC

## 2019-11-20 MED ORDER — PROMETHAZINE HCL 25 MG PO TABS: 25.0000 mg | ORAL_TABLET | Freq: Four times a day (QID) | ORAL | 0 refills | Status: DC | PRN

## 2019-11-20 MED ORDER — OMEPRAZOLE 20 MG PO CPDR: 20.0000 mg | DELAYED_RELEASE_CAPSULE | Freq: Every day | ORAL | 0 refills | Status: DC

## 2019-11-20 NOTE — Discharge Instructions (Signed)
Take the promethazine as needed for nausea and vomiting.  This will make you sleepy so do not drive, drink or go to work within 8 hours of taking this  Take the Prilosec daily If you have worsening symptoms to include severe abdominal pain, blood in your vomit or a coffee-ground appearance or you are unable to tolerate fluids despite treatment when she did report to the emergency department.  I placed a referral to the GI clinic and they should reach out to you over the next 1 week.  You may also call after 1 week if they have not reached out.  If you are having issues with this please contact the urgent care.  I have also attached an option for primary care please establish with a primary care provider.  If your Covid-19 test is positive, you will receive a phone call from [facility] regarding your results. Negative test results are not called. Both positive and negative results area always visible on MyChart. If you do not have a MyChart account, sign up instructions are in your discharge papers.   Persons who are directed to care for themselves at home may discontinue isolation under the following conditions:   At least 10 days have passed since symptom onset and  At least 24 hours have passed without running a fever (this means without the use of fever-reducing medications) and  Other symptoms have improved.  Persons infected with COVID-19 who never develop symptoms may discontinue isolation and other precautions 10 days after the date of their first positive COVID-19 test.

## 2019-11-20 NOTE — ED Triage Notes (Signed)
PT reports vomiting that started yesterday. No sick contacts that she is aware of. She had a sore throat last week that resolved. Most recent menstrual was not a full menstrual cycle for her and was late onset.

## 2019-11-20 NOTE — ED Provider Notes (Signed)
[facility]    CSN: [id number] Arrival date & time: 11/20/19  1721      History   Chief Complaint Chief Complaint  Patient presents with  . Emesis    HPI Angela Christian is a 24 y.o. female.   Patient with history of cyclic vomiting syndrome presents for 1 day history of nausea and vomiting.  She reports her symptoms started last night with nausea and then she began to vomit.  She reports vomiting throughout much of the night.  She is unable to keep down small amounts of ice otherwise nothing is stay down by mouth.  She denies any blood in her vomit.  Denies any coffee-ground appearance.  Denies any abdominal pain now.  She does report some cramping over the weekend.  She is also had some diarrhea however this is not a new thing for her.  Denies any blood in her stool.  Denies fever, chills or body ache.  No recent sick contacts.  She does report a few days ago she had a little bit of a sore throat and" flulike symptoms" that have mostly resided.  She reports treatment she receives for his cyclic vomiting syndrome in the past 10 to work.  She reports she will receive a shot in clinic and then go out with Phenergan.  She was last seen on 10/16/2019 in this clinic.  Patient reports she does not have a primary care.  Reports she has not followed up with a gastroenterologist for her symptoms.   Patient reports she just completed her last cycle with regard to her menses.     Past Medical History:  Diagnosis Date  . Anxiety   . Bipolar disease, manic (HCC)   . Cyclical vomiting syndrome   . Depression   . Schizophrenia [facility])     Patient Active Problem List   Diagnosis Date Noted  . Normal postpartum course 06/15/2018  . Indication for care in labor and delivery, antepartum 06/13/2018  . Cannabis abuse, continuous 04/02/2018  . Gastroesophageal reflux disease 04/02/2018  . [redacted] weeks gestation of pregnancy 04/02/2018  . Vomiting pregnancy 04/02/2018  . MDD (major  depressive disorder) 09/10/2015  . Major depressive disorder, recurrent episode (HCC) 09/10/2015  . Polysubstance abuse (HCC) 09/10/2015  . Unspecified mood (affective) disorder (HCC) 09/10/2015  . Secondary amenorrhea 02/27/2015  . Cyclical vomiting syndrome 02/10/2015    Past Surgical History:  Procedure Laterality Date  . TONSILLECTOMY      OB History    Gravida  1   Para  1   Term  1   Preterm  0   AB  0   Living  1     SAB  0   TAB  0   Ectopic  0   Multiple  0   Live Births  1            Home Medications    Prior to Admission medications   Medication Sig Start Date End Date Taking? Authorizing Provider  acetaminophen (TYLENOL) 500 MG tablet Take 500 mg by mouth every 6 (six) hours as needed for mild pain.    [provider]  doxylamine, Sleep, (UNISOM) 25 MG tablet Take 25 mg by mouth at bedtime as needed (nausea).    [provider]  FOLIC ACID PO Take 1 tablet by mouth daily.    [provider]  ibuprofen (ADVIL,MOTRIN) 600 MG tablet Take 1 tablet (600 mg total) by mouth every 6 (six)  hours. 06/15/18   Dale San [place]ime[place], FNP  omeprazole (PRILOSEC) 20 MG capsule Take 1 capsule (20 mg total) by mouth daily. 11/20/19   Dorianne Perret, Veryl Speak, PA-C  promethazine (PHENERGAN) 25 MG tablet Take 1 tablet (25 mg total) by mouth every 6 (six) hours as needed for nausea or vomiting. 11/20/19   Timonthy Hovater, Veryl Speak, PA-C  metoCLOPramide (REGLAN) 10 MG tablet Take 1 tablet (10 mg total) by mouth 3 (three) times daily before meals. 11/15/18 10/16/19  Rennis Harding, PA-C    Family History Family History  Problem Relation Age of Onset  . Hypertension Mother     Social History Social History   Tobacco Use  . Smoking status: Former Smoker    Packs/day: 0.00    Types: Cigarettes    Quit date: 10/08/2017    Years since quitting: 2.1  . Smokeless tobacco: Never Used  Substance Use Topics  . Alcohol use: Not Currently    Alcohol/week: 14.0 standard drinks     Types: 14 Standard drinks or equivalent per week    Comment: daily  . Drug use: Yes    Types: Marijuana    Comment: last use 09/06/19,     Allergies   Patient has no known allergies.   Review of Systems Review of Systems  Per HPI Physical Exam Triage Vital Signs ED Triage Vitals  Enc Vitals Group     BP      Pulse      Resp      Temp      Temp src      SpO2      Weight      Height      Head Circumference      Peak Flow      Pain Score      Pain Loc      Pain Edu?      Excl. in GC?    No data found.  Updated Vital Signs BP 116/67   Pulse 93   Temp 98.7 F (37.1 C) (Oral)   Resp 16   LMP 11/16/2019   SpO2 100%   Visual Acuity Right Eye Distance:   Left Eye Distance:   Bilateral Distance:    Right Eye Near:   Left Eye Near:    Bilateral Near:     Physical Exam Vitals and nursing note reviewed.  Constitutional:      General: She is not in acute distress.    Appearance: She is well-developed. She is not ill-appearing or diaphoretic.  HENT:     Head: Normocephalic and atraumatic.  Eyes:     Conjunctiva/sclera: Conjunctivae normal.  Cardiovascular:     Rate and Rhythm: Normal rate and regular rhythm.     Heart sounds: No murmur.  Pulmonary:     Effort: Pulmonary effort is normal. No respiratory distress.     Breath sounds: Normal breath sounds.  Abdominal:     General: Abdomen is flat. There is no distension.     Palpations: Abdomen is soft.     Tenderness: There is abdominal tenderness (mild left-sided tenderness.). There is no right CVA tenderness, left CVA tenderness, guarding or rebound.  Musculoskeletal:     Cervical back: Neck supple.  Skin:    General: Skin is warm and dry.  Neurological:     General: No focal deficit present.     Mental Status: She is alert and oriented to person, place, and time.      UC Treatments / Results  Labs (all labs ordered are listed, but only abnormal results are displayed) Labs Reviewed  SARS  CORONAVIRUS 2 (TAT 6-24 HRS)    EKG   Radiology No results found.  Procedures Procedures (including critical care time)  Medications Ordered in UC Medications  metoCLOPramide (REGLAN) injection 10 mg (10 mg Intramuscular Given 11/20/19 1828)    Initial Impression / Assessment and Plan / UC Course  I have reviewed the triage vital signs and the nursing notes.  Pertinent labs & imaging results that were available during my care of the patient were reviewed by me and considered in my medical decision making (see chart for details).    #Cyclic vomiting syndrome Patient is 24 year old with history of cyclic vomiting syndrome presenting with acute exacerbation.  Abdominal exam is reassuring.  Given short duration and presentation do not believe she is overly dehydrated this time.  No clinical signs of dehydration.  Given response to Reglan and Phenergan treatments, will initiate this in clinic today.  Also discussed Covid testing as she had some recent upper respiratory symptoms now with GI symptoms.  She agrees to [employer] testing.  Discussed GI referral and she agrees to this.  Amatory referral placed.  Primary care follow-up also supplied.  Strict emergency department and return precautions were discussed with patient.  She verbalized understanding. -Also recommended a PPI given she does have a history of GERD and this made in nausea and vomiting. Final Clinical Impressions(s) / UC Diagnoses   Final diagnoses:  Cyclic vomiting syndrome     Discharge Instructions     Take the promethazine as needed for nausea and vomiting.  This will make you sleepy so do not drive, drink or go to work within 8 hours of taking this  Take the Prilosec daily If you have worsening symptoms to include severe abdominal pain, blood in your vomit or a coffee-ground appearance or you are unable to tolerate fluids despite treatment when she did report to the emergency department.  I placed a referral to the GI  clinic and they should reach out to you over the next 1 week.  You may also call after 1 week if they have not reached out.  If you are having issues with this please contact the urgent care.  I have also attached an option for primary care please establish with a primary care provider.  If your Covid-19 test is positive, you will receive a phone call from [facility] regarding your results. Negative test results are not called. Both positive and negative results area always visible on MyChart. If you do not have a MyChart account, sign up instructions are in your discharge papers.   Persons who are directed to care for themselves at home may discontinue isolation under the following conditions:  . At least 10 days have passed since symptom onset and . At least 24 hours have passed without running a fever (this means without the use of fever-reducing medications) and . Other symptoms have improved.  Persons infected with COVID-19 who never develop symptoms may discontinue isolation and other precautions 10 days after the date of their first positive COVID-19 test.     ED Prescriptions    Medication Sig Dispense Auth. Provider   promethazine (PHENERGAN) 25 MG tablet Take 1 tablet (25 mg total) by mouth every 6 (six) hours as needed for nausea or vomiting. 20 tablet Lawyer Washabaugh, Marguerita Beards, PA-C   omeprazole (PRILOSEC) 20 MG capsule Take 1 capsule (20 mg total) by mouth daily.  30 capsule Ranny Wiebelhaus, Veryl Speak, PA-C     PDMP not reviewed this encounter.   Hermelinda Medicus, PA-C 11/20/19 1829

## 2019-11-21 ENCOUNTER — Encounter: Payer: Self-pay | Admitting: Nurse Practitioner

## 2019-11-21 LAB — SARS CORONAVIRUS 2 (TAT 6-24 HRS): SARS Coronavirus 2: NEGATIVE

## 2019-12-09 NOTE — Progress Notes (Deleted)
12/09/2019 [name] [id number] 09-18-1995   CHIEF COMPLAINT:   HISTORY OF PRESENT ILLNESS:  Angela Christian is a 24 year old female with a past medical history of anxiety,depression,  bipolar disorder, schizophrenia.  She has presented to the ED on 02/2019 and urgent care 09/06/2019, 10/16/2019 and 11/20/2019 with nausea and vomiting  She presented to [facility] urgent care [facility] on 11/20/2019 with nausea and vomiting x 1 day.  She had diarrhea over the weekend which is not unusual for her. A few days ago had flu like symptoms. Covid Sars 19 was negative. No other laboratory studies were done. She received Reglan 10mg  IM. She was discharged home on Phenergan 25mg  po Q 6 hours PRN and Prilosec 20mg  daily. She was referred to our office for further GI evaluation.   She presented to [facility] urgent care center on 10/16/2019 with N/V and URI symptoms. She was prescribed Phenergan 25mg  po and PRN and discharged home.        Past Medical History:  Diagnosis Date  . Anxiety   . Bipolar disease, manic ([place])   . Cyclical vomiting syndrome   . Depression   . Schizophrenia [facility])    Past Surgical History:  Procedure Laterality Date  . TONSILLECTOMY      Social History:  Family History:    reports that she quit smoking about 2 years ago. Her smoking use included cigarettes. She smoked 0.00 packs per day. She has never used smokeless tobacco. She reports previous alcohol use of about 14.0 standard drinks of alcohol per week. She reports current drug use. Drug: Marijuana. family history includes Hypertension in her mother. No Known Allergies    Outpatient Encounter Medications as of 12/10/2019  Medication Sig  . acetaminophen (TYLENOL) 500 MG tablet Take 500 mg by mouth every 6 (six) hours as needed for mild pain.  Marland Kitchen doxylamine, Sleep, (UNISOM) 25 MG tablet Take 25 mg by mouth at bedtime as needed (nausea).  . FOLIC ACID PO Take 1 tablet by mouth daily.  Marland Kitchen ibuprofen  (ADVIL,MOTRIN) 600 MG tablet Take 1 tablet (600 mg total) by mouth every 6 (six) hours.  Marland Kitchen omeprazole (PRILOSEC) 20 MG capsule Take 1 capsule (20 mg total) by mouth daily.  . promethazine (PHENERGAN) 25 MG tablet Take 1 tablet (25 mg total) by mouth every 6 (six) hours as needed for nausea or vomiting.  . [DISCONTINUED] metoCLOPramide (REGLAN) 10 MG tablet Take 1 tablet (10 mg total) by mouth 3 (three) times daily before meals.   No facility-administered encounter medications on file as of 12/10/2019.     REVIEW OF SYSTEMS: All other systems reviewed and negative except where noted in the History of Present Illness.   PHYSICAL EXAM: LMP 11/16/2019  General: Well developed ... in no acute distress. Head: Normocephalic and atraumatic. Eyes:  Sclerae non-icteric, conjunctive pink. Ears: Normal auditory acuity. Mouth: Dentition intact. No ulcers or lesions.  Neck: Supple, no lymphadenopathy or thyromegaly.  Lungs: Clear bilaterally to auscultation without wheezes, crackles or rhonchi. Heart: Regular rate and rhythm. No murmur, rub or gallop appreciated.  Abdomen: Soft, nontender, non distended. No masses. No hepatosplenomegaly. Normoactive bowel sounds x 4 quadrants.  Rectal:  Musculoskeletal: Symmetrical with no gross deformities. Skin: Warm and dry. No rash or lesions on visible extremities. Extremities: No edema. Neurological: Alert oriented x 4, no focal deficits.  Psychological:  Alert and cooperative. Normal mood and affect.  ASSESSMENT AND PLAN:  95. 24 year old  female with recurrent nausea and vomiting   2. History of anxiety and depression     CC:  Darr, Veryl Speak, PA-C

## 2019-12-10 ENCOUNTER — Ambulatory Visit: Payer: Medicaid Other | Admitting: Nurse Practitioner

## 2019-12-11 ENCOUNTER — Other Ambulatory Visit: Payer: Self-pay

## 2019-12-11 ENCOUNTER — Encounter (HOSPITAL_COMMUNITY): Payer: Self-pay

## 2019-12-11 ENCOUNTER — Ambulatory Visit (HOSPITAL_COMMUNITY)
Admission: EM | Admit: 2019-12-11 | Discharge: 2019-12-11 | Disposition: A | Discharge status: Home / Self Care | Payer: Medicaid Other | Attending: Physician Assistant | Admitting: Physician Assistant

## 2019-12-11 DIAGNOSIS — Z3202 Encounter for pregnancy test, result negative: Secondary | ICD-10-CM

## 2019-12-11 DIAGNOSIS — R110 Nausea: Secondary | ICD-10-CM | POA: Insufficient documentation

## 2019-12-11 DIAGNOSIS — R109 Unspecified abdominal pain: Secondary | ICD-10-CM

## 2019-12-11 DIAGNOSIS — R300 Dysuria: Secondary | ICD-10-CM

## 2019-12-11 DIAGNOSIS — R1030 Lower abdominal pain, unspecified: Secondary | ICD-10-CM | POA: Insufficient documentation

## 2019-12-11 LAB — POCT URINALYSIS DIP (DEVICE)
Glucose, UA: NEGATIVE mg/dL
Ketones, ur: 40 mg/dL — AB
Leukocytes,Ua: NEGATIVE
Nitrite: POSITIVE — AB
Protein, ur: 100 mg/dL — AB
Specific Gravity, Urine: 1.025 (ref 1.005–1.030)
Urobilinogen, UA: 1 mg/dL (ref 0.0–1.0)
pH: 6 (ref 5.0–8.0)

## 2019-12-11 LAB — POC URINE PREG, ED: Preg Test, Ur: NEGATIVE

## 2019-12-11 MED ORDER — NAPROXEN 500 MG PO TABS
500.0000 mg | ORAL_TABLET | Freq: Two times a day (BID) | ORAL | 0 refills | Status: DC
Start: 2019-12-11 — End: 2020-02-28

## 2019-12-11 MED ORDER — PROMETHAZINE HCL 25 MG PO TABS
25.0000 mg | ORAL_TABLET | Freq: Four times a day (QID) | ORAL | 0 refills | Status: DC | PRN
Start: 2019-12-11 — End: 2020-02-28

## 2019-12-11 MED ORDER — CEPHALEXIN 500 MG PO CAPS
500.0000 mg | ORAL_CAPSULE | Freq: Two times a day (BID) | ORAL | 0 refills | Status: AC
Start: 2019-12-11 — End: 2019-12-18

## 2019-12-11 NOTE — Discharge Instructions (Signed)
Begin keflex for UTI, drink plenty of fluids Phenergan for nausea Naprosyn twice daily as needed for pain  Follow up if not improving or worsening

## 2019-12-11 NOTE — ED Triage Notes (Signed)
Pt reports abdominal pain, vomittingx 1 day; increase urinary frequency 2-3 weeks.

## 2019-12-12 NOTE — ED Provider Notes (Signed)
[facility]    CSN: [id number] Arrival date & time: 12/11/19  1848      History   Chief Complaint Chief Complaint  Patient presents with  . Abdominal Pain  . Dysuria    HPI Angela WINSTANLEY is a 24 y.o. female history of cyclical vomiting syndrome, presenting today for evaluation of abdominal pain.  Patient reports that over the past few days she has had increased lower abdominal pain as well as some nausea and vomiting.  She reports that she has had urinary frequency, urgency and incomplete voiding for approximately 2 to 3 weeks.  She is concerned about possible UTI.  She reports sharp pains in her lower abdomen.  This feels slightly different than her typical episodes of vomiting.  She reports she was supposed to have a gastroenterology appointment yesterday, but missed it due to feeling bad.  She has not taken any medicines for her symptoms.  She denies blood in stool or vomit.  Denies changes in bowel movements-notes that it is normal for her to alternate between constipation and diarrhea, but recently she has felt her stools have been harder than normal.  She does report a slight increase in vaginal discharge, denies itching or irritation.  Denies specific concerns for STDs, but does report she occasionally partakes in unprotected sex.   HPI  Past Medical History:  Diagnosis Date  . Anxiety   . Bipolar disease, manic (HCC)   . Cyclical vomiting syndrome   . Depression   . Schizophrenia [facility])     Patient Active Problem List   Diagnosis Date Noted  . Normal postpartum course 06/15/2018  . Indication for care in labor and delivery, antepartum 06/13/2018  . Cannabis abuse, continuous 04/02/2018  . Gastroesophageal reflux disease 04/02/2018  . [redacted] weeks gestation of pregnancy 04/02/2018  . Vomiting pregnancy 04/02/2018  . MDD (major depressive disorder) 09/10/2015  . Major depressive disorder, recurrent episode (HCC) 09/10/2015  . Polysubstance abuse (HCC)  09/10/2015  . Unspecified mood (affective) disorder (HCC) 09/10/2015  . Secondary amenorrhea 02/27/2015  . Cyclical vomiting syndrome 02/10/2015    Past Surgical History:  Procedure Laterality Date  . TONSILLECTOMY      OB History    Gravida  1   Para  1   Term  1   Preterm  0   AB  0   Living  1     SAB  0   TAB  0   Ectopic  0   Multiple  0   Live Births  1            Home Medications    Prior to Admission medications   Medication Sig Start Date End Date Taking? Authorizing Provider  calcium carbonate (TUMS - DOSED IN MG ELEMENTAL CALCIUM) 500 MG chewable tablet Chew 1 tablet by mouth daily.   Yes [provider]  cephALEXin (KEFLEX) 500 MG capsule Take 1 capsule (500 mg total) by mouth 2 (two) times daily for 7 days. 12/11/19 12/18/19  Jewell Haught C, PA-C  naproxen (NAPROSYN) 500 MG tablet Take 1 tablet (500 mg total) by mouth 2 (two) times daily. 12/11/19   Chelci Wintermute C, PA-C  promethazine (PHENERGAN) 25 MG tablet Take 1 tablet (25 mg total) by mouth every 6 (six) hours as needed for nausea or vomiting. 12/11/19   Saphronia Ozdemir C, PA-C  metoCLOPramide (REGLAN) 10 MG tablet Take 1 tablet (10 mg total) by mouth 3 (three) times daily before meals.  11/15/18 10/16/19  Wurst, Marye Round, PA-C  omeprazole (PRILOSEC) 20 MG capsule Take 1 capsule (20 mg total) by mouth daily. 11/20/19 12/11/19  Darr, Marguerita Beards, PA-C    Family History Family History  Problem Relation Age of Onset  . Hypertension Mother     Social History Social History   Tobacco Use  . Smoking status: Former Smoker    Packs/day: 0.00    Types: Cigarettes    Quit date: 10/08/2017    Years since quitting: 2.1  . Smokeless tobacco: Never Used  Substance Use Topics  . Alcohol use: Not Currently    Alcohol/week: 14.0 standard drinks    Types: 14 Standard drinks or equivalent per week    Comment: daily  . Drug use: Yes    Types: Marijuana    Comment: last use 09/06/19,      Allergies   Patient has no known allergies.   Review of Systems Review of Systems  Constitutional: Negative for fever.  Respiratory: Negative for shortness of breath.   Cardiovascular: Negative for chest pain.  Gastrointestinal: Positive for abdominal pain, nausea and vomiting. Negative for diarrhea.  Genitourinary: Positive for dysuria and vaginal discharge. Negative for flank pain, genital sores, hematuria, menstrual problem, vaginal bleeding and vaginal pain.  Musculoskeletal: Negative for back pain.  Skin: Negative for rash.  Neurological: Negative for dizziness, light-headedness and headaches.     Physical Exam Triage Vital Signs ED Triage Vitals  Enc Vitals Group     BP 12/11/19 1918 121/67     Pulse Rate 12/11/19 1918 99     Resp 12/11/19 1918 17     Temp 12/11/19 1918 98.4 F (36.9 C)     Temp Source 12/11/19 1918 Oral     SpO2 12/11/19 1918 98 %     Weight --      Height --      Head Circumference --      Peak Flow --      Pain Score 12/11/19 1916 8     Pain Loc --      Pain Edu? --      Excl. in [place]? --    No data found.  Updated Vital Signs BP 121/67 (BP Location: Right Arm)   Pulse 99   Temp 98.4 F (36.9 C) (Oral)   Resp 17   LMP  (Within Weeks) Comment: 3-4 weeks  SpO2 98%   Breastfeeding No   Visual Acuity Right Eye Distance:   Left Eye Distance:   Bilateral Distance:    Right Eye Near:   Left Eye Near:    Bilateral Near:     Physical Exam Vitals and nursing note reviewed.  Constitutional:      Appearance: She is well-developed.     Comments: No acute distress  HENT:     Head: Normocephalic and atraumatic.     Nose: Nose normal.  Eyes:     Conjunctiva/sclera: Conjunctivae normal.  Cardiovascular:     Rate and Rhythm: Normal rate.  Pulmonary:     Effort: Pulmonary effort is normal. No respiratory distress.     Comments: Breathing comfortably at rest, CTABL, no wheezing, rales or other adventitious sounds  auscultated  Abdominal:     General: There is no distension.     Comments: Abdomen soft, nondistended, generalized tenderness throughout entire abdomen, slightly more focal towards lower quadrants bilaterally, negative rebound, negative Rovsing, negative McBurney's, negative Murphy's  Musculoskeletal:        General: Normal range of  motion.     Cervical back: Neck supple.  Skin:    General: Skin is warm and dry.  Neurological:     Mental Status: She is alert and oriented to person, place, and time.      UC Treatments / Results  Labs (all labs ordered are listed, but only abnormal results are displayed) Labs Reviewed  POCT URINALYSIS DIP (DEVICE) - Abnormal; Notable for the following components:      Result Value   Bilirubin Urine MODERATE (*)    Ketones, ur 40 (*)    Hgb urine dipstick TRACE (*)    Protein, ur 100 (*)    Nitrite POSITIVE (*)    All other components within normal limits  URINE CULTURE  POC URINE PREG, ED    EKG   Radiology No results found.  Procedures Procedures (including critical care time)  Medications Ordered in UC Medications - No data to display  Initial Impression / Assessment and Plan / UC Course  I have reviewed the triage vital signs and the nursing notes.  Pertinent labs & imaging results that were available during my care of the patient were reviewed by me and considered in my medical decision making (see chart for details).     UA with positive nitrites, will empirically initiate treatment for UTI with Keflex twice daily x1 week.  Urine culture pending to confirm as check sensitivities.  Will alter treatment as needed.  Provided refill of Phenergan to use as needed for nausea.  Rest and fluids.  Naprosyn for pain/cramping.  Encourage patient to try to reschedule appointment with gastroenterology given frequent visits for cyclical vomiting.  Discussed strict return precautions. Patient verbalized understanding and is agreeable with  plan.  Final Clinical Impressions(s) / UC Diagnoses   Final diagnoses:  Lower abdominal pain  Nausea without vomiting     Discharge Instructions     Begin keflex for UTI, drink plenty of fluids Phenergan for nausea Naprosyn twice daily as needed for pain  Follow up if not improving or worsening   ED Prescriptions    Medication Sig Dispense Auth. Provider   promethazine (PHENERGAN) 25 MG tablet Take 1 tablet (25 mg total) by mouth every 6 (six) hours as needed for nausea or vomiting. 30 tablet Carlie Corpus C, PA-C   cephALEXin (KEFLEX) 500 MG capsule Take 1 capsule (500 mg total) by mouth 2 (two) times daily for 7 days. 14 capsule Laira Penninger C, PA-C   naproxen (NAPROSYN) 500 MG tablet Take 1 tablet (500 mg total) by mouth 2 (two) times daily. 30 tablet Ashutosh Dieguez, Edmund C, PA-C     PDMP not reviewed this encounter.   Ludene Stokke, [place] C, PA-C 12/12/19 1200

## 2019-12-13 LAB — URINE CULTURE

## 2020-01-25 ENCOUNTER — Encounter (HOSPITAL_COMMUNITY): Payer: Self-pay

## 2020-01-25 ENCOUNTER — Ambulatory Visit (HOSPITAL_COMMUNITY)
Admission: EM | Admit: 2020-01-25 | Discharge: 2020-01-25 | Disposition: A | Discharge status: Home / Self Care | Payer: Medicaid Other | Attending: Family Medicine | Admitting: Family Medicine

## 2020-01-25 ENCOUNTER — Other Ambulatory Visit: Payer: Self-pay

## 2020-01-25 DIAGNOSIS — R109 Unspecified abdominal pain: Secondary | ICD-10-CM

## 2020-01-25 DIAGNOSIS — R1115 Cyclical vomiting syndrome unrelated to migraine: Secondary | ICD-10-CM

## 2020-01-25 DIAGNOSIS — Z3202 Encounter for pregnancy test, result negative: Secondary | ICD-10-CM

## 2020-01-25 LAB — POC URINE PREG, ED: Preg Test, Ur: NEGATIVE

## 2020-01-25 MED ORDER — ONDANSETRON HCL 4 MG/2ML IJ SOLN
INTRAMUSCULAR | Status: AC
  Filled 2020-01-25: qty 4

## 2020-01-25 MED ORDER — ONDANSETRON HCL 4 MG/2ML IJ SOLN
8.0000 mg | Freq: Once | INTRAMUSCULAR | Status: AC
  Administered 2020-01-25: 8 mg via INTRAMUSCULAR

## 2020-01-25 NOTE — Discharge Instructions (Addendum)
Need to go to ER for higher level of testing and care

## 2020-01-25 NOTE — ED Provider Notes (Signed)
Patient registered to be seen in the emergency department She complains of abdominal pain that she states has been progressively worsening for several days Today she describes the pain is "severe" She took Phenergan for her nausea today, but is still actively vomiting  BP 128/74 (BP Location: Right Arm)   Pulse (!) 112   Temp 99.8 F (37.7 C) (Oral)   Resp 16   LMP  (LMP Unknown)   SpO2 100%   Pregnancy test is negative  Patient is observed walking down the hall.  She has been to the waist holding her abdomen.  She is grunting and moaning with each step stating her pain is severe.  When I went into the exam room to discuss with her her history, she was on her hands and knees on the exam table vomiting into a bag.  Still complaining of severe abdominal pain.  I advise her to go to the ER for care   Eustace Moore, MD 01/25/20 1800

## 2020-01-25 NOTE — ED Notes (Signed)
Pt offered assistance to go to the ER. Pt refused related to childcare.

## 2020-01-25 NOTE — ED Triage Notes (Signed)
Pt presents to UC for vomiting since 1400. Pt endorsing abdominal pain x1 week. Pt states she took promethazine at 1100 today with temporary relief.

## 2020-02-28 ENCOUNTER — Other Ambulatory Visit: Payer: Self-pay

## 2020-02-28 ENCOUNTER — Encounter (HOSPITAL_COMMUNITY): Payer: Self-pay

## 2020-02-28 ENCOUNTER — Ambulatory Visit (HOSPITAL_COMMUNITY)
Admission: RE | Admit: 2020-02-28 | Discharge: 2020-02-28 | Disposition: A | Discharge status: Home / Self Care | Payer: Self-pay | Source: Ambulatory Visit | Attending: Family Medicine | Admitting: Family Medicine

## 2020-02-28 VITALS — BP 126/47 | HR 98 | Temp 98.6°F | Resp 18

## 2020-02-28 DIAGNOSIS — Z202 Contact with and (suspected) exposure to infections with a predominantly sexual mode of transmission: Secondary | ICD-10-CM | POA: Insufficient documentation

## 2020-02-28 DIAGNOSIS — Z87891 Personal history of nicotine dependence: Secondary | ICD-10-CM | POA: Insufficient documentation

## 2020-02-28 DIAGNOSIS — Z79899 Other long term (current) drug therapy: Secondary | ICD-10-CM | POA: Insufficient documentation

## 2020-02-28 DIAGNOSIS — K5900 Constipation, unspecified: Secondary | ICD-10-CM | POA: Insufficient documentation

## 2020-02-28 DIAGNOSIS — R0981 Nasal congestion: Secondary | ICD-10-CM | POA: Insufficient documentation

## 2020-02-28 DIAGNOSIS — Z3202 Encounter for pregnancy test, result negative: Secondary | ICD-10-CM

## 2020-02-28 DIAGNOSIS — Z8249 Family history of ischemic heart disease and other diseases of the circulatory system: Secondary | ICD-10-CM | POA: Insufficient documentation

## 2020-02-28 DIAGNOSIS — R1110 Vomiting, unspecified: Secondary | ICD-10-CM

## 2020-02-28 DIAGNOSIS — Z20822 Contact with and (suspected) exposure to covid-19: Secondary | ICD-10-CM | POA: Insufficient documentation

## 2020-02-28 DIAGNOSIS — R112 Nausea with vomiting, unspecified: Secondary | ICD-10-CM | POA: Insufficient documentation

## 2020-02-28 DIAGNOSIS — R1030 Lower abdominal pain, unspecified: Secondary | ICD-10-CM | POA: Insufficient documentation

## 2020-02-28 DIAGNOSIS — N898 Other specified noninflammatory disorders of vagina: Secondary | ICD-10-CM | POA: Insufficient documentation

## 2020-02-28 DIAGNOSIS — Z711 Person with feared health complaint in whom no diagnosis is made: Secondary | ICD-10-CM

## 2020-02-28 LAB — POCT URINALYSIS DIPSTICK, ED / UC
Glucose, UA: NEGATIVE mg/dL
Ketones, ur: NEGATIVE mg/dL
Leukocytes,Ua: NEGATIVE
Nitrite: NEGATIVE
Protein, ur: 100 mg/dL — AB
Specific Gravity, Urine: >=1.03 (ref 1.005–1.030)
Urobilinogen, UA: 1 mg/dL (ref 0.0–1.0)
pH: 6 (ref 5.0–8.0)

## 2020-02-28 LAB — POC URINE PREG, ED: Preg Test, Ur: NEGATIVE

## 2020-02-28 MED ORDER — METRONIDAZOLE 500 MG PO TABS
500.0000 mg | ORAL_TABLET | Freq: Two times a day (BID) | ORAL | 0 refills | Status: AC
Start: 2020-02-28 — End: 2020-03-06

## 2020-02-28 MED ORDER — CEFTRIAXONE SODIUM 500 MG IJ SOLR
500.0000 mg | Freq: Once | INTRAMUSCULAR | Status: AC
  Administered 2020-02-28: 500 mg via INTRAMUSCULAR

## 2020-02-28 MED ORDER — CEFTRIAXONE SODIUM 500 MG IJ SOLR
INTRAMUSCULAR | Status: AC
  Filled 2020-02-28: qty 500

## 2020-02-28 MED ORDER — PROMETHAZINE HCL 25 MG PO TABS
25.0000 mg | ORAL_TABLET | Freq: Four times a day (QID) | ORAL | 0 refills | Status: DC | PRN
Start: 2020-02-28 — End: 2020-08-10

## 2020-02-28 MED ORDER — AZITHROMYCIN 250 MG PO TABS
ORAL_TABLET | ORAL | Status: AC
  Filled 2020-02-28: qty 4

## 2020-02-28 MED ORDER — AZITHROMYCIN 250 MG PO TABS
1000.0000 mg | ORAL_TABLET | Freq: Once | ORAL | Status: AC
  Administered 2020-02-28: 1000 mg via ORAL

## 2020-02-28 NOTE — Discharge Instructions (Signed)
We have treated you today for gonrorrhea and chlamydia, with rocephin and azithromycin. Please refrain from sexual intercourse for 7 days while medicines eliminating infection.   Urine with some mild dehydration, no sign of infection, drink plenty of fluids Phenergan for nausea  We are testing you for Gonorrhea, Chlamydia, Trichomonas, Yeast and Bacterial Vaginosis. We will call you if anything is positive and let you know if you require any further treatment. Please inform partners of any positive results.   Please return if symptoms not improving with treatment, development of fever, nausea, vomiting, abdominal pain.

## 2020-02-28 NOTE — ED Provider Notes (Signed)
[facility]    CSN: [id number] Arrival date & time: 02/28/20  1652      History   Chief Complaint Chief Complaint  Patient presents with  . Appointment    5:00pm  . Vomiting    HPI Angela Christian is a 24 y.o. female history of cyclical vomiting presenting today for evaluation of abdominal pain and vomiting.  Patient reports over the past week she has had lower abdominal pain as well as nausea and vomiting.  Reports symptoms feel slightly different from her typical abdominal pain/vomiting episodes.  Reports she will often feel the sensation for a few days at the beginning of her menstrual cycle, but the symptoms have persisted.  She is currently ending her menstrual cycle.  She is also concerned that she has had increase in vaginal discharge and irritation and does report a new partner recently.  She also reports alternating between having diarrhea constipation.  Has ran out of prior nausea medicines but she is perceived at previous visits.  Has not followed up with gastroenterology yet due to lack of insurance and missed her last visit due to being sick.  She has high suspicion of STD contributing to symptoms.   Also requesting Covid testing as she has had some nasal congestion, her son has recently been diagnosed with RSV.  Needing Covid test to return to work.  HPI  Past Medical History:  Diagnosis Date  . Anxiety   . Bipolar disease, manic (HCC)   . Cyclical vomiting syndrome   . Depression   . Schizophrenia [facility])     Patient Active Problem List   Diagnosis Date Noted  . Normal postpartum course 06/15/2018  . Indication for care in labor and delivery, antepartum 06/13/2018  . Cannabis abuse, continuous 04/02/2018  . Gastroesophageal reflux disease 04/02/2018  . [redacted] weeks gestation of pregnancy 04/02/2018  . Vomiting pregnancy 04/02/2018  . MDD (major depressive disorder) 09/10/2015  . Major depressive disorder, recurrent episode (HCC) 09/10/2015  .  Polysubstance abuse (HCC) 09/10/2015  . Unspecified mood (affective) disorder (HCC) 09/10/2015  . Secondary amenorrhea 02/27/2015  . Cyclical vomiting syndrome 02/10/2015    Past Surgical History:  Procedure Laterality Date  . TONSILLECTOMY      OB History    Gravida  1   Para  1   Term  1   Preterm  0   AB  0   Living  1     SAB  0   TAB  0   Ectopic  0   Multiple  0   Live Births  1            Home Medications    Prior to Admission medications   Medication Sig Start Date End Date Taking? Authorizing Provider  metroNIDAZOLE (FLAGYL) 500 MG tablet Take 1 tablet (500 mg total) by mouth 2 (two) times daily for 7 days. 02/28/20 03/06/20  Chip Canepa C, PA-C  promethazine (PHENERGAN) 25 MG tablet Take 1 tablet (25 mg total) by mouth every 6 (six) hours as needed for nausea or vomiting. 02/28/20   Benino Korinek C, PA-C  metoCLOPramide (REGLAN) 10 MG tablet Take 1 tablet (10 mg total) by mouth 3 (three) times daily before meals. 11/15/18 10/16/19  Wurst, Lowanda Foster, PA-C  omeprazole (PRILOSEC) 20 MG capsule Take 1 capsule (20 mg total) by mouth daily. 11/20/19 12/11/19  Darr, Veryl Speak, PA-C    Family History Family History  Problem Relation Age of Onset  .  Hypertension Mother     Social History Social History   Tobacco Use  . Smoking status: Former Smoker    Packs/day: 0.00    Types: Cigarettes    Quit date: 10/08/2017    Years since quitting: 2.3  . Smokeless tobacco: Never Used  Vaping Use  . Vaping Use: Never used  Substance Use Topics  . Alcohol use: Not Currently    Alcohol/week: 14.0 standard drinks    Types: 14 Standard drinks or equivalent per week    Comment: daily  . Drug use: Yes    Types: Marijuana    Comment: last use 09/06/19,     Allergies   Patient has no known allergies.   Review of Systems Review of Systems  Constitutional: Negative for fever.  Respiratory: Negative for shortness of breath.   Cardiovascular: Negative for  chest pain.  Gastrointestinal: Positive for abdominal pain, constipation, diarrhea and vomiting. Negative for nausea.  Genitourinary: Positive for vaginal discharge. Negative for dysuria, flank pain, genital sores, hematuria, menstrual problem, vaginal bleeding and vaginal pain.  Musculoskeletal: Negative for back pain.  Skin: Negative for rash.  Neurological: Negative for dizziness, light-headedness and headaches.     Physical Exam Triage Vital Signs ED Triage Vitals  Enc Vitals Group     BP 02/28/20 1740 (!) 126/47     Pulse Rate 02/28/20 1740 98     Resp 02/28/20 1740 18     Temp 02/28/20 1740 98.6 F (37 C)     Temp Source 02/28/20 1740 Oral     SpO2 02/28/20 1740 97 %     Weight --      Height --      Head Circumference --      Peak Flow --      Pain Score 02/28/20 1737 6     Pain Loc --      Pain Edu? --      Excl. in [place]? --    No data found.  Updated Vital Signs BP (!) 126/47 (BP Location: Left Arm)   Pulse 98   Temp 98.6 F (37 C) (Oral)   Resp 18   SpO2 97%   Visual Acuity Right Eye Distance:   Left Eye Distance:   Bilateral Distance:    Right Eye Near:   Left Eye Near:    Bilateral Near:     Physical Exam Vitals and nursing note reviewed.  Constitutional:      Appearance: She is well-developed.     Comments: No acute distress  HENT:     Head: Normocephalic and atraumatic.     Nose: Nose normal.     Mouth/Throat:     Comments: Oral mucosa pink and moist, no tonsillar enlargement or exudate. Posterior pharynx patent and nonerythematous, no uvula deviation or swelling. Normal phonation. Eyes:     Conjunctiva/sclera: Conjunctivae normal.  Cardiovascular:     Rate and Rhythm: Normal rate.  Pulmonary:     Effort: Pulmonary effort is normal. No respiratory distress.     Comments: Breathing comfortably at rest, CTABL, no wheezing, rales or other adventitious sounds auscultated  Abdominal:     General: There is no distension.     Comments: Soft,  nondistended, tender to palpation to bilateral lower quadrants, no focal tenderness, negative rebound, negative Rovsing, negative McBurney's  Musculoskeletal:        General: Normal range of motion.     Cervical back: Neck supple.  Skin:    General: Skin is warm  and dry.  Neurological:     Mental Status: She is alert and oriented to person, place, and time.      UC Treatments / Results  Labs (all labs ordered are listed, but only abnormal results are displayed) Labs Reviewed  POCT URINALYSIS DIPSTICK, ED / UC - Abnormal; Notable for the following components:      Result Value   Bilirubin Urine SMALL (*)    Hgb urine dipstick TRACE (*)    Protein, ur 100 (*)    All other components within normal limits  CERVICOVAGINAL ANCILLARY ONLY - Abnormal; Notable for the following components:   Trichomonas Positive (*)    Bacterial Vaginitis (gardnerella) Positive (*)    All other components within normal limits  SARS CORONAVIRUS 2 (TAT 6-24 HRS)  POC URINE PREG, ED    EKG   Radiology No results found.  Procedures Procedures (including critical care time)  Medications Ordered in UC Medications  cefTRIAXone (ROCEPHIN) injection 500 mg (500 mg Intramuscular Given 02/28/20 1821)  azithromycin (ZITHROMAX) tablet 1,000 mg (1,000 mg Oral Given 02/28/20 1821)    Initial Impression / Assessment and Plan / UC Course  I have reviewed the triage vital signs and the nursing notes.  Pertinent labs & imaging results that were available during my care of the patient were reviewed by me and considered in my medical decision making (see chart for details).     1.  STD screening-patient has high suspicion of STD, empirically treating for gonorrhea and chlamydia with Rocephin and azithromycin.  Metronidazole twice daily x1 week for BV/trich.  Vaginal swab pending.  Monitor for improvement of abdominal pain and nausea.  Also has history of cyclical vomiting, will refill Phenergan as this is been  the most effective antiemetic for her.  Continue to encourage her to follow-up with gastroenterology.   2.  Covid screening  Discussed strict return precautions. Patient verbalized understanding and is agreeable with plan.  Final Clinical Impressions(s) / UC Diagnoses   Final diagnoses:  Lower abdominal pain  Non-intractable vomiting with nausea, unspecified vomiting type  Concern about STD in female without diagnosis  Vaginal discharge     Discharge Instructions     We have treated you today for gonrorrhea and chlamydia, with rocephin and azithromycin. Please refrain from sexual intercourse for 7 days while medicines eliminating infection.   Urine with some mild dehydration, no sign of infection, drink plenty of fluids Phenergan for nausea  We are testing you for Gonorrhea, Chlamydia, Trichomonas, Yeast and Bacterial Vaginosis. We will call you if anything is positive and let you know if you require any further treatment. Please inform partners of any positive results.   Please return if symptoms not improving with treatment, development of fever, nausea, vomiting, abdominal pain.     ED Prescriptions    Medication Sig Dispense Auth. Provider   promethazine (PHENERGAN) 25 MG tablet Take 1 tablet (25 mg total) by mouth every 6 (six) hours as needed for nausea or vomiting. 30 tablet Annayah Worthley C, PA-C   metroNIDAZOLE (FLAGYL) 500 MG tablet Take 1 tablet (500 mg total) by mouth 2 (two) times daily for 7 days. 14 tablet Holden Maniscalco, [place] C, PA-C     PDMP not reviewed this encounter.   Lew Dawes, PA-C 03/01/20 1303

## 2020-02-28 NOTE — ED Triage Notes (Signed)
Vomiting and abdominal pain.  Vomiting intermittently for a week.  Patient has been having abdominal pain for 2 months, sharp.  Reports vaginal discharge.   Vaginal irritation

## 2020-02-29 LAB — CERVICOVAGINAL ANCILLARY ONLY
Bacterial Vaginitis (gardnerella): POSITIVE — AB
Candida Glabrata: NEGATIVE
Candida Vaginitis: NEGATIVE
Chlamydia: NEGATIVE
Comment: NEGATIVE
Comment: NEGATIVE
Comment: NEGATIVE
Comment: NEGATIVE
Comment: NEGATIVE
Comment: NORMAL
Neisseria Gonorrhea: NEGATIVE
Trichomonas: POSITIVE — AB

## 2020-02-29 LAB — SARS CORONAVIRUS 2 (TAT 6-24 HRS): SARS Coronavirus 2: NEGATIVE

## 2020-04-03 ENCOUNTER — Encounter (HOSPITAL_COMMUNITY): Payer: Self-pay

## 2020-04-03 ENCOUNTER — Ambulatory Visit (HOSPITAL_COMMUNITY)
Admission: RE | Admit: 2020-04-03 | Discharge: 2020-04-03 | Disposition: A | Discharge status: Home / Self Care | Payer: Self-pay | Source: Ambulatory Visit | Attending: Family Medicine | Admitting: Family Medicine

## 2020-04-03 ENCOUNTER — Other Ambulatory Visit: Payer: Self-pay

## 2020-04-03 VITALS — BP 117/62 | HR 74 | Temp 98.2°F | Resp 14

## 2020-04-03 DIAGNOSIS — Z20822 Contact with and (suspected) exposure to covid-19: Secondary | ICD-10-CM

## 2020-04-03 DIAGNOSIS — N941 Unspecified dyspareunia: Secondary | ICD-10-CM

## 2020-04-03 DIAGNOSIS — Z3202 Encounter for pregnancy test, result negative: Secondary | ICD-10-CM

## 2020-04-03 LAB — POCT URINALYSIS DIPSTICK, ED / UC
Bilirubin Urine: NEGATIVE
Glucose, UA: NEGATIVE mg/dL
Ketones, ur: NEGATIVE mg/dL
Leukocytes,Ua: NEGATIVE
Nitrite: NEGATIVE
Protein, ur: NEGATIVE mg/dL
Specific Gravity, Urine: <=1.005 (ref 1.005–1.030)
Urobilinogen, UA: 0.2 mg/dL (ref 0.0–1.0)
pH: 6 (ref 5.0–8.0)

## 2020-04-03 LAB — SARS CORONAVIRUS 2 (TAT 6-24 HRS): SARS Coronavirus 2: NEGATIVE

## 2020-04-03 LAB — POC URINE PREG, ED: Preg Test, Ur: NEGATIVE

## 2020-04-03 NOTE — ED Provider Notes (Signed)
[facility]    CSN: [id number] Arrival date & time: 04/03/20  1812      History   Chief Complaint Chief Complaint  Patient presents with  . Appointment  . SEXUALLY TRANSMITTED DISEASE  . covid test  . Possible Pregnancy    HPI Angela Christian is a 24 y.o. female.   Patient presenting today requesting a recheck STI panel to make sure she has cleared her recent trichomonal infection. Completed medication, no further discharge but states she does have occasional pain with intercourse. Also notes her period was a bit later than usual. Denies rashes, discharge, odor, abdominal pain, N/V/D. Also wanting to be tested for COVID as her partner had a positive exposure recently.      Past Medical History:  Diagnosis Date  . Anxiety   . Bipolar disease, manic (HCC)   . Cyclical vomiting syndrome   . Depression   . Schizophrenia [facility])     Patient Active Problem List   Diagnosis Date Noted  . Normal postpartum course 06/15/2018  . Indication for care in labor and delivery, antepartum 06/13/2018  . Cannabis abuse, continuous 04/02/2018  . Gastroesophageal reflux disease 04/02/2018  . [redacted] weeks gestation of pregnancy 04/02/2018  . Vomiting pregnancy 04/02/2018  . MDD (major depressive disorder) 09/10/2015  . Major depressive disorder, recurrent episode (HCC) 09/10/2015  . Polysubstance abuse (HCC) 09/10/2015  . Unspecified mood (affective) disorder (HCC) 09/10/2015  . Secondary amenorrhea 02/27/2015  . Cyclical vomiting syndrome 02/10/2015    Past Surgical History:  Procedure Laterality Date  . TONSILLECTOMY      OB History    Gravida  1   Para  1   Term  1   Preterm  0   AB  0   Living  1     SAB  0   TAB  0   Ectopic  0   Multiple  0   Live Births  1            Home Medications    Prior to Admission medications   Medication Sig Start Date End Date Taking? Authorizing Provider  promethazine (PHENERGAN) 25 MG tablet Take 1 tablet  (25 mg total) by mouth every 6 (six) hours as needed for nausea or vomiting. 02/28/20   Wieters, Hallie C, PA-C  metoCLOPramide (REGLAN) 10 MG tablet Take 1 tablet (10 mg total) by mouth 3 (three) times daily before meals. 11/15/18 10/16/19  Wurst, Lowanda Foster, PA-C  omeprazole (PRILOSEC) 20 MG capsule Take 1 capsule (20 mg total) by mouth daily. 11/20/19 12/11/19  Darr, Veryl Speak, PA-C    Family History Family History  Problem Relation Age of Onset  . Hypertension Mother     Social History Social History   Tobacco Use  . Smoking status: Former Smoker    Packs/day: 0.00    Types: Cigarettes    Quit date: 10/08/2017    Years since quitting: 2.4  . Smokeless tobacco: Never Used  Vaping Use  . Vaping Use: Never used  Substance Use Topics  . Alcohol use: Not Currently    Alcohol/week: 14.0 standard drinks    Types: 14 Standard drinks or equivalent per week    Comment: daily  . Drug use: Yes    Types: Marijuana    Comment: last use 09/06/19,     Allergies   Patient has no known allergies.   Review of Systems Review of Systems PER HPI    Physical Exam Triage Vital  Signs ED Triage Vitals  Enc Vitals Group     BP 04/03/20 1853 117/62     Pulse Rate 04/03/20 1853 74     Resp 04/03/20 1853 14     Temp 04/03/20 1853 98.2 F (36.8 C)     Temp Source 04/03/20 1853 Oral     SpO2 04/03/20 1853 100 %     Weight --      Height --      Head Circumference --      Peak Flow --      Pain Score 04/03/20 1843 0     Pain Loc --      Pain Edu? --      Excl. in GC? --    No data found.  Updated Vital Signs BP 117/62 (BP Location: Left Arm)   Pulse 74   Temp 98.2 F (36.8 C) (Oral)   Resp 14   LMP 03/23/2020   SpO2 100%   Visual Acuity Right Eye Distance:   Left Eye Distance:   Bilateral Distance:    Right Eye Near:   Left Eye Near:    Bilateral Near:     Physical Exam Vitals and nursing note reviewed.  Constitutional:      Appearance: Normal appearance. She is not  ill-appearing.  HENT:     Head: Atraumatic.     Right Ear: Tympanic membrane normal.     Left Ear: Tympanic membrane normal.     Nose: Nose normal.     Mouth/Throat:     Mouth: Mucous membranes are moist.     Pharynx: Oropharynx is clear.  Eyes:     Extraocular Movements: Extraocular movements intact.     Conjunctiva/sclera: Conjunctivae normal.  Cardiovascular:     Rate and Rhythm: Normal rate and regular rhythm.     Heart sounds: Normal heart sounds.  Pulmonary:     Effort: Pulmonary effort is normal.     Breath sounds: Normal breath sounds. No wheezing or rales.  Abdominal:     General: Bowel sounds are normal. There is no distension.     Palpations: Abdomen is soft.     Tenderness: There is no abdominal tenderness. There is no right CVA tenderness, left CVA tenderness or guarding.  Musculoskeletal:        General: Normal range of motion.     Cervical back: Normal range of motion and neck supple.  Skin:    General: Skin is warm and dry.  Neurological:     Mental Status: She is alert and oriented to person, place, and time.  Psychiatric:        Mood and Affect: Mood normal.        Thought Content: Thought content normal.        Judgment: Judgment normal.      UC Treatments / Results  Labs (all labs ordered are listed, but only abnormal results are displayed) Labs Reviewed  POCT URINALYSIS DIPSTICK, ED / UC - Abnormal; Notable for the following components:      Result Value   Hgb urine dipstick LARGE (*)    All other components within normal limits  SARS CORONAVIRUS 2 (TAT 6-24 HRS)  POC URINE PREG, ED  CERVICOVAGINAL ANCILLARY ONLY    EKG   Radiology No results found.  Procedures Procedures (including critical care time)  Medications Ordered in UC Medications - No data to display  Initial Impression / Assessment and Plan / UC Course  I have reviewed the triage  vital signs and the nursing notes.  Pertinent labs & imaging results that were available  during my care of the patient were reviewed by me and considered in my medical decision making (see chart for details).     U/A and urine preg negative today, aptima swab pending. Discussed if all negative and continuing to have dyspareunia, f/u with GYN for further evaluation.   Await COVID results, isolate until they return.    Final Clinical Impressions(s) / UC Diagnoses   Final diagnoses:  Dyspareunia in female  Exposure to COVID-19 virus   Discharge Instructions   None    ED Prescriptions    None     PDMP not reviewed this encounter.   Particia Nearing, [place] 04/03/20 2137

## 2020-04-03 NOTE — ED Triage Notes (Addendum)
Pt states she recently had intercourse and her vaginal area was irritated. She states her partner also found out he had covid exposure. Pt also states her period was about a week late and she would like a pregnancy test.

## 2020-04-04 ENCOUNTER — Telehealth (HOSPITAL_COMMUNITY): Payer: Self-pay | Admitting: Emergency Medicine

## 2020-04-04 LAB — CERVICOVAGINAL ANCILLARY ONLY
Bacterial Vaginitis (gardnerella): NEGATIVE
Candida Glabrata: NEGATIVE
Candida Vaginitis: POSITIVE — AB
Chlamydia: POSITIVE — AB
Comment: NEGATIVE
Comment: NEGATIVE
Comment: NEGATIVE
Comment: NEGATIVE
Comment: NEGATIVE
Comment: NORMAL
Neisseria Gonorrhea: NEGATIVE
Trichomonas: NEGATIVE

## 2020-04-04 MED ORDER — AZITHROMYCIN 250 MG PO TABS
1000.0000 mg | ORAL_TABLET | Freq: Once | ORAL | 0 refills | Status: AC
Start: 2020-04-04 — End: 2020-04-04

## 2020-04-04 MED ORDER — FLUCONAZOLE 150 MG PO TABS
150.0000 mg | ORAL_TABLET | Freq: Once | ORAL | 0 refills | Status: AC
Start: 2020-04-04 — End: 2020-04-04

## 2020-04-15 ENCOUNTER — Encounter (HOSPITAL_COMMUNITY): Payer: Self-pay

## 2020-04-15 ENCOUNTER — Other Ambulatory Visit: Payer: Self-pay

## 2020-04-15 ENCOUNTER — Ambulatory Visit (HOSPITAL_COMMUNITY)
Admission: RE | Admit: 2020-04-15 | Discharge: 2020-04-15 | Disposition: A | Discharge status: Home / Self Care | Payer: Self-pay | Source: Ambulatory Visit | Attending: Emergency Medicine | Admitting: Emergency Medicine

## 2020-04-15 VITALS — BP 122/75 | HR 98 | Temp 99.0°F | Resp 16

## 2020-04-15 DIAGNOSIS — N898 Other specified noninflammatory disorders of vagina: Secondary | ICD-10-CM

## 2020-04-15 DIAGNOSIS — N390 Urinary tract infection, site not specified: Secondary | ICD-10-CM | POA: Insufficient documentation

## 2020-04-15 DIAGNOSIS — N760 Acute vaginitis: Secondary | ICD-10-CM | POA: Insufficient documentation

## 2020-04-15 DIAGNOSIS — Z3202 Encounter for pregnancy test, result negative: Secondary | ICD-10-CM

## 2020-04-15 LAB — POCT URINALYSIS DIPSTICK, ED / UC
Bilirubin Urine: NEGATIVE
Glucose, UA: NEGATIVE mg/dL
Ketones, ur: NEGATIVE mg/dL
Nitrite: NEGATIVE
Protein, ur: 100 mg/dL — AB
Specific Gravity, Urine: >=1.03 (ref 1.005–1.030)
Urobilinogen, UA: 0.2 mg/dL (ref 0.0–1.0)
pH: 5.5 (ref 5.0–8.0)

## 2020-04-15 LAB — POC URINE PREG, ED: Preg Test, Ur: NEGATIVE

## 2020-04-15 MED ORDER — IBUPROFEN 600 MG PO TABS: 600.0000 mg | ORAL_TABLET | Freq: Four times a day (QID) | ORAL | 0 refills | Status: DC | PRN

## 2020-04-15 MED ORDER — ONDANSETRON 4 MG PO TBDP: 4.0000 mg | ORAL_TABLET | Freq: Three times a day (TID) | ORAL | 0 refills | Status: DC | PRN

## 2020-04-15 MED ORDER — CEPHALEXIN 500 MG PO CAPS: 500.0000 mg | ORAL_CAPSULE | Freq: Two times a day (BID) | ORAL | 0 refills | Status: AC

## 2020-04-15 NOTE — ED Provider Notes (Signed)
[facility]    CSN: [id number] Arrival date & time: 04/15/20  1542      History   Chief Complaint Chief Complaint  Patient presents with   Vaginal lrritation    HPI Angela Christian is a 24 y.o. female history of cyclical vomiting, presenting today for evaluation of vaginal irritation.  Patient recently was diagnosed with trichomonas and chlamydia, took the medicine for this, but did have some vomiting episodes associated.  She has continued to have some occasional abdominal pain and some occasional nausea.  Here to ensure clearance.  Feels very irritated in vaginal area.  Denies any notable discharge.  Does report urinary frequency.  Denies fevers.  HPI  Past Medical History:  Diagnosis Date   Anxiety    Bipolar disease, manic (HCC)    Cyclical vomiting syndrome    Depression    Schizophrenia [facility])     Patient Active Problem List   Diagnosis Date Noted   Normal postpartum course 06/15/2018   Indication for care in labor and delivery, antepartum 06/13/2018   Cannabis abuse, continuous 04/02/2018   Gastroesophageal reflux disease 04/02/2018   [redacted] weeks gestation of pregnancy 04/02/2018   Vomiting pregnancy 04/02/2018   MDD (major depressive disorder) 09/10/2015   Major depressive disorder, recurrent episode (HCC) 09/10/2015   Polysubstance abuse (HCC) 09/10/2015   Unspecified mood (affective) disorder (HCC) 09/10/2015   Secondary amenorrhea 02/27/2015   Cyclical vomiting syndrome 02/10/2015    Past Surgical History:  Procedure Laterality Date   TONSILLECTOMY      OB History    Gravida  1   Para  1   Term  1   Preterm  0   AB  0   Living  1     SAB  0   TAB  0   Ectopic  0   Multiple  0   Live Births  1            Home Medications    Prior to Admission medications   Medication Sig Start Date End Date Taking? Authorizing Provider  cephALEXin (KEFLEX) 500 MG capsule Take 1 capsule (500 mg total) by mouth 2  (two) times daily for 5 days. 04/15/20 04/20/20  Daman Steffenhagen C, PA-C  ibuprofen (ADVIL) 600 MG tablet Take 1 tablet (600 mg total) by mouth every 6 (six) hours as needed. 04/15/20   Elva Breaker C, PA-C  ondansetron (ZOFRAN ODT) 4 MG disintegrating tablet Take 1 tablet (4 mg total) by mouth every 8 (eight) hours as needed for nausea or vomiting. 04/15/20   Naima Veldhuizen C, PA-C  promethazine (PHENERGAN) 25 MG tablet Take 1 tablet (25 mg total) by mouth every 6 (six) hours as needed for nausea or vomiting. 02/28/20   Thailan Sava C, PA-C  metoCLOPramide (REGLAN) 10 MG tablet Take 1 tablet (10 mg total) by mouth 3 (three) times daily before meals. 11/15/18 10/16/19  Wurst, Lowanda Foster, PA-C  omeprazole (PRILOSEC) 20 MG capsule Take 1 capsule (20 mg total) by mouth daily. 11/20/19 12/11/19  Darr, Veryl Speak, PA-C    Family History Family History  Problem Relation Age of Onset   Hypertension Mother     Social History Social History   Tobacco Use   Smoking status: Former Smoker    Packs/day: 0.00    Types: Cigarettes    Quit date: 10/08/2017    Years since quitting: 2.5   Smokeless tobacco: Never Used  Vaping Use   Vaping Use: Never used  Substance Use Topics   Alcohol use: Not Currently    Alcohol/week: 14.0 standard drinks    Types: 14 Standard drinks or equivalent per week    Comment: daily   Drug use: Yes    Types: Marijuana    Comment: last use 09/06/19,     Allergies   Patient has no known allergies.   Review of Systems Review of Systems  Constitutional: Negative for fever.  Respiratory: Negative for shortness of breath.   Cardiovascular: Negative for chest pain.  Gastrointestinal: Positive for abdominal pain and nausea. Negative for diarrhea and vomiting.  Genitourinary: Positive for frequency. Negative for dysuria, flank pain, genital sores, hematuria, menstrual problem, vaginal bleeding, vaginal discharge and vaginal pain.  Musculoskeletal: Negative for back pain.    Skin: Negative for rash.  Neurological: Negative for dizziness, light-headedness and headaches.     Physical Exam Triage Vital Signs ED Triage Vitals  Enc Vitals Group     BP      Pulse      Resp      Temp      Temp src      SpO2      Weight      Height      Head Circumference      Peak Flow      Pain Score      Pain Loc      Pain Edu?      Excl. in GC?    No data found.  Updated Vital Signs BP 122/75 (BP Location: Left Arm)    Pulse 98    Temp 99 F (37.2 C) (Oral)    Resp 16    LMP 04/03/2020    SpO2 100%   Visual Acuity Right Eye Distance:   Left Eye Distance:   Bilateral Distance:    Right Eye Near:   Left Eye Near:    Bilateral Near:     Physical Exam Vitals and nursing note reviewed.  Constitutional:      Appearance: She is well-developed.     Comments: No acute distress  HENT:     Head: Normocephalic and atraumatic.     Nose: Nose normal.  Eyes:     Conjunctiva/sclera: Conjunctivae normal.  Cardiovascular:     Rate and Rhythm: Normal rate.  Pulmonary:     Effort: Pulmonary effort is normal. No respiratory distress.  Abdominal:     General: There is no distension.  Musculoskeletal:        General: Normal range of motion.     Cervical back: Neck supple.  Skin:    General: Skin is warm and dry.  Neurological:     Mental Status: She is alert and oriented to person, place, and time.      UC Treatments / Results  Labs (all labs ordered are listed, but only abnormal results are displayed) Labs Reviewed  POCT URINALYSIS DIPSTICK, ED / UC - Abnormal; Notable for the following components:      Result Value   Hgb urine dipstick MODERATE (*)    Protein, ur 100 (*)    Leukocytes,Ua LARGE (*)    All other components within normal limits  URINE CULTURE  POC URINE PREG, ED  CERVICOVAGINAL ANCILLARY ONLY    EKG   Radiology No results found.  Procedures Procedures (including critical care time)  Medications Ordered in UC Medications -  No data to display  Initial Impression / Assessment and Plan / UC Course  I have reviewed  the triage vital signs and the nursing notes.  Pertinent labs & imaging results that were available during my care of the patient were reviewed by me and considered in my medical decision making (see chart for details).     Pregnancy test negative, UA with large leuks and moderate hemoglobin, urine culture pending.  Empirically treating for UTI with Keflex, ibuprofen for pain as needed, Zofran for nausea.  Vaginal swab pending to further screen for vaginitis as well as confirm resolution of prior STDs.  Will call with results and provide treatment as needed.  Discussed strict return precautions. Patient verbalized understanding and is agreeable with plan.  Final Clinical Impressions(s) / UC Diagnoses   Final diagnoses:  Vaginitis and vulvovaginitis  Lower urinary tract infection, acute     Discharge Instructions     Vaginal swab pending to rescreen for STDs Urine was suggestive of UTI, I will send for culture to confirm Begin Keflex twice daily for 5 days Ibuprofen for pain Zofran for nausea  Follow-up if not improving or worsening    ED Prescriptions    Medication Sig Dispense Auth. Provider   ondansetron (ZOFRAN ODT) 4 MG disintegrating tablet Take 1 tablet (4 mg total) by mouth every 8 (eight) hours as needed for nausea or vomiting. 20 tablet Emric Kowalewski C, PA-C   ibuprofen (ADVIL) 600 MG tablet Take 1 tablet (600 mg total) by mouth every 6 (six) hours as needed. 30 tablet Kaliah Haddaway C, PA-C   cephALEXin (KEFLEX) 500 MG capsule Take 1 capsule (500 mg total) by mouth 2 (two) times daily for 5 days. 10 capsule Rajon Bisig, [place] C, PA-C     PDMP not reviewed this encounter.   Lew Dawes, PA-C 04/15/20 1726

## 2020-04-15 NOTE — Discharge Instructions (Signed)
Vaginal swab pending to rescreen for STDs Urine was suggestive of UTI, I will send for culture to confirm Begin Keflex twice daily for 5 days Ibuprofen for pain Zofran for nausea  Follow-up if not improving or worsening

## 2020-04-15 NOTE — ED Triage Notes (Signed)
Patient states she was seen here a week ago and was treated for trichomonas and chlamydia. Patient states that she completed course of antibiotics but did experience vomiting. Patient requesting to be rechecked to make sure that infections have cleared. Patient states that she had sexual intercourse of the weekend with the use of a condom but still has vaginal irritation. Patient also voices complaints of sharp abdominal pain when walking to room. Patient states she has been feeling more sleepy and having some nausea with waking up in the morning.

## 2020-04-16 LAB — CERVICOVAGINAL ANCILLARY ONLY
Bacterial Vaginitis (gardnerella): NEGATIVE
Candida Glabrata: NEGATIVE
Candida Vaginitis: POSITIVE — AB
Chlamydia: NEGATIVE
Comment: NEGATIVE
Comment: NEGATIVE
Comment: NEGATIVE
Comment: NEGATIVE
Comment: NEGATIVE
Comment: NORMAL
Neisseria Gonorrhea: NEGATIVE
Trichomonas: NEGATIVE

## 2020-04-16 LAB — URINE CULTURE

## 2020-04-17 ENCOUNTER — Telehealth (HOSPITAL_COMMUNITY): Payer: Self-pay | Admitting: Emergency Medicine

## 2020-04-17 MED ORDER — FLUCONAZOLE 150 MG PO TABS: 150.0000 mg | ORAL_TABLET | Freq: Once | ORAL | 0 refills | Status: AC

## 2020-05-09 ENCOUNTER — Inpatient Hospital Stay (HOSPITAL_COMMUNITY)
Admission: AD | Admit: 2020-05-09 | Discharge: 2020-05-09 | Discharge status: Against Medical Advice | Payer: Medicaid Other | Attending: Family Medicine | Admitting: Family Medicine

## 2020-05-09 ENCOUNTER — Encounter (HOSPITAL_COMMUNITY): Payer: Self-pay | Admitting: *Deleted

## 2020-05-09 DIAGNOSIS — Z3201 Encounter for pregnancy test, result positive: Secondary | ICD-10-CM

## 2020-05-09 DIAGNOSIS — R110 Nausea: Secondary | ICD-10-CM

## 2020-05-09 DIAGNOSIS — R109 Unspecified abdominal pain: Secondary | ICD-10-CM | POA: Diagnosis not present

## 2020-05-09 DIAGNOSIS — O26899 Other specified pregnancy related conditions, unspecified trimester: Secondary | ICD-10-CM | POA: Diagnosis not present

## 2020-05-09 DIAGNOSIS — Z5329 Procedure and treatment not carried out because of patient's decision for other reasons: Secondary | ICD-10-CM | POA: Diagnosis not present

## 2020-05-09 DIAGNOSIS — Z3A00 Weeks of gestation of pregnancy not specified: Secondary | ICD-10-CM | POA: Diagnosis not present

## 2020-05-09 DIAGNOSIS — Z3202 Encounter for pregnancy test, result negative: Secondary | ICD-10-CM | POA: Diagnosis present

## 2020-05-09 DIAGNOSIS — R112 Nausea with vomiting, unspecified: Secondary | ICD-10-CM | POA: Diagnosis not present

## 2020-05-09 DIAGNOSIS — O219 Vomiting of pregnancy, unspecified: Secondary | ICD-10-CM | POA: Diagnosis not present

## 2020-05-09 LAB — POCT PREGNANCY, URINE: Preg Test, Ur: POSITIVE — AB

## 2020-05-09 LAB — URINALYSIS, ROUTINE W REFLEX MICROSCOPIC
Bilirubin Urine: NEGATIVE
Glucose, UA: NEGATIVE mg/dL
Ketones, ur: NEGATIVE mg/dL
Nitrite: NEGATIVE
Protein, ur: NEGATIVE mg/dL
Specific Gravity, Urine: 1.011 (ref 1.005–1.030)
WBC, UA: >50 WBC/hpf — ABNORMAL HIGH (ref 0–5)
pH: 6 (ref 5.0–8.0)

## 2020-05-09 NOTE — MAU Note (Signed)
+  HPT  x2 this morning.  Took the test because she has been having "an allergic reaction to condoms,  Has hives below her left buttock."  First noted about 4 days, also having vaginal irritation. Had some real bad back pain and vomiting 4 days ago, no longer having either of those problems.  Also has pain at times in her thighs, ? Due to vomiting, "she has a vomiting issue".

## 2020-05-09 NOTE — MAU Provider Note (Signed)
First Provider Initiated Contact with Patient 05/09/20 1711     S [name] is a 24 y.o. G2P1001 patient who presents to MAU today with complaint of "sharp abdominal pain", thigh pain, recurrent nausea and vomiting in the setting of two positive home pregnancy tests. She endorses LMP of mid-September  Patient is accompanied by her toddler son Angela Christian and has a friend on the way to provide child care while she is being evaluated.  O BP 119/63 (BP Location: Right Arm)   Pulse 98   Temp 98.3 F (36.8 C) (Oral)   Resp 16   Ht 5\' 5"  (1.651 m)   Wt 46.4 kg   LMP 04/01/2020   SpO2 100%   BMI 17.02 kg/m    Physical Exam Vitals and nursing note reviewed. Exam conducted with a chaperone present.  Cardiovascular:     Pulses: Normal pulses.  Pulmonary:     Effort: Pulmonary effort is normal.  Abdominal:     General: Abdomen is flat.     Tenderness: There is no abdominal tenderness.  Skin:    General: Skin is warm and dry.     Capillary Refill: Capillary refill takes less than 2 seconds.  Neurological:     General: No focal deficit present.     Mental Status: She is alert.  Psychiatric:        Mood and Affect: Mood normal.        Behavior: Behavior normal.        Thought Content: Thought content normal.        Judgment: Judgment normal.    A Medical screening exam complete  Positive urine test in MAU Negative urine test 04/15/2020 at Urgent Care Patient evaluated in family counseling room due to presence of patient's toddler Patient agreeable to 04/17/2020 workup to rule out ectopic pregnancy  P Patient left AMA shortly after being screened by CNM and without accomplishing any of the labs or self swabs ordered  [place], CNM 05/09/2020 6:17 PM

## 2020-05-09 NOTE — MAU Note (Signed)
Pt states she needs to leave to take care of her baby and can't wait.  Signed AMA papers.  Provider notified.

## 2020-05-13 ENCOUNTER — Inpatient Hospital Stay (HOSPITAL_COMMUNITY)
Admission: AD | Admit: 2020-05-13 | Discharge: 2020-05-13 | Disposition: A | Discharge status: Home / Self Care | Payer: Medicaid Other | Attending: Obstetrics & Gynecology | Admitting: Obstetrics & Gynecology

## 2020-05-13 ENCOUNTER — Inpatient Hospital Stay (HOSPITAL_COMMUNITY): Payer: Medicaid Other

## 2020-05-13 ENCOUNTER — Other Ambulatory Visit: Payer: Self-pay

## 2020-05-13 DIAGNOSIS — O3680X0 Pregnancy with inconclusive fetal viability, not applicable or unspecified: Secondary | ICD-10-CM | POA: Diagnosis not present

## 2020-05-13 DIAGNOSIS — Z3A01 Less than 8 weeks gestation of pregnancy: Secondary | ICD-10-CM | POA: Insufficient documentation

## 2020-05-13 DIAGNOSIS — O26851 Spotting complicating pregnancy, first trimester: Secondary | ICD-10-CM | POA: Insufficient documentation

## 2020-05-13 DIAGNOSIS — Z6790 Unspecified blood type, Rh positive: Secondary | ICD-10-CM | POA: Diagnosis not present

## 2020-05-13 DIAGNOSIS — O4691 Antepartum hemorrhage, unspecified, first trimester: Secondary | ICD-10-CM

## 2020-05-13 DIAGNOSIS — Z87891 Personal history of nicotine dependence: Secondary | ICD-10-CM | POA: Diagnosis not present

## 2020-05-13 DIAGNOSIS — O4690 Antepartum hemorrhage, unspecified, unspecified trimester: Secondary | ICD-10-CM

## 2020-05-13 LAB — CBC
HCT: 42.6 % (ref 36.0–46.0)
Hemoglobin: 14.2 g/dL (ref 12.0–15.0)
MCH: 35.9 pg — ABNORMAL HIGH (ref 26.0–34.0)
MCHC: 33.3 g/dL (ref 30.0–36.0)
MCV: 107.8 fL — ABNORMAL HIGH (ref 80.0–100.0)
Platelets: 316 10*3/uL (ref 150–400)
RBC: 3.95 MIL/uL (ref 3.87–5.11)
RDW: 11.5 % (ref 11.5–15.5)
WBC: 7.1 10*3/uL (ref 4.0–10.5)
nRBC: 0 % (ref 0.0–0.2)

## 2020-05-13 LAB — WET PREP, GENITAL
Clue Cells Wet Prep HPF POC: NONE SEEN
Sperm: NONE SEEN
Trich, Wet Prep: NONE SEEN
Yeast Wet Prep HPF POC: NONE SEEN

## 2020-05-13 LAB — HCG, QUANTITATIVE, PREGNANCY: hCG, Beta Chain, Quant, S: 3321 m[IU]/mL — ABNORMAL HIGH (ref <5)

## 2020-05-13 NOTE — MAU Note (Signed)
Pt reports she stared having some dark brown spotting this morning when she went to the [facility]. No pian today but has been having cramping on and off for a few days. Came to MAU a few days ago but unable to stay for evaluation.

## 2020-05-13 NOTE — MAU Provider Note (Signed)
History     CSN: [id number]  Arrival date and time: 05/13/20 1144   First Provider Initiated Contact with Patient 05/13/20 1318      Chief Complaint  Patient presents with  . Vaginal Bleeding   24 y.o. G2P1001 @[redacted]w[redacted]d  by unsure LMP presenting with spotting. Reports onset a few days ago and again today. Describes as brown color when she wipes. Last IC 2 days ago. Had some cramping a few days ago, none now. She was treated for Chlamydia and Trichamonas.   OB History    Gravida  2   Para  1   Term  1   Preterm  0   AB  0   Living  1     SAB  0   TAB  0   Ectopic  0   Multiple  0   Live Births  1           Past Medical History:  Diagnosis Date  . Anxiety   . Bipolar disease, manic (HCC)   . Cyclical vomiting syndrome   . Depression   . Schizophrenia [facility])     Past Surgical History:  Procedure Laterality Date  . TONSILLECTOMY      Family History  Problem Relation Age of Onset  . Hypertension Mother     Social History   Tobacco Use  . Smoking status: Former Smoker    Packs/day: 0.00    Types: Cigarettes    Quit date: 10/08/2017    Years since quitting: 2.5  . Smokeless tobacco: Never Used  Vaping Use  . Vaping Use: Never used  Substance Use Topics  . Alcohol use: Not Currently    Alcohol/week: 14.0 standard drinks    Types: 14 Standard drinks or equivalent per week    Comment: daily  . Drug use: Yes    Types: Marijuana    Comment: last use 09/06/19,    Allergies: No Known Allergies  Medications Prior to Admission  Medication Sig Dispense Refill Last Dose  . ibuprofen (ADVIL) 600 MG tablet Take 1 tablet (600 mg total) by mouth every 6 (six) hours as needed. 30 tablet 0   . ondansetron (ZOFRAN ODT) 4 MG disintegrating tablet Take 1 tablet (4 mg total) by mouth every 8 (eight) hours as needed for nausea or vomiting. 20 tablet 0   . promethazine (PHENERGAN) 25 MG tablet Take 1 tablet (25 mg total) by mouth every 6 (six) hours as needed for  nausea or vomiting. 30 tablet 0     Review of Systems  Gastrointestinal: Negative for abdominal pain.  Genitourinary: Positive for vaginal bleeding.   Physical Exam   Blood pressure 115/70, pulse 88, temperature 98.8 F (37.1 C), resp. rate 18, height 5\' 5"  (1.651 m), weight 47.6 kg, last menstrual period 04/01/2020, not currently breastfeeding.  Physical Exam Vitals and nursing note reviewed. Exam conducted with a chaperone present.  Constitutional:      General: She is not in acute distress.    Appearance: Normal appearance.  HENT:     Head: Normocephalic and atraumatic.  Cardiovascular:     Rate and Rhythm: Normal rate.  Pulmonary:     Effort: Pulmonary effort is normal. No respiratory distress.  Abdominal:     General: There is no distension.     Palpations: Abdomen is soft. There is no mass.     Tenderness: There is no abdominal tenderness. There is no guarding or rebound.  Genitourinary:    Comments: deferred Musculoskeletal:  General: Normal range of motion.     Cervical back: Normal range of motion.  Skin:    General: Skin is warm.  Neurological:     General: No focal deficit present.     Mental Status: She is alert and oriented to person, place, and time.  Psychiatric:        Mood and Affect: Mood normal.        Behavior: Behavior normal.    Results for orders placed or performed during the hospital encounter of 05/13/20 (from the past 24 hour(s))  CBC     Status: Abnormal   Collection Time: 05/13/20 12:26 PM  Result Value Ref Range   WBC 7.1 4.0 - 10.5 K/uL   RBC 3.95 3.87 - 5.11 MIL/uL   Hemoglobin 14.2 12.0 - 15.0 g/dL   HCT 14.7 36 - 46 %   MCV 107.8 (H) 80.0 - 100.0 fL   MCH 35.9 (H) 26.0 - 34.0 pg   MCHC 33.3 30.0 - 36.0 g/dL   RDW 82.9 56.2 - 13.0 %   Platelets 316 150 - 400 K/uL   nRBC 0.0 0.0 - 0.2 %  hCG, quantitative, pregnancy     Status: Abnormal   Collection Time: 05/13/20 12:26 PM  Result Value Ref Range   hCG, Beta Chain,  Quant, S 3,321 (H) <5 mIU/mL  Wet prep, genital     Status: Abnormal   Collection Time: 05/13/20  1:45 PM   Specimen: PATH Cytology Cervicovaginal Ancillary Only  Result Value Ref Range   Yeast Wet Prep HPF POC NONE SEEN NONE SEEN   Trich, Wet Prep NONE SEEN NONE SEEN   Clue Cells Wet Prep HPF POC NONE SEEN NONE SEEN   WBC, Wet Prep HPF POC FEW (A) NONE SEEN   Sperm NONE SEEN    US OB LESS THAN 14 WEEKS WITH OB TRANSVAGINAL  Result Date: 05/13/2020 CLINICAL DATA:  Vaginal bleeding with positive beta HCG EXAM: OBSTETRIC <14 WK [place] AND TRANSVAGINAL OB US TECHNIQUE: Both transabdominal and transvaginal ultrasound examinations were performed for complete evaluation of the gestation as well as the maternal uterus, adnexal regions, and pelvic cul-de-sac. Transvaginal technique was performed to assess early pregnancy. COMPARISON:  None. FINDINGS: Intrauterine gestational sac: Not visualized Yolk sac:  Not visualized Embryo:  Not visualized Cardiac Activity: Not visualized Subchorionic hemorrhage:  None visualized. Maternal uterus/adnexae: Cervical os is closed. The endometrium measures 14 mm in thickness with a smooth contour. Mild inhomogeneity is noted within the endometrium, potentially representing focus of hemorrhage. Right ovary measures 2.4 x 2.6 x 1.4 cm. Left ovary measures 2.2 x 1.7 x 2.1 cm. No extrauterine pelvic mass or fluid. IMPRESSION: 1. No intrauterine gestation apparent currently. Given positive beta HCG value, differential considerations include intrauterine gestation too early to be seen by either transabdominal transvaginal technique; recent spontaneous abortion; ectopic gestation. Close clinical and laboratory surveillance in this regard advised. Timing of repeat ultrasound in large part will depend on beta HCG values going forward. 2. Inhomogeneity within the endometrium potentially may represent foci of hemorrhage. 3.  No extrauterine pelvic mass or fluid. Electronically Signed   By:  Bretta Bang III M.D.   On: 05/13/2020 14:40   MAU Course  Procedures  MDM Labs and [place] ordered and reviewed. No IUGS, YS or FP seen on [place], findings could indicate early pregnancy, ectopic pregnancy, or failed pregnancy, discussed with pt. Will follow quant in 48 hrs. Stable for discharge home.   Assessment and Plan  1. Pregnancy, location unknown   2. Vaginal bleeding in pregnancy   3. Blood type, Rh positive    Discharge home Follow up at [facility] on 05/15/20 for stat qhcg Ectopic/SAB precautions  Allergies as of 05/13/2020   No Known Allergies     Medication List    STOP taking these medications   ibuprofen 600 MG tablet Commonly known as: ADVIL     TAKE these medications   ondansetron 4 MG disintegrating tablet Commonly known as: Zofran ODT Take 1 tablet (4 mg total) by mouth every 8 (eight) hours as needed for nausea or vomiting.   promethazine 25 MG tablet Commonly known as: PHENERGAN Take 1 tablet (25 mg total) by mouth every 6 (six) hours as needed for nausea or vomiting.      Donette Larry, CNM 05/13/2020, 1:25 PM

## 2020-05-13 NOTE — Discharge Instructions (Signed)

## 2020-05-14 LAB — GC/CHLAMYDIA PROBE AMP (~~LOC~~) NOT AT ARMC
Chlamydia: POSITIVE — AB
Comment: NEGATIVE
Comment: NORMAL
Neisseria Gonorrhea: NEGATIVE

## 2020-05-15 ENCOUNTER — Ambulatory Visit: Payer: Medicaid Other

## 2020-05-15 ENCOUNTER — Encounter (HOSPITAL_COMMUNITY): Payer: Self-pay | Admitting: Obstetrics & Gynecology

## 2020-05-15 ENCOUNTER — Other Ambulatory Visit: Payer: Self-pay

## 2020-05-15 ENCOUNTER — Inpatient Hospital Stay (HOSPITAL_COMMUNITY)
Admission: AD | Admit: 2020-05-15 | Discharge: 2020-05-15 | Disposition: A | Discharge status: Home / Self Care | Payer: Medicaid Other | Attending: Obstetrics & Gynecology | Admitting: Obstetrics & Gynecology

## 2020-05-15 DIAGNOSIS — Z3A01 Less than 8 weeks gestation of pregnancy: Secondary | ICD-10-CM | POA: Insufficient documentation

## 2020-05-15 DIAGNOSIS — O200 Threatened abortion: Secondary | ICD-10-CM

## 2020-05-15 DIAGNOSIS — O99341 Other mental disorders complicating pregnancy, first trimester: Secondary | ICD-10-CM | POA: Diagnosis not present

## 2020-05-15 DIAGNOSIS — O98311 Other infections with a predominantly sexual mode of transmission complicating pregnancy, first trimester: Secondary | ICD-10-CM

## 2020-05-15 DIAGNOSIS — A749 Chlamydial infection, unspecified: Secondary | ICD-10-CM

## 2020-05-15 DIAGNOSIS — F319 Bipolar disorder, unspecified: Secondary | ICD-10-CM | POA: Insufficient documentation

## 2020-05-15 DIAGNOSIS — Z87891 Personal history of nicotine dependence: Secondary | ICD-10-CM | POA: Insufficient documentation

## 2020-05-15 DIAGNOSIS — F209 Schizophrenia, unspecified: Secondary | ICD-10-CM | POA: Diagnosis not present

## 2020-05-15 DIAGNOSIS — O209 Hemorrhage in early pregnancy, unspecified: Secondary | ICD-10-CM | POA: Insufficient documentation

## 2020-05-15 DIAGNOSIS — Z79899 Other long term (current) drug therapy: Secondary | ICD-10-CM | POA: Diagnosis not present

## 2020-05-15 DIAGNOSIS — O3680X0 Pregnancy with inconclusive fetal viability, not applicable or unspecified: Secondary | ICD-10-CM

## 2020-05-15 DIAGNOSIS — A568 Sexually transmitted chlamydial infection of other sites: Secondary | ICD-10-CM | POA: Diagnosis not present

## 2020-05-15 DIAGNOSIS — O4691 Antepartum hemorrhage, unspecified, first trimester: Secondary | ICD-10-CM | POA: Diagnosis not present

## 2020-05-15 LAB — HCG, QUANTITATIVE, PREGNANCY: hCG, Beta Chain, Quant, S: 3692 m[IU]/mL — ABNORMAL HIGH (ref <5)

## 2020-05-15 LAB — URINALYSIS, ROUTINE W REFLEX MICROSCOPIC
Bilirubin Urine: NEGATIVE
Glucose, UA: NEGATIVE mg/dL
Hgb urine dipstick: NEGATIVE
Ketones, ur: 5 mg/dL — AB
Leukocytes,Ua: NEGATIVE
Nitrite: NEGATIVE
Protein, ur: NEGATIVE mg/dL
Specific Gravity, Urine: 1.023 (ref 1.005–1.030)
pH: 6 (ref 5.0–8.0)

## 2020-05-15 MED ORDER — PROMETHAZINE HCL 25 MG PO TABS
25.0000 mg | ORAL_TABLET | Freq: Once | ORAL | Status: AC
  Administered 2020-05-15: 25 mg via ORAL
  Filled 2020-05-15: qty 1

## 2020-05-15 MED ORDER — AZITHROMYCIN 250 MG PO TABS
1000.0000 mg | ORAL_TABLET | Freq: Once | ORAL | Status: AC
  Administered 2020-05-15: 1000 mg via ORAL
  Filled 2020-05-15: qty 4

## 2020-05-15 NOTE — MAU Note (Signed)
Was seen here on Tues with brown spotting. Happened again on Weds when I wiped. This morning when I wiped is saw pink on tissue. Some cramping in lower back and hips

## 2020-05-15 NOTE — Discharge Instructions (Signed)
Threatened Miscarriage  A threatened miscarriage is when you have bleeding from your vagina during the first 20 weeks of pregnancy but the pregnancy does not end. Your doctor will do tests to make sure you are still pregnant. The cause of the bleeding may not be known. This condition does not mean your pregnancy will end, but it does increase the risk that it will end (complete miscarriage). Follow these instructions at home:  Get plenty of rest.  If you have bleeding in your vagina, do not have sex or use tampons.  Do not douche.  Do not smoke or use drugs.  Do not drink alcohol.  Avoid caffeine.  Keep all follow-up prenatal visits as told by your doctor. This is important. Contact a doctor if:  You have light bleeding from your vagina.  You have belly pain or cramping.  You have a fever. Get help right away if:  You have heavy bleeding from your vagina.  You have clots of blood coming from your vagina.  You pass tissue from your vagina.  You have a gush of fluid from your vagina.  You are leaking fluid from your vagina.  You have very bad pain or cramps in your low back or belly.  You have fever, chills, and very bad belly pain. Summary  A threatened miscarriage is when you have bleeding from your vagina during the first 20 weeks of pregnancy but the pregnancy does not end.  This condition does not mean your pregnancy will end, but it does increase the risk that it will end (complete miscarriage).  Get plenty of rest. If you have bleeding in your vagina, do not have sex or use tampons.  Keep all follow-up prenatal visits as told by your doctor. This is important. This information is not intended to replace advice given to you by your health care provider. Make sure you discuss any questions you have with your health care provider. Document Revised: 08/11/2017 Document Reviewed: 10/01/2016 Elsevier Patient Education  2020 Elsevier Inc.  

## 2020-05-15 NOTE — MAU Provider Note (Addendum)
Chief Complaint: Vaginal Bleeding   First Provider Initiated Contact with Patient 05/15/20 0422        SUBJECTIVE HPI: Angela Christian is a 24 y.o. G2P1001 at [redacted]w[redacted]d by LMP who presents to maternity admissions reporting vaginal spotting which turned from brown to pink,  Was scheduled to have her HCG done this am in office at 9.  Last HCG was 3321, nothing seen on [place]. She denies vaginal itching/burning, urinary symptoms, h/a, dizziness, n/v, or fever/chills.    Vaginal Bleeding The patient's primary symptoms include vaginal bleeding. The patient's pertinent negatives include no genital itching, genital lesions, genital odor or pelvic pain. This is a recurrent problem. The current episode started in the past 7 days. The problem occurs intermittently. The problem has been unchanged. The patient is experiencing no pain. She is pregnant. Pertinent negatives include no back pain, constipation, diarrhea, fever, headaches, nausea or vomiting. The vaginal discharge was bloody. The vaginal bleeding is spotting. She has not been passing clots. She has not been passing tissue. Nothing aggravates the symptoms. She has tried nothing for the symptoms. She is sexually active.   RN Note Was seen here on Tues with brown spotting. Happened again on Weds when I wiped. This morning when I wiped is saw pink on tissue. Some cramping in lower back and hips   Past Medical History:  Diagnosis Date  . Anxiety   . Bipolar disease, manic (HCC)   . Cyclical vomiting syndrome   . Depression   . Schizophrenia [facility])    Past Surgical History:  Procedure Laterality Date  . TONSILLECTOMY     Social History   Socioeconomic History  . Marital status: Single    Spouse name: Not on file  . Number of children: Not on file  . Years of education: Not on file  . Highest education level: Not on file  Occupational History  . Not on file  Tobacco Use  . Smoking status: Former Smoker    Packs/day: 0.00    Types:  Cigarettes    Quit date: 10/08/2017    Years since quitting: 2.6  . Smokeless tobacco: Never Used  Vaping Use  . Vaping Use: Never used  Substance and Sexual Activity  . Alcohol use: Not Currently    Alcohol/week: 14.0 standard drinks    Types: 14 Standard drinks or equivalent per week    Comment: daily  . Drug use: Yes    Types: Marijuana    Comment: last use 09/06/19,  . Sexual activity: Yes    Birth control/protection: None  Other Topics Concern  . Not on file  Social History Narrative  . Not on file   Social Determinants of Health   Financial Resource Strain:   . Difficulty of Paying Living Expenses: Not on file  Food Insecurity:   . Worried About Programme researcher, broadcasting/film/video in the Last Year: Not on file  . Ran Out of Food in the Last Year: Not on file  Transportation Needs:   . Lack of Transportation (Medical): Not on file  . Lack of Transportation (Non-Medical): Not on file  Physical Activity:   . Days of Exercise per Week: Not on file  . Minutes of Exercise per Session: Not on file  Stress:   . Feeling of Stress : Not on file  Social Connections:   . Frequency of Communication with Friends and Family: Not on file  . Frequency of Social Gatherings with Friends and Family: Not on file  .  Attends Religious Services: Not on file  . Active Member of Clubs or Organizations: Not on file  . Attends Banker Meetings: Not on file  . Marital Status: Not on file  Intimate Partner Violence:   . Fear of Current or Ex-Partner: Not on file  . Emotionally Abused: Not on file  . Physically Abused: Not on file  . Sexually Abused: Not on file   No current facility-administered medications on file prior to encounter.   Current Outpatient Medications on File Prior to Encounter  Medication Sig Dispense Refill  . ondansetron (ZOFRAN ODT) 4 MG disintegrating tablet Take 1 tablet (4 mg total) by mouth every 8 (eight) hours as needed for nausea or vomiting. 20 tablet 0  .  promethazine (PHENERGAN) 25 MG tablet Take 1 tablet (25 mg total) by mouth every 6 (six) hours as needed for nausea or vomiting. 30 tablet 0  . [DISCONTINUED] metoCLOPramide (REGLAN) 10 MG tablet Take 1 tablet (10 mg total) by mouth 3 (three) times daily before meals. 15 tablet 0  . [DISCONTINUED] omeprazole (PRILOSEC) 20 MG capsule Take 1 capsule (20 mg total) by mouth daily. 30 capsule 0   No Known Allergies  I have reviewed patient's Past Medical Hx, Surgical Hx, Family Hx, Social Hx, medications and allergies.   ROS:  Review of Systems  Constitutional: Negative for fever.  Gastrointestinal: Negative for constipation, diarrhea, nausea and vomiting.  Genitourinary: Positive for vaginal bleeding. Negative for pelvic pain.  Musculoskeletal: Negative for back pain.  Neurological: Negative for headaches.   Review of Systems  Other systems negative   Physical Exam  Physical Exam Patient Vitals for the past 24 hrs:  BP Temp Pulse Resp Height Weight  05/15/20 0355 (!) 121/50 98.4 F (36.9 C) 80 16 5\' 5"  (1.651 m) 49.9 kg   Constitutional: Well-developed, well-nourished female in no acute distress.  Cardiovascular: normal rate Respiratory: normal effort GI: Abd soft, non-tender. Pos BS x 4 MS: Extremities nontender, no edema, normal ROM Neurologic: Alert and oriented x 4.  GU: Neg CVAT.  PELVIC EXAM: scant pink discharge.  No hemorrhage.  Abdomen nontender                           Bimanual and speculum exam deferred   LAB RESULTS Results for orders placed or performed during the hospital encounter of 05/15/20 (from the past 24 hour(s))  hCG, quantitative, pregnancy     Status: Abnormal   Collection Time: 05/15/20  3:34 AM  Result Value Ref Range   hCG, Beta Chain, Quant, S 3,692 (H) <5 mIU/mL  Urinalysis, Routine w reflex microscopic Urine, Clean Catch     Status: Abnormal   Collection Time: 05/15/20  4:33 AM  Result Value Ref Range   Color, Urine YELLOW YELLOW   APPearance  HAZY (A) CLEAR   Specific Gravity, Urine 1.023 1.005 - 1.030   pH 6.0 5.0 - 8.0   Glucose, UA NEGATIVE NEGATIVE mg/dL   Hgb urine dipstick NEGATIVE NEGATIVE   Bilirubin Urine NEGATIVE NEGATIVE   Ketones, ur 5 (A) NEGATIVE mg/dL   Protein, ur NEGATIVE NEGATIVE mg/dL   Nitrite NEGATIVE NEGATIVE   Leukocytes,Ua NEGATIVE NEGATIVE    IMAGING   MAU Management/MDM: Ordered repeat HCG.  Discussed this has not risen much from before. Discussed this may mean SAB or possibly ectopic.  Dr 05/17/20 recommends one more HCG on Saturday.  This bleeding/pain can represent a normal pregnancy with bleeding,  spontaneous abortion or even an ectopic which can be life-threatening.  The process as listed above helps to determine which of these is present.  Reviewed last culture is positive for Chlamydia.  Patient very upset because she was treated for it in mid-Sept.  Had test of cure on 9/28 that was negative.  So this is a new reinfection.   ASSESSMENT Pregnancy at [redacted]w[redacted]d Vaginal spotting Threatened SAB vs Ectopic pregnancy  Chlamydia  PLAN Discharge home Treated with Phenergan and Zithromax tonight Expedited partner treatment Rx given Plan to repeat HCG level in 48 hours in MAU  Per Dr Macon Large   Will repeat  Ultrasound in about 7-10 days if HCG levels double appropriately, though doubtful this will happen.   Ectopic precautions  Pt stable at time of discharge. Encouraged to return here or to other Urgent Care/ED if she develops worsening of symptoms, increase in pain, fever, or other concerning symptoms.    Wynelle Bourgeois CNM, MSN Certified Nurse-Midwife 05/15/2020  4:23 AM

## 2020-05-17 ENCOUNTER — Other Ambulatory Visit: Payer: Self-pay

## 2020-05-17 ENCOUNTER — Inpatient Hospital Stay (HOSPITAL_COMMUNITY)
Admission: AD | Admit: 2020-05-17 | Discharge: 2020-05-17 | Disposition: A | Discharge status: Home / Self Care | Payer: Medicaid Other | Attending: Obstetrics & Gynecology | Admitting: Obstetrics & Gynecology

## 2020-05-17 DIAGNOSIS — Z3A01 Less than 8 weeks gestation of pregnancy: Secondary | ICD-10-CM | POA: Diagnosis not present

## 2020-05-17 DIAGNOSIS — E349 Endocrine disorder, unspecified: Secondary | ICD-10-CM | POA: Diagnosis not present

## 2020-05-17 DIAGNOSIS — O3680X0 Pregnancy with inconclusive fetal viability, not applicable or unspecified: Secondary | ICD-10-CM | POA: Diagnosis present

## 2020-05-17 DIAGNOSIS — Z3201 Encounter for pregnancy test, result positive: Secondary | ICD-10-CM | POA: Insufficient documentation

## 2020-05-17 LAB — HCG, QUANTITATIVE, PREGNANCY: hCG, Beta Chain, Quant, S: 7031 m[IU]/mL — ABNORMAL HIGH (ref <5)

## 2020-05-17 MED ORDER — PRENATAL 27-0.8 MG PO TABS: 1.0000 | ORAL_TABLET | Freq: Every day | ORAL | 11 refills | Status: DC

## 2020-05-17 NOTE — MAU Note (Signed)
Angela Christian is a 24 y.o. at [redacted]w[redacted]d here in MAU reporting: here for follow up hcg. Denies pain. States sometimes she has brown spotting but none currently.  Onset of complaint: ongoing  Pain score: 0/10  Vitals:   05/17/20 0935  BP: (!) 121/50  Pulse: 91  Resp: 16  Temp: 98.7 F (37.1 C)  SpO2: 100%     Lab orders placed from triage: hcg

## 2020-05-17 NOTE — MAU Provider Note (Signed)
History   Chief Complaint:  Follow-up   Angela Christian is  24 y.o. G2P1001 Patient's last menstrual period was 04/01/2020.Marland Kitchen Patient is here for follow up of quantitative HCG and ongoing surveillance of pregnancy status. She is [redacted]w[redacted]d weeks gestation  by LMP.    Since her last visit, the patient is without new complaint. The patient reports bleeding as  none now.  She denies any pain.  General ROS:  negative  Her previous Quantitative HCG values are:  Results for Angela Christian (MRN [id number]) as of 05/17/2020 11:03  Ref. Range 05/13/2020 12:26 05/15/2020 03:34  HCG, Beta Chain, Quant, S Latest Ref Range: <5 mIU/mL 3,321 (H) 3,692 (H)   Physical Exam   Blood pressure (!) 121/50, pulse 91, temperature 98.7 F (37.1 C), temperature source Oral, resp. rate 16, last menstrual period 04/01/2020, SpO2 100 %, not currently breastfeeding.  Focused Gynecological Exam: examination not indicated  Labs: Results for orders placed or performed during the hospital encounter of 05/17/20 (from the past 24 hour(s))  hCG, quantitative, pregnancy   Collection Time: 05/17/20  9:40 AM  Result Value Ref Range   hCG, Beta Chain, Quant, S 7,031 (H) <5 mIU/mL   Assessment:   1. Elevated serum hCG   2. [redacted] weeks gestation of pregnancy     >60% rise in HCG over 48 hours. Patient does not have any pain or bleeding and this is a desired pregnancy. Will repeat ultrasound in 10 days. Strict ectopic precautions reviewed.   Plan: -Discharge home in stable condition -Ectopic precautions discussed -Patient advised to follow-up with [facility] for repeat ultrasound, order placed -Patient may return to MAU as needed or if her condition were to change or worsen  Rolm Bookbinder, CNM 05/17/2020, 11:01 AM

## 2020-05-17 NOTE — Discharge Instructions (Signed)

## 2020-05-21 ENCOUNTER — Other Ambulatory Visit: Payer: Self-pay | Admitting: Obstetrics and Gynecology

## 2020-05-28 ENCOUNTER — Telehealth: Payer: Self-pay | Admitting: Medical

## 2020-05-28 ENCOUNTER — Ambulatory Visit
Admission: RE | Admit: 2020-05-28 | Discharge: 2020-05-28 | Disposition: A | Discharge status: Home / Self Care | Payer: Medicaid Other | Source: Ambulatory Visit

## 2020-05-28 ENCOUNTER — Other Ambulatory Visit: Payer: Self-pay

## 2020-05-28 DIAGNOSIS — E349 Endocrine disorder, unspecified: Secondary | ICD-10-CM | POA: Insufficient documentation

## 2020-05-28 DIAGNOSIS — Z3A01 Less than 8 weeks gestation of pregnancy: Secondary | ICD-10-CM | POA: Diagnosis present

## 2020-05-28 NOTE — Telephone Encounter (Signed)
I called Ms. MARIN WISNER to discuss [place] results. The patient's identity was confirmed using two identifiers. [facility] at [redacted]w[redacted]d was seen on [place] today with normal FHR. 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 Patient advised of this as well as first trimester warning signs. Has appointment to start prenatal care at [facility] Has been advised to inform her provider there of this [place] so that they can review and follow-up accordingly. All questions were answered. Patient voiced understanding.   Vonzella Nipple, PA-C 05/28/2020 10:39 AM

## 2020-06-10 LAB — OB RESULTS CONSOLE HIV ANTIBODY (ROUTINE TESTING): HIV: NONREACTIVE

## 2020-06-10 LAB — OB RESULTS CONSOLE ABO/RH: RH Type: POSITIVE

## 2020-06-10 LAB — OB RESULTS CONSOLE HEPATITIS B SURFACE ANTIGEN: Hepatitis B Surface Ag: NEGATIVE

## 2020-06-10 LAB — OB RESULTS CONSOLE RUBELLA ANTIBODY, IGM: Rubella: IMMUNE

## 2020-06-10 LAB — OB RESULTS CONSOLE RPR: RPR: NONREACTIVE

## 2020-06-10 LAB — OB RESULTS CONSOLE ANTIBODY SCREEN: Antibody Screen: NEGATIVE

## 2020-06-26 ENCOUNTER — Other Ambulatory Visit: Payer: Self-pay

## 2020-06-26 ENCOUNTER — Ambulatory Visit
Admission: RE | Admit: 2020-06-26 | Discharge: 2020-06-26 | Disposition: A | Discharge status: Home / Self Care | Payer: Medicaid Other | Source: Ambulatory Visit | Attending: Emergency Medicine | Admitting: Emergency Medicine

## 2020-06-26 VITALS — BP 98/53 | HR 85 | Temp 98.3°F | Resp 18

## 2020-06-26 DIAGNOSIS — Z20822 Contact with and (suspected) exposure to covid-19: Secondary | ICD-10-CM

## 2020-06-26 LAB — POCT URINALYSIS DIP (MANUAL ENTRY)
Bilirubin, UA: NEGATIVE
Blood, UA: NEGATIVE
Glucose, UA: NEGATIVE mg/dL
Ketones, POC UA: NEGATIVE mg/dL
Leukocytes, UA: NEGATIVE
Nitrite, UA: NEGATIVE
Protein Ur, POC: NEGATIVE mg/dL
Spec Grav, UA: 1.025 (ref 1.010–1.025)
Urobilinogen, UA: 0.2 E.U./dL
pH, UA: 7.5 (ref 5.0–8.0)

## 2020-06-26 MED ORDER — ALBUTEROL SULFATE HFA 108 (90 BASE) MCG/ACT IN AERS: 2.0000 | INHALATION_SPRAY | Freq: Four times a day (QID) | RESPIRATORY_TRACT | 2 refills | Status: DC | PRN

## 2020-06-26 MED ORDER — AEROCHAMBER PLUS FLO-VU MEDIUM MISC: 1.0000 | Freq: Once | 0 refills | Status: AC

## 2020-06-26 NOTE — ED Triage Notes (Signed)
Pt here for cough, fatigue, body aches and sob x 1 week; pt requesting covid test; pt sts is currently [redacted] weeks pregnant

## 2020-06-26 NOTE — ED Provider Notes (Signed)
[facility]    CSN: [id number] Arrival date & time: 06/26/20  1759      History   Chief Complaint Chief Complaint  Patient presents with  . Generalized Body Aches  . Shortness of Breath    HPI KAPRICE KAGE is a 24 y.o. female  Presenting for Covid testing.  Patient endorsing weeklong course of dry cough, fatigue, myalgias with increased dyspnea on exertion.  Denies leg swelling, chest pain or palpitations, fever.  No known sick contacts.  Patient currently [redacted] weeks gestation: Has OB established.  No vaginal discharge, pelvic pain, bleeding.  Does have urinary frequency with slight urgency.  No polydipsia, polyphagia, polyuria, dysuria.  Past Medical History:  Diagnosis Date  . Anxiety   . Bipolar disease, manic (HCC)   . Cyclical vomiting syndrome   . Depression   . Schizophrenia [facility])     Patient Active Problem List   Diagnosis Date Noted  . Positive pregnancy test 05/09/2020  . Normal postpartum course 06/15/2018  . Indication for care in labor and delivery, antepartum 06/13/2018  . Cannabis abuse, continuous 04/02/2018  . Gastroesophageal reflux disease 04/02/2018  . [redacted] weeks gestation of pregnancy 04/02/2018  . Vomiting pregnancy 04/02/2018  . MDD (major depressive disorder) 09/10/2015  . Major depressive disorder, recurrent episode (HCC) 09/10/2015  . Polysubstance abuse (HCC) 09/10/2015  . Unspecified mood (affective) disorder (HCC) 09/10/2015  . Secondary amenorrhea 02/27/2015  . Cyclical vomiting syndrome 02/10/2015    Past Surgical History:  Procedure Laterality Date  . TONSILLECTOMY      OB History    Gravida  2   Para  1   Term  1   Preterm  0   AB  0   Living  1     SAB  0   IAB  0   Ectopic  0   Multiple  0   Live Births  1            Home Medications    Prior to Admission medications   Medication Sig Start Date End Date Taking? Authorizing Provider  albuterol (VENTOLIN HFA) 108 (90 Base) MCG/ACT  inhaler Inhale 2 puffs into the lungs every 6 (six) hours as needed for wheezing or shortness of breath. 06/26/20   Hall-Potvin, [place], PA-C  ondansetron (ZOFRAN ODT) 4 MG disintegrating tablet Take 1 tablet (4 mg total) by mouth every 8 (eight) hours as needed for nausea or vomiting. 04/15/20   Wieters, Hallie C, PA-C  Prenatal Vit-Fe Fumarate-FA (MULTIVITAMIN-PRENATAL) 27-0.8 MG TABS tablet Take 1 tablet by mouth daily at 12 noon. 05/17/20   Rolm Bookbinder, CNM  promethazine (PHENERGAN) 25 MG tablet Take 1 tablet (25 mg total) by mouth every 6 (six) hours as needed for nausea or vomiting. 02/28/20   Wieters, Fran Lowes C, PA-C  Spacer/Aero-Holding Chambers (AEROCHAMBER PLUS FLO-VU MEDIUM) MISC 1 each by Other route once for 1 dose. 06/26/20 06/26/20  Hall-Potvin, [place], PA-C  metoCLOPramide (REGLAN) 10 MG tablet Take 1 tablet (10 mg total) by mouth 3 (three) times daily before meals. 11/15/18 10/16/19  Wurst, Lowanda Foster, PA-C  omeprazole (PRILOSEC) 20 MG capsule Take 1 capsule (20 mg total) by mouth daily. 11/20/19 12/11/19  Darr, Gerilyn Pilgrim, PA-C    Family History Family History  Problem Relation Age of Onset  . Hypertension Mother     Social History Social History   Tobacco Use  . Smoking status: Former Smoker    Packs/day: 0.00    Types: Cigarettes  Quit date: 10/08/2017    Years since quitting: 2.7  . Smokeless tobacco: Never Used  Vaping Use  . Vaping Use: Never used  Substance Use Topics  . Alcohol use: Not Currently    Alcohol/week: 14.0 standard drinks    Types: 14 Standard drinks or equivalent per week    Comment: daily  . Drug use: Yes    Types: Marijuana    Comment: last use 09/06/19,     Allergies   Patient has no known allergies.   Review of Systems Review of Systems  Constitutional: Positive for fatigue. Negative for fever.  HENT: Positive for congestion. Negative for dental problem, ear pain, facial swelling, hearing loss, sinus pain, sore throat, trouble swallowing  and voice change.   Eyes: Negative for photophobia, pain and visual disturbance.  Respiratory: Positive for cough. Negative for shortness of breath.   Cardiovascular: Negative for chest pain and palpitations.  Gastrointestinal: Negative for constipation, diarrhea and vomiting.  Genitourinary: Positive for frequency and urgency. Negative for dysuria, flank pain, hematuria, pelvic pain, vaginal bleeding, vaginal discharge and vaginal pain.  Musculoskeletal: Positive for myalgias. Negative for arthralgias.  Neurological: Negative for dizziness and headaches.     Physical Exam Triage Vital Signs ED Triage Vitals [06/26/20 1812]  Enc Vitals Group     BP (!) 98/53     Pulse Rate 85     Resp 18     Temp 98.3 F (36.8 C)     Temp Source Oral     SpO2 99 %     Weight      Height      Head Circumference      Peak Flow      Pain Score 4     Pain Loc      Pain Edu?      Excl. in GC?    No data found.  Updated Vital Signs BP (!) 98/53 (BP Location: Left Arm)   Pulse 85   Temp 98.3 F (36.8 C) (Oral)   Resp 18   LMP 04/01/2020   SpO2 99%   Visual Acuity Right Eye Distance:   Left Eye Distance:   Bilateral Distance:    Right Eye Near:   Left Eye Near:    Bilateral Near:     Physical Exam Constitutional:      General: She is not in acute distress.    Appearance: She is not ill-appearing or diaphoretic.  HENT:     Head: Normocephalic and atraumatic.     Right Ear: Tympanic membrane and ear canal normal.     Left Ear: Tympanic membrane and ear canal normal.     Mouth/Throat:     Mouth: Mucous membranes are moist.     Pharynx: Oropharynx is clear. No oropharyngeal exudate or posterior oropharyngeal erythema.  Eyes:     General: No scleral icterus.    Conjunctiva/sclera: Conjunctivae normal.     Pupils: Pupils are equal, round, and reactive to light.  Neck:     Comments: Trachea midline, negative JVD Cardiovascular:     Rate and Rhythm: Normal rate and regular rhythm.      Heart sounds: No murmur heard. No gallop.   Pulmonary:     Effort: Pulmonary effort is normal. No respiratory distress.     Breath sounds: No wheezing, rhonchi or rales.  Musculoskeletal:     Cervical back: Neck supple. No tenderness.  Lymphadenopathy:     Cervical: No cervical adenopathy.  Skin:  Capillary Refill: Capillary refill takes less than 2 seconds.     Coloration: Skin is not jaundiced or pale.     Findings: No rash.  Neurological:     General: No focal deficit present.     Mental Status: She is alert and oriented to person, place, and time.      UC Treatments / Results  Labs (all labs ordered are listed, but only abnormal results are displayed) Labs Reviewed  NOVEL CORONAVIRUS, NAA  POCT URINALYSIS DIP (MANUAL ENTRY)    EKG   Radiology No results found.  Procedures Procedures (including critical care time)  Medications Ordered in UC Medications - No data to display  Initial Impression / Assessment and Plan / UC Course  I have reviewed the triage vital signs and the nursing notes.  Pertinent labs & imaging results that were available during my care of the patient were reviewed by me and considered in my medical decision making (see chart for details).     Patient afebrile, nontoxic, with SpO2 99%.  Urine dip negative.  Covid PCR pending.  Patient to quarantine until results are back.  We will treat supportively as outlined below.  Return precautions discussed, patient verbalized understanding and is agreeable to plan. Final Clinical Impressions(s) / UC Diagnoses   Final diagnoses:  Encounter for screening laboratory testing for COVID-19 virus     Discharge Instructions     Your COVID test is pending - it is important to quarantine / isolate at home until your results are back. If you test positive and would like further evaluation for persistent or worsening symptoms, you may schedule an E-visit or virtual (video) visit throughout the [facility] app or website.  PLEASE NOTE: If you develop severe chest pain or shortness of breath please go to the ER or call 9-1-1 for further evaluation --> DO NOT schedule electronic or virtual visits for this. Please call our office for further guidance / recommendations as needed.  For information about the Covid vaccine, please visit [email or web address]    ED Prescriptions    Medication Sig Dispense Auth. Provider   albuterol (VENTOLIN HFA) 108 (90 Base) MCG/ACT inhaler Inhale 2 puffs into the lungs every 6 (six) hours as needed for wheezing or shortness of breath. 8 g Hall-Potvin, [place], PA-C   Spacer/Aero-Holding Chambers (AEROCHAMBER PLUS FLO-VU MEDIUM) MISC 1 each by Other route once for 1 dose. 1 each Hall-Potvin, [place], PA-C     PDMP not reviewed this encounter.   Odette Fraction [place], [place] 06/26/20 1908

## 2020-06-26 NOTE — Discharge Instructions (Signed)
Your COVID test is pending - it is important to quarantine / isolate at home until your results are back. °If you test positive and would like further evaluation for persistent or worsening symptoms, you may schedule an E-visit or virtual (video) visit throughout the [facility] MyChart app or website. ° °PLEASE NOTE: If you develop severe chest pain or shortness of breath please go to the ER or call 9-1-1 for further evaluation --> DO NOT schedule electronic or virtual visits for this. °Please call our office for further guidance / recommendations as needed. ° °For information about the Covid vaccine, please visit [email or web address] °

## 2020-06-28 LAB — SARS-COV-2, NAA 2 DAY TAT

## 2020-06-28 LAB — NOVEL CORONAVIRUS, NAA: SARS-CoV-2, NAA: NOT DETECTED

## 2020-07-01 ENCOUNTER — Other Ambulatory Visit: Payer: Self-pay

## 2020-07-01 ENCOUNTER — Encounter (HOSPITAL_COMMUNITY): Payer: Self-pay | Admitting: Obstetrics and Gynecology

## 2020-07-01 ENCOUNTER — Inpatient Hospital Stay (HOSPITAL_COMMUNITY)
Admission: AD | Admit: 2020-07-01 | Discharge: 2020-07-01 | Disposition: A | Discharge status: Home / Self Care | Payer: Medicaid Other | Attending: Obstetrics and Gynecology | Admitting: Obstetrics and Gynecology

## 2020-07-01 DIAGNOSIS — Z3A13 13 weeks gestation of pregnancy: Secondary | ICD-10-CM | POA: Insufficient documentation

## 2020-07-01 DIAGNOSIS — Z87891 Personal history of nicotine dependence: Secondary | ICD-10-CM | POA: Insufficient documentation

## 2020-07-01 DIAGNOSIS — R109 Unspecified abdominal pain: Secondary | ICD-10-CM | POA: Diagnosis present

## 2020-07-01 DIAGNOSIS — N898 Other specified noninflammatory disorders of vagina: Secondary | ICD-10-CM | POA: Insufficient documentation

## 2020-07-01 DIAGNOSIS — O26891 Other specified pregnancy related conditions, first trimester: Secondary | ICD-10-CM | POA: Insufficient documentation

## 2020-07-01 DIAGNOSIS — R1032 Left lower quadrant pain: Secondary | ICD-10-CM | POA: Diagnosis not present

## 2020-07-01 HISTORY — DX: Headache, unspecified: R51.9

## 2020-07-01 HISTORY — DX: Dyspnea, unspecified: R06.00

## 2020-07-01 HISTORY — DX: Acute upper respiratory infection, unspecified: J06.9

## 2020-07-01 HISTORY — DX: Unspecified asthma, uncomplicated: J45.909

## 2020-07-01 LAB — URINALYSIS, ROUTINE W REFLEX MICROSCOPIC
Bilirubin Urine: NEGATIVE
Glucose, UA: NEGATIVE mg/dL
Hgb urine dipstick: NEGATIVE
Ketones, ur: NEGATIVE mg/dL
Leukocytes,Ua: NEGATIVE
Nitrite: NEGATIVE
Protein, ur: NEGATIVE mg/dL
Specific Gravity, Urine: 1.017 (ref 1.005–1.030)
pH: 7 (ref 5.0–8.0)

## 2020-07-01 LAB — WET PREP, GENITAL
Clue Cells Wet Prep HPF POC: NONE SEEN
Sperm: NONE SEEN
Trich, Wet Prep: NONE SEEN
Yeast Wet Prep HPF POC: NONE SEEN

## 2020-07-01 NOTE — MAU Provider Note (Signed)
History     CSN: [id number]  Arrival date and time: 07/01/20 1821   None     Chief Complaint  Patient presents with   Abdominal Pain   HPI   Angela Christian is a 24 y.o. G2P1001 female at [redacted]w[redacted]d who presents to MAU with left lower side abdominal pain, that only occurs with coughing. Describes it as sharp, self resolving, lasting only a few seconds. Denies vaginal bleeding. Does endorse some mild vaginal discharge. Established with OB.    OB History    Gravida  2   Para  1   Term  1   Preterm  0   AB  0   Living  1     SAB  0   IAB  0   Ectopic  0   Multiple  0   Live Births  1           Past Medical History:  Diagnosis Date   Anxiety    Bipolar disease, manic (HCC)    Cyclical vomiting syndrome    Depression    Schizophrenia (HCC)     Past Surgical History:  Procedure Laterality Date   TONSILLECTOMY      Family History  Problem Relation Age of Onset   Hypertension Mother     Social History   Tobacco Use   Smoking status: Former Smoker    Packs/day: 0.00    Types: Cigarettes    Quit date: 10/08/2017    Years since quitting: 2.7   Smokeless tobacco: Never Used  Vaping Use   Vaping Use: Never used  Substance Use Topics   Alcohol use: Not Currently    Alcohol/week: 14.0 standard drinks    Types: 14 Standard drinks or equivalent per week    Comment: daily   Drug use: Yes    Types: Marijuana    Comment: last use 09/06/19,    Allergies: No Known Allergies  Medications Prior to Admission  Medication Sig Dispense Refill Last Dose   albuterol (VENTOLIN HFA) 108 (90 Base) MCG/ACT inhaler Inhale 2 puffs into the lungs every 6 (six) hours as needed for wheezing or shortness of breath. 8 g 2    ondansetron (ZOFRAN ODT) 4 MG disintegrating tablet Take 1 tablet (4 mg total) by mouth every 8 (eight) hours as needed for nausea or vomiting. 20 tablet 0    Prenatal Vit-Fe Fumarate-FA (MULTIVITAMIN-PRENATAL) 27-0.8 MG TABS tablet  Take 1 tablet by mouth daily at 12 noon. 30 tablet 11    promethazine (PHENERGAN) 25 MG tablet Take 1 tablet (25 mg total) by mouth every 6 (six) hours as needed for nausea or vomiting. 30 tablet 0     Review of Systems  Constitutional: Negative for chills and fever.  HENT: Positive for rhinorrhea.   Respiratory: Positive for cough. Negative for chest tightness and shortness of breath.   Cardiovascular: Negative for chest pain.  Genitourinary: Positive for vaginal discharge. Negative for dysuria and vaginal bleeding.  Musculoskeletal: Negative for back pain.   Physical Exam   Blood pressure (!) 104/53, pulse 90, temperature 98.4 F (36.9 C), temperature source Oral, resp. rate 18, height 5\' 5"  (1.651 m), weight 52.4 kg, last menstrual period 04/01/2020, SpO2 99 %.  Physical Exam Constitutional:      Appearance: She is well-developed.  HENT:     Head: Normocephalic and atraumatic.  Eyes:     Extraocular Movements: Extraocular movements intact.  Cardiovascular:     Rate and Rhythm: Normal  rate and regular rhythm.  Pulmonary:     Effort: Pulmonary effort is normal.     Breath sounds: Normal breath sounds.  Abdominal:     General: Abdomen is flat. Bowel sounds are normal.     Palpations: Abdomen is soft.     Tenderness: There is no abdominal tenderness.  Skin:    General: Skin is warm and dry.  Neurological:     General: No focal deficit present.     Mental Status: She is alert and oriented to person, place, and time.     MAU Course  Procedures  MDM Abdominal exam benign. VSS. FHR 161.  UA unremarkable. Wet prep with many WBCs--otherwise negative. GC/Chalmydia pending.   Assessment and Plan   1. Left lower quadrant abdominal pain 2. Vaginal discharge during pregnancy, antepartum Suspect abdominal pain related to muscle strain with coughing as is temporally related and self resolves. Wet prep unrevealing. Cultures pending. Have instructed patient to f/u with OB for  request for light duty at work---will write out for missed shift tonight. Strict return precautions including worsening of pain, LOF, vaginal bleeding, vomiting, fevers discussed.    De Hollingshead 07/01/2020, 7:13 PM

## 2020-07-01 NOTE — Discharge Instructions (Signed)
Your vaginal swab was negative for yeast, bacterial vaginosis, and trichomonas. Gonorrhea and Chlamydia are still pending and if positive, you will receive a call and treatment instructions. I suspect your pain is related to muscle strain from cough. If you experience worsening of pain, leaking of fluid, vaginal bleeding, vomiting, or fevers please return or call your OB.

## 2020-07-01 NOTE — MAU Note (Signed)
Presents with c/o left lower abdominal pain x1 week when coughing.  States "wants to be sure everything is okay".  Denies VB.

## 2020-07-02 LAB — GC/CHLAMYDIA PROBE AMP (~~LOC~~) NOT AT ARMC
Chlamydia: NEGATIVE
Comment: NEGATIVE
Comment: NORMAL
Neisseria Gonorrhea: NEGATIVE

## 2020-07-03 ENCOUNTER — Other Ambulatory Visit: Payer: Self-pay

## 2020-07-03 ENCOUNTER — Inpatient Hospital Stay (HOSPITAL_COMMUNITY)
Admission: AD | Admit: 2020-07-03 | Discharge: 2020-07-03 | Disposition: A | Discharge status: Home / Self Care | Payer: Medicaid Other | Attending: Obstetrics and Gynecology | Admitting: Obstetrics and Gynecology

## 2020-07-03 DIAGNOSIS — O219 Vomiting of pregnancy, unspecified: Secondary | ICD-10-CM | POA: Insufficient documentation

## 2020-07-03 DIAGNOSIS — R197 Diarrhea, unspecified: Secondary | ICD-10-CM | POA: Diagnosis not present

## 2020-07-03 DIAGNOSIS — Z3A13 13 weeks gestation of pregnancy: Secondary | ICD-10-CM | POA: Insufficient documentation

## 2020-07-03 DIAGNOSIS — M545 Low back pain, unspecified: Secondary | ICD-10-CM | POA: Insufficient documentation

## 2020-07-03 DIAGNOSIS — A084 Viral intestinal infection, unspecified: Secondary | ICD-10-CM

## 2020-07-03 DIAGNOSIS — O26891 Other specified pregnancy related conditions, first trimester: Secondary | ICD-10-CM | POA: Insufficient documentation

## 2020-07-03 DIAGNOSIS — J45909 Unspecified asthma, uncomplicated: Secondary | ICD-10-CM | POA: Diagnosis not present

## 2020-07-03 DIAGNOSIS — Z87891 Personal history of nicotine dependence: Secondary | ICD-10-CM | POA: Diagnosis not present

## 2020-07-03 DIAGNOSIS — O99511 Diseases of the respiratory system complicating pregnancy, first trimester: Secondary | ICD-10-CM | POA: Diagnosis not present

## 2020-07-03 DIAGNOSIS — F319 Bipolar disorder, unspecified: Secondary | ICD-10-CM | POA: Insufficient documentation

## 2020-07-03 DIAGNOSIS — O99341 Other mental disorders complicating pregnancy, first trimester: Secondary | ICD-10-CM | POA: Diagnosis not present

## 2020-07-03 DIAGNOSIS — O99891 Other specified diseases and conditions complicating pregnancy: Secondary | ICD-10-CM | POA: Diagnosis not present

## 2020-07-03 DIAGNOSIS — O98511 Other viral diseases complicating pregnancy, first trimester: Secondary | ICD-10-CM | POA: Diagnosis not present

## 2020-07-03 DIAGNOSIS — F209 Schizophrenia, unspecified: Secondary | ICD-10-CM | POA: Diagnosis not present

## 2020-07-03 DIAGNOSIS — Z79899 Other long term (current) drug therapy: Secondary | ICD-10-CM | POA: Diagnosis not present

## 2020-07-03 LAB — COMPREHENSIVE METABOLIC PANEL
ALT: 15 U/L (ref 0–44)
AST: 20 U/L (ref 15–41)
Albumin: 3.5 g/dL (ref 3.5–5.0)
Alkaline Phosphatase: 38 U/L (ref 38–126)
Anion gap: 11 (ref 5–15)
BUN: 10 mg/dL (ref 6–20)
CO2: 23 mmol/L (ref 22–32)
Calcium: 9.7 mg/dL (ref 8.9–10.3)
Chloride: 101 mmol/L (ref 98–111)
Creatinine, Ser: 0.8 mg/dL (ref 0.44–1.00)
GFR, Estimated: >60 mL/min (ref >60)
Glucose, Bld: 82 mg/dL (ref 70–99)
Potassium: 3.4 mmol/L — ABNORMAL LOW (ref 3.5–5.1)
Sodium: 135 mmol/L (ref 135–145)
Total Bilirubin: 0.8 mg/dL (ref 0.3–1.2)
Total Protein: 6.9 g/dL (ref 6.5–8.1)

## 2020-07-03 LAB — URINALYSIS, ROUTINE W REFLEX MICROSCOPIC
Bilirubin Urine: NEGATIVE
Glucose, UA: NEGATIVE mg/dL
Hgb urine dipstick: NEGATIVE
Ketones, ur: 20 mg/dL — AB
Leukocytes,Ua: NEGATIVE
Nitrite: NEGATIVE
Protein, ur: 100 mg/dL — AB
Specific Gravity, Urine: 1.032 — ABNORMAL HIGH (ref 1.005–1.030)
pH: 5 (ref 5.0–8.0)

## 2020-07-03 MED ORDER — LACTATED RINGERS IV BOLUS
1000.0000 mL | Freq: Once | INTRAVENOUS | Status: AC
  Administered 2020-07-03: 18:00:00 1000 mL via INTRAVENOUS

## 2020-07-03 MED ORDER — PROMETHAZINE HCL 25 MG/ML IJ SOLN
25.0000 mg | Freq: Once | INTRAMUSCULAR | Status: AC
  Administered 2020-07-03: 18:00:00 25 mg via INTRAVENOUS
  Filled 2020-07-03: qty 1

## 2020-07-03 NOTE — MAU Note (Signed)
Came in a couple days ago for abd pain, "muscle strain". They tried to give her a note for work, but she declined it. Her dr is supposed to be giving her a note to switch her to light duty tomorrow.  Has been sick all week, hasn't really kept anything down, med for nausea was sent in this afternoon, but she has not picked it up.

## 2020-07-03 NOTE — MAU Provider Note (Signed)
History     [id number]  Arrival date and time: 07/03/20 1702    Chief Complaint  Patient presents with  . Emesis  . Abdominal Pain     HPI Angela Christian is a 24 y.o. at [redacted]w[redacted]d by 7wk [place] with PMHx notable for cyclical vomiting syndrome, who presents for nausea and vomiting.   Seen in MAU multiple times recently 07/01/2020 p/w LLQ abdominal pain, benign findings likely secondary to coughing, d/c to home 06/26/2020 seen at [facility] UC for week of URI symptoms, COVID test neg, otherwise well appearing, d/c home Seen several times in October for diagnosis of new pregnancy/beta trends/confirmation of location of pregnancy  Today reports she was here two days prior, offered work note but declined Has had n/v since yesterday, unsure how many times Also had diarrhea x3 Has been able to drink a little water but not eat anything [place] will be providing note for work tomorrow, needs note for today No one else is sick around her No fevers No burning or pain with urination Has some lower back pain bilaterally Has not had a COVID vaccine Still has cough Has not eaten any weird foods Nothing has made symptoms better Drinking OJ made symptoms worse Has not taken anything for her symptoms  Denies vaginal bleeding, loss of fluid, contractions Feeling some early baby movements   Wt Readings from Last 3 Encounters:  07/03/20 48.8 kg  07/01/20 52.4 kg  05/15/20 49.9 kg        OB History    Gravida  2   Para  1   Term  1   Preterm  0   AB  0   Living  1     SAB  0   IAB  0   Ectopic  0   Multiple  0   Live Births  1           Past Medical History:  Diagnosis Date  . Anxiety   . Arthritis   . Asthma   . Bipolar disease, manic (HCC)   . Cyclical vomiting syndrome   . Depression   . Dyspnea   . Headache   . Recurrent upper respiratory infection (URI)   . Schizophrenia [facility])     Past Surgical History:  Procedure Laterality Date  .  TONSILLECTOMY      Family History  Problem Relation Age of Onset  . Hypertension Mother   . Seizures Maternal Uncle   . Breast cancer Paternal Aunt   . Kidney disease Maternal Grandmother     Social History   Socioeconomic History  . Marital status: Single    Spouse name: Not on file  . Number of children: Not on file  . Years of education: Not on file  . Highest education level: Not on file  Occupational History  . Not on file  Tobacco Use  . Smoking status: Former Smoker    Packs/day: 0.00    Types: Cigarettes    Quit date: 10/08/2017    Years since quitting: 2.7  . Smokeless tobacco: Never Used  Vaping Use  . Vaping Use: Never used  Substance and Sexual Activity  . Alcohol use: Not Currently    Alcohol/week: 14.0 standard drinks    Types: 14 Standard drinks or equivalent per week    Comment: daily  . Drug use: Yes    Types: Marijuana    Comment: last use 09/06/19,  . Sexual activity: Yes    Birth  control/protection: None  Other Topics Concern  . Not on file  Social History Narrative  . Not on file   Social Determinants of Health   Financial Resource Strain: Not on file  Food Insecurity: Not on file  Transportation Needs: Not on file  Physical Activity: Not on file  Stress: Not on file  Social Connections: Not on file  Intimate Partner Violence: Not on file    No Known Allergies  No current facility-administered medications on file prior to encounter.   Current Outpatient Medications on File Prior to Encounter  Medication Sig Dispense Refill  . Prenatal Vit-Fe Fumarate-FA (MULTIVITAMIN-PRENATAL) 27-0.8 MG TABS tablet Take 1 tablet by mouth daily at 12 noon. 30 tablet 11  . albuterol (VENTOLIN HFA) 108 (90 Base) MCG/ACT inhaler Inhale 2 puffs into the lungs every 6 (six) hours as needed for wheezing or shortness of breath. 8 g 2  . ondansetron (ZOFRAN ODT) 4 MG disintegrating tablet Take 1 tablet (4 mg total) by mouth every 8 (eight) hours as needed for  nausea or vomiting. 20 tablet 0  . promethazine (PHENERGAN) 25 MG tablet Take 1 tablet (25 mg total) by mouth every 6 (six) hours as needed for nausea or vomiting. 30 tablet 0  . [DISCONTINUED] metoCLOPramide (REGLAN) 10 MG tablet Take 1 tablet (10 mg total) by mouth 3 (three) times daily before meals. 15 tablet 0  . [DISCONTINUED] omeprazole (PRILOSEC) 20 MG capsule Take 1 capsule (20 mg total) by mouth daily. 30 capsule 0     ROS Pertinent positives and negative per HPI, all others reviewed and negative  Physical Exam   BP (!) 119/57   Pulse 76   Temp 98.7 F (37.1 C) (Oral)   Resp 16   Ht 5\' 5"  (1.651 m)   Wt 48.8 kg   LMP 04/01/2020   SpO2 100%   BMI 17.91 kg/m   Physical Exam Vitals reviewed.  Constitutional:      General: She is not in acute distress.    Appearance: She is well-developed and well-nourished. She is not diaphoretic.  Eyes:     General: No scleral icterus. Pulmonary:     Effort: Pulmonary effort is normal. No respiratory distress.  Abdominal:     General: There is no distension.     Palpations: Abdomen is soft.     Tenderness: There is no abdominal tenderness. There is no guarding or rebound.  Musculoskeletal:        General: No edema.  Skin:    General: Skin is warm and dry.  Neurological:     Mental Status: She is alert.     Coordination: Coordination normal.  Psychiatric:        Mood and Affect: Mood and affect normal.      Cervical Exam    Bedside Ultrasound Not done  My interpretation: n/a  FHT FHR 166 bpm  Labs Results for orders placed or performed during the hospital encounter of 07/03/20 (from the past 24 hour(s))  Urinalysis, Routine w reflex microscopic Urine, Clean Catch     Status: Abnormal   Collection Time: 07/03/20  5:34 PM  Result Value Ref Range   Color, Urine AMBER (A) YELLOW   APPearance HAZY (A) CLEAR   Specific Gravity, Urine 1.032 (H) 1.005 - 1.030   pH 5.0 5.0 - 8.0   Glucose, UA NEGATIVE NEGATIVE mg/dL    Hgb urine dipstick NEGATIVE NEGATIVE   Bilirubin Urine NEGATIVE NEGATIVE   Ketones, ur 20 (A) NEGATIVE mg/dL  Protein, ur 100 (A) NEGATIVE mg/dL   Nitrite NEGATIVE NEGATIVE   Leukocytes,Ua NEGATIVE NEGATIVE   RBC / HPF 0-5 0 - 5 RBC/hpf   WBC, UA 0-5 0 - 5 WBC/hpf   Bacteria, UA RARE (A) NONE SEEN   Squamous Epithelial / LPF 11-20 0 - 5   Mucus PRESENT   Comprehensive metabolic panel     Status: Abnormal   Collection Time: 07/03/20  5:54 PM  Result Value Ref Range   Sodium 135 135 - 145 mmol/L   Potassium 3.4 (L) 3.5 - 5.1 mmol/L   Chloride 101 98 - 111 mmol/L   CO2 23 22 - 32 mmol/L   Glucose, Bld 82 70 - 99 mg/dL   BUN 10 6 - 20 mg/dL   Creatinine, Ser 9.89 0.44 - 1.00 mg/dL   Calcium 9.7 8.9 - 21.1 mg/dL   Total Protein 6.9 6.5 - 8.1 g/dL   Albumin 3.5 3.5 - 5.0 g/dL   AST 20 15 - 41 U/L   ALT 15 0 - 44 U/L   Alkaline Phosphatase 38 38 - 126 U/L   Total Bilirubin 0.8 0.3 - 1.2 mg/dL   GFR, Estimated >94 >17 mL/min   Anion gap 11 5 - 15    Imaging No results found.  MAU Course  Procedures  Lab Orders     Urinalysis, Routine w reflex microscopic     Comprehensive metabolic panel Meds ordered this encounter  Medications  . lactated ringers bolus 1,000 mL  . promethazine (PHENERGAN) injection 25 mg   Imaging Orders  No imaging studies ordered today    MDM moderate  Assessment and Plan  #Nausea and vomiting in second trimester Likely viral GI illness, recent neg COVID test. Treated w 1L LR, phenergan, able to tolerate PO. CMP unremarkable.  D/c to home in stable condition.  #FWB Normal FHR  Mary Sella Crissie Reese

## 2020-07-03 NOTE — Discharge Instructions (Signed)

## 2020-08-10 ENCOUNTER — Other Ambulatory Visit: Payer: Self-pay

## 2020-08-10 ENCOUNTER — Encounter (HOSPITAL_COMMUNITY): Payer: Self-pay | Admitting: Obstetrics and Gynecology

## 2020-08-10 ENCOUNTER — Inpatient Hospital Stay (HOSPITAL_COMMUNITY)
Admission: AD | Admit: 2020-08-10 | Discharge: 2020-08-10 | Disposition: A | Discharge status: Home / Self Care | Payer: Medicaid Other | Attending: Obstetrics and Gynecology | Admitting: Obstetrics and Gynecology

## 2020-08-10 DIAGNOSIS — F1290 Cannabis use, unspecified, uncomplicated: Secondary | ICD-10-CM | POA: Insufficient documentation

## 2020-08-10 DIAGNOSIS — Z87891 Personal history of nicotine dependence: Secondary | ICD-10-CM | POA: Insufficient documentation

## 2020-08-10 DIAGNOSIS — O219 Vomiting of pregnancy, unspecified: Secondary | ICD-10-CM | POA: Diagnosis present

## 2020-08-10 DIAGNOSIS — R109 Unspecified abdominal pain: Secondary | ICD-10-CM | POA: Insufficient documentation

## 2020-08-10 DIAGNOSIS — O26892 Other specified pregnancy related conditions, second trimester: Secondary | ICD-10-CM | POA: Diagnosis not present

## 2020-08-10 DIAGNOSIS — O99322 Drug use complicating pregnancy, second trimester: Secondary | ICD-10-CM | POA: Insufficient documentation

## 2020-08-10 DIAGNOSIS — Z3A18 18 weeks gestation of pregnancy: Secondary | ICD-10-CM | POA: Diagnosis not present

## 2020-08-10 LAB — URINALYSIS, ROUTINE W REFLEX MICROSCOPIC
Bilirubin Urine: NEGATIVE
Glucose, UA: NEGATIVE mg/dL
Hgb urine dipstick: NEGATIVE
Ketones, ur: 80 mg/dL — AB
Leukocytes,Ua: NEGATIVE
Nitrite: NEGATIVE
Protein, ur: 30 mg/dL — AB
Specific Gravity, Urine: 1.03 (ref 1.005–1.030)
pH: 6 (ref 5.0–8.0)

## 2020-08-10 MED ORDER — SCOPOLAMINE 1 MG/3DAYS TD PT72: 1.0000 | MEDICATED_PATCH | TRANSDERMAL | 12 refills | Status: DC

## 2020-08-10 MED ORDER — METOCLOPRAMIDE HCL 10 MG PO TABS: 10.0000 mg | ORAL_TABLET | Freq: Four times a day (QID) | ORAL | 2 refills | Status: DC

## 2020-08-10 MED ORDER — PROMETHAZINE HCL 25 MG/ML IJ SOLN
25.0000 mg | Freq: Once | INTRAMUSCULAR | Status: AC
  Administered 2020-08-10: 25 mg via INTRAVENOUS
  Filled 2020-08-10: qty 1

## 2020-08-10 MED ORDER — ONDANSETRON 4 MG PO TBDP: 4.0000 mg | ORAL_TABLET | Freq: Three times a day (TID) | ORAL | 2 refills | Status: DC | PRN

## 2020-08-10 MED ORDER — FAMOTIDINE 20 MG PO TABS: 20.0000 mg | ORAL_TABLET | Freq: Two times a day (BID) | ORAL | 0 refills | Status: DC

## 2020-08-10 MED ORDER — ONDANSETRON HCL 4 MG/2ML IJ SOLN
4.0000 mg | Freq: Once | INTRAMUSCULAR | Status: AC
  Administered 2020-08-10: 4 mg via INTRAVENOUS
  Filled 2020-08-10: qty 2

## 2020-08-10 MED ORDER — M.V.I. ADULT IV INJ
Freq: Once | INTRAVENOUS | Status: AC
  Filled 2020-08-10: qty 10

## 2020-08-10 MED ORDER — GLYCOPYRROLATE 0.2 MG/ML IJ SOLN
0.1000 mg | Freq: Once | INTRAMUSCULAR | Status: AC
  Administered 2020-08-10: 0.1 mg via INTRAVENOUS
  Filled 2020-08-10: qty 1

## 2020-08-10 MED ORDER — PROMETHAZINE HCL 25 MG PO TABS: 25.0000 mg | ORAL_TABLET | Freq: Four times a day (QID) | ORAL | 2 refills | Status: DC | PRN

## 2020-08-10 MED ORDER — FAMOTIDINE IN NACL 20-0.9 MG/50ML-% IV SOLN
20.0000 mg | Freq: Once | INTRAVENOUS | Status: AC
  Administered 2020-08-10: 20 mg via INTRAVENOUS
  Filled 2020-08-10: qty 50

## 2020-08-10 MED ORDER — LACTATED RINGERS IV BOLUS
1000.0000 mL | Freq: Once | INTRAVENOUS | Status: AC
  Administered 2020-08-10: 1000 mL via INTRAVENOUS

## 2020-08-10 NOTE — Discharge Instructions (Signed)

## 2020-08-10 NOTE — MAU Provider Note (Signed)
History     CSN: [id number]  Arrival date and time: 08/10/20 1021   Event Date/Time   First Provider Initiated Contact with Patient 08/10/20 1129      Chief Complaint  Patient presents with  . Abdominal Pain  . Emesis   HPI Angela Christian is a 25 y.o. G2P1001 at [redacted]w[redacted]d who presents nausea, vomiting and abdominal pain. She states the nausea and vomiting have been ongoing but she ran out of phenergan so she used mariajuana this morning and states the symptoms only got worse. She states she then developed abdominal pain that comes and goes. She denies any leaking, bleeding or vaginal discharge. She reports feeling fetal movement.   OB History    Gravida  2   Para  1   Term  1   Preterm  0   AB  0   Living  1     SAB  0   IAB  0   Ectopic  0   Multiple  0   Live Births  1           Past Medical History:  Diagnosis Date  . Anxiety   . Arthritis   . Asthma   . Bipolar disease, manic (HCC)   . Cyclical vomiting syndrome   . Depression   . Dyspnea   . Headache   . Recurrent upper respiratory infection (URI)   . Schizophrenia [facility])     Past Surgical History:  Procedure Laterality Date  . TONSILLECTOMY      Family History  Problem Relation Age of Onset  . Hypertension Mother   . Seizures Maternal Uncle   . Breast cancer Paternal Aunt   . Kidney disease Maternal Grandmother     Social History   Tobacco Use  . Smoking status: Former Smoker    Packs/day: 0.00    Types: Cigarettes    Quit date: 10/08/2017    Years since quitting: 2.8  . Smokeless tobacco: Never Used  Vaping Use  . Vaping Use: Never used  Substance Use Topics  . Alcohol use: Not Currently    Alcohol/week: 14.0 standard drinks    Types: 14 Standard drinks or equivalent per week    Comment: daily  . Drug use: Yes    Types: Marijuana    Comment: last use 08/07/2020    Allergies: No Known Allergies  Medications Prior to Admission  Medication Sig Dispense Refill Last Dose   . Prenatal Vit-Fe Fumarate-FA (MULTIVITAMIN-PRENATAL) 27-0.8 MG TABS tablet Take 1 tablet by mouth daily at 12 noon. 30 tablet 11 Past Week at Unknown time  . albuterol (VENTOLIN HFA) 108 (90 Base) MCG/ACT inhaler Inhale 2 puffs into the lungs every 6 (six) hours as needed for wheezing or shortness of breath. 8 g 2   . ondansetron (ZOFRAN ODT) 4 MG disintegrating tablet Take 1 tablet (4 mg total) by mouth every 8 (eight) hours as needed for nausea or vomiting. 20 tablet 0   . promethazine (PHENERGAN) 25 MG tablet Take 1 tablet (25 mg total) by mouth every 6 (six) hours as needed for nausea or vomiting. 30 tablet 0     Review of Systems  Constitutional: Negative.  Negative for fatigue and fever.  HENT: Negative.   Respiratory: Negative.  Negative for shortness of breath.   Cardiovascular: Negative.  Negative for chest pain.  Gastrointestinal: Positive for abdominal pain, nausea and vomiting. Negative for constipation and diarrhea.  Genitourinary: Negative.  Negative for dysuria.  Neurological: Negative.  Negative for dizziness and headaches.   Physical Exam   Blood pressure 140/76, pulse 95, temperature 98.3 F (36.8 C), temperature source Oral, resp. rate 16, last menstrual period 04/01/2020, SpO2 100 %.  Physical Exam Vitals and nursing note reviewed.  Constitutional:      General: She is not in acute distress.    Appearance: She is well-developed and well-nourished.  HENT:     Head: Normocephalic.  Eyes:     Pupils: Pupils are equal, round, and reactive to light.  Cardiovascular:     Rate and Rhythm: Normal rate and regular rhythm.     Heart sounds: Normal heart sounds.  Pulmonary:     Effort: Pulmonary effort is normal. No respiratory distress.     Breath sounds: Normal breath sounds.  Abdominal:     General: Bowel sounds are normal. There is no distension.     Palpations: Abdomen is soft.     Tenderness: There is no abdominal tenderness.  Skin:    General: Skin is warm  and dry.  Neurological:     Mental Status: She is alert and oriented to person, place, and time.  Psychiatric:        Mood and Affect: Mood and affect normal.        Behavior: Behavior normal.        Thought Content: Thought content normal.        Judgment: Judgment normal.    Cervix: closed/thick/posterior  FHR:  146 bpm  MAU Course  Procedures Results for orders placed or performed during the hospital encounter of 08/10/20 (from the past 24 hour(s))  Urinalysis, Routine w reflex microscopic Urine, Clean Catch     Status: Abnormal   Collection Time: 08/10/20 11:23 AM  Result Value Ref Range   Color, Urine AMBER (A) YELLOW   APPearance HAZY (A) CLEAR   Specific Gravity, Urine 1.030 1.005 - 1.030   pH 6.0 5.0 - 8.0   Glucose, UA NEGATIVE NEGATIVE mg/dL   Hgb urine dipstick NEGATIVE NEGATIVE   Bilirubin Urine NEGATIVE NEGATIVE   Ketones, ur 80 (A) NEGATIVE mg/dL   Protein, ur 30 (A) NEGATIVE mg/dL   Nitrite NEGATIVE NEGATIVE   Leukocytes,Ua NEGATIVE NEGATIVE   RBC / HPF 0-5 0 - 5 RBC/hpf   WBC, UA 0-5 0 - 5 WBC/hpf   Bacteria, UA RARE (A) NONE SEEN   Squamous Epithelial / LPF 6-10 0 - 5   Mucus PRESENT    MDM UA LR bolus Phenergan Pepcid Multivitamin bag Zofran Robinol  Patient reports feeling better and improvement of pain. Cervix closed. Lengthy discussion with patient of importance of stopping mariajuana use and the cyclical nature of cannaboid hyperemesis. Patient verbalized understanding.   Assessment and Plan   1. Nausea and vomiting during pregnancy   2. [redacted] weeks gestation of pregnancy    -Discharge home in stable condition -Rx for phenergan, pepcid, zofran, reglan and scopalamine patches sent to pharmacy -Nausea and vomiting precautions discussed -Patient advised to follow-up with OB as scheduled for prenatal care -Patient may return to MAU as needed or if her condition were to change or worsen   Rolm Bookbinder CNM 08/10/2020, 11:29 AM

## 2020-08-10 NOTE — MAU Note (Signed)
Pt reports to mau with c/o n,v, back pain, headache since last week.  Pt reports marijuana use this morning.  Pt denies vag bleeding

## 2020-08-26 ENCOUNTER — Encounter: Payer: Self-pay | Admitting: Obstetrics and Gynecology

## 2020-08-27 ENCOUNTER — Other Ambulatory Visit: Payer: Self-pay | Admitting: Obstetrics and Gynecology

## 2020-08-27 DIAGNOSIS — O43109 Malformation of placenta, unspecified, unspecified trimester: Secondary | ICD-10-CM

## 2020-08-27 DIAGNOSIS — Z363 Encounter for antenatal screening for malformations: Secondary | ICD-10-CM

## 2020-08-27 DIAGNOSIS — Z3A21 21 weeks gestation of pregnancy: Secondary | ICD-10-CM

## 2020-09-03 ENCOUNTER — Ambulatory Visit: Payer: Medicaid Other | Admitting: *Deleted

## 2020-09-03 ENCOUNTER — Ambulatory Visit: Payer: Medicaid Other | Attending: Obstetrics

## 2020-09-03 ENCOUNTER — Other Ambulatory Visit: Payer: Self-pay

## 2020-09-03 ENCOUNTER — Ambulatory Visit (HOSPITAL_BASED_OUTPATIENT_CLINIC_OR_DEPARTMENT_OTHER): Payer: Medicaid Other | Admitting: Maternal & Fetal Medicine

## 2020-09-03 ENCOUNTER — Encounter: Payer: Self-pay | Admitting: *Deleted

## 2020-09-03 VITALS — BP 106/55 | HR 96

## 2020-09-03 DIAGNOSIS — O43109 Malformation of placenta, unspecified, unspecified trimester: Secondary | ICD-10-CM

## 2020-09-03 DIAGNOSIS — O43102 Malformation of placenta, unspecified, second trimester: Secondary | ICD-10-CM | POA: Diagnosis not present

## 2020-09-03 DIAGNOSIS — Z8759 Personal history of other complications of pregnancy, childbirth and the puerperium: Secondary | ICD-10-CM

## 2020-09-03 DIAGNOSIS — Z3A21 21 weeks gestation of pregnancy: Secondary | ICD-10-CM | POA: Diagnosis not present

## 2020-09-03 DIAGNOSIS — Z363 Encounter for antenatal screening for malformations: Secondary | ICD-10-CM | POA: Insufficient documentation

## 2020-09-03 DIAGNOSIS — O09292 Supervision of pregnancy with other poor reproductive or obstetric history, second trimester: Secondary | ICD-10-CM | POA: Insufficient documentation

## 2020-09-03 NOTE — Progress Notes (Signed)
MFM consultation  Date of service 09/03/20 Requesting provider : Nigel Bridgeman CNM Reason for request: Placental finding.   Angela Christian is a G2P1 who is here for evaluation of a subamniotic structure seen on an outside examination at the request of Angela Christian  Her pregnancy is dated by a 7 weeks ultrasound giving her a due date of 01/14/21. She LMP gave her an EDD of 01/06/21. Her dates were not changed however we have changed her dates today given this > 5 day difference.  Today's ultrasound is consistent with the 7 week ultrasound.   Her history is uncomplicated. She had a prior term vaginal delivery without complications however, the pregnancy was being surveillanced for fetal growth restriction.. She denies substance abuse or additional medication exposure besides prenatal vitamins. She denies signs or symptoms of preterm labor. She had a low risk AFP.  Today's ultrasound demonstrated normal anatomy with an EFW of 13th% suggestive of SGA.  There was good amniotic fluid and fetal movement.   We observed the subamniotic heterogeneous structure suggestive of a placental lake as it was transient and with dynamic intravessel flow.   I discussed with Angela Christian that a placental lakes are enlarged intervillous vascular spaces containing maternal blood. Placental lakes are identified in 20% of second-trimester ultrasound examinations.  Numerous lakes have been associated with placental insufficiency and fetal growth restriction, but recent data do not support this. There are no associated anomalies related to placental lakes.  Placental lakes require no follow-up, the prognosis with placental lakes is excellent and routine obstetrical care can be resumed.   Recommendation:  Follow up growth in 6 weeks.   All questions answered.  I spent 30 minutes with > 50% in face to face consultation.   Novella Olive, MD.

## 2020-09-04 ENCOUNTER — Other Ambulatory Visit: Payer: Self-pay | Admitting: *Deleted

## 2020-10-15 ENCOUNTER — Other Ambulatory Visit: Payer: Self-pay

## 2020-10-15 ENCOUNTER — Ambulatory Visit: Payer: Medicaid Other | Admitting: *Deleted

## 2020-10-15 ENCOUNTER — Ambulatory Visit: Payer: Medicaid Other | Attending: Maternal & Fetal Medicine

## 2020-10-15 ENCOUNTER — Encounter: Payer: Self-pay | Admitting: *Deleted

## 2020-10-15 VITALS — BP 109/48 | HR 81

## 2020-10-15 DIAGNOSIS — O43102 Malformation of placenta, unspecified, second trimester: Secondary | ICD-10-CM

## 2020-10-15 DIAGNOSIS — O365920 Maternal care for other known or suspected poor fetal growth, second trimester, not applicable or unspecified: Secondary | ICD-10-CM

## 2020-10-15 DIAGNOSIS — Z8759 Personal history of other complications of pregnancy, childbirth and the puerperium: Secondary | ICD-10-CM

## 2020-10-15 DIAGNOSIS — Z3A27 27 weeks gestation of pregnancy: Secondary | ICD-10-CM

## 2020-10-15 DIAGNOSIS — O09292 Supervision of pregnancy with other poor reproductive or obstetric history, second trimester: Secondary | ICD-10-CM | POA: Diagnosis not present

## 2020-10-16 ENCOUNTER — Other Ambulatory Visit: Payer: Self-pay | Admitting: *Deleted

## 2020-10-16 DIAGNOSIS — O365920 Maternal care for other known or suspected poor fetal growth, second trimester, not applicable or unspecified: Secondary | ICD-10-CM

## 2020-11-12 ENCOUNTER — Ambulatory Visit: Payer: Medicaid Other

## 2020-11-24 ENCOUNTER — Ambulatory Visit: Payer: Medicaid Other | Attending: Maternal & Fetal Medicine | Admitting: *Deleted

## 2020-11-24 ENCOUNTER — Other Ambulatory Visit: Payer: Self-pay

## 2020-11-24 ENCOUNTER — Encounter: Payer: Self-pay | Admitting: *Deleted

## 2020-11-24 ENCOUNTER — Other Ambulatory Visit: Payer: Self-pay | Admitting: Maternal & Fetal Medicine

## 2020-11-24 ENCOUNTER — Ambulatory Visit (HOSPITAL_BASED_OUTPATIENT_CLINIC_OR_DEPARTMENT_OTHER): Payer: Medicaid Other

## 2020-11-24 VITALS — BP 111/56 | HR 96

## 2020-11-24 DIAGNOSIS — O365930 Maternal care for other known or suspected poor fetal growth, third trimester, not applicable or unspecified: Secondary | ICD-10-CM | POA: Insufficient documentation

## 2020-11-24 DIAGNOSIS — O365920 Maternal care for other known or suspected poor fetal growth, second trimester, not applicable or unspecified: Secondary | ICD-10-CM

## 2020-11-24 DIAGNOSIS — Z3A32 32 weeks gestation of pregnancy: Secondary | ICD-10-CM | POA: Insufficient documentation

## 2020-11-24 DIAGNOSIS — O09293 Supervision of pregnancy with other poor reproductive or obstetric history, third trimester: Secondary | ICD-10-CM | POA: Diagnosis not present

## 2020-11-25 ENCOUNTER — Other Ambulatory Visit: Payer: Self-pay | Admitting: *Deleted

## 2020-11-25 DIAGNOSIS — O09293 Supervision of pregnancy with other poor reproductive or obstetric history, third trimester: Secondary | ICD-10-CM | POA: Diagnosis not present

## 2020-11-25 DIAGNOSIS — Z3A32 32 weeks gestation of pregnancy: Secondary | ICD-10-CM

## 2020-11-25 DIAGNOSIS — O365930 Maternal care for other known or suspected poor fetal growth, third trimester, not applicable or unspecified: Secondary | ICD-10-CM

## 2020-11-25 DIAGNOSIS — O43103 Malformation of placenta, unspecified, third trimester: Secondary | ICD-10-CM | POA: Diagnosis not present

## 2020-12-04 ENCOUNTER — Ambulatory Visit: Payer: Medicaid Other | Admitting: *Deleted

## 2020-12-04 ENCOUNTER — Other Ambulatory Visit: Payer: Self-pay

## 2020-12-04 ENCOUNTER — Ambulatory Visit: Payer: Medicaid Other | Attending: Obstetrics

## 2020-12-04 VITALS — BP 103/50 | HR 108

## 2020-12-04 DIAGNOSIS — O365930 Maternal care for other known or suspected poor fetal growth, third trimester, not applicable or unspecified: Secondary | ICD-10-CM

## 2020-12-08 ENCOUNTER — Encounter (HOSPITAL_COMMUNITY): Payer: Self-pay

## 2020-12-08 ENCOUNTER — Inpatient Hospital Stay (HOSPITAL_COMMUNITY)
Admission: AD | Admit: 2020-12-08 | Discharge: 2020-12-08 | Disposition: A | Discharge status: Home / Self Care | Payer: Medicaid Other | Attending: Obstetrics and Gynecology | Admitting: Obstetrics and Gynecology

## 2020-12-08 DIAGNOSIS — Z5321 Procedure and treatment not carried out due to patient leaving prior to being seen by health care provider: Secondary | ICD-10-CM | POA: Insufficient documentation

## 2020-12-08 DIAGNOSIS — Z3A34 34 weeks gestation of pregnancy: Secondary | ICD-10-CM | POA: Insufficient documentation

## 2020-12-08 DIAGNOSIS — O26893 Other specified pregnancy related conditions, third trimester: Secondary | ICD-10-CM | POA: Insufficient documentation

## 2020-12-08 DIAGNOSIS — R030 Elevated blood-pressure reading, without diagnosis of hypertension: Secondary | ICD-10-CM | POA: Insufficient documentation

## 2020-12-08 DIAGNOSIS — Z79899 Other long term (current) drug therapy: Secondary | ICD-10-CM | POA: Diagnosis not present

## 2020-12-08 DIAGNOSIS — O219 Vomiting of pregnancy, unspecified: Secondary | ICD-10-CM

## 2020-12-08 DIAGNOSIS — Z87891 Personal history of nicotine dependence: Secondary | ICD-10-CM | POA: Diagnosis not present

## 2020-12-08 DIAGNOSIS — O212 Late vomiting of pregnancy: Secondary | ICD-10-CM | POA: Insufficient documentation

## 2020-12-08 DIAGNOSIS — F1210 Cannabis abuse, uncomplicated: Secondary | ICD-10-CM

## 2020-12-08 DIAGNOSIS — O163 Unspecified maternal hypertension, third trimester: Secondary | ICD-10-CM

## 2020-12-08 LAB — URINALYSIS, ROUTINE W REFLEX MICROSCOPIC
Bilirubin Urine: NEGATIVE
Glucose, UA: 50 mg/dL — AB
Hgb urine dipstick: NEGATIVE
Ketones, ur: 80 mg/dL — AB
Leukocytes,Ua: NEGATIVE
Nitrite: NEGATIVE
Protein, ur: 100 mg/dL — AB
Specific Gravity, Urine: 1.031 — ABNORMAL HIGH (ref 1.005–1.030)
pH: 5 (ref 5.0–8.0)

## 2020-12-08 LAB — CBC
HCT: 40.1 % (ref 36.0–46.0)
Hemoglobin: 13.5 g/dL (ref 12.0–15.0)
MCH: 34.2 pg — ABNORMAL HIGH (ref 26.0–34.0)
MCHC: 33.7 g/dL (ref 30.0–36.0)
MCV: 101.5 fL — ABNORMAL HIGH (ref 80.0–100.0)
Platelets: 236 10*3/uL (ref 150–400)
RBC: 3.95 MIL/uL (ref 3.87–5.11)
RDW: 12.5 % (ref 11.5–15.5)
WBC: 12.4 10*3/uL — ABNORMAL HIGH (ref 4.0–10.5)
nRBC: 0 % (ref 0.0–0.2)

## 2020-12-08 LAB — COMPREHENSIVE METABOLIC PANEL
ALT: 19 U/L (ref 0–44)
AST: 24 U/L (ref 15–41)
Albumin: 3 g/dL — ABNORMAL LOW (ref 3.5–5.0)
Alkaline Phosphatase: 154 U/L — ABNORMAL HIGH (ref 38–126)
Anion gap: 7 (ref 5–15)
BUN: 8 mg/dL (ref 6–20)
CO2: 23 mmol/L (ref 22–32)
Calcium: 9.1 mg/dL (ref 8.9–10.3)
Chloride: 107 mmol/L (ref 98–111)
Creatinine, Ser: 0.71 mg/dL (ref 0.44–1.00)
GFR, Estimated: >60 mL/min (ref >60)
Glucose, Bld: 76 mg/dL (ref 70–99)
Potassium: 3.5 mmol/L (ref 3.5–5.1)
Sodium: 137 mmol/L (ref 135–145)
Total Bilirubin: 0.6 mg/dL (ref 0.3–1.2)
Total Protein: 6.6 g/dL (ref 6.5–8.1)

## 2020-12-08 LAB — RAPID URINE DRUG SCREEN, HOSP PERFORMED
Amphetamines: NOT DETECTED
Barbiturates: NOT DETECTED
Benzodiazepines: NOT DETECTED
Cocaine: NOT DETECTED
Opiates: NOT DETECTED
Tetrahydrocannabinol: POSITIVE — AB

## 2020-12-08 LAB — LIPASE, BLOOD: Lipase: 25 U/L (ref 11–51)

## 2020-12-08 MED ORDER — SODIUM CHLORIDE 0.9 % IV SOLN
8.0000 mg | Freq: Once | INTRAVENOUS | Status: AC
  Administered 2020-12-08: 8 mg via INTRAVENOUS
  Filled 2020-12-08: qty 4

## 2020-12-08 MED ORDER — FAMOTIDINE IN NACL 20-0.9 MG/50ML-% IV SOLN
20.0000 mg | Freq: Once | INTRAVENOUS | Status: AC
  Administered 2020-12-08: 20 mg via INTRAVENOUS
  Filled 2020-12-08: qty 50

## 2020-12-08 MED ORDER — LACTATED RINGERS IV BOLUS
1000.0000 mL | Freq: Once | INTRAVENOUS | Status: AC
  Administered 2020-12-08: 1000 mL via INTRAVENOUS

## 2020-12-08 MED ORDER — SODIUM CHLORIDE 0.9 % IV SOLN
12.5000 mg | Freq: Once | INTRAVENOUS | Status: AC
  Administered 2020-12-08: 12.5 mg via INTRAVENOUS
  Filled 2020-12-08: qty 0.5

## 2020-12-08 NOTE — MAU Note (Signed)
Pt in wc.  States can not get urine at this time, has not  Kept down any fluids today. Asking for ice.

## 2020-12-08 NOTE — MAU Note (Signed)
Pt in bathroom

## 2020-12-08 NOTE — MAU Note (Signed)
Pt left AMA. [place] prior to pt leaving. Explained to pt concerns regarding vital signs and continued nausea and vomiting. Pt stated she has to go home and get her baby situated and then she will return but she feels that she will be more comfortable at home. "I have been managing my symptoms since I was 25 years old." CNM also reviewed hyperemesis cannabinoid syndrome. IV removed-site clear. Pts contractions remain irregular and mild. FHR tracing Category 1. Pt signed AMA consent signed.

## 2020-12-08 NOTE — MAU Provider Note (Incomplete Revision)
History     CSN: [id number]  Arrival date and time: 12/08/20 1824   Event Date/Time   First Provider Initiated Contact with Patient 12/08/20 1925      Chief Complaint  Patient presents with  . Emesis  . Nausea   25 y.o. G2P1001 @27 .5 wks presenting with N/V. Reports onset last night. Unable to tolerate anything po. Denies sick contacts or fever. Reports occasional ctx. Reports +FM. Used MJ this am.   OB History    Gravida  2   Para  1   Term  1   Preterm  0   AB  0   Living  1     SAB  0   IAB  0   Ectopic  0   Multiple  0   Live Births  1           Past Medical History:  Diagnosis Date  . Anxiety   . Arthritis   . Asthma   . Bipolar disease, manic (HCC)   . Cyclical vomiting syndrome   . Depression   . Dyspnea   . Headache   . Recurrent upper respiratory infection (URI)   . Schizophrenia [facility])     Past Surgical History:  Procedure Laterality Date  . TONSILLECTOMY      Family History  Problem Relation Age of Onset  . Hypertension Mother   . Seizures Maternal Uncle   . Breast cancer Paternal Aunt   . Kidney disease Maternal Grandmother     Social History   Tobacco Use  . Smoking status: Former Smoker    Packs/day: 0.00    Types: Cigarettes    Quit date: 10/08/2017    Years since quitting: 3.1  . Smokeless tobacco: Never Used  Vaping Use  . Vaping Use: Never used  Substance Use Topics  . Alcohol use: Not Currently    Alcohol/week: 14.0 standard drinks    Types: 14 Standard drinks or equivalent per week    Comment: daily  . Drug use: Yes    Types: Marijuana    Comment: 12/08/2020    Allergies: No Known Allergies  Medications Prior to Admission  Medication Sig Dispense Refill Last Dose  . doxylamine, Sleep, (UNISOM) 25 MG tablet Take 25 mg by mouth at bedtime as needed for sleep.     . promethazine (PHENERGAN) 25 MG tablet Take 1 tablet (25 mg total) by mouth every 6 (six) hours as needed for nausea or vomiting. 30 tablet  2 12/07/2020 at 2200  . albuterol (VENTOLIN HFA) 108 (90 Base) MCG/ACT inhaler Inhale 2 puffs into the lungs every 6 (six) hours as needed for wheezing or shortness of breath. (Patient not taking: Reported on 10/15/2020) 8 g 2   . famotidine (PEPCID) 20 MG tablet Take 1 tablet (20 mg total) by mouth 2 (two) times daily. (Patient not taking: Reported on 09/03/2020) 30 tablet 0   . metoCLOPramide (REGLAN) 10 MG tablet Take 1 tablet (10 mg total) by mouth every 6 (six) hours. (Patient not taking: Reported on 09/03/2020) 30 tablet 2   . ondansetron (ZOFRAN ODT) 4 MG disintegrating tablet Take 1 tablet (4 mg total) by mouth every 8 (eight) hours as needed for nausea or vomiting. (Patient not taking: Reported on 09/03/2020) 30 tablet 2   . Prenatal Vit-Fe Fumarate-FA (MULTIVITAMIN-PRENATAL) 27-0.8 MG TABS tablet Take 1 tablet by mouth daily at 12 noon. (Patient not taking: Reported on 09/03/2020) 30 tablet 11   . Prenatal Vit-Fe Fumarate-FA (PRENATAL  MULTIVITAMIN) TABS tablet Take 1 tablet by mouth daily at 12 noon.     Marland Kitchen scopolamine (TRANSDERM-SCOP, 1.5 MG,) 1 MG/3DAYS Place 1 patch (1.5 mg total) onto the skin every 3 (three) days. (Patient not taking: Reported on 09/03/2020) 10 patch 12     Review of Systems  Constitutional: Negative for chills and fever.  Gastrointestinal: Positive for diarrhea, nausea and vomiting. Negative for abdominal pain.  Genitourinary: Negative for vaginal bleeding and vaginal discharge.   Physical Exam   Blood pressure 137/83, pulse 92, temperature 97.8 F (36.6 C), temperature source Oral, resp. rate 18, weight 55.9 kg, last menstrual period 04/01/2020, SpO2 100 %.  Physical Exam Vitals and nursing note reviewed. Exam conducted with a chaperone present.  Constitutional:      General: She is not in acute distress.    Appearance: Normal appearance.  HENT:     Head: Normocephalic and atraumatic.  Cardiovascular:     Rate and Rhythm: Normal rate.  Pulmonary:     Effort:  Pulmonary effort is normal. No respiratory distress.  Abdominal:     Palpations: Abdomen is soft.     Tenderness: There is no abdominal tenderness.  Genitourinary:    Comments: VE: closed/thick Musculoskeletal:        General: Normal range of motion.     Cervical back: Normal range of motion.  Skin:    General: Skin is warm and dry.  Neurological:     General: No focal deficit present.     Mental Status: She is alert and oriented to person, place, and time.  Psychiatric:        Mood and Affect: Mood normal.        Behavior: Behavior normal.   EFM: 145 bpm, mod variability, + accels, no decels Toco: irregular  Results for orders placed or performed during the hospital encounter of 12/08/20 (from the past 24 hour(s))  CBC     Status: Abnormal   Collection Time: 12/08/20  7:54 PM  Result Value Ref Range   WBC 12.4 (H) 4.0 - 10.5 K/uL   RBC 3.95 3.87 - 5.11 MIL/uL   Hemoglobin 13.5 12.0 - 15.0 g/dL   HCT 53.6 14.4 - 31.5 %   MCV 101.5 (H) 80.0 - 100.0 fL   MCH 34.2 (H) 26.0 - 34.0 pg   MCHC 33.7 30.0 - 36.0 g/dL   RDW 40.0 86.7 - 61.9 %   Platelets 236 150 - 400 K/uL   nRBC 0.0 0.0 - 0.2 %    MAU Course  Procedures LR Zofran Pepcid  MDM Labs ordered. Transfer of care given to Lorenda Peck, CNM  12/08/2020 8:22 PM    Vitals:   12/08/20 1838 12/08/20 1854 12/08/20 1855  BP:  137/83   Pulse:  92   Resp:  18   Temp:  97.8 F (36.6 C)   TempSrc:  Oral   SpO2:   100%  Weight: 55.9 kg     Retching in bathroom Walking around room without syncope BP 143/90 Left AMA  Assessment and Plan

## 2020-12-08 NOTE — MAU Note (Signed)
Pt states sher ride is here and she must go

## 2020-12-08 NOTE — MAU Note (Signed)
Pt reports intractable nausea. Denies pain, contractions or cramping.Marland Kitchen

## 2020-12-08 NOTE — MAU Note (Signed)
Call x 2 unable to reach-call went to voice mail.

## 2020-12-08 NOTE — MAU Note (Signed)
Pt tolerating ice chips - LR bolus started with IV Zofran infusing

## 2020-12-08 NOTE — MAU Note (Signed)
Pt requesting to be discharged to home-states she will be more comfortable at home and feels that she can manage her nausea following the medications that she has received during her admission

## 2020-12-08 NOTE — MAU Provider Note (Addendum)
History     CSN: [id number]  Arrival date and time: 12/08/20 1824   Event Date/Time   First Provider Initiated Contact with Patient 12/08/20 1925      Chief Complaint  Patient presents with  . Emesis  . Nausea   25 y.o. G2P1001 @34 .5 wks presenting with N/V. Reports onset last night. Unable to tolerate anything po. Denies sick contacts or fever. Reports occasional ctx. Reports +FM. Used MJ this am.   OB History    Gravida  2   Para  1   Term  1   Preterm  0   AB  0   Living  1     SAB  0   IAB  0   Ectopic  0   Multiple  0   Live Births  1           Past Medical History:  Diagnosis Date  . Anxiety   . Arthritis   . Asthma   . Bipolar disease, manic (HCC)   . Cyclical vomiting syndrome   . Depression   . Dyspnea   . Headache   . Recurrent upper respiratory infection (URI)   . Schizophrenia [facility])     Past Surgical History:  Procedure Laterality Date  . TONSILLECTOMY      Family History  Problem Relation Age of Onset  . Hypertension Mother   . Seizures Maternal Uncle   . Breast cancer Paternal Aunt   . Kidney disease Maternal Grandmother     Social History   Tobacco Use  . Smoking status: Former Smoker    Packs/day: 0.00    Types: Cigarettes    Quit date: 10/08/2017    Years since quitting: 3.1  . Smokeless tobacco: Never Used  Vaping Use  . Vaping Use: Never used  Substance Use Topics  . Alcohol use: Not Currently    Alcohol/week: 14.0 standard drinks    Types: 14 Standard drinks or equivalent per week    Comment: daily  . Drug use: Yes    Types: Marijuana    Comment: 12/08/2020    Allergies: No Known Allergies  Medications Prior to Admission  Medication Sig Dispense Refill Last Dose  . doxylamine, Sleep, (UNISOM) 25 MG tablet Take 25 mg by mouth at bedtime as needed for sleep.     . promethazine (PHENERGAN) 25 MG tablet Take 1 tablet (25 mg total) by mouth every 6 (six) hours as needed for nausea or vomiting. 30 tablet  2 12/07/2020 at 2200  . albuterol (VENTOLIN HFA) 108 (90 Base) MCG/ACT inhaler Inhale 2 puffs into the lungs every 6 (six) hours as needed for wheezing or shortness of breath. (Patient not taking: Reported on 10/15/2020) 8 g 2   . famotidine (PEPCID) 20 MG tablet Take 1 tablet (20 mg total) by mouth 2 (two) times daily. (Patient not taking: Reported on 09/03/2020) 30 tablet 0   . metoCLOPramide (REGLAN) 10 MG tablet Take 1 tablet (10 mg total) by mouth every 6 (six) hours. (Patient not taking: Reported on 09/03/2020) 30 tablet 2   . ondansetron (ZOFRAN ODT) 4 MG disintegrating tablet Take 1 tablet (4 mg total) by mouth every 8 (eight) hours as needed for nausea or vomiting. (Patient not taking: Reported on 09/03/2020) 30 tablet 2   . Prenatal Vit-Fe Fumarate-FA (MULTIVITAMIN-PRENATAL) 27-0.8 MG TABS tablet Take 1 tablet by mouth daily at 12 noon. (Patient not taking: Reported on 09/03/2020) 30 tablet 11   . Prenatal Vit-Fe Fumarate-FA (PRENATAL  MULTIVITAMIN) TABS tablet Take 1 tablet by mouth daily at 12 noon.     Marland Kitchen scopolamine (TRANSDERM-SCOP, 1.5 MG,) 1 MG/3DAYS Place 1 patch (1.5 mg total) onto the skin every 3 (three) days. (Patient not taking: Reported on 09/03/2020) 10 patch 12     Review of Systems  Constitutional: Negative for chills and fever.  Gastrointestinal: Positive for diarrhea, nausea and vomiting. Negative for abdominal pain.  Genitourinary: Negative for vaginal bleeding and vaginal discharge.   Physical Exam   Blood pressure 137/83, pulse 92, temperature 97.8 F (36.6 C), temperature source Oral, resp. rate 18, weight 55.9 kg, last menstrual period 04/01/2020, SpO2 100 %.  Physical Exam Vitals and nursing note reviewed. Exam conducted with a chaperone present.  Constitutional:      General: She is not in acute distress.    Appearance: Normal appearance.  HENT:     Head: Normocephalic and atraumatic.  Cardiovascular:     Rate and Rhythm: Normal rate.  Pulmonary:     Effort:  Pulmonary effort is normal. No respiratory distress.  Abdominal:     Palpations: Abdomen is soft.     Tenderness: There is no abdominal tenderness.  Genitourinary:    Comments: VE: closed/thick Musculoskeletal:        General: Normal range of motion.     Cervical back: Normal range of motion.  Skin:    General: Skin is warm and dry.  Neurological:     General: No focal deficit present.     Mental Status: She is alert and oriented to person, place, and time.  Psychiatric:        Mood and Affect: Mood normal.        Behavior: Behavior normal.   EFM: 145 bpm, mod variability, + accels, no decels Toco: irregular  Results for orders placed or performed during the hospital encounter of 12/08/20 (from the past 24 hour(s))  CBC     Status: Abnormal   Collection Time: 12/08/20  7:54 PM  Result Value Ref Range   WBC 12.4 (H) 4.0 - 10.5 K/uL   RBC 3.95 3.87 - 5.11 MIL/uL   Hemoglobin 13.5 12.0 - 15.0 g/dL   HCT 34.7 42.5 - 95.6 %   MCV 101.5 (H) 80.0 - 100.0 fL   MCH 34.2 (H) 26.0 - 34.0 pg   MCHC 33.7 30.0 - 36.0 g/dL   RDW 38.7 56.4 - 33.2 %   Platelets 236 150 - 400 K/uL   nRBC 0.0 0.0 - 0.2 %    MAU Course  Procedures LR Zofran Pepcid  MDM Labs ordered. Transfer of care given to Lorenda Peck, CNM  12/08/2020 8:22 PM    Vitals:   12/08/20 1838 12/08/20 1854 12/08/20 1855  BP:  137/83   Pulse:  92   Resp:  18   Temp:  97.8 F (36.6 C)   TempSrc:  Oral   SpO2:   100%  Weight: 55.9 kg     Retching in bathroom  Walking around room without syncope States wants to go home and "be in my own place" BP 143/90  Discussed leaving now would be against medical advice, given that she is still vomiting and has new elevations in BP  (denies headache or visual change) Discussed this may represent preeclampsia and there is a risk to her and to the baby including seizures, compromised fetal blood flow.  She states she will come back "later".  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

## 2020-12-08 NOTE — MAU Note (Signed)
Pt returned from bathroom-external fetal and labor monitors reapplied. Pt asking for Phenergan-infusion began. Pt states small amounts of bile-mostly dry heaving. Pt remains restless. No episodes of diarrhea. Pt denies stomach pain or cramping - states she continues to have an occasional irregular contraction.

## 2020-12-09 NOTE — MAU Note (Signed)
External monitors removed-pt states she wants her discharge paperwork started-she needs to leave

## 2020-12-09 NOTE — MAU Note (Signed)
External monitors removed per pt-up to bathroom-returned at 2047-external monitors reapplied. FHR 140bpm

## 2020-12-11 ENCOUNTER — Ambulatory Visit: Payer: Medicaid Other | Attending: Obstetrics

## 2020-12-11 ENCOUNTER — Other Ambulatory Visit: Payer: Self-pay

## 2020-12-11 ENCOUNTER — Ambulatory Visit: Payer: Medicaid Other | Admitting: *Deleted

## 2020-12-11 ENCOUNTER — Other Ambulatory Visit: Payer: Self-pay | Admitting: Obstetrics & Gynecology

## 2020-12-11 ENCOUNTER — Encounter: Payer: Self-pay | Admitting: *Deleted

## 2020-12-11 VITALS — BP 116/54 | HR 87

## 2020-12-11 DIAGNOSIS — O365930 Maternal care for other known or suspected poor fetal growth, third trimester, not applicable or unspecified: Secondary | ICD-10-CM | POA: Diagnosis present

## 2020-12-18 ENCOUNTER — Ambulatory Visit: Payer: Medicaid Other | Admitting: *Deleted

## 2020-12-18 ENCOUNTER — Ambulatory Visit: Payer: Medicaid Other | Attending: Obstetrics

## 2020-12-18 ENCOUNTER — Other Ambulatory Visit: Payer: Self-pay

## 2020-12-18 ENCOUNTER — Telehealth (HOSPITAL_COMMUNITY): Payer: Self-pay | Admitting: *Deleted

## 2020-12-18 ENCOUNTER — Encounter: Payer: Self-pay | Admitting: *Deleted

## 2020-12-18 VITALS — BP 99/53 | HR 96

## 2020-12-18 DIAGNOSIS — O365930 Maternal care for other known or suspected poor fetal growth, third trimester, not applicable or unspecified: Secondary | ICD-10-CM | POA: Diagnosis present

## 2020-12-18 DIAGNOSIS — Z3A36 36 weeks gestation of pregnancy: Secondary | ICD-10-CM | POA: Diagnosis not present

## 2020-12-18 DIAGNOSIS — O43103 Malformation of placenta, unspecified, third trimester: Secondary | ICD-10-CM

## 2020-12-18 DIAGNOSIS — O09293 Supervision of pregnancy with other poor reproductive or obstetric history, third trimester: Secondary | ICD-10-CM

## 2020-12-18 NOTE — Telephone Encounter (Signed)
Preadmission screen  

## 2020-12-19 ENCOUNTER — Telehealth (HOSPITAL_COMMUNITY): Payer: Self-pay | Admitting: *Deleted

## 2020-12-19 ENCOUNTER — Encounter (HOSPITAL_COMMUNITY): Payer: Self-pay | Admitting: *Deleted

## 2020-12-19 NOTE — Telephone Encounter (Signed)
Preadmission screen  

## 2020-12-22 ENCOUNTER — Other Ambulatory Visit (HOSPITAL_COMMUNITY): Payer: Medicaid Other | Attending: Obstetrics & Gynecology

## 2020-12-24 ENCOUNTER — Inpatient Hospital Stay (HOSPITAL_COMMUNITY): Payer: Medicaid Other | Admitting: Anesthesiology

## 2020-12-24 ENCOUNTER — Encounter (HOSPITAL_COMMUNITY)
Admission: AD | Disposition: A | Discharge status: Home / Self Care | Payer: Self-pay | Source: Home / Self Care | Attending: Obstetrics & Gynecology

## 2020-12-24 ENCOUNTER — Other Ambulatory Visit: Payer: Self-pay

## 2020-12-24 ENCOUNTER — Encounter (HOSPITAL_COMMUNITY): Payer: Self-pay | Admitting: Obstetrics & Gynecology

## 2020-12-24 ENCOUNTER — Inpatient Hospital Stay (HOSPITAL_COMMUNITY)
Admission: AD | Admit: 2020-12-24 | Discharge: 2020-12-27 | DRG: 787 | Disposition: A | Discharge status: Home / Self Care | Payer: Medicaid Other | Attending: Obstetrics & Gynecology | Admitting: Obstetrics & Gynecology

## 2020-12-24 ENCOUNTER — Inpatient Hospital Stay (HOSPITAL_COMMUNITY): Payer: Medicaid Other

## 2020-12-24 DIAGNOSIS — Z87891 Personal history of nicotine dependence: Secondary | ICD-10-CM | POA: Diagnosis not present

## 2020-12-24 DIAGNOSIS — F259 Schizoaffective disorder, unspecified: Secondary | ICD-10-CM | POA: Diagnosis present

## 2020-12-24 DIAGNOSIS — O9081 Anemia of the puerperium: Secondary | ICD-10-CM | POA: Diagnosis not present

## 2020-12-24 DIAGNOSIS — D62 Acute posthemorrhagic anemia: Secondary | ICD-10-CM | POA: Diagnosis not present

## 2020-12-24 DIAGNOSIS — F1290 Cannabis use, unspecified, uncomplicated: Secondary | ICD-10-CM | POA: Diagnosis present

## 2020-12-24 DIAGNOSIS — O9952 Diseases of the respiratory system complicating childbirth: Secondary | ICD-10-CM | POA: Diagnosis present

## 2020-12-24 DIAGNOSIS — J45909 Unspecified asthma, uncomplicated: Secondary | ICD-10-CM | POA: Diagnosis present

## 2020-12-24 DIAGNOSIS — Z79899 Other long term (current) drug therapy: Secondary | ICD-10-CM | POA: Diagnosis not present

## 2020-12-24 DIAGNOSIS — D509 Iron deficiency anemia, unspecified: Secondary | ICD-10-CM | POA: Diagnosis present

## 2020-12-24 DIAGNOSIS — O365930 Maternal care for other known or suspected poor fetal growth, third trimester, not applicable or unspecified: Principal | ICD-10-CM | POA: Diagnosis present

## 2020-12-24 DIAGNOSIS — O99344 Other mental disorders complicating childbirth: Secondary | ICD-10-CM | POA: Diagnosis present

## 2020-12-24 DIAGNOSIS — F419 Anxiety disorder, unspecified: Secondary | ICD-10-CM | POA: Diagnosis present

## 2020-12-24 DIAGNOSIS — Z3A37 37 weeks gestation of pregnancy: Secondary | ICD-10-CM

## 2020-12-24 DIAGNOSIS — Z8659 Personal history of other mental and behavioral disorders: Secondary | ICD-10-CM

## 2020-12-24 DIAGNOSIS — O99324 Drug use complicating childbirth: Secondary | ICD-10-CM | POA: Diagnosis present

## 2020-12-24 DIAGNOSIS — Z3490 Encounter for supervision of normal pregnancy, unspecified, unspecified trimester: Secondary | ICD-10-CM | POA: Diagnosis present

## 2020-12-24 DIAGNOSIS — F319 Bipolar disorder, unspecified: Secondary | ICD-10-CM | POA: Diagnosis present

## 2020-12-24 DIAGNOSIS — Z20822 Contact with and (suspected) exposure to covid-19: Secondary | ICD-10-CM | POA: Diagnosis present

## 2020-12-24 DIAGNOSIS — O365990 Maternal care for other known or suspected poor fetal growth, unspecified trimester, not applicable or unspecified: Secondary | ICD-10-CM | POA: Diagnosis present

## 2020-12-24 LAB — CBC
HCT: 31 % — ABNORMAL LOW (ref 36.0–46.0)
Hemoglobin: 10.4 g/dL — ABNORMAL LOW (ref 12.0–15.0)
MCH: 34.1 pg — ABNORMAL HIGH (ref 26.0–34.0)
MCHC: 33.5 g/dL (ref 30.0–36.0)
MCV: 101.6 fL — ABNORMAL HIGH (ref 80.0–100.0)
Platelets: 227 10*3/uL (ref 150–400)
RBC: 3.05 MIL/uL — ABNORMAL LOW (ref 3.87–5.11)
RDW: 12.5 % (ref 11.5–15.5)
WBC: 8.5 10*3/uL (ref 4.0–10.5)
nRBC: 0 % (ref 0.0–0.2)

## 2020-12-24 LAB — TYPE AND SCREEN
ABO/RH(D): A POS
Antibody Screen: NEGATIVE

## 2020-12-24 LAB — RESP PANEL BY RT-PCR (FLU A&B, COVID) ARPGX2
Influenza A by PCR: NEGATIVE
Influenza B by PCR: NEGATIVE
SARS Coronavirus 2 by RT PCR: NEGATIVE

## 2020-12-24 LAB — RPR: RPR Ser Ql: NONREACTIVE

## 2020-12-24 SURGERY — Surgical Case
Anesthesia: Regional

## 2020-12-24 MED ORDER — OXYCODONE HCL 5 MG/5ML PO SOLN: 5.0000 mg | Freq: Once | ORAL | Status: DC | PRN

## 2020-12-24 MED ORDER — PRENATAL MULTIVITAMIN CH
1.0000 | ORAL_TABLET | Freq: Every day | ORAL | Status: DC
  Administered 2020-12-25 – 2020-12-27 (×3): 1 via ORAL
  Filled 2020-12-24 (×3): qty 1

## 2020-12-24 MED ORDER — SIMETHICONE 80 MG PO CHEW
80.0000 mg | CHEWABLE_TABLET | ORAL | Status: DC | PRN
  Administered 2020-12-26: 80 mg via ORAL
  Filled 2020-12-24: qty 1

## 2020-12-24 MED ORDER — LACTATED RINGERS IV SOLN: INTRAVENOUS | Status: DC

## 2020-12-24 MED ORDER — ACETAMINOPHEN 500 MG PO TABS
1000.0000 mg | ORAL_TABLET | Freq: Four times a day (QID) | ORAL | Status: DC
  Administered 2020-12-24 – 2020-12-25 (×2): 1000 mg via ORAL
  Filled 2020-12-24 (×2): qty 2

## 2020-12-24 MED ORDER — OXYTOCIN-SODIUM CHLORIDE 30-0.9 UT/500ML-% IV SOLN
INTRAVENOUS | Status: AC
  Filled 2020-12-24: qty 500

## 2020-12-24 MED ORDER — DEXAMETHASONE SODIUM PHOSPHATE 10 MG/ML IJ SOLN
INTRAMUSCULAR | Status: DC | PRN
  Administered 2020-12-24: 10 mg via INTRAVENOUS

## 2020-12-24 MED ORDER — ONDANSETRON HCL 4 MG/2ML IJ SOLN
INTRAMUSCULAR | Status: AC
  Filled 2020-12-24: qty 2

## 2020-12-24 MED ORDER — HYDROMORPHONE HCL 1 MG/ML IJ SOLN
INTRAMUSCULAR | Status: AC
  Filled 2020-12-24: qty 0.5

## 2020-12-24 MED ORDER — PROPOFOL 10 MG/ML IV BOLUS
INTRAVENOUS | Status: DC | PRN
  Administered 2020-12-24: 200 mg via INTRAVENOUS

## 2020-12-24 MED ORDER — OXYTOCIN BOLUS FROM INFUSION: 333.0000 mL | Freq: Once | INTRAVENOUS | Status: DC

## 2020-12-24 MED ORDER — LACTATED RINGERS IV SOLN: INTRAVENOUS | Status: DC | PRN

## 2020-12-24 MED ORDER — SODIUM CHLORIDE 0.9 % IR SOLN
Status: DC | PRN
  Administered 2020-12-24: 1

## 2020-12-24 MED ORDER — LIDOCAINE HCL (PF) 1 % IJ SOLN: 30.0000 mL | INTRAMUSCULAR | Status: DC | PRN

## 2020-12-24 MED ORDER — MIDAZOLAM HCL (PF) 5 MG/ML IJ SOLN: 0.5000 mg | INTRAMUSCULAR | Status: DC

## 2020-12-24 MED ORDER — CEFAZOLIN SODIUM-DEXTROSE 2-4 GM/100ML-% IV SOLN
INTRAVENOUS | Status: AC
  Filled 2020-12-24: qty 100

## 2020-12-24 MED ORDER — FENTANYL CITRATE (PF) 100 MCG/2ML IJ SOLN
INTRAMUSCULAR | Status: DC | PRN
  Administered 2020-12-24: 50 ug via INTRAVENOUS
  Administered 2020-12-24 (×4): 100 ug via INTRAVENOUS
  Administered 2020-12-24: 50 ug via INTRAVENOUS

## 2020-12-24 MED ORDER — ZOLPIDEM TARTRATE 5 MG PO TABS
5.0000 mg | ORAL_TABLET | Freq: Every evening | ORAL | Status: DC | PRN
  Administered 2020-12-25: 5 mg via ORAL
  Filled 2020-12-24: qty 1

## 2020-12-24 MED ORDER — DIPHENHYDRAMINE HCL 25 MG PO CAPS: 25.0000 mg | ORAL_CAPSULE | Freq: Four times a day (QID) | ORAL | Status: DC | PRN

## 2020-12-24 MED ORDER — METHYLERGONOVINE MALEATE 0.2 MG/ML IJ SOLN
INTRAMUSCULAR | Status: DC | PRN
  Administered 2020-12-24: .2 mg via INTRAMUSCULAR

## 2020-12-24 MED ORDER — FENTANYL CITRATE (PF) 100 MCG/2ML IJ SOLN
50.0000 ug | INTRAMUSCULAR | Status: DC | PRN
  Administered 2020-12-24 (×4): 100 ug via INTRAVENOUS
  Filled 2020-12-24 (×4): qty 2

## 2020-12-24 MED ORDER — MIDAZOLAM HCL 2 MG/2ML IJ SOLN
0.5000 mg | INTRAMUSCULAR | Status: DC
  Administered 2020-12-24: 0.5 mg via INTRAVENOUS

## 2020-12-24 MED ORDER — SUCCINYLCHOLINE CHLORIDE 20 MG/ML IJ SOLN
INTRAMUSCULAR | Status: DC | PRN
  Administered 2020-12-24: 80 mg via INTRAVENOUS

## 2020-12-24 MED ORDER — IBUPROFEN 600 MG PO TABS: 600.0000 mg | ORAL_TABLET | Freq: Four times a day (QID) | ORAL | Status: DC

## 2020-12-24 MED ORDER — FENTANYL CITRATE (PF) 100 MCG/2ML IJ SOLN
100.0000 ug | Freq: Once | INTRAMUSCULAR | Status: AC
  Administered 2020-12-24: 100 ug via INTRAVENOUS
  Filled 2020-12-24: qty 2

## 2020-12-24 MED ORDER — METHYLERGONOVINE MALEATE 0.2 MG/ML IJ SOLN
INTRAMUSCULAR | Status: AC
  Filled 2020-12-24: qty 1

## 2020-12-24 MED ORDER — TETANUS-DIPHTH-ACELL PERTUSSIS 5-2.5-18.5 LF-MCG/0.5 IM SUSY: 0.5000 mL | PREFILLED_SYRINGE | Freq: Once | INTRAMUSCULAR | Status: DC

## 2020-12-24 MED ORDER — STERILE WATER FOR IRRIGATION IR SOLN
Status: DC | PRN
  Administered 2020-12-24: 1000 mL

## 2020-12-24 MED ORDER — ONDANSETRON HCL 4 MG/2ML IJ SOLN
INTRAMUSCULAR | Status: DC | PRN
  Administered 2020-12-24: 4 mg via INTRAVENOUS

## 2020-12-24 MED ORDER — ACETAMINOPHEN 10 MG/ML IV SOLN
INTRAVENOUS | Status: AC
  Filled 2020-12-24: qty 100

## 2020-12-24 MED ORDER — TERBUTALINE SULFATE 1 MG/ML IJ SOLN: 0.2500 mg | Freq: Once | INTRAMUSCULAR | Status: DC | PRN

## 2020-12-24 MED ORDER — MISOPROSTOL 25 MCG QUARTER TABLET
25.0000 ug | ORAL_TABLET | ORAL | Status: DC | PRN
  Administered 2020-12-24: 25 ug via VAGINAL
  Filled 2020-12-24: qty 1

## 2020-12-24 MED ORDER — KETOROLAC TROMETHAMINE 30 MG/ML IJ SOLN
30.0000 mg | Freq: Four times a day (QID) | INTRAMUSCULAR | Status: AC
  Administered 2020-12-24 – 2020-12-25 (×4): 30 mg via INTRAVENOUS
  Filled 2020-12-24 (×4): qty 1

## 2020-12-24 MED ORDER — FENTANYL CITRATE (PF) 250 MCG/5ML IJ SOLN
INTRAMUSCULAR | Status: AC
  Filled 2020-12-24: qty 5

## 2020-12-24 MED ORDER — ACETAMINOPHEN 10 MG/ML IV SOLN
1000.0000 mg | Freq: Once | INTRAVENOUS | Status: DC | PRN
  Administered 2020-12-24: 1000 mg via INTRAVENOUS

## 2020-12-24 MED ORDER — PROMETHAZINE HCL 25 MG/ML IJ SOLN: 6.2500 mg | INTRAMUSCULAR | Status: DC | PRN

## 2020-12-24 MED ORDER — SOD CITRATE-CITRIC ACID 500-334 MG/5ML PO SOLN
30.0000 mL | ORAL | Status: DC | PRN
  Administered 2020-12-24: 30 mL via ORAL
  Filled 2020-12-24: qty 15

## 2020-12-24 MED ORDER — SIMETHICONE 80 MG PO CHEW
80.0000 mg | CHEWABLE_TABLET | Freq: Three times a day (TID) | ORAL | Status: DC
  Administered 2020-12-25 – 2020-12-27 (×7): 80 mg via ORAL
  Filled 2020-12-24 (×8): qty 1

## 2020-12-24 MED ORDER — DEXAMETHASONE SODIUM PHOSPHATE 10 MG/ML IJ SOLN
INTRAMUSCULAR | Status: AC
  Filled 2020-12-24: qty 1

## 2020-12-24 MED ORDER — MENTHOL 3 MG MT LOZG: 1.0000 | LOZENGE | OROMUCOSAL | Status: DC | PRN

## 2020-12-24 MED ORDER — DIBUCAINE (PERIANAL) 1 % EX OINT: 1.0000 "application " | TOPICAL_OINTMENT | CUTANEOUS | Status: DC | PRN

## 2020-12-24 MED ORDER — LACTATED RINGERS IV SOLN
500.0000 mL | INTRAVENOUS | Status: DC | PRN
  Administered 2020-12-24 (×2): 500 mL via INTRAVENOUS

## 2020-12-24 MED ORDER — OXYTOCIN-SODIUM CHLORIDE 30-0.9 UT/500ML-% IV SOLN
2.5000 [IU]/h | INTRAVENOUS | Status: AC
  Administered 2020-12-24: 2.5 [IU]/h via INTRAVENOUS
  Filled 2020-12-24: qty 500

## 2020-12-24 MED ORDER — OXYTOCIN-SODIUM CHLORIDE 30-0.9 UT/500ML-% IV SOLN
1.0000 m[IU]/min | INTRAVENOUS | Status: DC
  Administered 2020-12-24: 2 m[IU]/min via INTRAVENOUS

## 2020-12-24 MED ORDER — MIDAZOLAM HCL 2 MG/2ML IJ SOLN
INTRAMUSCULAR | Status: AC
  Filled 2020-12-24: qty 2

## 2020-12-24 MED ORDER — CEFAZOLIN SODIUM-DEXTROSE 2-3 GM-%(50ML) IV SOLR
INTRAVENOUS | Status: DC | PRN
  Administered 2020-12-24: 2 g via INTRAVENOUS

## 2020-12-24 MED ORDER — OXYTOCIN-SODIUM CHLORIDE 30-0.9 UT/500ML-% IV SOLN
2.5000 [IU]/h | INTRAVENOUS | Status: DC
  Administered 2020-12-24: 30 [IU] via INTRAVENOUS
  Filled 2020-12-24: qty 500

## 2020-12-24 MED ORDER — SUCCINYLCHOLINE CHLORIDE 200 MG/10ML IV SOSY
PREFILLED_SYRINGE | INTRAVENOUS | Status: AC
  Filled 2020-12-24: qty 10

## 2020-12-24 MED ORDER — SODIUM CHLORIDE 0.9 % IV SOLN: INTRAVENOUS | Status: DC | PRN

## 2020-12-24 MED ORDER — PROMETHAZINE HCL 25 MG/ML IJ SOLN
25.0000 mg | Freq: Once | INTRAVENOUS | Status: AC
  Administered 2020-12-24: 25 mg via INTRAVENOUS
  Filled 2020-12-24: qty 1

## 2020-12-24 MED ORDER — PROMETHAZINE HCL 25 MG/ML IJ SOLN
INTRAMUSCULAR | Status: AC
  Filled 2020-12-24: qty 1

## 2020-12-24 MED ORDER — ONDANSETRON HCL 4 MG/2ML IJ SOLN
4.0000 mg | Freq: Four times a day (QID) | INTRAMUSCULAR | Status: DC | PRN
  Administered 2020-12-24: 4 mg via INTRAVENOUS
  Filled 2020-12-24 (×2): qty 2

## 2020-12-24 MED ORDER — OXYCODONE HCL 5 MG PO TABS: 5.0000 mg | ORAL_TABLET | Freq: Once | ORAL | Status: DC | PRN

## 2020-12-24 MED ORDER — ACETAMINOPHEN 325 MG PO TABS: 650.0000 mg | ORAL_TABLET | ORAL | Status: DC | PRN

## 2020-12-24 MED ORDER — SENNOSIDES-DOCUSATE SODIUM 8.6-50 MG PO TABS
2.0000 | ORAL_TABLET | Freq: Every day | ORAL | Status: DC
  Administered 2020-12-25 – 2020-12-27 (×3): 2 via ORAL
  Filled 2020-12-24 (×3): qty 2

## 2020-12-24 MED ORDER — OXYCODONE HCL 5 MG PO TABS
5.0000 mg | ORAL_TABLET | ORAL | Status: DC | PRN
  Administered 2020-12-24 – 2020-12-27 (×11): 10 mg via ORAL
  Filled 2020-12-24 (×13): qty 2

## 2020-12-24 MED ORDER — WITCH HAZEL-GLYCERIN EX PADS: 1.0000 "application " | MEDICATED_PAD | CUTANEOUS | Status: DC | PRN

## 2020-12-24 MED ORDER — PROPOFOL 10 MG/ML IV BOLUS
INTRAVENOUS | Status: AC
  Filled 2020-12-24: qty 20

## 2020-12-24 MED ORDER — COCONUT OIL OIL: 1.0000 "application " | TOPICAL_OIL | Status: DC | PRN

## 2020-12-24 MED ORDER — HYDROMORPHONE HCL 1 MG/ML IJ SOLN
0.2500 mg | INTRAMUSCULAR | Status: DC | PRN
  Administered 2020-12-24 (×4): 0.5 mg via INTRAVENOUS

## 2020-12-24 SURGICAL SUPPLY — 45 items
ADH SKN CLS APL DERMABOND .7 (GAUZE/BANDAGES/DRESSINGS)
ADH SKN CLS LQ APL DERMABOND (GAUZE/BANDAGES/DRESSINGS) ×1
APL SKNCLS STERI-STRIP NONHPOA (GAUZE/BANDAGES/DRESSINGS) ×1
BENZOIN TINCTURE PRP APPL 2/3 (GAUZE/BANDAGES/DRESSINGS) ×1 IMPLANT
CHLORAPREP W/TINT 26ML (MISCELLANEOUS) ×2 IMPLANT
CLAMP CORD UMBIL (MISCELLANEOUS) IMPLANT
CLOSURE STERI STRIP 1/2 X4 (GAUZE/BANDAGES/DRESSINGS) ×1 IMPLANT
CLOTH BEACON ORANGE TIMEOUT ST (SAFETY) ×2 IMPLANT
DERMABOND ADHESIVE PROPEN (GAUZE/BANDAGES/DRESSINGS) ×1
DERMABOND ADVANCED (GAUZE/BANDAGES/DRESSINGS)
DERMABOND ADVANCED .7 DNX12 (GAUZE/BANDAGES/DRESSINGS) IMPLANT
DERMABOND ADVANCED .7 DNX6 (GAUZE/BANDAGES/DRESSINGS) IMPLANT
DRAPE C SECTION CLR SCREEN (DRAPES) ×2 IMPLANT
DRSG OPSITE POSTOP 4X10 (GAUZE/BANDAGES/DRESSINGS) ×2 IMPLANT
ELECT REM PT RETURN 9FT ADLT (ELECTROSURGICAL) ×2
ELECTRODE REM PT RTRN 9FT ADLT (ELECTROSURGICAL) ×1 IMPLANT
EXTRACTOR VACUUM M CUP 4 TUBE (SUCTIONS) IMPLANT
GLOVE BIOGEL PI IND STRL 7.0 (GLOVE) ×2 IMPLANT
GLOVE BIOGEL PI INDICATOR 7.0 (GLOVE) ×2
GLOVE SURG SS PI 6.5 STRL IVOR (GLOVE) ×2 IMPLANT
GOWN STRL REUS W/TWL LRG LVL3 (GOWN DISPOSABLE) ×4 IMPLANT
KIT ABG SYR 3ML LUER SLIP (SYRINGE) IMPLANT
NDL HYPO 25X5/8 SAFETYGLIDE (NEEDLE) IMPLANT
NEEDLE HYPO 25X5/8 SAFETYGLIDE (NEEDLE) IMPLANT
NS IRRIG 1000ML POUR BTL (IV SOLUTION) ×2 IMPLANT
PACK C SECTION WH (CUSTOM PROCEDURE TRAY) ×2 IMPLANT
PAD ABD 7.5X8 STRL (GAUZE/BANDAGES/DRESSINGS) ×1 IMPLANT
PAD OB MATERNITY 4.3X12.25 (PERSONAL CARE ITEMS) ×2 IMPLANT
PENCIL SMOKE EVAC W/HOLSTER (ELECTROSURGICAL) ×2 IMPLANT
RTRCTR C-SECT PINK 25CM LRG (MISCELLANEOUS) ×2 IMPLANT
SPONGE GAUZE 4X4 12PLY STER LF (GAUZE/BANDAGES/DRESSINGS) ×1 IMPLANT
SUT CHROMIC 1 CTX 36 (SUTURE) IMPLANT
SUT CHROMIC 2 0 CT 1 (SUTURE) ×2 IMPLANT
SUT MON AB 4-0 PS1 27 (SUTURE) ×2 IMPLANT
SUT PLAIN 1 NONE 54 (SUTURE) IMPLANT
SUT PLAIN 2 0 (SUTURE)
SUT PLAIN 2 0 XLH (SUTURE) IMPLANT
SUT PLAIN ABS 2-0 CT1 27XMFL (SUTURE) IMPLANT
SUT VIC AB 0 CTX 36 (SUTURE) ×2
SUT VIC AB 0 CTX36XBRD ANBCTRL (SUTURE) ×1 IMPLANT
SUT VIC AB 1 CTX 36 (SUTURE) ×4
SUT VIC AB 1 CTX36XBRD ANBCTRL (SUTURE) ×2 IMPLANT
TOWEL OR 17X24 6PK STRL BLUE (TOWEL DISPOSABLE) ×2 IMPLANT
TRAY FOLEY W/BAG SLVR 14FR LF (SET/KITS/TRAYS/PACK) ×2 IMPLANT
WATER STERILE IRR 1000ML POUR (IV SOLUTION) ×2 IMPLANT

## 2020-12-24 NOTE — Anesthesia Procedure Notes (Addendum)
Procedure Name: Intubation Date/Time: 12/24/2020 4:22 PM Performed by: Rica Records, CRNA Pre-anesthesia Checklist: Patient identified, Emergency Drugs available and Suction available Patient Re-evaluated:Patient Re-evaluated prior to induction Oxygen Delivery Method: Circle system utilized Preoxygenation: Pre-oxygenation with 100% oxygen Induction Type: Rapid sequence, Cricoid Pressure applied and IV induction Laryngoscope Size: Glidescope and 3 Grade View: Grade I Tube type: Oral Tube size: 7.0 mm Number of attempts: 1 Airway Equipment and Method: Video-laryngoscopy Placement Confirmation: ETT inserted through vocal cords under direct vision,  positive ETCO2 and breath sounds checked- equal and bilateral Secured at: 22 cm Tube secured with: Tape Dental Injury: Teeth and Oropharynx as per pre-operative assessment

## 2020-12-24 NOTE — Anesthesia Preprocedure Evaluation (Signed)
Anesthesia Evaluation   Patient awake  Preop documentation limited or incomplete due to emergent nature of procedure.  History of Anesthesia Complications Negative for: history of anesthetic complications  Airway        Dental   Pulmonary former smoker,           Cardiovascular  Rhythm:regular Rate:Normal     Neuro/Psych    GI/Hepatic   Endo/Other    Renal/GU      Musculoskeletal   Abdominal   Peds  Hematology   Anesthesia Other Findings   Reproductive/Obstetrics (+) Pregnancy G2P1001 at [redacted]w[redacted]d                            Anesthesia Physical Anesthesia Plan  ASA: III and emergent  Anesthesia Plan: General ETT, Rapid Sequence and Cricoid Pressure   Post-op Pain Management:    Induction:   PONV Risk Score and Plan: Treatment may vary due to age or medical condition, Ondansetron, Dexamethasone and Midazolam  Airway Management Planned:   Additional Equipment:   Intra-op Plan:   Post-operative Plan:   Informed Consent:     Only emergency history available  Plan Discussed with:   Anesthesia Plan Comments:         Anesthesia Quick Evaluation

## 2020-12-24 NOTE — Transfer of Care (Signed)
Immediate Anesthesia Transfer of Care Note  Patient: Angela Christian  Procedure(s) Performed: CESAREAN SECTION (N/A )  Patient Location: PACU  Anesthesia Type:General  Level of Consciousness: awake, alert  and oriented  Airway & Oxygen Therapy: Patient Spontanous Breathing and Patient connected to nasal cannula oxygen  Post-op Assessment: Report given to RN and Post -op Vital signs reviewed and stable  Post vital signs: Reviewed and stable  Last Vitals:  Vitals Value Taken Time  BP 143/83 12/24/20 1725  Temp    Pulse 65 12/24/20 1729  Resp 17 12/24/20 1729  SpO2 100 % 12/24/20 1729  Vitals shown include unvalidated device data.  Last Pain:  Vitals:   12/24/20 1201  TempSrc: Oral  PainSc: 5          Complications: No complications documented.

## 2020-12-24 NOTE — Progress Notes (Signed)
Angela Christian is a G2P1001 at [redacted]w[redacted]d   I was called to the room emergently due to cord prolapse, RN Salley Hews with hand elevating fetal head. FHR was in the 90s at the time I entered and increased to the max 105 while in the room. I discussed the need for C-section for safe delivery. Code Cesarean was called  The risks of cesarean section were discussed with the patient including but were not limited to: bleeding which may require transfusion or reoperation; infection which may require antibiotics; injury to bowel, bladder, ureters or other surrounding organs; injury to the fetus; need for additional procedures including hysterectomy in the event of a life-threatening hemorrhage; placental abnormalities wth subsequent pregnancies, incisional problems, thromboembolic phenomenon and other postoperative/anesthesia complications.  Anesthesia and OR aware.  Preoperative prophylactic antibiotics and SCDs ordered on call to the OR.  To OR when ready.  I escorted the patient to the OR with the team.   Federico Flake, MD, MPH, ABFM, [facility] Attending Physician Center for [facility]

## 2020-12-24 NOTE — Anesthesia Postprocedure Evaluation (Signed)
Anesthesia Post Note  Patient: Angela Christian  Procedure(s) Performed: CESAREAN SECTION (N/A )     Patient location during evaluation: PACU Level of consciousness: awake and alert Pain management: pain level controlled Vital Signs Assessment: post-procedure vital signs reviewed and stable Respiratory status: spontaneous breathing, nonlabored ventilation and respiratory function stable Cardiovascular status: blood pressure returned to baseline and stable Postop Assessment: no apparent nausea or vomiting Anesthetic complications: no   No complications documented.  Last Vitals:  Vitals:   12/24/20 1316 12/24/20 1545  BP: 120/72 122/64  Pulse: (!) 102 71  Resp: 16 16  Temp:      Last Pain:  Vitals:   12/24/20 1201  TempSrc: Oral  PainSc: 5    Pain Goal:                   Lucretia Kern

## 2020-12-24 NOTE — Progress Notes (Signed)
Angela Christian is a 25 y.o. G2P1001 at [redacted]w[redacted]d admitted for induction of labor due to IUGR. EFW < 1%.  Subjective:  Angela Christian is coping well with ctxs. Requesting all natural labor. Discussed AROM and pitocin with pt. Pt requesting pitocin now and to hold off on AROM until active labor. R/B/A discussed.   Objective: BP (!) 104/54   Pulse (!) 59   Temp 98 F (36.7 C) (Oral)   Resp 16   Ht 5' 4.5" (1.638 m)   Wt 62 kg   LMP 04/01/2020   BMI 23.09 kg/m  No intake/output data recorded. No intake/output data recorded.  FHT:  FHR: 125 bpm, variability: moderate,  accelerations:  Present,  decelerations:  Absent UC:   irregular, every 1.5-5 minutes SVE: 4/80/-2  Labs: Lab Results  Component Value Date   WBC 8.5 12/24/2020   HGB 10.4 (L) 12/24/2020   HCT 31.0 (L) 12/24/2020   MCV 101.6 (H) 12/24/2020   PLT 227 12/24/2020    Assessment / Plan: IOL for IUGR. Cytotec at 0130. Will start pitocin. Titrate 2x2.  Labor: Progressing normally, Starting pitocin. Will AROM once in active labor. Fetal Wellbeing:  Category I Pain Control:  Labor support without medications I/D:  GBS negative Anticipated MOD:  NSVD  Dr Sallye Ober aware of pt status and POC.  Gerhard Munch Junie Avilla MSN, CNM 12/24/2020, 9:56 AM

## 2020-12-24 NOTE — H&P (Signed)
OB ADMISSION/ HISTORY & PHYSICAL:  Admission Date: 12/24/2020 12:05 AM  Admit Diagnosis: IOL for SGA  GRISELDA BRAMBLETT is a 25 y.o. female G2P1001 [redacted]w[redacted]d presenting for IOL. EFW <1%, hx of prev SGA infant in 2019, and MFM recommended delivery by 37 wks. Endorses active FM, denies LOF and vaginal bleeding.   History of current pregnancy: G2P1001   Patient entered care with [facility] at 10 wks.   EDC 01/12/21 by 9+1 wk [place], due to irreg menses  Anatomy scan:  20+1 wks, complete w/ posterior placenta w/ dynamic venous lake seen near the cord insertion. Finding was benign and no further follow-up required    Antenatal testing: for IUGR started at 32 weeks   Significant prenatal events:  Patient Active Problem List   Diagnosis Date Noted  . IUGR (intrauterine growth restriction) affecting care of mother 12/24/2020  . Cannabis use without complication 12/24/2020  . Major depressive disorder, recurrent episode (HCC) 09/10/2015    Prenatal Labs: ABO, Rh: --/--/A POS (06/08 0128) Antibody: NEG (06/08 0128) Rubella: Immune (11/23 0000)  RPR: Nonreactive (11/23 0000)  HBsAg: Negative (11/23 0000)  HIV: Non-reactive (11/23 0000)  GTT: passed 1 hr GBS:   neg GC/CHL: neg/neg Genetics: low-risk, AFP normal  Tdap/influenza vaccines: tdap current, declined flu   OB History  Gravida Para Term Preterm AB Living  2 1 1  0 0 1  SAB IAB Ectopic Multiple Live Births  0 0 0 0 1    # Outcome Date GA Lbr Len/2nd Weight Sex Delivery Anes PTL Lv  2 Current           1 Term 06/13/18 [redacted]w[redacted]d 17:20 / 00:23 2570 g M Vag-Spont EPI  LIV    Medical / Surgical History: Past medical history:  Past Medical History:  Diagnosis Date  . Anxiety   . Asthma   . Bipolar disease, manic (HCC)   . Cyclical vomiting syndrome   . Depression   . Dyspnea   . Headache   . Recurrent upper respiratory infection (URI)   . Schizophrenia [facility])     Past surgical history:  Past Surgical History:  Procedure Laterality Date   . TONSILLECTOMY     Family History:  Family History  Problem Relation Age of Onset  . Hypertension Mother   . Seizures Maternal Uncle   . Breast cancer Paternal Aunt   . Kidney disease Maternal Grandmother     Social History:  reports that she quit smoking about 3 years ago. Her smoking use included cigarettes. She smoked 0.00 packs per day. She has never used smokeless tobacco. She reports previous alcohol use of about 14.0 standard drinks of alcohol per week. She reports current drug use. Drug: Marijuana.  Allergies: Patient has no known allergies.   Current Medications at time of admission:  Prior to Admission medications   Medication Sig Start Date End Date Taking? Authorizing Provider  doxylamine, Sleep, (UNISOM) 25 MG tablet Take 25 mg by mouth at bedtime as needed for sleep.   Yes [provider]  metoCLOPramide (REGLAN) 10 MG tablet Take 1 tablet (10 mg total) by mouth every 6 (six) hours. 08/10/20  Yes 08/12/20, CNM  promethazine (PHENERGAN) 25 MG tablet Take 1 tablet (25 mg total) by mouth every 6 (six) hours as needed for nausea or vomiting. 08/10/20  Yes 08/12/20, CNM  albuterol (VENTOLIN HFA) 108 (90 Base) MCG/ACT inhaler Inhale 2 puffs into the lungs every 6 (six) hours as needed  for wheezing or shortness of breath. Patient not taking: Reported on 10/15/2020 06/26/20   Hall-Potvin, [place], PA-C  famotidine (PEPCID) 20 MG tablet Take 1 tablet (20 mg total) by mouth 2 (two) times daily. Patient not taking: Reported on 09/03/2020 08/10/20   Rolm Bookbinder, CNM  ondansetron (ZOFRAN ODT) 4 MG disintegrating tablet Take 1 tablet (4 mg total) by mouth every 8 (eight) hours as needed for nausea or vomiting. 08/10/20   Rolm Bookbinder, CNM  Prenatal Vit-Fe Fumarate-FA (MULTIVITAMIN-PRENATAL) 27-0.8 MG TABS tablet Take 1 tablet by mouth daily at 12 noon. Patient not taking: Reported on 09/03/2020 05/17/20   Rolm Bookbinder, CNM  Prenatal Vit-Fe  Fumarate-FA (PRENATAL MULTIVITAMIN) TABS tablet Take 1 tablet by mouth daily at 12 noon.    [provider]  scopolamine (TRANSDERM-SCOP, 1.5 MG,) 1 MG/3DAYS Place 1 patch (1.5 mg total) onto the skin every 3 (three) days. Patient not taking: Reported on 09/03/2020 08/10/20   Rolm Bookbinder, CNM  omeprazole (PRILOSEC) 20 MG capsule Take 1 capsule (20 mg total) by mouth daily. 11/20/19 12/11/19  Darr, Gerilyn Pilgrim, PA-C    Review of Systems: Constitutional: Negative   HENT: Negative   Eyes: Negative   Respiratory: Negative   Cardiovascular: Negative   Gastrointestinal: Negative  Genitourinary: neg for bloody show, neg for LOF   Musculoskeletal: Negative   Skin: Negative   Neurological: Negative   Endo/Heme/Allergies: Negative   Psychiatric/Behavioral: Negative    Physical Exam: VS: Blood pressure 122/69, pulse 80, temperature 98 F (36.7 C), temperature source Oral, resp. rate 19, height 5' 4.5" (1.638 m), weight 62 kg, last menstrual period 04/01/2020. AAO x3, no signs of distress Cardiovascular: RRR Respiratory: Lung fields clear to ausculation GU/GI: Abdomen gravid, non-tender, non-distended, active FM, vertex, EFW 5.5# per Leopold's Extremities: no edema, negative for pain, tenderness, and cords  Cervical exam:Dilation: 2.5 Effacement (%): 70 Station: -2 Exam by:: Derinda Late, RN FHR: baseline rate 140 / variability moderate / accelerations present / absent decelerations TOCO: irreg   Prenatal Transfer Tool  Maternal Diabetes: No Genetic Screening: Normal Maternal Ultrasounds/Referrals: IUGR Fetal Ultrasounds or other Referrals:  Referred to Materal Fetal Medicine  Maternal Substance Abuse:  Yes:  Type: Marijuana Significant Maternal Medications:  None Significant Maternal Lab Results: Group B Strep negative    Assessment: 25 y.o. G2P1001 [redacted]w[redacted]d IOL for IUGR <1%   Cytotec x1 dose, will start Pitocin now FHR category 1 GBS neg Pain management plan:  epidural   Plan:  Admit to L&D Routine admission orders Epidural PRN  Dr Normand Sloop and oncoming OB team notified of admission and plan of care during sign-out  Roma Schanz MSN, CNM 12/24/2020 7:05 AM

## 2020-12-24 NOTE — Progress Notes (Signed)
Called to PACU. ABD dsging saturated. Dsg d/c'd. Steri strips removed and dermabond applied.  Honeycomb placed and pressure dsg applied.

## 2020-12-24 NOTE — Op Note (Signed)
Patient: Angela Christian. [place]  DOB: 10/01/1995 MRN: [id number]   DATE OF SURGERY: 12/24/2020.   PREOP DIAGNOSIS:  1. Umbilical cord prolapse.  2. Induction of labor for severe intrauterine growth restriction at less than 3rd percentile.  3.  [redacted] week EGA intrauterine pregnancy.     POSTOP DIAGNOSIS: Same as above.  PROCEDURE: Emergency Primary low uterine segment transverse cesarean section via Pfannenstiel incision.     SURGEONS: Dr. Lyndel Safe then Dr. Hoover Browns  ASSISTANTS: Dr.Julia Marsala, Johney Frame, CNM  ATTENDING ATTESTATION: I was present and scrubbed and performed the procedure and the assistant was required due to the complexity of anatomy.  ANESTHESIA: General ETA  COMPLICATIONS: None  FINDINGS: Viable female infant in cephalic presentation, Vertex presentation, weight 4lbs 14.3 oz, Apgar scores of 9 and 9. Normal uterus and fallopian tubes and ovaries bilaterally.    EBL:  405 cc  IV FLUID:  1300 cc LR   URINE OUTPUT: 200 cc clear urine  INDICATIONS:  25 y/o G2P1 who presented for induction of labor for severe intrauterine growth restriction.  A recent ultrasound on 12/18/20 had shown an EFW of 4lbs 12 oz (2.8%).  She received 1 dose of cytotec then pitocin and also had artificial rupture of membranes.  Fetal decelerations were subsequently noted on the monitor and RN performed an exam which revealed cord prolapse.  At that point faculty attending Dr. Alvester Morin was consulted and an emergency cesarean delivery was called, please see her notes for more details.   PROCEDURE:  I arrived to find the patient already delivered by Dr. Alvester Morin and uterus closed in one layer.  At this point I took over and did an imbricating second layer on the uterus with 0- Vicryl ensuring to stay above the bladder edge.  Irrigation was then applied and suctioned.  Excellent hemostasis was noted over the incision.  An exam revealed the above findings.  The muscles and peritoneum were then  reapproximated using 2-0 chromic suture.  Fascia was closed using 0 Vicryl in a running stitch. The subcutaneous layer was irrigated, suctioned out then closed using 1-0 plain in interrupted stitches. The skin was closed using the [place] needle. Benzocaine and steri strips were applied.  Honeycomb and pressure dressings were then applied. The patient was then awoken from anesthesia, cleaned and she was taken to the recovery room in stable condition.  Her baby was already in the nursery at that point and in stable condition.   SPECIMEN: Placenta to pathology, umbilical cord blood to lab.   DISPOSITION: TO PACU, STABLE.   Dr. Sallye Ober.   12/24/2020.

## 2020-12-25 ENCOUNTER — Encounter (HOSPITAL_COMMUNITY): Payer: Self-pay | Admitting: Obstetrics & Gynecology

## 2020-12-25 ENCOUNTER — Other Ambulatory Visit: Payer: Self-pay

## 2020-12-25 ENCOUNTER — Ambulatory Visit: Payer: Medicaid Other

## 2020-12-25 DIAGNOSIS — D509 Iron deficiency anemia, unspecified: Secondary | ICD-10-CM | POA: Diagnosis present

## 2020-12-25 LAB — CBC
HCT: 27.3 % — ABNORMAL LOW (ref 36.0–46.0)
Hemoglobin: 9.2 g/dL — ABNORMAL LOW (ref 12.0–15.0)
MCH: 33.5 pg (ref 26.0–34.0)
MCHC: 33.7 g/dL (ref 30.0–36.0)
MCV: 99.3 fL (ref 80.0–100.0)
Platelets: 198 10*3/uL (ref 150–400)
RBC: 2.75 MIL/uL — ABNORMAL LOW (ref 3.87–5.11)
RDW: 12.2 % (ref 11.5–15.5)
WBC: 17.1 10*3/uL — ABNORMAL HIGH (ref 4.0–10.5)
nRBC: 0 % (ref 0.0–0.2)

## 2020-12-25 MED ORDER — ACETAMINOPHEN 500 MG PO TABS
1000.0000 mg | ORAL_TABLET | Freq: Four times a day (QID) | ORAL | Status: DC
  Administered 2020-12-25 – 2020-12-27 (×8): 1000 mg via ORAL
  Filled 2020-12-25 (×9): qty 2

## 2020-12-25 MED ORDER — IBUPROFEN 600 MG PO TABS
600.0000 mg | ORAL_TABLET | Freq: Four times a day (QID) | ORAL | Status: DC
  Administered 2020-12-26 – 2020-12-27 (×7): 600 mg via ORAL
  Filled 2020-12-25 (×8): qty 1

## 2020-12-25 MED ORDER — HYDROMORPHONE HCL 1 MG/ML IJ SOLN
0.2500 mg | Freq: Once | INTRAMUSCULAR | Status: AC
  Administered 2020-12-25: 0.25 mg via INTRAVENOUS
  Filled 2020-12-25: qty 1

## 2020-12-25 MED ORDER — SERTRALINE HCL 50 MG PO TABS
50.0000 mg | ORAL_TABLET | Freq: Every day | ORAL | Status: DC
  Administered 2020-12-25 – 2020-12-27 (×3): 50 mg via ORAL
  Filled 2020-12-25 (×3): qty 1

## 2020-12-25 MED ORDER — ACETAMINOPHEN 500 MG PO TABS
1000.0000 mg | ORAL_TABLET | Freq: Once | ORAL | Status: AC
  Administered 2020-12-25: 1000 mg via ORAL
  Filled 2020-12-25: qty 2

## 2020-12-25 NOTE — Lactation Note (Signed)
Lactation Consultation Note Late entry for 0200 Mom very alert and eager to feed baby. Mom wanting to breast feed baby. Baby sleeping soundly. LC attempted to latch baby.  Baby has absolutely no interest in BF at this time. Baby wouldn't even open mouth. Baby sleepy. Mom has very minimal breast tissue. Mom stated she had increase in breast size that she was flat w/no breast at all. Mom stated areolas and nipples got bigger. Hand expression demonstrate w/no colostrum noted. LC instructed mom on how supplement w/bottle. Baby didn't have any interest in bottle feeding. Demonstrated pace feeding, holding baby upright. FOB took baby and FOB got baby to take some formula.  Mom shown how to use DEBP & how to disassemble, clean, & reassemble parts. Mom knows to pump q3h for 15-20 min. Mom pumped before LC left collecting a few drops of colostrum. Mom encouraged to waken baby for feeds.    LPI information sheet given and reviewed.  Discussed importance of strict I&O d/t IUGR.  LC will fax referral for [facility].  Patient Name: ALYRIA KRACK [id number] Date: 12/25/2020   Age:25 y.o.  Maternal Data    Feeding    LATCH Score                    Lactation Tools Discussed/Used    Interventions    Discharge    Consult Status      Charyl Dancer 12/25/2020, 4:31 AM

## 2020-12-25 NOTE — Progress Notes (Signed)
   12/25/20 2118  Vital Signs  BP 130/76  BP Location Left Arm  Patient Position (if appropriate) Sitting  BP Method Automatic  Pulse Rate 89  Pulse Rate Source Dinamap  Resp 16  Temp 98.1 F (36.7 C)  Temp Source Oral  Oxygen Therapy  SpO2 100 %   Patient has complaints of chest pain and pressure, pain in her shoulders, and her back. Patient stated that she feels like she needs to pass gas, but can't. RN took patients vital signs; See above.  RN notified the midwife on call Rhea Pink and she stated that she would order an EKG and further orders would be given after the EKG was performed. RN will continue to monitor patient.

## 2020-12-25 NOTE — Progress Notes (Signed)
  RN called to report pt feeling anxious and requesting IV Ativan. I assessed pt at bedside. Pt reports being dx w/ anxiety and depression "years ago". She was Rx Prozac but states she did not take it long enough to notice if it was effective. Pt reports resorting to marijuana to ease feelings of anxiety. Pt states that marijuana no longer helps w/ anxiety and is concerned for depression. Discussed use of Zoloft for management and pt agrees. Advised to begin now and continue. Will monitor mood in 2 weeks.   Rhea Pink, MSN, CNM 12/25/2020 1:31 PM

## 2020-12-25 NOTE — Op Note (Signed)
Angela Christian PROCEDURE DATE: 12/25/2020  PREOPERATIVE DIAGNOSES: Intrauterine pregnancy at [redacted]w[redacted]d weeks gestation;  Cord prolapse  POSTOPERATIVE DIAGNOSES: The same  PROCEDURE: Primary Low Transverse Cesarean Section  SURGEON:  Federico Flake   ASSISTANT:  Casper Harrison, [place] Fellow  ANESTHESIOLOGY TEAM: Anesthesiologist: Lucretia Kern, MD CRNA: Shanon Payor, CRNA; Rica Records, CRNA; Sterling, Payne M, CRNA  INDICATIONS: Angela Christian is a 25 y.o. G2P2002 at [redacted]w[redacted]d who was having IOL for IUGR, I was involved emergently due to Cord Prolapse with her primary OB off site at the time. Angela Christian was under the care of the CNM at the time.  I introduced myself to the patient and my role in the obstetrical team including that I was not with her primary practice but with the [facility] staff that are in the hospital 24-7 and assist our colleagues in emergent situations.  See my time stamped note regarding consent for procedure on 12/25/20.  The risks of cesarean section were discussed with the patient including but were not limited to: bleeding which may require transfusion or reoperation; infection which may require antibiotics; injury to bowel, bladder, ureters or other surrounding organs; injury to the fetus; need for additional procedures including hysterectomy in the event of a life-threatening hemorrhage; placental abnormalities wth subsequent pregnancies, incisional problems, thromboembolic phenomenon and other postoperative/anesthesia complications.   The patient concurred with the proposed plan, giving informed written consent for the procedure.    FINDINGS:  Viable female infant in cephalic presentation.  Apgars APGAR (1 MIN): 9   APGAR (5 MINS): 9    Nuchal cord x1 present at delivery.  SGA evident but infant was vigorous and NICu agreed with delayed cord clamping which was performed. Infant received 1 minute of delayed cord clamping. Clear amniotic fluid.  Intact placenta,  three vessel cord.  Normal uterus, fallopian tubes and ovaries bilaterally.  ANESTHESIA: General anesthesia SPECIMENS: Placenta sent to pathology COMPLICATIONS: None immediate  PROCEDURE IN DETAIL:  The patient preoperatively/intraoperatively received intravenous antibiotics and had sequential compression devices applied to her lower extremities.  In the operating room the patient recieved general anesthesia and was found to be adequate. She was then placed in a dorsal supine position with a leftward tilt, and prepped and draped in a sterile manner.  A foley catheter was placed into her bladder and attached to constant gravity.    After an adequate timeout was performed, a Pfannenstiel skin incision was made with scalpel and carried through to the underlying layer of fascia. The fascia was opened bluntly as was peritoneum.  Bladder blade was placed.  Attention was turned to the lower uterine segment where a low transverse hysterotomy was made with a scalpel and extended bilaterally bluntly.  Uterine entry was at the level of the infants shoulder/nape of neck. Infant was successfuly elevated out of the pelvis and delivery through a loose nuchal. The infant was successfully delivered in the typical fashion the cord was clamped and cut after one minute, and the infant was handed over to the awaiting neonatology team. Uterine massage was then administered, and the placenta delivered intact with a three-vessel cord. The uterus was then cleared of clots and debris.  The hysterotomy was closed with 0 Vicryl in a running locked fashion. It was noticed that the hysterotomy was close to the bladder and urine remained clear during my time repairing the hysterotomy.  At this point Dr. Sallye Ober was scrubbed and in the operating room. I provided warm hand  off of the case to her and her assistant the CNM for her practice. I noted to Dr. Sallye Ober the proximity of the bladder and a small left lateral extension of the  hysterotomy.  She assumed care and the remainder of the operative procedure will be her dictation/note.   Federico Flake, MD Center for [facility]

## 2020-12-25 NOTE — Lactation Note (Signed)
This note was copied from a baby's chart. Lactation Consultation Note  Patient Name: Angela Christian Date: 12/25/2020 Reason for consult: Follow-up assessment;Early term 37-38.6wks;Infant < 6lbs Age:27 hours  P2 mother whose infant is now 24 hours old.  This is an ETI at 37+0 weeks weighing < 5 lbs.  Mother is breast feeding and supplementing with Similac Neosure 22 calorie formula.  Baby was swaddled and asleep when I arrived.  He has not latched to the breast at all yet.  Previous LC noted difficulty with sucking.  He fed 5 mls at 0800.  Reviewed progress to date and asked permission to return at 1100 to assess and further educate family regarding newborn and feeding.  Mother appreciative.  At that time I will assess baby's sucking/feeding ability and put in an SLP consult if needed.  RN updated.   Maternal Data Has patient been taught Hand Expression?: Yes  Feeding Mother's Current Feeding Choice: Breast Milk and Formula Nipple Type: Extra Slow Flow  LATCH Score                    Lactation Tools Discussed/Used    Interventions    Discharge    Consult Status Consult Status: Follow-up Date: 12/25/20 Follow-up type: In-patient    [name] [name] 12/25/2020, 10:24 AM

## 2020-12-25 NOTE — Progress Notes (Addendum)
Subjective: POD# 1 Information for the patient's newborn:  Reniyah, Gootee [[id number]]  female   Baby's Name Nazir Circumcision request in-patient circ  Reports feeling sore and tired, reviewed pain medication schedule Feeding: breast and bottle Reports tolerating PO and denies N/V, foley removed, ambulating and urinating w/o difficulty  Pain controlled with  PO meds and IV Toradol Denies HA/SOB/dizziness  Flatus not passing Vaginal bleeding is normal, no clots     Objective:  VS:  Vitals:   12/24/20 2030 12/25/20 0030 12/25/20 0430 12/25/20 0735  BP: (!) 129/58 139/70  128/75  Pulse: (!) 57 62  70  Resp: 17 18 18 20   Temp: 97.9 F (36.6 C) 98 F (36.7 C) 98 F (36.7 C) 98 F (36.7 C)  TempSrc: Oral Oral Oral Oral  SpO2: 99% 100% 100% 100%  Weight:      Height:        Intake/Output Summary (Last 24 hours) at 12/25/2020 1109 Last data filed at 12/25/2020 02/24/2021 Gross per 24 hour  Intake 1300 ml  Output 2007 ml  Net -707 ml     Recent Labs    12/24/20 0128 12/25/20 0535  WBC 8.5 17.1*  HGB 10.4* 9.2*  HCT 31.0* 27.3*  PLT 227 198    Blood type: --/--/A POS (06/08 0128) Rubella: Immune (11/23 0000)    Physical Exam:  General: alert, cooperative, and mild distress CV: Regular rate and rhythm or without murmur or extra heart sounds Resp: clear Abdomen: soft, non-tender, hypoactive BS x4 Incision: clean, dry, intact, and pressure dressing to remain in place for 48 hrs and be removed 12/26/20 Perineum:  Uterine Fundus: firm, below umbilicus, nontender Lochia:  appropriate Ext:  no edema, negative for pain, tenderness, or cords.    Assessment/Plan: 25 y.o.   POD# 1. 30  Primary C/S for cord prolapse                 Active Problems:   IUGR (intrauterine growth restriction) affecting care of mother   Cannabis use without complication   Postpartum care following cesarean delivery   IDA (iron deficiency anemia)   Routine post-op PP care   Asymptomatic  anemia        Advance diet as tolerated Advised warm fluids and ambulation to improve GI motility Breastfeeding support Anticipate D/C by POD#3  [id number], MSN, CNM 12/25/2020, 11:09 AM

## 2020-12-25 NOTE — Clinical Social Work Maternal (Signed)
CLINICAL SOCIAL WORK MATERNAL/CHILD NOTE  Patient Details  Name: Angela Christian MRN: [id number] Date of Birth: [birth year]  Date:  12/25/2020  Clinical Social Worker Initiating Note:  Kathrin Greathouse, [place] Date/Time: Initiated:  12/25/20/1027     Child's Name:  Angela Christian   Biological Parents:  Mother, Father   Need for Interpreter:  None   Reason for Referral:  Behavioral Health Concerns, Current Substance Use/Substance Use During Pregnancy     Address:  [place] [address]    Phone number:  [phone] (home)     Additional phone number:   Household Members/Support Persons (HM/SP):   Household Member/Support Person 1, Household Member/Support Person 2   HM/SP Name Relationship DOB or Age  HM/SP -1 Hyman Bower FOB/Significant Other 10-07-1993  HM/SP -2 Tera Mater 06-13-2018  HM/SP -3        HM/SP -4        HM/SP -5        HM/SP -6        HM/SP -7        HM/SP -8          Natural Supports (not living in the home):  Immediate Family   Professional Supports:     Employment: Unemployed   Type of Work:     Education:  Programmer, systems   Homebound arranged:    Museum/gallery curator Resources:  Medicaid   Other Resources:  Physicist, medical  , [place] Considerations Which May Impact Care:    Strengths:  Ability to meet basic needs  , Home prepared for child  , Pediatrician chosen   Psychotropic Medications:         Pediatrician:    [place]  Pediatrician List:   [place]  [place]      Pediatrician Fax Number:    Risk Factors/Current Problems:  Substance Use  , Mental Health Concerns     Cognitive State:  Able to Concentrate  , Alert  , Insightful  , Linear Thinking     Mood/Affect:  Bright  , Anxious  , Comfortable     CSW Assessment:  CSW received consult for hx of Anxiety,  Schizophrenia, Bipolar and Depression.  CSW met with MOB to offer support and complete assessment.    CSW met with MOB at bedside. CSW observed MOB sitting up in bed, FOB and MOB mother present at bedside. CSW congratulated MOB and FOB. CSW introduced role and offered MOB privacy/HIPPA. MOB asked her mother to step out and preferred that FOB stay. MOB presented calm and receptive to [place] visit. CSW confirmed MOB demographic information was correct and inquired about members of her household. MOB reports she lives with her son Angela Christian (06-13-2018) and FOB Hyman Bower (10-07-1993). CSW inquired how MOB has felt since giving birth. MOB disclosed that she is still trying to process the birthing experience because she had an emergency C-section. MOB reports she is in pain and has not been able to hold the baby for a short period of time. She reports the infant is having trouble latching and she cannot get him to eat. MOB reports FOB and her mother have been able to bottle feed the infant.  MOB tearfully express she is lost for words. CSW provided active listening and emotional support. MOB requested medication for  her anxiety symptoms. MOB nurse came in during the assessment to provide care. MOB notified nursing staff during this time.   CSW inquired about MOB history of anxiety, bipolar, Schizophrenia and depression. MOB acknowledges that she has a history of anxiety, bipolar, schizoaffective disorder and depression. MOB reports she has been doing well but does have moments of depression and anxiety. MOB reports she was going to [place] some years ago however she has not been recently. MOB reports she was prescribed medication for her mental health but did not take them consistently and has not needed it. MOB reports she is still learning how to cope instead of shutting everyone out. CSW inquired if MOB experienced post-partum with her last child. MOB explain she had postpartum with the last birth. MOB  reports her symptoms started when she left the hospital. She was anxious about caring for the baby and did not want to be alone. MOB reports she stayed with her mother for a few weeks before returning to her own home. MOB reports this time feels different because she has support from FOB and her mother. She reports last time she was caring for infant mostly alone. CSW inquired about MOB supports. MOB acknowledge her FOB, mother and stepdad as supports. CSW provided education regarding the baby blues period vs. perinatal mood disorders, discussed treatment and gave resources for mental health follow up if concerns arise. CSW encouraged MOB to reached out to OBGYN if she continues to have anxiety after discharge. MOB agreeable to reach out.  CSW recommended MOB complete a self-evaluation during the postpartum time period using the New Mom Checklist from Postpartum Progress and encouraged MOB to contact a medical professional if symptoms are noted. MOB attentive and receptive to the information provided by CSW. CSW assessed MOB for safety. MOB denies thoughts of harm to self and others.   CSW inquired if MOB used substances during her pregnancy. MOB reports she used THC during her pregnancy. MOB reports she used throughout the pregnancy and the last time she used was earlier this week. MOB reports THC helped with her nausea and anxiety symptoms. CSW informed MOB about the hospital drug screen policy. CSW will follow UDS, CDS and notified CPS, if warranted. MOB reports understanding and had no question about the policy.  CSW inquired about CPS history. MOB reports CPS involvement in 2019 for THC use. MOB reports the case has been closed. MOB denies using other substances.   CSW provided review of Sudden Infant Death Syndrome (SIDS) precautions and informed MOB no co-sleeping with the infant. MOB report the infant will sleep in a pack in play. MOB reports she has all essential items for the infant including a car  seat. CSW inquired about WIC/FS. MOB reports she received WIC/FS and plans to call for the infant.  MOB has chosen [facility]. CSW inquired if MOB has transportation to the appointment. MOB reports she has transportation to the appointment however she will need transportation to future appointments. MOB complete a [place] rider waiver form. CSW assessed MOB for additional needs. MOB reports no further need.   -Infant's UDS positive for THC. CSW will make a report to [place].  CSW identifies no further need for intervention and no barriers to discharge at this time.      CSW Plan/Description:  CSW Will Continue to Monitor Umbilical Cord Tissue Drug Screen Results and Make Report if Warranted, Sudden Infant Death Syndrome (  SIDS) Education, Other Information/Referral to [place], Child Protective Service Report  , Perinatal Mood and Anxiety Disorder (PMADs) Education, No Further Intervention Required/No Barriers to Discharge    Lia Hopping, LCSW 12/25/2020, 11:39AM

## 2020-12-25 NOTE — Lactation Note (Signed)
This note was copied from a baby's chart. Lactation Consultation Note  Patient Name: Angela Christian [id number] Date: 12/25/2020 Reason for consult: Follow-up assessment;Early term 37-38.6wks;1st time breastfeeding Age:27 hours   P2 mother whose infant is now 73 hours old.  This is an ETI at 37+0 weeks weighing < 5 lbs.  Mother provided colostrum only in the hospital with her first child (now 34 years old).  Mother's feeding preference is breast/formula.  Returned for a latch assist as discussed earlier this morning.  Taught hand expression.  Mother able to express one drop of colostrum.  Assisted to latch in the cross cradle hold.  Baby latched but showed no interest in initiating a suck.  Gentle stimulation and breast compressions demonstrated with no success with sucking.    Discussed paced bottle feeding and demonstrated for parents.  Initially, baby very uninterested in sucking.  Performed suck training with an improved grasp and intermittent rhythmic movements upon my gloved finger. Baby has a tight suck.  Showed parents how to flange upper lip and obtain and maintain a good suck using the purple extra slow flow nipple.  Baby calm and relaxed during the feeding.  Burped frequently.  Observed him feeding 15 mls in 19 minutes with constant cheek/jaw support.  He made good progress in this one session, however, asked RN to place a SLP consult to assess.  Reviewed LPTI policy guidelines in detail.  Parents verbalized understanding.  Observed mother pumping for 15 minutes and colostrum drops obtained in the flange.  Finger fed additional drops to baby.  Taught father how to clean pump parts.  Father very supportive and assists with care.  Encouraged mother to call as needed for latch assistance.  RN updated.   Maternal Data Has patient been taught Hand Expression?: Yes Does the patient have breastfeeding experience prior to this delivery?: No (Fed colostrum only to her 27 year old in the  hospital)  Feeding Mother's Current Feeding Choice: Breast Milk and Formula Nipple Type: Extra Slow Flow  LATCH Score Latch: Too sleepy or reluctant, no latch achieved, no sucking elicited.  Audible Swallowing: None  Type of Nipple: Everted at rest and after stimulation  Comfort (Breast/Nipple): Soft / non-tender  Hold (Positioning): Assistance needed to correctly position infant at breast and maintain latch.  LATCH Score: 5   Lactation Tools Discussed/Used    Interventions Interventions: Breast feeding basics reviewed;Assisted with latch;Skin to skin;Breast massage;Hand express;Breast compression;Adjust position;DEBP;Hand pump;Expressed milk;Position options;Support pillows;Education  Discharge Pump: DEBP;Manual  Consult Status Consult Status: Follow-up Date: 12/26/20 Follow-up type: In-patient    [place] Greb R Sharlot Sturkey 12/25/2020, 11:53 AM

## 2020-12-26 DIAGNOSIS — Z8659 Personal history of other mental and behavioral disorders: Secondary | ICD-10-CM

## 2020-12-26 DIAGNOSIS — D62 Acute posthemorrhagic anemia: Secondary | ICD-10-CM | POA: Diagnosis not present

## 2020-12-26 DIAGNOSIS — F259 Schizoaffective disorder, unspecified: Secondary | ICD-10-CM | POA: Diagnosis present

## 2020-12-26 DIAGNOSIS — Z3490 Encounter for supervision of normal pregnancy, unspecified, unspecified trimester: Secondary | ICD-10-CM | POA: Diagnosis present

## 2020-12-26 LAB — SURGICAL PATHOLOGY

## 2020-12-26 MED ORDER — ARIPIPRAZOLE 5 MG PO TABS
5.0000 mg | ORAL_TABLET | Freq: Every day | ORAL | Status: DC
  Administered 2020-12-26: 5 mg via ORAL
  Filled 2020-12-26 (×2): qty 1

## 2020-12-26 MED ORDER — POLYSACCHARIDE IRON COMPLEX 150 MG PO CAPS
150.0000 mg | ORAL_CAPSULE | Freq: Every day | ORAL | Status: DC
  Administered 2020-12-26 – 2020-12-27 (×2): 150 mg via ORAL
  Filled 2020-12-26 (×2): qty 1

## 2020-12-26 MED ORDER — MAGNESIUM OXIDE -MG SUPPLEMENT 400 (240 MG) MG PO TABS
400.0000 mg | ORAL_TABLET | Freq: Every day | ORAL | Status: DC
  Administered 2020-12-26 – 2020-12-27 (×2): 400 mg via ORAL
  Filled 2020-12-26 (×2): qty 1

## 2020-12-26 MED ORDER — HYDROXYZINE HCL 25 MG PO TABS
50.0000 mg | ORAL_TABLET | Freq: Three times a day (TID) | ORAL | Status: DC | PRN
  Administered 2020-12-26 – 2020-12-27 (×4): 50 mg via ORAL
  Filled 2020-12-26 (×4): qty 2

## 2020-12-26 NOTE — Progress Notes (Addendum)
Angela Christian [id number] Postpartum Postoperative Day # 2  Angela Christian, [id number], [redacted]w[redacted]d, S/P Primary LT Cesarean Section due to STAT for prolapsed cord. Pt was admitted on 6/8 for IOL for IUGR <1%, progressed with Cytotec, and pitocin then AROM with prolapsed cord, STAT PCS called and performed by Dr Alvester Morin with general anesthesia, QBL was , with hgb drop of 10.4-9.2, asymptomatic. PP pt having pain control issues, pain management plan reviewed with RN, suspect pain from intra-gas, if no improvement by this after will add gabapentin to regimen. Pt has h/ o cannabis induced hyper-emesis, stable now, no vomiting, added atarax for anxiety -nausea to prevent. H/O MDD, Anxiety, Schizoeffective, Bipolar started on zoloft 6/9 50mg  daily, tolerates well, denies SI/HI. +Cannabis use this pregnancy, SW created CPS case.   Patient Active Problem List   Diagnosis Date Noted   Schizoaffective disorder (HCC) 12/26/2020   Cesarean delivery, delivered, current hospitalization 12/26/2020   Encounter for induction of labor 12/26/2020   H/O anxiety disorder 12/26/2020   Acute blood loss anemia 12/26/2020   IDA (iron deficiency anemia) 12/25/2020   IUGR (intrauterine growth restriction) affecting care of mother 12/24/2020   Cannabis use without complication 12/24/2020   Postpartum care following cesarean delivery 12/24/2020   Major depressive disorder, recurrent episode (HCC) 09/10/2015     Active Ambulatory Problems    Diagnosis Date Noted   Major depressive disorder, recurrent episode (HCC) 09/10/2015   Resolved Ambulatory Problems    Diagnosis Date Noted   Nausea and vomiting 01/13/2013   Protein-calorie malnutrition, severe (HCC) 01/14/2013   UTI (lower urinary tract infection) 02/10/2015   Cyclical vomiting syndrome 02/10/2015   Abdominal pain 02/10/2015   Hypokalemia 02/10/2015   Malnutrition of moderate degree (HCC) 02/10/2015   Secondary amenorrhea 02/27/2015   MDD (major depressive  disorder) 09/10/2015   Polysubstance abuse (HCC) 09/10/2015   Unspecified mood (affective) disorder (HCC) 09/10/2015   Cannabis abuse, continuous 04/02/2018   Gastroesophageal reflux disease 04/02/2018   [redacted] weeks gestation of pregnancy 04/02/2018   Vomiting pregnancy 04/02/2018   Indication for care in labor and delivery, antepartum 06/13/2018   Normal postpartum course 06/15/2018   Positive pregnancy test 05/09/2020   Past Medical History:  Diagnosis Date   Anxiety    Asthma    Bipolar disease, manic (HCC)    Depression    Dyspnea    Headache    Recurrent upper respiratory infection (URI)    Schizophrenia (HCC)      Subjective: Patient up ad lib, but not moving much due to , suspect gas pain, pt encouraged to ambulate, denies syncope or dizziness. Reports consuming regular diet without issues and denies N/V. Patient reports 1 bowel movement + passing flatus.  Denies issues with urination and reports bleeding is "lighter."  Patient is breastfeeding and reports going well.  Desires mini pill for postpartum contraception.  Pain is not being appropriately managed, pain meds not being give, conflict of regimen with night RN reported. New pain management made today with RN with use of po IBU, tylenol , atarax, and oxy IR to be given now then every 6 hours, pt to ambulate, and use abdominal binder, if no improvement will add gabapentin. Mood stable, denies SI/HI, denies s/sx from starting zoloft. Pt endorses had some mild nausea yesterday but denies vomiting. Discussed other mental health dx such as schizoeffective disorder, MDD, anxiety and bipolar, discussed risk of manic episode with only SSRI, discussed R/B/A of adding antipsychotic, presented with Lamictal or  Abilify, pt endorses desire to bottle feed, and start Abilify. Discussed breast versus bottle feeding with s/sx and side effects for newborn, since newborn is small and risk is greater then benefits right now, pt endorses desire to bottle  feed and work on her mental health. Pt denies currently auditory hallucination and admitted to having gentle auditory hallucination in the past ('like someone is saying something or calling my name, but no one is"), also admitted to paranoia and believes people are talking about her, hypersexuality, manic episode with getting in trouble with money, and little sleep. Pt desires help, and has never taken medication in past, but admits to having super high anxiety and the desire to change.    Objective: Patient Vitals for the past 24 hrs:  BP Temp Temp src Pulse Resp SpO2  12/26/20 0621 128/72 98.3 F (36.8 C) Oral 69 -- 100 %  12/26/20 0215 120/70 98.1 F (36.7 C) Oral (!) 59 14 99 %  12/25/20 2303 133/76 98.1 F (36.7 C) Oral 73 16 --  12/25/20 2118 130/76 98.1 F (36.7 C) Oral 89 16 100 %  12/25/20 1849 134/87 -- -- 75 18 100 %  12/25/20 1404 127/67 98.2 F (36.8 C) Oral 71 19 100 %     Physical Exam:  General: alert, cooperative, appears stated age, and no distress Mood/Affect: flat Lungs: clear to auscultation, no wheezes, rales or rhonchi, symmetric air entry.  Heart: normal rate, regular rhythm, normal S1, S2, no murmurs, rubs, clicks or gallops. Breast: breasts appear normal, no suspicious masses, no skin or nipple changes or axillary nodes. Abdomen:  + bowel sounds, soft, non-tender Incision: healing well, no significant drainage, no dehiscence, Honeycomb dressing  Uterine Fundus: firm, involution -1 Lochia: appropriate Skin: Warm, Dry. DVT Evaluation: No evidence of DVT seen on physical exam. Negative Homan's sign. No cords or calf tenderness. No significant calf/ankle edema.  Labs: Recent Labs    12/24/20 0128 12/25/20 0535  HGB 10.4* 9.2*  HCT 31.0* 27.3*  WBC 8.5 17.1*    CBG (last 3)  No results for input(s): GLUCAP in the last 72 hours.   I/O: I/O last 3 completed shifts: In: -  Out: 1600 [Urine:1600]   Assessment Postpartum Postoperative Day # 2   Angela Christian, [id number], [redacted]w[redacted]d, S/P Primary LT Cesarean Section due to STAT for prolapsed cord. Pt was admitted on 6/8 for IOL for IUGR <1%, progressed with Cytotec, and pitocin then AROM with prolapsed cord, STAT PCS called and performed by Dr Alvester Morin with general anesthesia, QBL was , with hgb drop of 10.4-9.2, asymptomatic. PP pt having pain control issues, pain management plan reviewed with RN, suspect pain from intra-gas, if no improvement by this after will add gabapentin to regimen. Pt has h/ o cannabis induced hyper-emesis, stable now, no vomiting, added atarax for anxiety -nausea to prevent. H/O MDD, Anxiety, Schizoeffective, Bipolar started on zoloft 6/9 50mg  daily, tolerates well, adding mood stabilizer to reduce risk of manic episode today denies SI/HI. +Cannabis use this pregnancy, SW created CPS case.   Pt stable. -1 Involution. Bottle Feeding. Hemodynamically Stable.  Plan: Continue other mgmt as ordered Anemia: Start O Iron daily with magnesium to counteract constipation.  MDD/Anxiety/Schizoaffective/Bipolar: Bottle feeding only now, continue zoloft 50mg  daily, start Abilify 5mg  tonight, increase to 10 in one week as tolerated, if mood stable may stay at 5mg , report SI/HI, report EPS, TD or side effects, instructed to take benadryl if EPS or TD s/sx presents.  Pain management:  Tylenol, motrin, oxy Q6H together, continue abdominal binder and movement, if no improvement, will add gabapentin.  Nausea: Adding aratax to medication regimen, to decrease nausea, anxiety.  VTE Prophylactics: SCD, ambulated as tolerates.  Pain control: Motrin/Tylenol/Narcotics PRN Education given regarding options for contraception, including barrier methods, injectable contraception, IUD placement, oral contraceptives.  Plan for discharge tomorrow, Social Work consult, and Circumcision prior to discharge  Dr. Su Hilt updated on patient status and agrees with POC.   [facility] NP-C, CNM 12/26/2020,  1:35 PM

## 2020-12-26 NOTE — Lactation Note (Signed)
This note was copied from a baby's chart. Lactation Consultation Note  Patient Name: Angela Christian [id number] Date: 12/26/2020   Age:27 hours  LC checked with RN, Adrienne Mocha, Mom bottle feed formula only since starting a new medication.   Maternal Data    Feeding Nipple Type: Dr. Lorne Skeens  [facility] [place] Score                    Lactation Tools Discussed/Used    Interventions    Discharge    Consult Status      Angela Barreras  Christian 12/26/2020, 3:52 PM

## 2020-12-26 NOTE — Social Work (Signed)
CSW escorted CPS social worker Kiara Ritter to complete an assessment. No barriers to discharge at this time.   Capucine Tryon, MSW, LCSW [facility]  Clinical Social Worker  [phone] 12/26/2020  4:30 PM  

## 2020-12-26 NOTE — Progress Notes (Signed)
RN rounded on patient during the 0600 hour and patient asked for the "pill form of Dilaudid." Patient had expressed to RN that the Dilaudid given to her by dayshift RN did not work. RN had scheduled Tylenol and Ibuprofen to give patient, but patient rated pain sites (Neck, back, chest, and abdomen) 7 and RN offered for patient to receive Oxy and she stated she wanted the Oxycodone instead. Patient was also given a gas pill because she stated that she has not been passing gas since going to the bathroom around 10pm. The midwife on call was informed during the night and an EKG was ordered due to patients chest pain, but per chart has not been read yet. RN informed dayshift RN of patients complaints and medications given.

## 2020-12-27 MED ORDER — ACETAMINOPHEN 500 MG PO TABS: 1000.0000 mg | ORAL_TABLET | Freq: Four times a day (QID) | ORAL | 0 refills | Status: DC

## 2020-12-27 MED ORDER — ARIPIPRAZOLE 5 MG PO TABS: 5.0000 mg | ORAL_TABLET | Freq: Every day | ORAL | 3 refills | Status: DC

## 2020-12-27 MED ORDER — OXYCODONE HCL 5 MG PO TABS: 5.0000 mg | ORAL_TABLET | Freq: Four times a day (QID) | ORAL | 0 refills | Status: AC | PRN

## 2020-12-27 MED ORDER — SERTRALINE HCL 50 MG PO TABS: 50.0000 mg | ORAL_TABLET | Freq: Every day | ORAL | 3 refills | Status: DC

## 2020-12-27 MED ORDER — IBUPROFEN 600 MG PO TABS: 600.0000 mg | ORAL_TABLET | Freq: Four times a day (QID) | ORAL | 0 refills | Status: DC

## 2020-12-27 NOTE — Discharge Summary (Signed)
Postpartum Discharge Summary  Date of Service updated 12/27/20    Patient Name: Angela Christian DOB: Jan 28, 1996 MRN: [id number]  Date of admission: 12/24/2020 Delivery date:12/24/2020  Delivering provider: Caren Macadam  Date of discharge: 12/27/2020  Admitting diagnosis: IUGR (intrauterine growth restriction) affecting care of mother [O36.5990] Intrauterine pregnancy: [redacted]w[redacted]d     Secondary diagnosis:  Active Problems:   IUGR (intrauterine growth restriction) affecting care of mother   Cannabis use without complication   Postpartum care following cesarean delivery   IDA (iron deficiency anemia)   Schizoaffective disorder ([place])   Cesarean delivery, delivered, current hospitalization   Encounter for induction of labor   H/O anxiety disorder   Acute blood loss anemia  Additional problems: none    Discharge diagnosis: Term Pregnancy Delivered                                              Post partum procedures: none Augmentation: AROM, Pitocin, and Cytotec Complications: Cord prolapse  Hospital course: Induction of Labor With Cesarean Section   25 y.o. yo G2P2002 at [redacted]w[redacted]d was admitted to the hospital 12/24/2020 for induction of labor for intrauterine growth restriction less than the 1%ile. Recommendation for delivery at 37 weeks 0 days by Maternal Fetal Medicine. The patient went for cesarean section due to cord prolapse. Delivery details are as follows: Membrane Rupture Time/Date: 3:00 PM ,12/24/2020   Delivery Method:C-Section, Low Transverse  Details of operation can be found in separate operative Note.  Patient had an uncomplicated postpartum course. She is ambulating, tolerating a regular diet, passing flatus, and urinating well.  Patient is discharged home in stable condition on 12/27/20.      Newborn Data: Birth date:12/24/2020  Birth time:4:23 PM  Gender:Female  Living status:Living  Apgars:9 ,9  Weight:2220 g                                Magnesium Sulfate received:  No BMZ received: No Rhophylac:N/A MMR:N/A T-DaP:Given prenatally Flu: No Transfusion:No  Physical exam  Vitals:   12/26/20 1406 12/26/20 2134 12/27/20 0557 12/27/20 1215  BP: 119/84 (!) 113/53 105/64 115/68  Pulse: 65 73 (!) 55 80  Resp: $Remo'16 18 18 19  'oTRfb$ Temp: 98.2 F (36.8 C) 98.3 F (36.8 C) 98.1 F (36.7 C) 98.5 F (36.9 C)  TempSrc: Oral Oral Oral   SpO2: 100% 100%  100%  Weight:      Height:       General: alert, cooperative, and no distress Lochia: appropriate Uterine Fundus: firm Incision: Healing well with no significant drainage, Dressing is clean, dry, and intact DVT Evaluation: No evidence of DVT seen on physical exam. No cords or calf tenderness. No significant calf/ankle edema. Labs: Lab Results  Component Value Date   WBC 17.1 (H) 12/25/2020   HGB 9.2 (L) 12/25/2020   HCT 27.3 (L) 12/25/2020   MCV 99.3 12/25/2020   PLT 198 12/25/2020   CMP Latest Ref Rng & Units 12/08/2020  Glucose 70 - 99 mg/dL 76  BUN 6 - 20 mg/dL 8  Creatinine 0.44 - 1.00 mg/dL 0.71  Sodium 135 - 145 mmol/L 137  Potassium 3.5 - 5.1 mmol/L 3.5  Chloride 98 - 111 mmol/L 107  CO2 22 - 32 mmol/L 23  Calcium 8.9 - 10.3 mg/dL  9.1  Total Protein 6.5 - 8.1 g/dL 6.6  Total Bilirubin 0.3 - 1.2 mg/dL 0.6  Alkaline Phos 38 - 126 U/L 154(H)  AST 15 - 41 U/L 24  ALT 0 - 44 U/L 19   Edinburgh Score: Edinburgh Postnatal Depression Scale Screening Tool 12/26/2020  I have been able to laugh and see the funny side of things. (No Data)  I have looked forward with enjoyment to things. -  I have blamed myself unnecessarily when things went wrong. -  I have been anxious or worried for no good reason. -  I have felt scared or panicky for no good reason. -  Things have been getting on top of me. -  I have been so unhappy that I have had difficulty sleeping. -  I have felt sad or miserable. -  I have been so unhappy that I have been crying. -  The thought of harming myself has occurred to me. Flavia Shipper Postnatal Depression Scale Total -      After visit meds:  Allergies as of 12/27/2020   No Known Allergies      Medication List     STOP taking these medications    doxylamine (Sleep) 25 MG tablet Commonly known as: UNISOM   famotidine 20 MG tablet Commonly known as: PEPCID   metoCLOPramide 10 MG tablet Commonly known as: REGLAN   multivitamin-prenatal 27-0.8 MG Tabs tablet   ondansetron 4 MG disintegrating tablet Commonly known as: Zofran ODT   promethazine 25 MG tablet Commonly known as: PHENERGAN   scopolamine 1 MG/3DAYS Commonly known as: Transderm-Scop (1.5 MG)       TAKE these medications    acetaminophen 500 MG tablet Commonly known as: TYLENOL Take 2 tablets (1,000 mg total) by mouth every 6 (six) hours.   albuterol 108 (90 Base) MCG/ACT inhaler Commonly known as: VENTOLIN HFA Inhale 2 puffs into the lungs every 6 (six) hours as needed for wheezing or shortness of breath.   ARIPiprazole 5 MG tablet Commonly known as: ABILIFY Take 1 tablet (5 mg total) by mouth at bedtime.   ibuprofen 600 MG tablet Commonly known as: ADVIL Take 1 tablet (600 mg total) by mouth every 6 (six) hours.   oxyCODONE 5 MG immediate release tablet Commonly known as: Oxy IR/ROXICODONE Take 1 tablet (5 mg total) by mouth every 6 (six) hours as needed for up to 5 days for moderate pain.   prenatal multivitamin Tabs tablet Take 1 tablet by mouth daily at 12 noon.   sertraline 50 MG tablet Commonly known as: ZOLOFT Take 1 tablet (50 mg total) by mouth daily. Start taking on: December 28, 2020         Discharge home in stable condition Infant Feeding: Bottle, formula Infant Disposition:home with mother Discharge instruction: per After Visit Summary and Postpartum booklet. Activity: Advance as tolerated. Pelvic rest for 6 weeks.  Diet: routine diet Anticipated Birth Control: Unsure and options reviewed and pt remains unsure Postpartum Appointment:6  weeks Additional Postpartum F/U: Postpartum Depression checkup 2 weeks Future Appointments:No future appointments. Follow up Visit:  Follow-up Information     Waymon Amato, MD. Schedule an appointment as soon as possible for a visit.   Specialty: Obstetrics and Gynecology Why: Make an appointment with [facility] for 2 weeks to evaluate how well your new medications are working and then return in 6 weeks for your regular postpartum appointment. Contact information: [address] [place] [address] [phone]  12/27/2020 Arrie Eastern, CNM

## 2021-02-13 ENCOUNTER — Ambulatory Visit: Payer: Medicaid Other | Admitting: Family Medicine

## 2021-05-23 ENCOUNTER — Encounter (HOSPITAL_COMMUNITY): Payer: Self-pay | Admitting: Emergency Medicine

## 2021-05-23 ENCOUNTER — Emergency Department (HOSPITAL_COMMUNITY)
Admission: EM | Admit: 2021-05-23 | Discharge: 2021-05-24 | Disposition: A | Discharge status: Home / Self Care | Payer: Medicaid Other | Attending: Emergency Medicine | Admitting: Emergency Medicine

## 2021-05-23 ENCOUNTER — Other Ambulatory Visit: Payer: Self-pay

## 2021-05-23 DIAGNOSIS — F1290 Cannabis use, unspecified, uncomplicated: Secondary | ICD-10-CM

## 2021-05-23 DIAGNOSIS — N939 Abnormal uterine and vaginal bleeding, unspecified: Secondary | ICD-10-CM | POA: Diagnosis not present

## 2021-05-23 DIAGNOSIS — J45909 Unspecified asthma, uncomplicated: Secondary | ICD-10-CM | POA: Diagnosis not present

## 2021-05-23 DIAGNOSIS — F12188 Cannabis abuse with other cannabis-induced disorder: Secondary | ICD-10-CM | POA: Diagnosis not present

## 2021-05-23 DIAGNOSIS — R197 Diarrhea, unspecified: Secondary | ICD-10-CM | POA: Insufficient documentation

## 2021-05-23 DIAGNOSIS — Z20822 Contact with and (suspected) exposure to covid-19: Secondary | ICD-10-CM | POA: Insufficient documentation

## 2021-05-23 DIAGNOSIS — R5383 Other fatigue: Secondary | ICD-10-CM | POA: Insufficient documentation

## 2021-05-23 DIAGNOSIS — Z79899 Other long term (current) drug therapy: Secondary | ICD-10-CM | POA: Diagnosis not present

## 2021-05-23 DIAGNOSIS — Z87891 Personal history of nicotine dependence: Secondary | ICD-10-CM | POA: Diagnosis not present

## 2021-05-23 DIAGNOSIS — R1084 Generalized abdominal pain: Secondary | ICD-10-CM | POA: Diagnosis not present

## 2021-05-23 DIAGNOSIS — R112 Nausea with vomiting, unspecified: Secondary | ICD-10-CM

## 2021-05-23 LAB — CBC
HCT: 52.7 % — ABNORMAL HIGH (ref 36.0–46.0)
Hemoglobin: 17.5 g/dL — ABNORMAL HIGH (ref 12.0–15.0)
MCH: 31.5 pg (ref 26.0–34.0)
MCHC: 33.2 g/dL (ref 30.0–36.0)
MCV: 95 fL (ref 80.0–100.0)
Platelets: 364 10*3/uL (ref 150–400)
RBC: 5.55 MIL/uL — ABNORMAL HIGH (ref 3.87–5.11)
RDW: 20.2 % — ABNORMAL HIGH (ref 11.5–15.5)
WBC: 17 10*3/uL — ABNORMAL HIGH (ref 4.0–10.5)
nRBC: 0 % (ref 0.0–0.2)

## 2021-05-23 LAB — COMPREHENSIVE METABOLIC PANEL
ALT: 49 U/L — ABNORMAL HIGH (ref 0–44)
AST: 76 U/L — ABNORMAL HIGH (ref 15–41)
Albumin: 4.7 g/dL (ref 3.5–5.0)
Alkaline Phosphatase: 104 U/L (ref 38–126)
Anion gap: 16 — ABNORMAL HIGH (ref 5–15)
BUN: 11 mg/dL (ref 6–20)
CO2: 24 mmol/L (ref 22–32)
Calcium: 10.6 mg/dL — ABNORMAL HIGH (ref 8.9–10.3)
Chloride: 97 mmol/L — ABNORMAL LOW (ref 98–111)
Creatinine, Ser: 1.78 mg/dL — ABNORMAL HIGH (ref 0.44–1.00)
GFR, Estimated: 40 mL/min — ABNORMAL LOW (ref >60)
Glucose, Bld: 114 mg/dL — ABNORMAL HIGH (ref 70–99)
Potassium: 3.3 mmol/L — ABNORMAL LOW (ref 3.5–5.1)
Sodium: 137 mmol/L (ref 135–145)
Total Bilirubin: 2.2 mg/dL — ABNORMAL HIGH (ref 0.3–1.2)
Total Protein: 8.8 g/dL — ABNORMAL HIGH (ref 6.5–8.1)

## 2021-05-23 LAB — RESP PANEL BY RT-PCR (FLU A&B, COVID) ARPGX2
Influenza A by PCR: NEGATIVE
Influenza B by PCR: NEGATIVE
SARS Coronavirus 2 by RT PCR: NEGATIVE

## 2021-05-23 LAB — I-STAT BETA HCG BLOOD, ED (MC, WL, AP ONLY): I-stat hCG, quantitative: 11.8 m[IU]/mL — ABNORMAL HIGH (ref <5)

## 2021-05-23 LAB — URINALYSIS, ROUTINE W REFLEX MICROSCOPIC
Glucose, UA: NEGATIVE mg/dL
Ketones, ur: 20 mg/dL — AB
Nitrite: NEGATIVE
Protein, ur: 100 mg/dL — AB
Specific Gravity, Urine: 1.03 (ref 1.005–1.030)
pH: 5 (ref 5.0–8.0)

## 2021-05-23 LAB — BASIC METABOLIC PANEL
Anion gap: 10 (ref 5–15)
BUN: 11 mg/dL (ref 6–20)
CO2: 25 mmol/L (ref 22–32)
Calcium: 8.9 mg/dL (ref 8.9–10.3)
Chloride: 101 mmol/L (ref 98–111)
Creatinine, Ser: 1.21 mg/dL — ABNORMAL HIGH (ref 0.44–1.00)
GFR, Estimated: >60 mL/min (ref >60)
Glucose, Bld: 77 mg/dL (ref 70–99)
Potassium: 3.9 mmol/L (ref 3.5–5.1)
Sodium: 136 mmol/L (ref 135–145)

## 2021-05-23 LAB — LACTIC ACID, PLASMA
Lactic Acid, Venous: 2.3 mmol/L (ref 0.5–1.9)
Lactic Acid, Venous: 0.9 mmol/L (ref 0.5–1.9)

## 2021-05-23 LAB — MAGNESIUM: Magnesium: 2 mg/dL (ref 1.7–2.4)

## 2021-05-23 LAB — HCG, QUANTITATIVE, PREGNANCY: hCG, Beta Chain, Quant, S: 2 m[IU]/mL (ref <5)

## 2021-05-23 LAB — LIPASE, BLOOD: Lipase: 22 U/L (ref 11–51)

## 2021-05-23 MED ORDER — LACTATED RINGERS IV BOLUS
1000.0000 mL | Freq: Once | INTRAVENOUS | Status: AC
  Administered 2021-05-23: 1000 mL via INTRAVENOUS

## 2021-05-23 MED ORDER — DROPERIDOL 2.5 MG/ML IJ SOLN
2.5000 mg | Freq: Once | INTRAMUSCULAR | Status: AC
  Administered 2021-05-23: 2.5 mg via INTRAVENOUS
  Filled 2021-05-23: qty 2

## 2021-05-23 MED ORDER — FENTANYL CITRATE PF 50 MCG/ML IJ SOSY
50.0000 ug | PREFILLED_SYRINGE | Freq: Once | INTRAMUSCULAR | Status: AC
  Administered 2021-05-23: 50 ug via INTRAVENOUS
  Filled 2021-05-23: qty 1

## 2021-05-23 MED ORDER — ONDANSETRON 4 MG PO TBDP: 4.0000 mg | ORAL_TABLET | Freq: Three times a day (TID) | ORAL | 0 refills | Status: AC | PRN

## 2021-05-23 MED ORDER — POTASSIUM CHLORIDE 10 MEQ/100ML IV SOLN
10.0000 meq | Freq: Once | INTRAVENOUS | Status: AC
  Administered 2021-05-23: 10 meq via INTRAVENOUS
  Filled 2021-05-23: qty 100

## 2021-05-23 MED ORDER — ONDANSETRON HCL 4 MG/2ML IJ SOLN
4.0000 mg | Freq: Once | INTRAMUSCULAR | Status: AC
  Administered 2021-05-23: 4 mg via INTRAVENOUS
  Filled 2021-05-23: qty 2

## 2021-05-23 NOTE — ED Notes (Signed)
Lab called with critical value: Lactic 2.3. MD notified.

## 2021-05-23 NOTE — ED Triage Notes (Signed)
Pt to triage via [facility] from home.  Reports lower abd pain, nausea, and vomiting x 3-4 days.  EMS- 20g LAC Zofran 4mg  IV  CBG 135

## 2021-05-23 NOTE — Discharge Instructions (Addendum)
Dear Angela Christian,  Thank you for allowing [place] to take care of you today.  We hope you begin feeling better soon.  - Please follow-up with your primary care physician or schedule an appointment to establish a primary care doctor if you do not have one already. - Please return to the Emergency Department or call 911 for chest pain, shortness of breath, severe pain, altered mental status, or if you have any reason to think you may need emergency medical care. -Continue your normal home medications as previously ordered -Remember to stay well-hydrated and increase diet as tolerated.  May consider starting with bland foods, such as rice, toast, or applesauce. -Call your PCP for close follow-up -Cannabis is likely contributing to your repeat nausea and vomiting episodes.  Consider decreasing or stopping use of marijuana. -May use Zofran as directed as needed for nausea or vomiting for the next few days.  You will need to follow-up closely with your PCP to discuss long-term management of your hyperemesis.   Sincerely,  Dwaine Gale, DO Department of Emergency Medicine [place]   Cannabinoid hyperemesis syndrome

## 2021-05-23 NOTE — ED Notes (Signed)
Pt ambulatory to the bathroom and pt can be heard inside vomiting from the nurses station. Pt comes out and states she is vomiting again and needs something for it. MD notified.

## 2021-05-23 NOTE — ED Notes (Signed)
ED Provider at bedside. 

## 2021-05-23 NOTE — ED Notes (Signed)
Mother Geraldine Contras [phone] would like an update

## 2021-05-23 NOTE — ED Notes (Signed)
Pt began sipping on water before D/C. States she now feels nauseated. MD notified.

## 2021-05-23 NOTE — ED Provider Notes (Signed)
Angela Christian   CSN: [id number] Arrival date & time: 05/23/21  4193     History Chief Complaint  Patient presents with   Abdominal Pain   Vomiting    Angela Christian is a 25 y.o. female.  The history is provided by the patient and medical records.  Abdominal Pain Associated symptoms: constipation, diarrhea, fatigue, nausea, vaginal bleeding and vomiting   Associated symptoms: no chest pain, no chills, no cough, no dysuria, no fever, no hematuria, no shortness of breath, no sore throat and no vaginal discharge    25 year old female with PMH significant for bipolar disorder, clinical vomiting syndrome, cannabis use, schizophrenia, and others as below who presents to the ED with complaints of abdominal pain and emesis.  Patient reports that for the past 3 days, she has had worsening of her cyclic vomiting associated with significant emesis, diarrhea, nausea, and lower abdominal pain.  She has been using her home Unisom without relief.  She has not been able to tolerate p.o.  Endorses associated intermittent emesis and lightheadedness.  She has been unable to sleep.  She reports being 3 days late on her menstrual cycle this month, does not use birth control.  She began having small vaginal bleeding today.  She is s/p cesarean section in June 2022.  She denies any hematuria, dysuria, flank pain, vaginal discharge or odor, or any other GU complaints.  She denies any dizziness or lightheadedness, syncope, chest pain or shortness of breath, abdominal distention, melena or hematochezia, or any other concerns.  Reports consistent use of cannabis, last use this morning.  Past Medical History:  Diagnosis Date   Anxiety    Asthma    Bipolar disease, manic (HCC)    Cyclical vomiting syndrome    Depression    Dyspnea    Headache    Recurrent upper respiratory infection (URI)    Schizophrenia (HCC)     Patient Active Problem List   Diagnosis  Date Noted   Schizoaffective disorder (HCC) 12/26/2020   Cesarean delivery, delivered, current hospitalization 12/26/2020   Encounter for induction of labor 12/26/2020   H/O anxiety disorder 12/26/2020   Acute blood loss anemia 12/26/2020   IDA (iron deficiency anemia) 12/25/2020   IUGR (intrauterine growth restriction) affecting care of mother 12/24/2020   Cannabis use without complication 12/24/2020   Postpartum care following cesarean delivery 12/24/2020   Major depressive disorder, recurrent episode (HCC) 09/10/2015    Past Surgical History:  Procedure Laterality Date   CESAREAN SECTION N/A 12/24/2020   Procedure: CESAREAN SECTION;  Surgeon: Hoover Browns, MD;  Location: [facility] LD [facility]S;  Service: Obstetrics;  Laterality: N/A;   TONSILLECTOMY       OB History     Gravida  2   Para  2   Term  2   Preterm  0   AB  0   Living  2      SAB  0   IAB  0   Ectopic  0   Multiple  0   Live Births  2           Family History  Problem Relation Age of Onset   Hypertension Mother    Seizures Maternal Uncle    Breast cancer Paternal Aunt    Kidney disease Maternal Grandmother     Social History   Tobacco Use   Smoking status: Former    Packs/day: 0.00    Types: Cigarettes  Quit date: 10/08/2017    Years since quitting: 3.6   Smokeless tobacco: Never  Vaping Use   Vaping Use: Never used  Substance Use Topics   Alcohol use: Not Currently    Alcohol/week: 14.0 standard drinks    Types: 14 Standard drinks or equivalent per week    Comment: daily   Drug use: Yes    Types: Marijuana    Comment: 6.2.2022    Home Medications Prior to Admission medications   Medication Sig Start Date End Date Taking? Authorizing Provider  ondansetron (ZOFRAN ODT) 4 MG disintegrating tablet Take 1 tablet (4 mg total) by mouth every 8 (eight) hours as needed for up to 2 days for nausea or vomiting. 05/23/21 05/25/21 Yes , Dakwon Wenberg, DO  acetaminophen (TYLENOL) 500 MG tablet  Take 2 tablets (1,000 mg total) by mouth every 6 (six) hours. 12/27/20   Arrie Eastern, CNM  albuterol (VENTOLIN HFA) 108 (90 Base) MCG/ACT inhaler Inhale 2 puffs into the lungs every 6 (six) hours as needed for wheezing or shortness of breath. 06/26/20   Hall-Potvin, [place], PA-C  ARIPiprazole (ABILIFY) 5 MG tablet Take 1 tablet (5 mg total) by mouth at bedtime. 12/27/20 04/26/21  Arrie Eastern, CNM  ibuprofen (ADVIL) 600 MG tablet Take 1 tablet (600 mg total) by mouth every 6 (six) hours. 12/27/20   Arrie Eastern, CNM  Prenatal Vit-Fe Fumarate-FA (PRENATAL MULTIVITAMIN) TABS tablet Take 1 tablet by mouth daily at 12 noon.    [provider]  sertraline (ZOLOFT) 50 MG tablet Take 1 tablet (50 mg total) by mouth daily. 12/28/20 04/27/21  Arrie Eastern, CNM  omeprazole (PRILOSEC) 20 MG capsule Take 1 capsule (20 mg total) by mouth daily. 11/20/19 12/11/19  Darr, Edison Nasuti, PA-C    Allergies    Patient has no known allergies.  Review of Systems   Review of Systems  Constitutional:  Positive for activity change, appetite change and fatigue. Negative for chills and fever.  HENT:  Negative for ear pain and sore throat.   Eyes:  Negative for pain and visual disturbance.  Respiratory:  Negative for cough and shortness of breath.   Cardiovascular:  Negative for chest pain and palpitations.  Gastrointestinal:  Positive for abdominal pain, constipation, diarrhea, nausea and vomiting. Negative for abdominal distention and blood in stool.  Endocrine: Negative for polyuria.  Genitourinary:  Positive for vaginal bleeding. Negative for decreased urine volume, difficulty urinating, dysuria, flank pain, hematuria, pelvic pain, urgency, vaginal discharge and vaginal pain.  Musculoskeletal:  Negative for neck pain and neck stiffness.  Skin:  Negative for color change, pallor and rash.  Allergic/Immunologic: Negative for immunocompromised state.  Neurological:  Positive for light-headedness. Negative for  dizziness, seizures, syncope, weakness and numbness.  Hematological:  Does not bruise/bleed easily.  Psychiatric/Behavioral:  Negative for confusion. The patient is not nervous/anxious.   All other systems reviewed and are negative.  Physical Exam Updated Vital Signs BP 106/67   Pulse 60   Temp 98.4 F (36.9 C) (Oral)   Resp 20   SpO2 99%   Physical Exam Vitals and nursing Christian reviewed.  Constitutional:      General: She is in acute distress.     Appearance: She is well-developed and normal weight. She is ill-appearing. She is not toxic-appearing or diaphoretic.     Comments: Writhing on the bed crying, refuses to lie flat  HENT:     Head: Normocephalic and atraumatic.     Right Ear: External ear  normal.     Left Ear: External ear normal.     Nose: Nose normal.     Mouth/Throat:     Mouth: Mucous membranes are dry.     Pharynx: Oropharynx is clear.  Eyes:     General: No scleral icterus.    Extraocular Movements: Extraocular movements intact.     Conjunctiva/sclera: Conjunctivae normal.     Pupils: Pupils are equal, round, and reactive to light.  Cardiovascular:     Rate and Rhythm: Regular rhythm. Tachycardia present.     Pulses: Normal pulses.     Heart sounds: Normal heart sounds. No murmur heard. Pulmonary:     Effort: Pulmonary effort is normal. No respiratory distress.     Breath sounds: Normal breath sounds. No wheezing.  Abdominal:     General: Abdomen is flat. There is no distension.     Palpations: Abdomen is soft. There is no hepatomegaly, splenomegaly or mass.     Tenderness: There is generalized abdominal tenderness. There is no right CVA tenderness, left CVA tenderness, guarding or rebound. Negative signs include Murphy's sign, Rovsing's sign and McBurney's sign.     Hernia: No hernia is present.  Musculoskeletal:        General: Normal range of motion.     Cervical back: Normal range of motion and neck supple. No rigidity or tenderness.     Right lower  leg: No edema.     Left lower leg: No edema.  Lymphadenopathy:     Cervical: No cervical adenopathy.  Skin:    General: Skin is warm and dry.     Capillary Refill: Capillary refill takes less than 2 seconds.  Neurological:     General: No focal deficit present.     Mental Status: She is alert and oriented to person, place, and time. Mental status is at baseline.  Psychiatric:        Mood and Affect: Mood is anxious.        Behavior: Behavior is agitated. Behavior is cooperative.    ED Results / Procedures / Treatments   Labs (all labs ordered are listed, but only abnormal results are displayed) Labs Reviewed  COMPREHENSIVE METABOLIC PANEL - Abnormal; Notable for the following components:      Result Value   Potassium 3.3 (*)    Chloride 97 (*)    Glucose, Bld 114 (*)    Creatinine, Ser 1.78 (*)    Calcium 10.6 (*)    Total Protein 8.8 (*)    AST 76 (*)    ALT 49 (*)    Total Bilirubin 2.2 (*)    GFR, Estimated 40 (*)    Anion gap 16 (*)    All other components within normal limits  CBC - Abnormal; Notable for the following components:   WBC 17.0 (*)    RBC 5.55 (*)    Hemoglobin 17.5 (*)    HCT 52.7 (*)    RDW 20.2 (*)    All other components within normal limits  URINALYSIS, ROUTINE W REFLEX MICROSCOPIC - Abnormal; Notable for the following components:   Color, Urine AMBER (*)    APPearance HAZY (*)    Hgb urine dipstick MODERATE (*)    Bilirubin Urine SMALL (*)    Ketones, ur 20 (*)    Protein, ur 100 (*)    Leukocytes,Ua SMALL (*)    Bacteria, UA RARE (*)    Non Squamous Epithelial 0-5 (*)    All other components within  normal limits  LACTIC ACID, PLASMA - Abnormal; Notable for the following components:   Lactic Acid, Venous 2.3 (*)    All other components within normal limits  BASIC METABOLIC PANEL - Abnormal; Notable for the following components:   Creatinine, Ser 1.21 (*)    All other components within normal limits  I-STAT BETA HCG BLOOD, ED ([facility], [facility], [facility]  ONLY) - Abnormal; Notable for the following components:   I-stat hCG, quantitative 11.8 (*)    All other components within normal limits  RESP PANEL BY RT-PCR (FLU A&B, COVID) ARPGX2  LIPASE, BLOOD  LACTIC ACID, PLASMA  MAGNESIUM  HCG, QUANTITATIVE, PREGNANCY    EKG None  Radiology No results found.  Procedures Procedures   Medications Ordered in ED Medications  lactated ringers bolus 1,000 mL (0 mLs Intravenous Stopped 05/23/21 1956)  fentaNYL (SUBLIMAZE) injection 50 mcg (50 mcg Intravenous Given 05/23/21 1804)  ondansetron (ZOFRAN) injection 4 mg (4 mg Intravenous Given 05/23/21 1804)  potassium chloride 10 mEq in 100 mL IVPB (0 mEq Intravenous Stopped 05/23/21 2024)  droperidol (INAPSINE) 2.5 MG/ML injection 2.5 mg (2.5 mg Intravenous Given 05/23/21 1854)  lactated ringers bolus 1,000 mL (0 mLs Intravenous Stopped 05/23/21 2135)    ED Course  I have reviewed the triage vital signs and the nursing notes.  Pertinent labs & imaging results that were available during my care of the patient were reviewed by me and considered in my medical decision making (see chart for details).    MDM Rules/Calculators/A&P                           Angela Christian is a 25 y.o. female presenting with abdominal pain and vomiting. Initial VS sig for HTN and tachycardia.  EKG interpretation: (post droperidol) NSR, rate 67 bpm, normal intervals, normal QTC.  Biatrial enlargement noted, no ST depressions, early repolarization in leads V1 through V2 without reciprocal changes.  Labs: COVID/flu negative.  hCG 2.  UA with small ketonuria, proteinuria, and moderate blood.  Rare bacteria and negative nitrite.  Patient states she started her menses today.  She will lactic acidosis, improved to normal limits after fluids.  Magnesium WNL.  Lipase WNL.  Leukocytosis and hemoconcentration.  Mild hypokalemia and AKI with creatinine of 1.78 and mild transaminitis, T bili 2.2.  Repeat BMP with improving  creatinine.  Imaging: Not indicated  DDX considered: Gastroenteritis, IUP, ectopic pregnancy, appendicitis, cholecystitis, intra-abdominal infection, gastroparesis. History, examination, and objective data most consistent with cannabinoid hyperemesis syndrome with AKI mild dehydration.  Abdominal exam benign on repeat evaluations after pain control.  hCG negative.  Low suspicion for surgical abdominal emergency.  No systemic infectious symptoms, suspect leukocytosis is secondary to repeated emesis.  AKI appears to be improving  Medications: Medications  lactated ringers bolus 1,000 mL (0 mLs Intravenous Stopped 05/23/21 1956)  fentaNYL (SUBLIMAZE) injection 50 mcg (50 mcg Intravenous Given 05/23/21 1804)  ondansetron (ZOFRAN) injection 4 mg (4 mg Intravenous Given 05/23/21 1804)  potassium chloride 10 mEq in 100 mL IVPB (0 mEq Intravenous Stopped 05/23/21 2024)  droperidol (INAPSINE) 2.5 MG/ML injection 2.5 mg (2.5 mg Intravenous Given 05/23/21 1854)  lactated ringers bolus 1,000 mL (0 mLs Intravenous Stopped 05/23/21 2135)     Re-evaluated after interventions. Hemodynamically stable and in no acute distress.  Sleeping on multiple repeat examinations.  At the point of discharge, the patient began vomiting again.  Informed the patient I would not be giving  any further QT prolonging agents at this time due to risk of arrhythmia.  Patient tolerating p.o., ambulates without difficulty.  Patient crying and asking for nausea medication, on repeat examination patient is sleeping again comfortably.  Discharged home in stable condition.  Rx short-term Zofran.  Strict ED return precautions advised. Supportive care discussed. Outpatient PCP follow-up advised.  Advised discontinuing cannabis use.  Patient understands and agrees with the plan.  Attempted multiple times to update family members/friends who were present at the bedside per their request, but anytime I presented to update them, they had left the  bedside.  The plan for this patient was discussed with my attending physician, who voiced agreement and who oversaw evaluation and treatment of this patient.     Christian: Estate manager/land agent was used in the creation of this Christian.  Final Clinical Impression(s) / ED Diagnoses Final diagnoses:  Cannabinoid hyperemesis syndrome    Rx / DC Orders ED Discharge Orders          Ordered    ondansetron (ZOFRAN ODT) 4 MG disintegrating tablet  Every 8 hours PRN        05/23/21 2335             Cherly Hensen, DO 05/24/21 0059    Noemi Chapel, MD 05/24/21 2223

## 2021-05-24 MED ORDER — LORAZEPAM 2 MG/ML IJ SOLN: 1.0000 mg | Freq: Once | INTRAMUSCULAR | Status: DC

## 2021-05-24 NOTE — ED Notes (Signed)
ED Provider at bedside. 

## 2021-08-24 ENCOUNTER — Telehealth (HOSPITAL_COMMUNITY): Payer: Self-pay | Admitting: Licensed Clinical Social Worker

## 2021-10-27 ENCOUNTER — Ambulatory Visit (INDEPENDENT_AMBULATORY_CARE_PROVIDER_SITE_OTHER): Payer: Medicaid Other | Admitting: Internal Medicine

## 2021-10-27 ENCOUNTER — Encounter: Payer: Self-pay | Admitting: Internal Medicine

## 2021-10-27 VITALS — BP 102/70 | HR 100 | Ht 64.5 in | Wt 96.6 lb

## 2021-10-27 DIAGNOSIS — E0590 Thyrotoxicosis, unspecified without thyrotoxic crisis or storm: Secondary | ICD-10-CM | POA: Diagnosis not present

## 2021-10-27 LAB — T4, FREE: Free T4: 0.71 ng/dL (ref 0.60–1.60)

## 2021-10-27 LAB — TSH: TSH: 0.41 u[IU]/mL (ref 0.35–5.50)

## 2021-10-27 NOTE — Progress Notes (Signed)
? ? ?Name: Angela Christian  ?MRN/ DOB: [id number], 30-Oct-1995    ?Age/ Sex: 26 y.o., female   ? ?PCP: [facility]   ?Reason for Endocrinology Evaluation: Low TSH  ?   ?Date of Initial Endocrinology Evaluation: 10/27/2021   ? ? ?HPI: ?Angela Christian is a 25 y.o. female with a past medical history of bipolar disorder, migraine, dyslipidemia. The patient presented for initial endocrinology clinic visit on 10/27/2021 for consultative assistance with her low TSH.  ? ?She has been noted with a low TSH 0.292 u IU/mL during routine work-up in 03/2021, with normal free T4 at 1.1 NG/DL and low total T3 at 71.24 NG/DL. ? ? ?Per pt she was first noted with thyroid disease during pregnancy , no prior treatment  ? ?She is concerned about weight loss  ?She continues with nausea and occasional vomiting, appetite has been variable , she continues to use cannabis . Pt states her GI issues have been prior to cannabis use  ? ?She endorses eye bulging  ?She has occasional palpitations  ?Has tremors  ?She has heat and cold intolerance  ?No local neck symptoms  ? ? ?She is S/P C-section 12/24/2020 ? ?Sister with Luiz Blare' disease  ? ?HISTORY:  ?Past Medical History:  ?Past Medical History:  ?Diagnosis Date  ? Anxiety   ? Asthma   ? Bipolar disease, manic (HCC)   ? Cyclical vomiting syndrome   ? Depression   ? Dyspnea   ? Headache   ? Recurrent upper respiratory infection (URI)   ? Schizophrenia (HCC)   ? ?Past Surgical History:  ?Past Surgical History:  ?Procedure Laterality Date  ? CESAREAN SECTION N/A 12/24/2020  ? Procedure: CESAREAN SECTION;  Surgeon: Hoover Browns, MD;  [facility]: [facility];  Service: Obstetrics;  Laterality: N/A;  ? TONSILLECTOMY    ?  ?Social History:  reports that she quit smoking about 4 years ago. Her smoking use included cigarettes. She has never used smokeless tobacco. She reports that she does not currently use alcohol after a past usage of about 14.0 standard drinks per week. She reports current drug  use. Drug: Marijuana. ?Family History: family history includes Breast cancer in her paternal aunt; Hypertension in her mother; Kidney disease in her maternal grandmother; Seizures in her maternal uncle. ? ? ?HOME MEDICATIONS: ?Allergies as of 10/27/2021   ?No Known Allergies ?  ? ?  ?Medication List  ?  ? ?  ? Accurate as of October 27, 2021  2:30 PM. If you have any questions, ask your nurse or doctor.  ?  ?  ? ?  ? ?acetaminophen 500 MG tablet ?Commonly known as: TYLENOL ?Take 2 tablets (1,000 mg total) by mouth every 6 (six) hours. ?  ?albuterol 108 (90 Base) MCG/ACT inhaler ?Commonly known as: VENTOLIN HFA ?Inhale 2 puffs into the lungs every 6 (six) hours as needed for wheezing or shortness of breath. ?  ?ARIPiprazole 5 MG tablet ?Commonly known as: ABILIFY ?Take 1 tablet (5 mg total) by mouth at bedtime. ?  ?diazepam 10 MG tablet ?Commonly known as: VALIUM ?Take 10 mg by mouth 2 (two) times daily as needed. ?  ?ibuprofen 600 MG tablet ?Commonly known as: ADVIL ?Take 1 tablet (600 mg total) by mouth every 6 (six) hours. ?  ?naltrexone 50 MG tablet ?Commonly known as: DEPADE ?Take 50 mg by mouth daily. ?  ?ondansetron 4 MG disintegrating tablet ?Commonly known as: ZOFRAN-ODT ?Take 4 mg by mouth 4 (  four) times daily as needed. ?  ?pantoprazole 40 MG tablet ?Commonly known as: PROTONIX ?Take 40 mg by mouth daily. ?  ?prenatal multivitamin Tabs tablet ?Take 1 tablet by mouth daily at 12 noon. ?  ?Rexulti 0.5 MG Tabs ?Generic drug: Brexpiprazole ?Take 1 tablet by mouth daily. ?  ?sertraline 50 MG tablet ?Commonly known as: ZOLOFT ?Take 1 tablet (50 mg total) by mouth daily. ?  ? ?  ?  ? ? ?REVIEW OF SYSTEMS: ?A comprehensive ROS was conducted with the patient and is negative except as per HPI  ? ? ? ?OBJECTIVE:  ?VS: BP 102/70 (BP Location: Left Arm, Patient Position: Sitting, Cuff Size: Small)   Pulse 100   Ht 5' 4.5" (1.638 m)   Wt 96 lb 9.6 oz (43.8 kg)   SpO2 99%   BMI 16.33 kg/m?   ? ?Wt Readings from Last 3  Encounters:  ?10/27/21 96 lb 9.6 oz (43.8 kg)  ?12/24/20 136 lb 9.6 oz (62 kg)  ?12/08/20 123 lb 3.2 oz (55.9 kg)  ? ? ? ?EXAM: ?General: Pt appears well and is in NAD  ?Eyes: External eye exam normal without stare, lid lag or exophthalmos.  EOM intact.  PERRL.  ?Neck: General: Supple without adenopathy. ?Thyroid: Thyroid size normal.  No goiter or nodules appreciated. No thyroid bruit.  ?Lungs: Clear with good BS bilat with no rales, rhonchi, or wheezes  ?Heart: Auscultation: RRR.  ?Abdomen: Soft but tender   ?Extremities:  ?BL LE: No pretibial edema normal ROM and strength.  ?Mental Status: Judgment, insight: Intact ?Orientation: Oriented to time, place, and person ?Mood and affect: No depression, anxiety, or agitation  ? ? ? ?DATA REVIEWED: ? ? Latest Reference Range & Units 10/27/21 14:30  ?TSH 0.35 - 5.50 uIU/mL 0.41  ?Triiodothyronine (T3) 76 - 181 ng/dL 74 (L)  ?X7,LTJQ(ZESPQZ) 0.60 - 1.60 ng/dL 3.00  ? ?  ? ?ASSESSMENT/PLAN/RECOMMENDATIONS:  ? ?Subclinical hyperthyroidism: ? ?- Pt with multiple symptoms that at this time is not attributed to her thyroid since her TSH and free T4 are normal. ?-Patient has been noted with a low total T3, I suspect this is due to low thyroxine binding globulin given low appetite and most likely low protein/albumin. ? ?-I also suspect that the reason for her low TSH in the past could be due to painless thyroiditis as this was noted within 3 months of her last delivery. ? ?-I would recommend annual TFTs through her PCP, we will be glad to follow-up on the patient if her TFTs become abnormal again ? ?2. Weight loss/N/V :  ? ?- I have advised the pt to avoid cannabis use as it exacerbates her GI symptoms  ?- I have advised her to follow up with GI  ? ? ? ? ? ?Signed electronically by: ?Abby Raelyn Mora, MD ? ?[facility]  ?[facility] ?[address] ?[place], [place] [address] ?Phone: [phone] ?FAX: [phone] ? ? ?CC: ?[facility] ?[address] ?[place] [address] ?Phone: [phone] ?Fax: [phone] ? ? ?Return to Endocrinology clinic as below: ?No future appointments. ?  ? ? ? ? ? ?

## 2021-10-30 LAB — TRAB (TSH RECEPTOR BINDING ANTIBODY): TRAB: <1 IU/L (ref <=2.00)

## 2021-10-30 LAB — T3: T3, Total: 74 ng/dL — ABNORMAL LOW (ref 76–181)

## 2022-02-25 ENCOUNTER — Observation Stay (HOSPITAL_COMMUNITY)
Admit: 2022-02-25 | Discharge: 2022-02-25 | Disposition: A | Discharge status: Home / Self Care | Payer: Self-pay | Attending: Emergency Medicine | Admitting: Emergency Medicine

## 2022-02-25 ENCOUNTER — Encounter (HOSPITAL_COMMUNITY): Payer: Self-pay

## 2022-02-25 ENCOUNTER — Other Ambulatory Visit: Payer: Self-pay

## 2022-02-25 ENCOUNTER — Observation Stay (HOSPITAL_COMMUNITY): Payer: Self-pay

## 2022-02-25 ENCOUNTER — Inpatient Hospital Stay (HOSPITAL_COMMUNITY)
Admission: EM | Admit: 2022-02-25 | Discharge: 2022-02-27 | DRG: 897 | Disposition: A | Discharge status: Home / Self Care | Payer: Self-pay | Attending: Internal Medicine | Admitting: Internal Medicine

## 2022-02-25 ENCOUNTER — Emergency Department (HOSPITAL_COMMUNITY): Payer: Self-pay

## 2022-02-25 DIAGNOSIS — D7589 Other specified diseases of blood and blood-forming organs: Secondary | ICD-10-CM | POA: Diagnosis present

## 2022-02-25 DIAGNOSIS — F10930 Alcohol use, unspecified with withdrawal, uncomplicated: Principal | ICD-10-CM

## 2022-02-25 DIAGNOSIS — D696 Thrombocytopenia, unspecified: Secondary | ICD-10-CM

## 2022-02-25 DIAGNOSIS — Z803 Family history of malignant neoplasm of breast: Secondary | ICD-10-CM

## 2022-02-25 DIAGNOSIS — F1910 Other psychoactive substance abuse, uncomplicated: Secondary | ICD-10-CM

## 2022-02-25 DIAGNOSIS — S80212A Abrasion, left knee, initial encounter: Secondary | ICD-10-CM | POA: Diagnosis present

## 2022-02-25 DIAGNOSIS — R319 Hematuria, unspecified: Secondary | ICD-10-CM | POA: Diagnosis present

## 2022-02-25 DIAGNOSIS — M25562 Pain in left knee: Secondary | ICD-10-CM | POA: Diagnosis present

## 2022-02-25 DIAGNOSIS — Z79899 Other long term (current) drug therapy: Secondary | ICD-10-CM

## 2022-02-25 DIAGNOSIS — F17210 Nicotine dependence, cigarettes, uncomplicated: Secondary | ICD-10-CM | POA: Diagnosis present

## 2022-02-25 DIAGNOSIS — S01551A Open bite of lip, initial encounter: Secondary | ICD-10-CM | POA: Diagnosis present

## 2022-02-25 DIAGNOSIS — D6959 Other secondary thrombocytopenia: Secondary | ICD-10-CM | POA: Diagnosis present

## 2022-02-25 DIAGNOSIS — F10939 Alcohol use, unspecified with withdrawal, unspecified: Secondary | ICD-10-CM

## 2022-02-25 DIAGNOSIS — F10139 Alcohol abuse with withdrawal, unspecified: Principal | ICD-10-CM | POA: Diagnosis present

## 2022-02-25 DIAGNOSIS — F259 Schizoaffective disorder, unspecified: Secondary | ICD-10-CM | POA: Diagnosis present

## 2022-02-25 DIAGNOSIS — S80211A Abrasion, right knee, initial encounter: Secondary | ICD-10-CM | POA: Diagnosis present

## 2022-02-25 DIAGNOSIS — Z8249 Family history of ischemic heart disease and other diseases of the circulatory system: Secondary | ICD-10-CM

## 2022-02-25 DIAGNOSIS — F12129 Cannabis abuse with intoxication, unspecified: Secondary | ICD-10-CM | POA: Diagnosis present

## 2022-02-25 DIAGNOSIS — R569 Unspecified convulsions: Secondary | ICD-10-CM

## 2022-02-25 DIAGNOSIS — W19XXXA Unspecified fall, initial encounter: Secondary | ICD-10-CM | POA: Diagnosis present

## 2022-02-25 DIAGNOSIS — Z8659 Personal history of other mental and behavioral disorders: Secondary | ICD-10-CM

## 2022-02-25 DIAGNOSIS — F339 Major depressive disorder, recurrent, unspecified: Secondary | ICD-10-CM | POA: Diagnosis present

## 2022-02-25 DIAGNOSIS — F419 Anxiety disorder, unspecified: Secondary | ICD-10-CM | POA: Diagnosis present

## 2022-02-25 DIAGNOSIS — K7030 Alcoholic cirrhosis of liver without ascites: Secondary | ICD-10-CM | POA: Diagnosis present

## 2022-02-25 DIAGNOSIS — A549 Gonococcal infection, unspecified: Secondary | ICD-10-CM | POA: Diagnosis present

## 2022-02-25 LAB — COMPREHENSIVE METABOLIC PANEL
ALT: 34 U/L (ref 0–44)
AST: 57 U/L — ABNORMAL HIGH (ref 15–41)
Albumin: 4.1 g/dL (ref 3.5–5.0)
Alkaline Phosphatase: 88 U/L (ref 38–126)
Anion gap: 11 (ref 5–15)
BUN: 8 mg/dL (ref 6–20)
CO2: 25 mmol/L (ref 22–32)
Calcium: 9.8 mg/dL (ref 8.9–10.3)
Chloride: 105 mmol/L (ref 98–111)
Creatinine, Ser: 0.93 mg/dL (ref 0.44–1.00)
GFR, Estimated: >60 mL/min (ref >60)
Glucose, Bld: 120 mg/dL — ABNORMAL HIGH (ref 70–99)
Potassium: 3.9 mmol/L (ref 3.5–5.1)
Sodium: 141 mmol/L (ref 135–145)
Total Bilirubin: 1.6 mg/dL — ABNORMAL HIGH (ref 0.3–1.2)
Total Protein: 7.6 g/dL (ref 6.5–8.1)

## 2022-02-25 LAB — CBC WITH DIFFERENTIAL/PLATELET
Abs Immature Granulocytes: 0.02 10*3/uL (ref 0.00–0.07)
Basophils Absolute: 0.1 10*3/uL (ref 0.0–0.1)
Basophils Relative: 1 %
Eosinophils Absolute: 0 10*3/uL (ref 0.0–0.5)
Eosinophils Relative: 0 %
HCT: 42.1 % (ref 36.0–46.0)
Hemoglobin: 14 g/dL (ref 12.0–15.0)
Immature Granulocytes: 0 %
Lymphocytes Relative: 5 %
Lymphs Abs: 0.5 10*3/uL — ABNORMAL LOW (ref 0.7–4.0)
MCH: 34.1 pg — ABNORMAL HIGH (ref 26.0–34.0)
MCHC: 33.3 g/dL (ref 30.0–36.0)
MCV: 102.7 fL — ABNORMAL HIGH (ref 80.0–100.0)
Monocytes Absolute: 0.3 10*3/uL (ref 0.1–1.0)
Monocytes Relative: 3 %
Neutro Abs: 9.2 10*3/uL — ABNORMAL HIGH (ref 1.7–7.7)
Neutrophils Relative %: 91 %
Platelets: 138 10*3/uL — ABNORMAL LOW (ref 150–400)
RBC: 4.1 MIL/uL (ref 3.87–5.11)
RDW: 14.6 % (ref 11.5–15.5)
WBC: 10.1 10*3/uL (ref 4.0–10.5)
nRBC: 0 % (ref 0.0–0.2)

## 2022-02-25 LAB — CBG MONITORING, ED: Glucose-Capillary: 181 mg/dL — ABNORMAL HIGH (ref 70–99)

## 2022-02-25 LAB — URINALYSIS, ROUTINE W REFLEX MICROSCOPIC
Bilirubin Urine: NEGATIVE
Glucose, UA: >=500 mg/dL — AB
Hgb urine dipstick: NEGATIVE
Ketones, ur: 20 mg/dL — AB
Leukocytes,Ua: NEGATIVE
Nitrite: NEGATIVE
Protein, ur: >=300 mg/dL — AB
Specific Gravity, Urine: 1.021 (ref 1.005–1.030)
pH: 6 (ref 5.0–8.0)

## 2022-02-25 LAB — RAPID HIV SCREEN (HIV 1/2 AB+AG)
HIV 1/2 Antibodies: NONREACTIVE
HIV-1 P24 Antigen - HIV24: NONREACTIVE

## 2022-02-25 LAB — PHOSPHORUS: Phosphorus: 2.6 mg/dL (ref 2.5–4.6)

## 2022-02-25 LAB — ETHANOL: Alcohol, Ethyl (B): <10 mg/dL (ref <10)

## 2022-02-25 LAB — FOLATE: Folate: 5.5 ng/mL — ABNORMAL LOW (ref >5.9)

## 2022-02-25 LAB — I-STAT BETA HCG BLOOD, ED (MC, WL, AP ONLY): I-stat hCG, quantitative: <5 m[IU]/mL (ref <5)

## 2022-02-25 LAB — RAPID URINE DRUG SCREEN, HOSP PERFORMED
Amphetamines: NOT DETECTED
Barbiturates: NOT DETECTED
Benzodiazepines: POSITIVE — AB
Cocaine: NOT DETECTED
Opiates: NOT DETECTED
Tetrahydrocannabinol: POSITIVE — AB

## 2022-02-25 LAB — MAGNESIUM: Magnesium: 1.9 mg/dL (ref 1.7–2.4)

## 2022-02-25 MED ORDER — ADULT MULTIVITAMIN W/MINERALS CH
1.0000 | ORAL_TABLET | Freq: Every day | ORAL | Status: DC
  Administered 2022-02-25 – 2022-02-27 (×3): 1 via ORAL
  Filled 2022-02-25 (×3): qty 1

## 2022-02-25 MED ORDER — ENOXAPARIN SODIUM 40 MG/0.4ML IJ SOSY
40.0000 mg | PREFILLED_SYRINGE | INTRAMUSCULAR | Status: DC
Start: 2022-02-25 — End: 2022-02-27
  Filled 2022-02-25: qty 0.4

## 2022-02-25 MED ORDER — DIAZEPAM 5 MG/ML IJ SOLN
5.0000 mg | Freq: Once | INTRAMUSCULAR | Status: AC
Start: 2022-02-25 — End: 2022-02-25
  Administered 2022-02-25: 5 mg via INTRAVENOUS
  Filled 2022-02-25: qty 2

## 2022-02-25 MED ORDER — THIAMINE HCL 100 MG/ML IJ SOLN
100.0000 mg | Freq: Once | INTRAMUSCULAR | Status: AC
  Administered 2022-02-25: 100 mg via INTRAVENOUS
  Filled 2022-02-25: qty 2

## 2022-02-25 MED ORDER — LORAZEPAM 1 MG PO TABS
1.0000 mg | ORAL_TABLET | ORAL | Status: DC | PRN
  Administered 2022-02-25 – 2022-02-27 (×3): 1 mg via ORAL
  Filled 2022-02-25 (×3): qty 1

## 2022-02-25 MED ORDER — SODIUM CHLORIDE 0.9 % IV SOLN: INTRAVENOUS | Status: DC

## 2022-02-25 MED ORDER — ALPRAZOLAM 0.25 MG PO TABS
0.2500 mg | ORAL_TABLET | Freq: Three times a day (TID) | ORAL | Status: DC | PRN
  Administered 2022-02-25 – 2022-02-26 (×4): 0.25 mg via ORAL
  Filled 2022-02-25 (×4): qty 1

## 2022-02-25 MED ORDER — MORPHINE SULFATE (PF) 2 MG/ML IV SOLN
2.0000 mg | INTRAVENOUS | Status: DC | PRN
  Administered 2022-02-25 – 2022-02-26 (×4): 2 mg via INTRAVENOUS
  Filled 2022-02-25 (×4): qty 1

## 2022-02-25 MED ORDER — THIAMINE MONONITRATE 100 MG PO TABS
100.0000 mg | ORAL_TABLET | Freq: Every day | ORAL | Status: DC
Start: 2022-02-25 — End: 2022-02-26
  Filled 2022-02-25 (×2): qty 1

## 2022-02-25 MED ORDER — LORAZEPAM 2 MG/ML IJ SOLN
1.0000 mg | INTRAMUSCULAR | Status: DC | PRN
  Administered 2022-02-25 – 2022-02-26 (×2): 2 mg via INTRAVENOUS
  Administered 2022-02-27: 1 mg via INTRAVENOUS
  Filled 2022-02-25 (×3): qty 1

## 2022-02-25 MED ORDER — FOLIC ACID 1 MG PO TABS
1.0000 mg | ORAL_TABLET | Freq: Every day | ORAL | Status: DC
  Administered 2022-02-25 – 2022-02-27 (×3): 1 mg via ORAL
  Filled 2022-02-25 (×3): qty 1
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
## 2022-02-25 NOTE — Discharge Instructions (Signed)
Please have the Pt reach out to these resources.

## 2022-02-25 NOTE — Progress Notes (Signed)
EEG complete - results pending 

## 2022-02-25 NOTE — Procedures (Signed)
Patient Name: Angela Christian  MRN: [id number]  Epilepsy Attending: Charlsie Quest  Referring Physician/Provider: Burnadette Pop, MD  Date: 02/25/2022 Duration: 22.06 mins  Patient history: 26yo F with seizure in setting of alcohol withdrawal. EEG to evaluate for seizure.   Level of alertness: Awake  AEDs during EEG study: Ativan  Technical aspects: This EEG study was done with scalp electrodes positioned according to the 10-20 International system of electrode placement. Electrical activity was reviewed with band pass filter of 1-70Hz , sensitivity of 7 uV/mm, display speed of 16mm/sec with a 60Hz  notched filter applied as appropriate. EEG data were recorded continuously and digitally stored.  Video monitoring was available and reviewed as appropriate.  Description: The posterior dominant rhythm consists of 8-9 Hz activity of moderate voltage (25-35 uV) seen predominantly in posterior head regions, symmetric and reactive to eye opening and eye closing. There is an excessive amount of 15 to 18 Hz, 2-3 uV beta activity distributed symmetrically and diffusely.  Hyperventilation and photic stimulation were not performed.     ABNORMALITY - Excessive beta, generalized  IMPRESSION: This study is within normal limits. The excessive beta activity seen in the background is most likely due to the effect of benzodiazepine and is a benign EEG pattern. No seizures or epileptiform discharges were seen throughout the recording.   Welles Walthall 

## 2022-02-25 NOTE — ED Provider Notes (Signed)
[facility]-EMERGENCY DEPT Provider Note   CSN: [id number] Arrival date & time: 02/25/22  1039     History  Chief Complaint  Patient presents with   Altered Mental Status   Seizures    Angela Christian is a 26 y.o. female.  Patient is a 26 year old female with a past medical history of alcohol use disorder and depression presenting to the emergency department with a seizure versus syncopal episode.  Per medics, they received the report the patient was walking home from a friend's house this morning.  Bystanders called 911 when she was found in the road.  Medics state that when they arrived she appeared confused and postictal and had some abrasion on her hands and knees.  He states she was initially oriented to self and city only.  The patient reports that she remembers walking home this morning next thing she knew she woke up in the ambulance.  She states that she was otherwise feeling well this morning and denies any recent nausea, vomiting, chest pain or shortness of breath.  She denies any lower extremity swelling, fevers or chills.  She states that her last drink of alcohol was 2 days ago but denies any prior history of alcohol withdrawal or alcohol withdrawal seizures.  She was previously on naltrexone for alcohol use disorder but states that she ran out of her medications approximately 1 month ago and is in between insurance and was unable to get a refill.  The patient states that she has had some hematuria but denies any dysuria.  The patient is additionally requesting STD testing but denies any abnormal vaginal discharge or abdominal pain.  She states that she does believe that she bit her lip but denies any other injuries.  She is unsure when her tetanus was last updated.  Patient's mother is additionally at bedside and states that she has been concerned about her daughter's depression.  She states that she has had severe depression since that she recently had a  miscarriage.  She states that she has expressed suicidal thoughts but has not expressed any plan to her.  Mom - Tereso Newcomer [phone]   Altered Mental Status Associated symptoms: seizures   Seizures      Home Medications Prior to Admission medications   Medication Sig Start Date End Date Taking? Authorizing Provider  diazepam (VALIUM) 10 MG tablet Take 10 mg by mouth daily as needed for anxiety. 09/15/21  Yes [provider]  FLUoxetine (PROZAC) 10 MG capsule Take 10 mg by mouth daily. 12/20/21  Yes [provider]  naltrexone (DEPADE) 50 MG tablet Take 50 mg by mouth daily. 10/16/21  Yes [provider]  Naphazoline HCl (CLEAR EYES OP) Place 1-2 drops into both eyes daily as needed (dryness).   Yes [provider]  pantoprazole (PROTONIX) 40 MG tablet Take 40 mg by mouth as needed (acid reflux/ vomiting). 10/07/21  Yes [provider]  REXULTI 0.5 MG TABS Take 0.5 mg by mouth daily. 10/05/21  Yes [provider]  acetaminophen (TYLENOL) 500 MG tablet Take 2 tablets (1,000 mg total) by mouth every 6 (six) hours. Patient not taking: Reported on 02/25/2022 12/27/20   Roma Schanz, CNM  albuterol (VENTOLIN HFA) 108 (90 Base) MCG/ACT inhaler Inhale 2 puffs into the lungs every 6 (six) hours as needed for wheezing or shortness of breath. Patient not taking: Reported on 02/25/2022 06/26/20   Hall-Potvin, [place], PA-C  ARIPiprazole (ABILIFY) 5 MG tablet Take 1  tablet (5 mg total) by mouth at bedtime. Patient not taking: Reported on 02/25/2022 12/27/20 04/26/21  Roma Schanz, CNM  ibuprofen (ADVIL) 600 MG tablet Take 1 tablet (600 mg total) by mouth every 6 (six) hours. Patient not taking: Reported on 02/25/2022 12/27/20   Rhea Pink B, CNM  ondansetron (ZOFRAN-ODT) 4 MG disintegrating tablet Take 4 mg by mouth 4 (four) times daily as needed. Patient not taking: Reported on 02/25/2022 10/07/21   [provider]  Prenatal Vit-Fe  Fumarate-FA (PRENATAL MULTIVITAMIN) TABS tablet Take 1 tablet by mouth daily at 12 noon. Patient not taking: Reported on 10/27/2021    [provider]  sertraline (ZOLOFT) 50 MG tablet Take 1 tablet (50 mg total) by mouth daily. Patient not taking: Reported on 02/25/2022 12/28/20 04/27/21  Roma Schanz, CNM  omeprazole (PRILOSEC) 20 MG capsule Take 1 capsule (20 mg total) by mouth daily. 11/20/19 12/11/19  Darr, Gerilyn Pilgrim, PA-C      Allergies    Patient has no known allergies.    Review of Systems   Review of Systems  Neurological:  Positive for seizures.    Physical Exam Updated Vital Signs BP 122/86   Pulse 97   Temp (!) 97.4 F (36.3 C) (Oral)   Resp (!) 23   Ht 5' 4.5" (1.638 m)   SpO2 100%   BMI 16.33 kg/m  Physical Exam Vitals and nursing note reviewed.  Constitutional:      General: She is not in acute distress.    Appearance: Normal appearance.  HENT:     Head: Normocephalic and atraumatic.     Nose: Nose normal.     Mouth/Throat:     Mouth: Mucous membranes are moist.     Comments: Lower lip bite, non-bleeding, non-gaping. No tongue bite Eyes:     Extraocular Movements: Extraocular movements intact.     Conjunctiva/sclera: Conjunctivae normal.     Pupils: Pupils are equal, round, and reactive to light.  Cardiovascular:     Rate and Rhythm: Normal rate and regular rhythm.     Pulses: Normal pulses.     Heart sounds: Normal heart sounds.  Pulmonary:     Effort: Pulmonary effort is normal.     Breath sounds: Normal breath sounds.  Abdominal:     General: Abdomen is flat.     Palpations: Abdomen is soft.     Tenderness: There is no abdominal tenderness.  Musculoskeletal:        General: No swelling or deformity. Normal range of motion.     Cervical back: Normal range of motion and neck supple.     Comments: Superficial abrasions to bilateral knees, nonbleeding Bilateral arms tremulous at rest  Skin:    General: Skin is warm and dry.     Comments:  Superficial abrasions to bilateral hands, non-bleeding  Neurological:     General: No focal deficit present.     Mental Status: She is alert and oriented to person, place, and time.     Sensory: No sensory deficit.     Motor: No weakness.  Psychiatric:        Mood and Affect: Mood normal.        Behavior: Behavior normal.     ED Results / Procedures / Treatments   Labs (all labs ordered are listed, but only abnormal results are displayed) Labs Reviewed  COMPREHENSIVE METABOLIC PANEL - Abnormal; Notable for the following components:      Result Value   Glucose, Bld  120 (*)    AST 57 (*)    Total Bilirubin 1.6 (*)    All other components within normal limits  CBC WITH DIFFERENTIAL/PLATELET - Abnormal; Notable for the following components:   MCV 102.7 (*)    MCH 34.1 (*)    Platelets 138 (*)    Neutro Abs 9.2 (*)    Lymphs Abs 0.5 (*)    All other components within normal limits  URINALYSIS, ROUTINE W REFLEX MICROSCOPIC - Abnormal; Notable for the following components:   APPearance HAZY (*)    Glucose, UA >=500 (*)    Ketones, ur 20 (*)    Protein, ur >=300 (*)    Bacteria, UA RARE (*)    All other components within normal limits  RAPID URINE DRUG SCREEN, HOSP PERFORMED - Abnormal; Notable for the following components:   Benzodiazepines POSITIVE (*)    Tetrahydrocannabinol POSITIVE (*)    All other components within normal limits  CBG MONITORING, ED - Abnormal; Notable for the following components:   Glucose-Capillary 181 (*)    All other components within normal limits  PHOSPHORUS  MAGNESIUM  ETHANOL  RAPID HIV SCREEN (HIV 1/2 AB+AG)  RPR  FOLATE  I-STAT BETA HCG BLOOD, ED ([facility], [facility], [facility] ONLY)  GC/CHLAMYDIA PROBE AMP ([place]) NOT AT [facility]    EKG EKG Interpretation  Date/Time:  Thursday February 25 2022 11:04:56 EDT Ventricular Rate:  93 PR Interval:    QRS Duration: 128 QT Interval:  413 QTC Calculation: 517 R Axis:   88 Text Interpretation: Sinus rhythm  Right bundle branch block LVH with secondary repolarization abnormality Prolonged QT interval Significant baseline Artifact in lead(s) I II III aVR aVL aVF V1 V2 V3 V6 Significant baseline artifact, but no significant change from prior eKG Confirmed by Arturo Morton ([id number]) on 02/25/2022 1:12:42 PM  Radiology CT Head Wo Contrast  Result Date: 02/25/2022 CLINICAL DATA:  Seizure EXAM: CT HEAD WITHOUT CONTRAST TECHNIQUE: Contiguous axial images were obtained from the base of the skull through the vertex without intravenous contrast. RADIATION DOSE REDUCTION: This exam was performed according to the departmental dose-optimization program which includes automated exposure control, adjustment of the mA and/or kV according to patient size and/or use of iterative reconstruction technique. COMPARISON:  MRI head 06/09/2013 FINDINGS: Brain: No evidence of acute infarction, hemorrhage, hydrocephalus, extra-axial collection or mass lesion/mass effect. Vascular: Negative for hyperdense vessel Skull: Negative Sinuses/Orbits: Negative Other: None IMPRESSION: Negative CT head Electronically Signed   By: Marlan Palau M.D.   On: 02/25/2022 11:45    Procedures Procedures    Medications Ordered in ED Medications  LORazepam (ATIVAN) tablet 1-4 mg (1 mg Oral Given 02/25/22 1312)    Or  LORazepam (ATIVAN) injection 1-4 mg ( Intravenous See Alternative 02/25/22 1312)  folic acid (FOLVITE) tablet 1 mg (1 mg Oral Given 02/25/22 1302)  multivitamin with minerals tablet 1 tablet (1 tablet Oral Given 02/25/22 1302)  thiamine (VITAMIN B1) tablet 100 mg (has no administration in time range)  enoxaparin (LOVENOX) injection 40 mg (has no administration in time range)  0.9 %  sodium chloride infusion (has no administration in time range)  ALPRAZolam (XANAX) tablet 0.25 mg (has no administration in time range)  thiamine (VITAMIN B1) injection 100 mg (100 mg Intravenous Given 02/25/22 1142)  diazepam (VALIUM) injection 5 mg (5  mg Intravenous Given 02/25/22 1143)    ED Course/ Medical Decision Making/ A&P Clinical Course as of 02/25/22 1437  Thu Feb 25, 2022  1316  Upon reassessment, the patient has had some improvement of her tremulousness and is now tremulous with arm extension no longer at rest.  She is mildly tachycardic, still symptomatic of alcohol withdrawal.  She will be given Ativan per the CIWA protocol.  Remainder of her labs reviewed and interpreted by myself within normal range without other cause of seizure is likely due to her alcohol withdrawal.  CT head is negative for acute injury.  The patient will be admitted for her alcohol withdrawal.  Also discussed the patient's depression with her.  She denies any SI or HI but states that she would be open to seeing a psychiatrist and being restarted on her medication for depression. [VK]    Clinical Course User Index [VK] Phoebe Sharps, DO                           Medical Decision Making Patient is a 26 year old female with a past medical history of alcohol use disorder and depression presenting to the emergency department with a seizure versus syncopal episode.  Patient is back to her neurologic baseline and due to episode of confusion for medics that is improving, concern for especially in the setting of lip bite.  With patient's history of alcohol use disorder, concern for alcohol withdrawal seizure and she will be given a dose of Valium.  She is tremulous on exam.  She will additionally have labs, head CT performed to evaluate for electrolyte abnormality, pregnancy, ICH or mass effect and other causes of seizure.  EKG will be performed to evaluate for arrhythmia and possible setting of syncope.  Amount and/or Complexity of Data Reviewed External Data Reviewed: notes.    Details: Last TDAP given in 2021 Labs: ordered. Radiology: ordered.  Risk Prescription drug management. Decision regarding hospitalization.           Final Clinical  Impression(s) / ED Diagnoses Final diagnoses:  Alcohol withdrawal seizure without complication [facility])    Rx / DC Orders ED Discharge Orders     None         Phoebe Sharps, DO 02/25/22 1437

## 2022-02-26 ENCOUNTER — Observation Stay (HOSPITAL_COMMUNITY): Payer: Self-pay

## 2022-02-26 LAB — BASIC METABOLIC PANEL WITH GFR
Anion gap: 5 (ref 5–15)
BUN: 7 mg/dL (ref 6–20)
CO2: 26 mmol/L (ref 22–32)
Calcium: 8.8 mg/dL — ABNORMAL LOW (ref 8.9–10.3)
Chloride: 110 mmol/L (ref 98–111)
Creatinine, Ser: 0.78 mg/dL (ref 0.44–1.00)
GFR, Estimated: >60 mL/min
Glucose, Bld: 86 mg/dL (ref 70–99)
Potassium: 3.6 mmol/L (ref 3.5–5.1)
Sodium: 141 mmol/L (ref 135–145)

## 2022-02-26 LAB — RPR: RPR Ser Ql: NONREACTIVE

## 2022-02-26 LAB — CBC
HCT: 34.7 % — ABNORMAL LOW (ref 36.0–46.0)
Hemoglobin: 11.4 g/dL — ABNORMAL LOW (ref 12.0–15.0)
MCH: 34.5 pg — ABNORMAL HIGH (ref 26.0–34.0)
MCHC: 32.9 g/dL (ref 30.0–36.0)
MCV: 105.2 fL — ABNORMAL HIGH (ref 80.0–100.0)
Platelets: 116 10*3/uL — ABNORMAL LOW (ref 150–400)
RBC: 3.3 MIL/uL — ABNORMAL LOW (ref 3.87–5.11)
RDW: 14.9 % (ref 11.5–15.5)
WBC: 5.5 10*3/uL (ref 4.0–10.5)
nRBC: 0 % (ref 0.0–0.2)

## 2022-02-26 LAB — GC/CHLAMYDIA PROBE AMP (~~LOC~~) NOT AT ARMC
Chlamydia: NEGATIVE
Comment: NEGATIVE
Comment: NORMAL
Neisseria Gonorrhea: POSITIVE — AB

## 2022-02-26 MED ORDER — NICOTINE 14 MG/24HR TD PT24
14.0000 mg | MEDICATED_PATCH | Freq: Every day | TRANSDERMAL | Status: DC
  Administered 2022-02-26: 14 mg via TRANSDERMAL
  Filled 2022-02-26 (×4): qty 1

## 2022-02-26 MED ORDER — VITAMIN B-1 100 MG PO TABS: 100.0000 mg | ORAL_TABLET | Freq: Every day | ORAL | 1 refills | Status: DC

## 2022-02-26 MED ORDER — SODIUM CHLORIDE 0.9 % IV SOLN
1.0000 g | INTRAVENOUS | Status: DC
  Administered 2022-02-26: 1 g via INTRAVENOUS
  Filled 2022-02-26: qty 10

## 2022-02-26 MED ORDER — ACETAMINOPHEN 325 MG PO TABS
650.0000 mg | ORAL_TABLET | Freq: Four times a day (QID) | ORAL | Status: DC | PRN
  Administered 2022-02-26 – 2022-02-27 (×3): 650 mg via ORAL
  Filled 2022-02-26 (×3): qty 2

## 2022-02-26 MED ORDER — IBUPROFEN 200 MG PO TABS
600.0000 mg | ORAL_TABLET | Freq: Four times a day (QID) | ORAL | Status: DC | PRN
  Administered 2022-02-26: 600 mg via ORAL
  Filled 2022-02-26: qty 3

## 2022-02-26 MED ORDER — FOLIC ACID 1 MG PO TABS: 1.0000 mg | ORAL_TABLET | Freq: Every day | ORAL | 1 refills | Status: DC

## 2022-02-26 MED ORDER — ENSURE ENLIVE PO LIQD
237.0000 mL | Freq: Three times a day (TID) | ORAL | Status: DC
  Administered 2022-02-26: 237 mL via ORAL

## 2022-02-26 MED ORDER — SODIUM CHLORIDE 0.9 % IV SOLN
1.0000 mg | Freq: Once | INTRAVENOUS | Status: AC
  Administered 2022-02-26: 1 mg via INTRAVENOUS
  Filled 2022-02-26: qty 0.2

## 2022-02-26 MED ORDER — THIAMINE MONONITRATE 100 MG PO TABS
100.0000 mg | ORAL_TABLET | Freq: Every day | ORAL | Status: DC
  Administered 2022-02-27: 100 mg via ORAL
  Filled 2022-02-26 (×2): qty 1

## 2022-02-26 NOTE — Plan of Care (Signed)
°  Problem: Coping: °Goal: Level of anxiety will decrease °Outcome: Progressing °  °

## 2022-02-26 NOTE — Progress Notes (Signed)
Pt c/o left knee pain stated pain level remains at 6/10 applies ice says it helps. Limping when stood up to walk to sink this a.m was not limping last night.

## 2022-02-26 NOTE — Progress Notes (Signed)
PROGRESS NOTE  Angela Christian  GEX:[id number] DOB: October 10, 1995 DOA: 02/25/2022 PCP: [facility]   Brief Narrative: Angela Christian is a 26 y.o. female with medical history significant of alcohol use disorder, depression who was brought to the emergency department from the street after she was found on the road.  Bystanders called 911 and she was brought by EMS to the emergency department.  Patient was confused and postictal on presentation but her mental status gradually improved.  She was admitted for the concern of alcohol withdrawal seizure.  Assessment & Plan:  Principal Problem:   Seizure due to alcohol withdrawal (HCC) Active Problems:   Major depressive disorder, recurrent episode (HCC)   Schizoaffective disorder (HCC)   H/O anxiety disorder   Alcohol withdrawal syndrome (HCC)   Substance abuse (HCC)   Thrombocytopenia (HCC)   Alcohol withdrawal (HCC)  Alcohol withdrawal seizure: Patient was found to be postictal, confused when she was found on the street.  This could be alcohol withdrawal seizure or could be intoxication due to substance abuse.  Her UDS was positive for THC, benzodiazepines. No history of seizure disorder.  EEG negative for seizure, epileptiform focus..  No indication to start on antiepileptic  drug for now.  Monitor mental status.  CT head did not show any acute iintracranial abnormalities. Currently she is fully alert and oriented.   Alcohol withdrawal syndrome: History of chronic alcohol abuse.  Continue thiamine and folic acid.Started on alcohol withdrawal protocol, CIWA scoring.  Counseled for cessation.  Patient was previously taking naltrexone.  We requested  TOC consultation for rehabilitation resources   Substance abuse: UDS positive for THC, benzodiazepines.  Counseled for cessation   History of anxiety disorder: On Valium as needed at home.   History of seizure affective disorder/depression: Previously on Zoloft, Abilify.  Currently  not taking any medications because her insurance was taken off.  We recommend to follow-up with psychiatry as an outpatient.   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   Liver enzymes are normal.  Right upper quadrant ultrasound normal.   Macrocytosis: Most likely secondary to alcohol use.  Low glucose level.  Supplemented with IV folic acid.  Continue folic acid orally.  Concern for STD: Her partner was recently diagnosed with gonorrhea.  Patient requested to be tested.  Gonorrhea/chlamydia vaginal probe pending.  Left knee pain: Most likely secondary to fall.  X-ray did not show any fracture or dislocation.   Nutrition Problem: Inadequate oral intake Etiology: poor appetite    DVT prophylaxis:enoxaparin (LOVENOX) injection 40 mg Start: 02/25/22 2200     Code Status: Full Code  Family Communication: None at bedside  Patient status:Inpatient  Patient is from :Home  Anticipated discharge WN:[id number]  Estimated DC date:tomorrow   Consultants: None  Procedures: None  Antimicrobials:  Anti-infectives (From admission, onward)    None       Subjective:  Patient seen and examined at the bedside this morning.  When I examined her, she was drowsy, sleepy.  On waking her up, she was alert and oriented.  She complains of left knee pain.  She does not feel ready to go home today and wants to follow the test that was done to rule out sexually transmitted disease.  Objective: Vitals:   02/25/22 2037 02/26/22 0059 02/26/22 0431 02/26/22 1231  BP: 133/83 117/73 122/66 113/76  Pulse: 90 73 (!) 58 67  Resp: 20 16 16 16   Temp: 98.8 F (37.1 C)  98.2 F (36.8 C)  99.1 F (37.3 C)  TempSrc: Oral   Oral  SpO2: 100% 99% 100% 100%  Height:        Intake/Output Summary (Last 24 hours) at 02/26/2022 1250 Last data filed at 02/26/2022 0841 Gross per 24 hour  Intake 1745.1 ml  Output 200 ml  Net 1545.1 ml   There were no vitals filed  for this visit.  Examination:  General exam: Overall comfortable, not in distress, drowsy/sleepy HEENT: PERRL Respiratory system:  no wheezes or crackles  Cardiovascular system: S1 & S2 heard, RRR.  Gastrointestinal system: Abdomen is nondistended, soft and nontender. Central nervous system: Alert and oriented Extremities: No edema, no clubbing ,no cyanosis, tenderness on the left knee Skin: No rashes, no ulcers,no icterus     Data Reviewed: I have personally reviewed following labs and imaging studies  CBC: Recent Labs  Lab 02/25/22 1109 02/26/22 0608  WBC 10.1 5.5  NEUTROABS 9.2*  --   HGB 14.0 11.4*  HCT 42.1 34.7*  MCV 102.7* 105.2*  PLT 138* 116*   Basic Metabolic Panel: Recent Labs  Lab 02/25/22 1109 02/26/22 0608  NA 141 141  K 3.9 3.6  CL 105 110  CO2 25 26  GLUCOSE 120* 86  BUN 8 7  CREATININE 0.93 0.78  CALCIUM 9.8 8.8*  MG 1.9  --   PHOS 2.6  --      No results found for this or any previous visit (from the past 240 hour(s)).   Radiology Studies: DG Knee 4 Views W/Patella Left  Result Date: 02/26/2022 CLINICAL DATA:  Knee pain EXAM: LEFT KNEE - COMPLETE 4+ VIEW COMPARISON:  None Available. FINDINGS: No evidence of fracture, dislocation, or joint effusion. No evidence of arthropathy or other focal bone abnormality. Soft tissues are unremarkable. IMPRESSION: No acute abnormality Electronically Signed   By: Judie Petit.  Shick M.D.   On: 02/26/2022 10:19   EEG adult  Result Date: 02/25/2022 Charlsie Quest, MD     02/25/2022  6:59 PM Patient Name: Angela Christian MRN: [id number] Epilepsy Attending: Charlsie Quest Referring Physician/Provider: Burnadette Pop, MD Date: 02/25/2022 Duration: 22.06 mins Patient history: 26yo F with seizure in setting of alcohol withdrawal. EEG to evaluate for seizure. Level of alertness: Awake AEDs during EEG study: Ativan Technical aspects: This EEG study was done with scalp electrodes positioned according to the 10-20  International system of electrode placement. Electrical activity was reviewed with band pass filter of 1-70Hz , sensitivity of 7 uV/mm, display speed of 23mm/sec with a 60Hz  notched filter applied as appropriate. EEG data were recorded continuously and digitally stored.  Video monitoring was available and reviewed as appropriate. Description: The posterior dominant rhythm consists of 8-9 Hz activity of moderate voltage (25-35 uV) seen predominantly in posterior head regions, symmetric and reactive to eye opening and eye closing. There is an excessive amount of 15 to 18 Hz, 2-3 uV beta activity distributed symmetrically and diffusely.  Hyperventilation and photic stimulation were not performed.   ABNORMALITY - Excessive beta, generalized IMPRESSION: This study is within normal limits. The excessive beta activity seen in the background is most likely due to the effect of benzodiazepine and is a benign EEG pattern. No seizures or epileptiform discharges were seen throughout the recording. Priyanka   Annabelle Harman Abdomen Limited RUQ (LIVER/GB)  Result Date: 02/25/2022 CLINICAL DATA:  Thrombocytopenia. EXAM: ULTRASOUND ABDOMEN LIMITED RIGHT UPPER QUADRANT COMPARISON:  None Available. FINDINGS: Gallbladder: No gallstones or wall thickening visualized.  No sonographic Murphy sign noted by sonographer. Common bile duct: Diameter: 2.0 mm, within normal limits Liver: No focal lesion identified. Within normal limits in parenchymal echogenicity. Portal vein is patent on color Doppler imaging with normal direction of blood flow towards the liver. Other: None. IMPRESSION: Negative right upper quadrant ultrasound. Electronically Signed   By: Marin Roberts M.D.   On: 02/25/2022 14:46   CT Head Wo Contrast  Result Date: 02/25/2022 CLINICAL DATA:  Seizure EXAM: CT HEAD WITHOUT CONTRAST TECHNIQUE: Contiguous axial images were obtained from the base of the skull through the vertex without intravenous contrast. RADIATION DOSE  REDUCTION: This exam was performed according to the departmental dose-optimization program which includes automated exposure control, adjustment of the mA and/or kV according to patient size and/or use of iterative reconstruction technique. COMPARISON:  MRI head 06/09/2013 FINDINGS: Brain: No evidence of acute infarction, hemorrhage, hydrocephalus, extra-axial collection or mass lesion/mass effect. Vascular: Negative for hyperdense vessel Skull: Negative Sinuses/Orbits: Negative Other: None IMPRESSION: Negative CT head Electronically Signed   By: Marlan Palau M.D.   On: 02/25/2022 11:45    Scheduled Meds:  enoxaparin (LOVENOX) injection  40 mg Subcutaneous Q24H   feeding supplement  237 mL Oral TID BM   folic acid  1 mg Oral Daily   multivitamin with minerals  1 tablet Oral Daily   [START ON 02/27/2022] thiamine  100 mg Oral Daily   Continuous Infusions:  sodium chloride 125 mL/hr at 02/26/22 0206     LOS: 0 days   Burnadette Pop, MD [facility] P8/05/2022, 12:50 PM

## 2022-02-26 NOTE — Progress Notes (Signed)
Patient and mother both sleeping at this time, patient appears comfortable, no signs/symptoms of withdrawal at this time, call bell in reach, will continue to monitor.

## 2022-02-26 NOTE — TOC Initial Note (Signed)
Transition of Care [facility]) - Initial/Assessment Note   Patient Details  Name: Angela Christian MRN: [id number] Date of Birth: Dec 14, 1995  Transition of Care [facility]) CM/SW Contact:    Ewing Schlein, LCSW Phone Number: 02/26/2022, 11:36 AM  Clinical Narrative: [facility] consulted for substance use resources and medication assistance. CSW spoke with patient regarding consults. Patient is agreeable to having substance use resources added to AVS.  Patient also confirmed she has difficulty paying for monthly medications as she is uninsured. Patient is able to afford a $3.00 copay per medication. MATCH voucher provided to patient and is accepted at her [facility] pharmacy. CSW updated hospitalist.  Expected Discharge Plan: Home/Self Care Barriers to Discharge: Inadequate or no insurance, Continued Medical Work up  Patient Goals and CMS Choice Choice offered to / list presented to : NA  Expected Discharge Plan and Services Expected Discharge Plan: Home/Self Care In-house Referral: Clinical Social Work Post Acute Care Choice: NA Living arrangements for the past 2 months: Apartment            DME Arranged: N/A DME Agency: NA  Prior Living Arrangements/Services Living arrangements for the past 2 months: Apartment Patient language and need for interpreter reviewed:: Yes Do you feel safe going back to the place where you live?: Yes      Need for Family Participation in Patient Care: No (Comment) Care giver support system in place?: Yes (comment) Criminal Activity/Legal Involvement Pertinent to Current Situation/Hospitalization: No - Comment as needed  Activities of Daily Living Home Assistive Devices/Equipment: None ADL Screening (condition at time of admission) Patient's cognitive ability adequate to safely complete daily activities?: Yes Is the patient deaf or have difficulty hearing?: No Does the patient have difficulty seeing, even when wearing glasses/contacts?: No Does the patient have  difficulty concentrating, remembering, or making decisions?: No (some hallucinating the other night per pt) Patient able to express need for assistance with ADLs?: Yes Does the patient have difficulty dressing or bathing?: No Independently performs ADLs?: Yes (appropriate for developmental age) Does the patient have difficulty walking or climbing stairs?: No Weakness of Legs: Both Weakness of Arms/Hands: Both  Emotional Assessment Attitude/Demeanor/Rapport: Engaged Affect (typically observed): Accepting Alcohol / Substance Use: Alcohol Use  Admission diagnosis:  Alcohol withdrawal (HCC) [F10.939] Alcohol withdrawal seizure without complication (HCC) [F10.930, R56.9] Patient Active Problem List   Diagnosis Date Noted   Alcohol withdrawal syndrome (HCC) 02/25/2022   Seizure due to alcohol withdrawal (HCC) 02/25/2022   Substance abuse (HCC) 02/25/2022   Thrombocytopenia (HCC) 02/25/2022   Alcohol withdrawal (HCC) 02/25/2022   Subclinical hyperthyroidism 10/27/2021   Schizoaffective disorder (HCC) 12/26/2020   Cesarean delivery, delivered, current hospitalization 12/26/2020   Encounter for induction of labor 12/26/2020   H/O anxiety disorder 12/26/2020   Acute blood loss anemia 12/26/2020   IDA (iron deficiency anemia) 12/25/2020   IUGR (intrauterine growth restriction) affecting care of mother 12/24/2020   Cannabis use without complication 12/24/2020   Postpartum care following cesarean delivery 12/24/2020   Major depressive disorder, recurrent episode (HCC) 09/10/2015   PCP:  [facility]:   [id number] [place], [place] - [address] AT [facility] [address] [facility] [address] [place] [address] Phone: [phone] Fax: [phone]  Readmission Risk Interventions     No data to display

## 2022-02-26 NOTE — Progress Notes (Signed)
Initial Nutrition Assessment  DOCUMENTATION CODES:   Underweight  INTERVENTION:   -Obtain new wt -Ensure Enlive po TID, each supplement provides 350 kcal and 20 grams of protein -MVI with minerals daily  NUTRITION DIAGNOSIS:   Inadequate oral intake related to poor appetite as evidenced by per patient/family report.  GOAL:   Patient will meet greater than or equal to 90% of their needs  MONITOR:   PO intake, Supplement acceptance  REASON FOR ASSESSMENT:   Malnutrition Screening Tool    ASSESSMENT:   Pt with medical history significant of alcohol use disorder, depression presents from the street after she was found on the road.  Pt admitted with alcohol withdrawal seizures.   8/10- s/p EEG- WDL  Reviewed I/O's: +1.6 L x 24 hours  UOP: 200 ml x 24 hours  Per H&P, pt's last drinks was 3 days PTA. Pt's ETOH use increases when she is under stress.   Spoke with pt over the phone, who reports feeling a little better this morning, but complains of feeling very sore this morning. Pt reports she has a fair appetite and consumed a couple of pieces of bacon this morning. PTA pt reports that she does not follow a structured meal pattern but snacks on foods (such as bacon) that she knows that she can tolerate. Unable to get a detailed diet recall despite probing.   Pt unsure of what she weighs now, but suspects she has lost weight. She is unsure how much. Pt reports her UBW is around 114-125#, which she last weighed after she had her son, who is now 28 year old. Noted no new weight obtained since 10/2021; will obtain more recent wt to assess for weight changes. Noted history of weight loss.   Discussed importance of good meal and supplement intake to promote healing. Pt amenable to Ensure supplements. Given pt's history of weight loss, ETOH use, and underweight status, highly suspect pt with malnutrition, however, unable to identify at this time.   Medications reviewed and include  folic acid, thiamine, and 0.9% sodium chloride infusion @ 125 ml/hr.   Labs reviewed: CBGS: 181 (inpatient orders for glycemic control are none).    Diet Order:   Diet Order             Diet regular Room service appropriate? Yes; Fluid consistency: Thin  Diet effective now                   EDUCATION NEEDS:   Education needs have been addressed  Skin:  Skin Assessment: Reviewed RN Assessment  Last BM:  Unknown  Height:   Ht Readings from Last 1 Encounters:  02/25/22 5' 4.5" (1.638 m)    Weight:   Wt Readings from Last 1 Encounters:  10/27/21 43.8 kg    Ideal Body Weight:  55.7 kg  BMI:  Body mass index is 16.33 kg/m.  Estimated Nutritional Needs:   Kcal:  1750-1950  Protein:  90-105 grams  Fluid:  > 1.7 L    Levada Schilling, RD, LDN, CDCES Registered Dietitian II Certified Diabetes Care and Education Specialist Please refer to [facility] for RD and/or RD on-call/weekend/after hours pager

## 2022-02-27 MED ORDER — FLUOXETINE HCL 10 MG PO CAPS: 10.0000 mg | ORAL_CAPSULE | Freq: Every day | ORAL | 0 refills | Status: DC

## 2022-02-27 MED ORDER — REXULTI 0.5 MG PO TABS: 0.5000 mg | ORAL_TABLET | Freq: Every day | ORAL | 0 refills | Status: DC

## 2022-02-27 MED ORDER — DIAZEPAM 10 MG PO TABS
10.0000 mg | ORAL_TABLET | Freq: Every day | ORAL | 0 refills | Status: DC | PRN
Start: 2022-02-27 — End: 2022-09-15

## 2022-02-27 NOTE — TOC Transition Note (Signed)
Transition of Care [facility]) - CM/SW Discharge Note   Patient Details  Name: Angela Christian MRN: [id number] Date of Birth: 05-Jun-1996  Transition [facility] Care [facility]) CM/SW Contact:  Darleene Cleaver, LCSW Phone Number: 02/27/2022, 10:58 AM   Clinical Narrative:     CSW received a message from bedside nurse that patient's mother would like to speak to CSW.  CSW called her, she was requesting information for outpatient substance abuse resources, outpatient mental health, and how to apply for different Medicaid.  CSW informed her that the information on substance abuse resources and outpatient mental health will be on the AVS once patient is ready for discharge.  CSW also suggested that she contact Department of Social Services to see if patient qualifies for other type of Medicaid.  CSW provided contact information for [facility] as well and informed her they are open 24 hours and patients can go there any time.  Patient was provided Match letter yesterday in regards to getting her medications cheaply.  CSW to continue to follow patient's progress throughout discharge planning.  CSW informed patient's mother that the resources are added to the AVS and to ask for two copies to be printed once patient is ready for discharge.  One for patient and one for her mother.  10:58am  Patient will be discharging back home today.  CSW signing off, please reconsult if other social work needs arise.   Final next level of care: Home/Self Care Barriers to Discharge: Barriers Resolved   Patient Goals and CMS Choice Patient states their goals for this hospitalization and ongoing recovery are:: To return back home CMS Medicare.gov Compare Post Acute Care list provided to:: Patient Represenative (must comment) Choice offered to / list presented to : Parent  Discharge Placement                       Discharge Plan and Services In-house Referral: Clinical Social Work   Post Acute  Care Choice: NA          DME Arranged: N/A DME Agency: NA                  Social Determinants of Health (SDOH) Interventions     Readmission Risk Interventions     No data to display

## 2022-02-27 NOTE — TOC Progression Note (Deleted)
Transition of Care [facility]) - Progression Note    Patient Details  Name: Angela Christian MRN: [id number] Date of Birth: July 13, 1996  Transition of Care [facility]) CM/SW Contact  Darleene Cleaver, [place] Phone Number: 02/27/2022, 10:11 AM  Clinical Narrative:     CSW received a message from bedside nurse that patient's mother would like to speak to CSW.  CSW called her, she was requesting information for outpatient substance abuse resources, outpatient mental health, and how to apply for different Medicaid.  CSW informed her that the information on substance abuse resources and outpatient mental health will be on the AVS once patient is ready for discharge.  CSW also suggested that she contact Department of Social Services to see if patient qualifies for other type of Medicaid.  CSW provided contact information for [facility] as well and informed her they are open 24 hours and patients can go there any time.  Patient was provided Match letter yesterday in regards to getting her medications cheaply.  CSW to continue to follow patient's progress throughout discharge planning.  CSW informed patient's mother that the resources are added to the AVS and to ask for two copies to be printed once patient is ready for discharge.  One for patient and one for her mother.   Expected Discharge Plan: Home/Self Care Barriers to Discharge: Inadequate or no insurance, Continued Medical Work up  Expected Discharge Plan and Services Expected Discharge Plan: Home/Self Care In-house Referral: Clinical Social Work   Post Acute Care Choice: NA Living arrangements for the past 2 months: Apartment                 DME Arranged: N/A DME Agency: NA                   Social Determinants of Health (SDOH) Interventions    Readmission Risk Interventions     No data to display

## 2022-02-27 NOTE — Discharge Summary (Signed)
Physician Discharge Summary  SHAR PAEZ TUU:[id number] DOB: [birth year] DOA: 02/25/2022  PCP: [facility]  Admit date: 02/25/2022 Discharge date: 02/27/2022  Admitted From: Home Disposition:  Home  Discharge Condition:Stable CODE STATUS:FULL Diet recommendation: Heart Healthy   Brief/Interim Summary:  Angela Christian is a 26 y.o. female with medical history significant of alcohol use disorder, depression who was brought to the emergency department from the street after she was found on the road.  Bystanders called 911 and she was brought by EMS to the emergency department.  Patient was confused and postictal on presentation but her mental status gradually improved.  She was admitted for the concern of alcohol withdrawal seizure.  Hospital course remained stable.  Counseled for alcohol cessation.  Medically stable for discharge today.  Following problems were addressed during her hospitalization:    Alcohol withdrawal seizure: Patient was found to be postictal, confused when she was found on the street.  This could be alcohol withdrawal seizure or could be intoxication due to substance abuse.  Her UDS was positive for THC, benzodiazepines. No history of seizure disorder.  EEG negative for seizure, epileptiform focus..  No indication to start on antiepileptic  drug for now.  Monitor mental status.  CT head did not show any acute iintracranial abnormalities. Currently she is fully alert and oriented.   Alcohol withdrawal syndrome: History of chronic alcohol abuse.  Continue thiamine and folic acid.Started on alcohol withdrawal protocol, CIWA scoring.  Counseled for cessation.  Patient was previously taking naltrexone.  We requested  TOC consultation for rehabilitation resources   Substance abuse: UDS positive for THC, benzodiazepines.  Counseled for cessation   History of anxiety disorder: On Valium as needed at home.   History of seizure affective disorder/depression:  Previously on fluoxetine, Rexulti.  Currently not taking any medications because her insurance was taken off.  We recommend to follow-up with psychiatry as an outpatient.  Prescription ordered for 1 month.   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   Liver enzymes are normal.  Right upper quadrant ultrasound normal.   Macrocytosis: Most likely secondary to alcohol use.  Low glucose level.  Supplemented with IV folic acid.  Continue folic acid orally.   Concern for STD: Her partner was recently diagnosed with gonorrhea.  Gonorrhea found to be positive.  Treated with a dose of ceftriaxone   Left knee pain: Most likely secondary to fall.  X-ray did not show any fracture or dislocation.    Discharge Diagnoses:  Principal Problem:   Seizure due to alcohol withdrawal (HCC) Active Problems:   Major depressive disorder, recurrent episode (HCC)   Schizoaffective disorder (HCC)   H/O anxiety disorder   Alcohol withdrawal syndrome (HCC)   Substance abuse (HCC)   Thrombocytopenia (HCC)   Alcohol withdrawal (HCC)    Discharge Instructions  Discharge Instructions     Diet - low sodium heart healthy   Complete by: As directed    Discharge instructions   Complete by: As directed    1)Please take prescribed medications as instructed 2)Follow up with your PCP in a week.  Follow-up with psychiatry as an outpatient 3)Please quit alcohol.  Enroll  with alcohol rehabilitation   Increase activity slowly   Complete by: As directed       Allergies as of 02/27/2022   No Known Allergies      Medication List     STOP taking these medications    ARIPiprazole 5  MG tablet Commonly known as: ABILIFY   ibuprofen 600 MG tablet Commonly known as: ADVIL   ondansetron 4 MG disintegrating tablet Commonly known as: ZOFRAN-ODT   sertraline 50 MG tablet Commonly known as: ZOLOFT       TAKE these medications    acetaminophen 500 MG  tablet Commonly known as: TYLENOL Take 2 tablets (1,000 mg total) by mouth every 6 (six) hours.   albuterol 108 (90 Base) MCG/ACT inhaler Commonly known as: VENTOLIN HFA Inhale 2 puffs into the lungs every 6 (six) hours as needed for wheezing or shortness of breath.   CLEAR EYES OP Place 1-2 drops into both eyes daily as needed (dryness).   diazepam 10 MG tablet Commonly known as: VALIUM Take 1 tablet (10 mg total) by mouth daily as needed for anxiety.   FLUoxetine 10 MG capsule Commonly known as: PROZAC Take 1 capsule (10 mg total) by mouth daily.   folic acid 1 MG tablet Commonly known as: FOLVITE Take 1 tablet (1 mg total) by mouth daily.   naltrexone 50 MG tablet Commonly known as: DEPADE Take 50 mg by mouth daily.   pantoprazole 40 MG tablet Commonly known as: PROTONIX Take 40 mg by mouth as needed (acid reflux/ vomiting).   prenatal multivitamin Tabs tablet Take 1 tablet by mouth daily at 12 noon.   Rexulti 0.5 MG Tabs Generic drug: Brexpiprazole Take 1 tablet (0.5 mg total) by mouth daily.   thiamine 100 MG tablet Commonly known as: Vitamin B-1 Take 1 tablet (100 mg total) by mouth daily.        Follow-up Information     [facility]. Schedule an appointment as soon as possible for a visit in 1 week(s).   Contact information: [address] [place] [address] [phone]                No Known Allergies  Consultations: None   Procedures/Studies: DG Knee 4 Views W/Patella Left  Result Date: 02/26/2022 CLINICAL DATA:  Knee pain EXAM: LEFT KNEE - COMPLETE 4+ VIEW COMPARISON:  None Available. FINDINGS: No evidence of fracture, dislocation, or joint effusion. No evidence of arthropathy or other focal bone abnormality. Soft tissues are unremarkable. IMPRESSION: No acute abnormality Electronically Signed   By: Judie Petit.  Shick M.D.   On: 02/26/2022 10:19   EEG adult  Result Date: 02/25/2022 Charlsie Quest, MD     02/25/2022   6:59 PM Patient Name: Angela Christian MRN: [id number] Epilepsy Attending: Charlsie Quest Referring Physician/Provider: Burnadette Pop, MD Date: 02/25/2022 Duration: 22.06 mins Patient history: 26yo F with seizure in setting of alcohol withdrawal. EEG to evaluate for seizure. Level of alertness: Awake AEDs during EEG study: Ativan Technical aspects: This EEG study was done with scalp electrodes positioned according to the 10-20 International system of electrode placement. Electrical activity was reviewed with band pass filter of 1-70Hz , sensitivity of 7 uV/mm, display speed of 61mm/sec with a 60Hz  notched filter applied as appropriate. EEG data were recorded continuously and digitally stored.  Video monitoring was available and reviewed as appropriate. Description: The posterior dominant rhythm consists of 8-9 Hz activity of moderate voltage (25-35 uV) seen predominantly in posterior head regions, symmetric and reactive to eye opening and eye closing. There is an excessive amount of 15 to 18 Hz, 2-3 uV beta activity distributed symmetrically and diffusely.  Hyperventilation and photic stimulation were not performed.   ABNORMALITY - Excessive beta, generalized IMPRESSION: This study is within normal limits. The  excessive beta activity seen in the background is most likely due to the effect of benzodiazepine and is a benign EEG pattern. No seizures or epileptiform discharges were seen throughout the recording. Priyanka Annabelle Harman   US Abdomen Limited RUQ (LIVER/GB)  Result Date: 02/25/2022 CLINICAL DATA:  Thrombocytopenia. EXAM: ULTRASOUND ABDOMEN LIMITED RIGHT UPPER QUADRANT COMPARISON:  None Available. FINDINGS: Gallbladder: No gallstones or wall thickening visualized. No sonographic Murphy sign noted by sonographer. Common bile duct: Diameter: 2.0 mm, within normal limits Liver: No focal lesion identified. Within normal limits in parenchymal echogenicity. Portal vein is patent on color Doppler imaging with  normal direction of blood flow towards the liver. Other: None. IMPRESSION: Negative right upper quadrant ultrasound. Electronically Signed   By: Marin Roberts M.D.   On: 02/25/2022 14:46   CT Head Wo Contrast  Result Date: 02/25/2022 CLINICAL DATA:  Seizure EXAM: CT HEAD WITHOUT CONTRAST TECHNIQUE: Contiguous axial images were obtained from the base of the skull through the vertex without intravenous contrast. RADIATION DOSE REDUCTION: This exam was performed according to the departmental dose-optimization program which includes automated exposure control, adjustment of the mA and/or kV according to patient size and/or use of iterative reconstruction technique. COMPARISON:  MRI head 06/09/2013 FINDINGS: Brain: No evidence of acute infarction, hemorrhage, hydrocephalus, extra-axial collection or mass lesion/mass effect. Vascular: Negative for hyperdense vessel Skull: Negative Sinuses/Orbits: Negative Other: None IMPRESSION: Negative CT head Electronically Signed   By: Marlan Palau M.D.   On: 02/25/2022 11:45      Subjective: Patient seen and examined the bedside this morning.  Hemodynamically stable without any complaints today.  Mother at the bedside.  We discussed about the discharge planning.  Discharge Exam: Vitals:   02/27/22 0100 02/27/22 0623  BP:  135/87  Pulse: 62 (!) 45  Resp:  18  Temp:  97.7 F (36.5 C)  SpO2:  98%   Vitals:   02/26/22 2007 02/27/22 0100 02/27/22 0500 02/27/22 0623  BP: (!) 130/94   135/87  Pulse: (!) 56 62  (!) 45  Resp: 18   18  Temp: 98.1 F (36.7 C)   97.7 F (36.5 C)  TempSrc: Oral   Oral  SpO2: 100%   98%  Weight:   43 kg   Height:        General: Pt is alert, awake, not in acute distress Cardiovascular: RRR, S1/S2 +, no rubs, no gallops Respiratory: CTA bilaterally, no wheezing, no rhonchi Abdominal: Soft, NT, ND, bowel sounds + Extremities: no edema, no cyanosis    The results of significant diagnostics from this hospitalization  (including imaging, microbiology, ancillary and laboratory) are listed below for reference.     Microbiology: No results found for this or any previous visit (from the past 240 hour(s)).   Labs: BNP (last 3 results) No results for input(s): "BNP" in the last 8760 hours. Basic Metabolic Panel: Recent Labs  Lab 02/25/22 1109 02/26/22 0608  NA 141 141  K 3.9 3.6  CL 105 110  CO2 25 26  GLUCOSE 120* 86  BUN 8 7  CREATININE 0.93 0.78  CALCIUM 9.8 8.8*  MG 1.9  --   PHOS 2.6  --    Liver Function Tests: Recent Labs  Lab 02/25/22 1109  AST 57*  ALT 34  ALKPHOS 88  BILITOT 1.6*  PROT 7.6  ALBUMIN 4.1   No results for input(s): "LIPASE", "AMYLASE" in the last 168 hours. No results for input(s): "AMMONIA" in the last 168 hours. CBC:  Recent Labs  Lab 02/25/22 1109 02/26/22 0608  WBC 10.1 5.5  NEUTROABS 9.2*  --   HGB 14.0 11.4*  HCT 42.1 34.7*  MCV 102.7* 105.2*  PLT 138* 116*   Cardiac Enzymes: No results for input(s): "CKTOTAL", "CKMB", "CKMBINDEX", "TROPONINI" in the last 168 hours. BNP: Invalid input(s): "POCBNP" CBG: Recent Labs  Lab 02/25/22 1106  GLUCAP 181*   D-Dimer No results for input(s): "DDIMER" in the last 72 hours. Hgb A1c No results for input(s): "HGBA1C" in the last 72 hours. Lipid Profile No results for input(s): "CHOL", "HDL", "LDLCALC", "TRIG", "CHOLHDL", "LDLDIRECT" in the last 72 hours. Thyroid function studies No results for input(s): "TSH", "T4TOTAL", "T3FREE", "THYROIDAB" in the last 72 hours.  Invalid input(s): "FREET3" Anemia work up Recent Labs    02/25/22 1109  FOLATE 5.5*   Urinalysis    Component Value Date/Time   COLORURINE YELLOW 02/25/2022 1218   APPEARANCEUR HAZY (A) 02/25/2022 1218   LABSPEC 1.021 02/25/2022 1218   PHURINE 6.0 02/25/2022 1218   GLUCOSEU >=500 (A) 02/25/2022 1218   HGBUR NEGATIVE 02/25/2022 1218   BILIRUBINUR NEGATIVE 02/25/2022 1218   BILIRUBINUR negative 06/26/2020 1851   KETONESUR 20 (A)  02/25/2022 1218   PROTEINUR >=300 (A) 02/25/2022 1218   UROBILINOGEN 0.2 06/26/2020 1851   UROBILINOGEN 0.2 04/15/2020 1705   NITRITE NEGATIVE 02/25/2022 1218   LEUKOCYTESUR NEGATIVE 02/25/2022 1218   Sepsis Labs Recent Labs  Lab 02/25/22 1109 02/26/22 0608  WBC 10.1 5.5   Microbiology No results found for this or any previous visit (from the past 240 hour(s)).  Please note: You were cared for by a hospitalist during your hospital stay. Once you are discharged, your primary care physician will handle any further medical issues. Please note that NO REFILLS for any discharge medications will be authorized once you are discharged, as it is imperative that you return to your primary care physician (or establish a relationship with a primary care physician if you do not have one) for your post hospital discharge needs so that they can reassess your need for medications and monitor your lab values.    Time coordinating discharge: 40 minutes  SIGNED:   Burnadette Pop, MD  [facility] 02/27/2022, 10:48 AM Pager [phone]  If 7PM-7AM, please contact night-coverage [email or web address] Password [email or web address]

## 2022-04-23 ENCOUNTER — Encounter (HOSPITAL_COMMUNITY): Payer: Self-pay

## 2022-04-23 ENCOUNTER — Emergency Department (HOSPITAL_COMMUNITY)
Admission: EM | Admit: 2022-04-23 | Discharge: 2022-04-23 | Disposition: A | Discharge status: Home / Self Care | Payer: Self-pay | Attending: Emergency Medicine | Admitting: Emergency Medicine

## 2022-04-23 ENCOUNTER — Other Ambulatory Visit: Payer: Self-pay

## 2022-04-23 DIAGNOSIS — F1010 Alcohol abuse, uncomplicated: Secondary | ICD-10-CM | POA: Insufficient documentation

## 2022-04-23 DIAGNOSIS — Y908 Blood alcohol level of 240 mg/100 ml or more: Secondary | ICD-10-CM | POA: Insufficient documentation

## 2022-04-23 DIAGNOSIS — Z79899 Other long term (current) drug therapy: Secondary | ICD-10-CM | POA: Insufficient documentation

## 2022-04-23 LAB — ACETAMINOPHEN LEVEL: Acetaminophen (Tylenol), Serum: <10 ug/mL — ABNORMAL LOW (ref 10–30)

## 2022-04-23 LAB — COMPREHENSIVE METABOLIC PANEL
ALT: 50 U/L — ABNORMAL HIGH (ref 0–44)
AST: 93 U/L — ABNORMAL HIGH (ref 15–41)
Albumin: 4.6 g/dL (ref 3.5–5.0)
Alkaline Phosphatase: 76 U/L (ref 38–126)
Anion gap: 11 (ref 5–15)
BUN: 8 mg/dL (ref 6–20)
CO2: 26 mmol/L (ref 22–32)
Calcium: 9.4 mg/dL (ref 8.9–10.3)
Chloride: 105 mmol/L (ref 98–111)
Creatinine, Ser: 0.96 mg/dL (ref 0.44–1.00)
GFR, Estimated: >60 mL/min (ref >60)
Glucose, Bld: 87 mg/dL (ref 70–99)
Potassium: 4 mmol/L (ref 3.5–5.1)
Sodium: 142 mmol/L (ref 135–145)
Total Bilirubin: 0.9 mg/dL (ref 0.3–1.2)
Total Protein: 8.4 g/dL — ABNORMAL HIGH (ref 6.5–8.1)

## 2022-04-23 LAB — CBC
HCT: 45.2 % (ref 36.0–46.0)
Hemoglobin: 15 g/dL (ref 12.0–15.0)
MCH: 33.6 pg (ref 26.0–34.0)
MCHC: 33.2 g/dL (ref 30.0–36.0)
MCV: 101.1 fL — ABNORMAL HIGH (ref 80.0–100.0)
Platelets: 262 10*3/uL (ref 150–400)
RBC: 4.47 MIL/uL (ref 3.87–5.11)
RDW: 14.9 % (ref 11.5–15.5)
WBC: 5.7 10*3/uL (ref 4.0–10.5)
nRBC: 0 % (ref 0.0–0.2)

## 2022-04-23 LAB — ETHANOL: Alcohol, Ethyl (B): 247 mg/dL — ABNORMAL HIGH (ref <10)

## 2022-04-23 LAB — RAPID URINE DRUG SCREEN, HOSP PERFORMED
Amphetamines: NOT DETECTED
Barbiturates: NOT DETECTED
Benzodiazepines: NOT DETECTED
Cocaine: NOT DETECTED
Opiates: NOT DETECTED
Tetrahydrocannabinol: POSITIVE — AB

## 2022-04-23 LAB — I-STAT BETA HCG BLOOD, ED (MC, WL, AP ONLY): I-stat hCG, quantitative: <5 m[IU]/mL (ref <5)

## 2022-04-23 LAB — SALICYLATE LEVEL: Salicylate Lvl: <7 mg/dL — ABNORMAL LOW (ref 7.0–30.0)

## 2022-04-23 MED ORDER — CHLORDIAZEPOXIDE HCL 25 MG PO CAPS: ORAL_CAPSULE | ORAL | 0 refills | Status: DC

## 2022-04-23 MED ORDER — LORAZEPAM 2 MG/ML IJ SOLN
1.0000 mg | Freq: Once | INTRAMUSCULAR | Status: AC
Start: 2022-04-23 — End: 2022-04-23
  Administered 2022-04-23: 1 mg via INTRAVENOUS
  Filled 2022-04-23: qty 1

## 2022-04-23 MED ORDER — SODIUM CHLORIDE 0.9 % IV BOLUS
1000.0000 mL | Freq: Once | INTRAVENOUS | Status: AC
  Administered 2022-04-23: 1000 mL via INTRAVENOUS

## 2022-04-23 NOTE — ED Provider Notes (Signed)
[facility]EMERGENCY DEPT Provider Note   CSN: [id number] Arrival date & time: 04/23/22  1807     History  Chief Complaint  Patient presents with   Alcohol Problem    Angela Christian is a 26 y.o. female.  Pt is a 26 yo female with a pmhx significant for etoh abuse, bipolar d/o, schizophrenia, and cyclic vomiting syn.  Pt presents to the ED today for alcohol detox.  She was last here from 8/10-8/12 for an etoh w/dr seizure.  She said she went back to drinking after d/c.  She said she was exposed to trich, but did not want to take the flagyl as she was drinking.  Pt denies any current seizures. She just feels bad.       Home Medications Prior to Admission medications   Medication Sig Start Date End Date Taking? Authorizing Provider  chlordiazePOXIDE (LIBRIUM) 25 MG capsule 50mg  PO TID x 1D, then 25-50mg  PO BID X 1D, then 25-50mg  PO QD X 1D 04/23/22  Yes 06/23/22, MD  acetaminophen (TYLENOL) 500 MG tablet Take 2 tablets (1,000 mg total) by mouth every 6 (six) hours. Patient not taking: Reported on 02/25/2022 12/27/20   02/26/21, CNM  albuterol (VENTOLIN HFA) 108 (90 Base) MCG/ACT inhaler Inhale 2 puffs into the lungs every 6 (six) hours as needed for wheezing or shortness of breath. Patient not taking: Reported on 02/25/2022 06/26/20   Hall-Potvin, 14/9/21, PA-C  diazepam (VALIUM) 10 MG tablet Take 1 tablet (10 mg total) by mouth daily as needed for anxiety. 02/27/22   04/29/22, MD  FLUoxetine (PROZAC) 10 MG capsule Take 1 capsule (10 mg total) by mouth daily. 02/27/22   04/29/22, MD  folic acid (FOLVITE) 1 MG tablet Take 1 tablet (1 mg total) by mouth daily. 02/27/22   04/29/22, MD  naltrexone (DEPADE) 50 MG tablet Take 50 mg by mouth daily. 10/16/21   [provider]  Naphazoline HCl (CLEAR EYES OP) Place 1-2 drops into both eyes daily as needed (dryness).    [provider]  pantoprazole (PROTONIX) 40 MG tablet  Take 40 mg by mouth as needed (acid reflux/ vomiting). 10/07/21   [provider]  Prenatal Vit-Fe Fumarate-FA (PRENATAL MULTIVITAMIN) TABS tablet Take 1 tablet by mouth daily at 12 noon. Patient not taking: Reported on 10/27/2021    [provider]  REXULTI 0.5 MG TABS Take 1 tablet (0.5 mg total) by mouth daily. 02/27/22   04/29/22, MD  thiamine (VITAMIN B-1) 100 MG tablet Take 1 tablet (100 mg total) by mouth daily. 02/27/22   04/29/22, MD  omeprazole (PRILOSEC) 20 MG capsule Take 1 capsule (20 mg total) by mouth daily. 11/20/19 12/11/19  Darr, 12/13/19, PA-C      Allergies    Patient has no known allergies.    Review of Systems   Review of Systems  All other systems reviewed and are negative.   Physical Exam Updated Vital Signs BP 114/61 (BP Location: Left Arm)   Pulse 86   Temp 98.3 F (36.8 C) (Oral)   Resp 16   Ht 5' 4.5" (1.638 m)   Wt 43 kg   SpO2 95%   BMI 16.02 kg/m  Physical Exam Vitals and nursing note reviewed.  Constitutional:      Appearance: Normal appearance. She is underweight.  HENT:     Head: Normocephalic and atraumatic.     Right Ear: External ear normal.  Left Ear: External ear normal.     Nose: Nose normal.     Mouth/Throat:     Mouth: Mucous membranes are dry.  Eyes:     Extraocular Movements: Extraocular movements intact.     Conjunctiva/sclera: Conjunctivae normal.     Pupils: Pupils are equal, round, and reactive to light.  Cardiovascular:     Rate and Rhythm: Normal rate and regular rhythm.     Pulses: Normal pulses.     Heart sounds: Normal heart sounds.  Pulmonary:     Effort: Pulmonary effort is normal.     Breath sounds: Normal breath sounds.  Abdominal:     General: Abdomen is flat. Bowel sounds are normal.     Palpations: Abdomen is soft.  Musculoskeletal:        General: Normal range of motion.     Cervical back: Normal range of motion and neck supple.  Skin:    General: Skin is warm.     Capillary  Refill: Capillary refill takes less than 2 seconds.  Neurological:     General: No focal deficit present.     Mental Status: She is alert and oriented to person, place, and time.  Psychiatric:        Mood and Affect: Mood is anxious.     ED Results / Procedures / Treatments   Labs (all labs ordered are listed, but only abnormal results are displayed) Labs Reviewed  COMPREHENSIVE METABOLIC PANEL - Abnormal; Notable for the following components:      Result Value   Total Protein 8.4 (*)    AST 93 (*)    ALT 50 (*)    All other components within normal limits  ETHANOL - Abnormal; Notable for the following components:   Alcohol, Ethyl (B) 247 (*)    All other components within normal limits  SALICYLATE LEVEL - Abnormal; Notable for the following components:   Salicylate Lvl <7.4 (*)    All other components within normal limits  ACETAMINOPHEN LEVEL - Abnormal; Notable for the following components:   Acetaminophen (Tylenol), Serum <10 (*)    All other components within normal limits  CBC - Abnormal; Notable for the following components:   MCV 101.1 (*)    All other components within normal limits  RAPID URINE DRUG SCREEN, HOSP PERFORMED - Abnormal; Notable for the following components:   Tetrahydrocannabinol POSITIVE (*)    All other components within normal limits  WET PREP, GENITAL  I-STAT BETA HCG BLOOD, ED ([facility], [facility], [facility] ONLY)  GC/CHLAMYDIA PROBE AMP ([place]) NOT AT [facility]    EKG None  Radiology No results found.  Procedures Procedures    Medications Ordered in ED Medications  sodium chloride 0.9 % bolus 1,000 mL (0 mLs Intravenous Stopped 04/23/22 2139)  LORazepam (ATIVAN) injection 1 mg (1 mg Intravenous Given 04/23/22 2016)    ED Course/ Medical Decision Making/ A&P                           Medical Decision Making Amount and/or Complexity of Data Reviewed Labs: ordered.  Risk Prescription drug management.   This patient presents to the ED for concern  of etoh abuse, this involves an extensive number of treatment options, and is a complaint that carries with it a high risk of complications and morbidity.  The differential diagnosis includes etoh w/dr, polysubstance abuse, electrolyte abn   Co morbidities that complicate the patient evaluation  etoh abuse,  bipolar d/o, schizophrenia, and cyclic vomiting syn   Additional history obtained:  Additional history obtained from epic chart review  Lab Tests:  I Ordered, and personally interpreted labs.  The pertinent results include:  UDS + MJ; cmp with ast 93 and alt 50; cbc nl, etoh 247 and acet <10; sal <7; preg neg  Cardiac Monitoring:  The patient was maintained on a cardiac monitor.  I personally viewed and interpreted the cardiac monitored which showed an underlying rhythm of: nsr   Medicines ordered and prescription drug management:  I ordered medication including ivfs and ativan  for etoh abuse  Reevaluation of the patient after these medicines showed that the patient improved I have reviewed the patients home medicines and have made adjustments as needed    Critical Interventions:  fluids   Problem List / ED Course:  EtOH abuse:  pt is not showing any signs of etoh w/dr.  Pt's etoh is elevated, so she can't take flagyl.  She is stable for d/c.  She is to f/u with pcp.  Pt is given a Facilities manager.   Reevaluation:  After the interventions noted above, I reevaluated the patient and found that they have :improved   Social Determinants of Health:  Lives at home   Dispostion:  After consideration of the diagnostic results and the patients response to treatment, I feel that the patent would benefit from discharge with outpatient f/u.          Final Clinical Impression(s) / ED Diagnoses Final diagnoses:  Alcohol abuse    Rx / DC Orders ED Discharge Orders          Ordered    chlordiazePOXIDE (LIBRIUM) 25 MG capsule        04/23/22 2256               Jacalyn Lefevre, MD 04/23/22 2303

## 2022-04-23 NOTE — ED Triage Notes (Addendum)
BIB EMS from home for assistance with alcohol detox. Pt states she has been going to some difficult times and has been abusing alcohol, but wants to stop. Pt is tearful during triage. Last drink was this morning, pt has history of seizures from alcohol withdrawal in the past.

## 2022-04-26 LAB — GC/CHLAMYDIA PROBE AMP (~~LOC~~) NOT AT ARMC
Chlamydia: NEGATIVE
Comment: NEGATIVE
Comment: NORMAL
Neisseria Gonorrhea: NEGATIVE

## 2022-06-23 ENCOUNTER — Ambulatory Visit (INDEPENDENT_AMBULATORY_CARE_PROVIDER_SITE_OTHER): Payer: Medicaid Other | Admitting: Clinical

## 2022-06-23 ENCOUNTER — Other Ambulatory Visit: Payer: Self-pay

## 2022-06-23 ENCOUNTER — Encounter (HOSPITAL_COMMUNITY): Payer: Self-pay

## 2022-06-23 ENCOUNTER — Ambulatory Visit (INDEPENDENT_AMBULATORY_CARE_PROVIDER_SITE_OTHER): Payer: Medicaid Other | Admitting: Student in an Organized Health Care Education/Training Program

## 2022-06-23 ENCOUNTER — Encounter (HOSPITAL_COMMUNITY): Payer: Self-pay | Admitting: Student in an Organized Health Care Education/Training Program

## 2022-06-23 VITALS — BP 141/78 | HR 64 | Ht 64.5 in | Wt 109.6 lb

## 2022-06-23 DIAGNOSIS — F1029 Alcohol dependence with unspecified alcohol-induced disorder: Secondary | ICD-10-CM | POA: Diagnosis not present

## 2022-06-23 DIAGNOSIS — F3162 Bipolar disorder, current episode mixed, moderate: Secondary | ICD-10-CM | POA: Diagnosis not present

## 2022-06-23 DIAGNOSIS — F4310 Post-traumatic stress disorder, unspecified: Secondary | ICD-10-CM

## 2022-06-23 DIAGNOSIS — F1020 Alcohol dependence, uncomplicated: Secondary | ICD-10-CM

## 2022-06-23 DIAGNOSIS — F411 Generalized anxiety disorder: Secondary | ICD-10-CM

## 2022-06-23 MED ORDER — NALTREXONE HCL 50 MG PO TABS
50.0000 mg | ORAL_TABLET | Freq: Every day | ORAL | 1 refills | Status: DC
  Filled 2022-06-23: qty 30, 30d supply, fill #0

## 2022-06-23 MED ORDER — OLANZAPINE 5 MG PO TABS
ORAL_TABLET | ORAL | 1 refills | Status: DC
  Filled 2022-06-23: qty 30, 32d supply, fill #0

## 2022-06-23 MED ORDER — QUETIAPINE FUMARATE 100 MG PO TABS
50.0000 mg | ORAL_TABLET | Freq: Every day | ORAL | 1 refills | Status: DC
  Filled 2022-06-23: qty 60, 30d supply, fill #0
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
## 2022-06-23 NOTE — Progress Notes (Signed)
Comprehensive Clinical Assessment (CCA) Note  06/23/2022 Breck CoonsKeira D N Basurto [id number]  Chief Complaint:  Chief Complaint  Patient presents with   Depression   Alcohol Problem   Visit Diagnosis:  Bipolar 1 disorder, mixed, moderate Alcohol use disorder ,severe  Interpretive summary: Client is a 26 year old female [facility] as a walk-in for outpatient services.  Client presents with a caseworker coordinated through [facility] to assist with her post hospital discharge appointments.  Client presents following discharge [facility] in November 2023 related to involuntary presentation for alcohol detox.  Client reported to have hospitalizations this year in August and October related to complications with alcohol withdrawal symptoms.  Client reported she had a seizure while trying to detox on her own at home from alcohol.  Client reported drinking daily since the age of 26 and the amount she consumes depends on "how her life is going". Client reported history of mood swings, irritability, sporadic moments of sadness with crying, impulsive behaviors which include sexual behaviors, racing thoughts, and over sleeping. Client reported previous medication management at [facility] for depression, anxiety, bipolar disorder and alcohol use. Client reported her previous medications included rexulti and naltrexone. Client reported one instance of AVH while she previously tried to stop drinking. Client reported last use of alcohol on 06/22/2022 drinking 1 beer. Client reported no suicidal ideations.  Client presented oriented times five, appropriately dressed, and cooperative. Client denied hallucinations, delusions, suicidal and homicidal ideations. Client was screened for pain, nutrition, [place] suicide severity and the following SDOH:    06/23/2022   11:24 AM  GAD 7 : Generalized Anxiety Score  Nervous, Anxious, on Edge 3   Control/stop worrying 3  Worry too much - different things 3  Trouble relaxing 3  Restless 3  Easily annoyed or irritable 3  Afraid - awful might happen 3  Total GAD 7 Score 21  Anxiety Difficulty Very difficult     Flowsheet Row [facility]  PHQ-9 Total Score 21       [facility] Recommendations[facility] and psychiatric evaluation for medication management    CCA Biopsychosocial Intake/Chief Complaint:  Client reported [facility] up [facility] post discharge [facility]. Client reported she has a diagnosis history of major depressive disorder, generalized anxiety disorder, bioplar disorder and severe alcohol use.  Current Symptoms/Problems: Client reported mood swings, depressed mood, crying spells  Patient Reported Schizophrenia/Schizoaffective Diagnosis in Past: Yes  Strengths: Client reported she is voluntarily seeking outpatient services  Preferences: counseling and medication management  Abilities: to idenitify problem and needs  Type of Services Patient Feels are Needed: psychiatry  Initial Clinical Notes/Concerns: No data recorded  Mental Health Symptoms Depression:   Change in energy/activity; Tearfulness; Hopelessness; Worthlessness   Duration of Depressive symptoms:  Greater than two weeks   Mania:   None   Anxiety:    Tension; Worrying; Difficulty concentrating; Irritability   Psychosis:   None   Duration of Psychotic symptoms: No data recorded  Trauma:   Detachment from others   Obsessions:   None   Compulsions:   None   Inattention:   None   Hyperactivity/Impulsivity:   None   Oppositional/Defiant Behaviors:   None   Emotional Irregularity:   None   Other Mood/Personality Symptoms:  No data recorded   Mental Status Exam Appearance and self-care  Stature:  Small   Weight:   Average weight   Clothing:    Casual   Grooming:   Normal   Cosmetic use:   Age appropriate   Posture/gait:   Normal   Motor activity:   Not Remarkable   Sensorium  Attention:   Normal   Concentration:   Normal   Orientation:   X5   Recall/memory:   Normal   Affect and Mood  Affect:   Depressed   Mood:   Depressed   Relating  Eye contact:   Normal   Facial expression:   Depressed   Attitude toward examiner:   Cooperative   Thought and Language  Speech flow:  Clear and Coherent   Thought content:   Appropriate to Mood and Circumstances   Preoccupation:   None   Hallucinations:   None   Organization:  No data recorded  [employer] of Knowledge:   Good   Intelligence:   Average   Abstraction:   Normal   Judgement:   Good   Reality Testing:   Adequate   Insight:   Good   Decision Making:   Normal   Social Functioning  Social Maturity:   Responsible   Social Judgement:   Normal   Stress  Stressors:   Family conflict   Coping Ability:   Human resources officer Deficits:   Self-care; Self-control; Decision making   Supports:   Support needed     Religion: Religion/Spirituality Are You A Religious Person?: No  Leisure/Recreation: Leisure / Recreation Do You Have Hobbies?: No  Exercise/Diet: Exercise/Diet Do You Exercise?: No Have You Gained or Lost A Significant Amount of Weight in the Past Six Months?: No Do You Follow a Special Diet?: No Do You Have Any Trouble Sleeping?: Yes Explanation of Sleeping Difficulties: client reported difficulty falling asleep   CCA Employment/Education Employment/Work Situation: Employment / Work Situation Employment Situation: Employed 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 Client reported her children have been in DSS custody since March 2023 after she had a domestic dispute with her oldest sons father.  Childhood History:  Childhood History Additional childhood history information: Client reported she is from [place] and raised by her mother. Client reported she does not remember most of her childhood. Description of patient's relationship with caregiver when they were a child: Client reported her father was not a part of her childhood. Does patient have siblings?: Yes Number of Siblings: 6 Did patient suffer any verbal/emotional/physical/sexual abuse as a child?: No Did patient suffer from severe childhood neglect?: No Has patient ever been sexually abused/assaulted/raped as an adolescent or adult?: No Was the patient ever a victim of a crime or a disaster?: No Witnessed domestic violence?: No Has patient been affected by domestic violence as an adult?: No  Child/Adolescent Assessment:     CCA Substance Use Alcohol/Drug Use: Alcohol / Drug Use History of alcohol / drug use?: Yes Substance #1 Name of Substance 1:  alcohol 1 - Age of First Use: 16 1 - Amount (size/oz): varies 1 - Frequency: daily 1 - Last Use / Amount: 06/22/2022- 1 beer 1 - Method of Aquiring: herself 1- Route of Use: oral                       ASAM's:  Six Dimensions of Multidimensional Assessment  Dimension 1:  Acute Intoxication and/or Withdrawal Potential:   Dimension 1:  Description of individual's past and current experiences of substance use and withdrawal: Client reported voluntarily going to detox for alcohol at  [facility] in November 2023.  Dimension 2:  Biomedical Conditions and Complications:   Dimension 2:  Description of patient's biomedical conditions and  complications: Client reported she had a seizure for trying to withdrawl from alcohol on her own.  Dimension 3:  Emotional, Behavioral, or Cognitive Conditions and Complications:  Dimension 3:  Description of emotional, behavioral, or cognitive conditions and complications: Client reported mood swings and depression without suicidal ideation  Dimension 4:  Readiness to Change:  Dimension 4:  Description of Readiness to Change criteria: Client is in the precontemplation stage of change  Dimension 5:  Relapse, Continued use, or Continued Problem Potential:  Dimension 5:  Relapse, continued use, or continued problem potential critiera description: client reported she last used on 06/22/2022  Dimension 6:  Recovery/Living Environment:  Dimension 6:  Recovery/Iiving environment criteria description: Client reported she has no positive support at home.  ASAM Severity Score: ASAM's Severity Rating Score: 5  ASAM Recommended Level of Treatment: ASAM Recommended Level of Treatment: Level I Outpatient Treatment   Substance use Disorder (SUD) Substance Use Disorder (SUD)  Checklist Symptoms of Substance Use: Presence of craving or strong urge to use, Continued use despite having a persistent/recurrent physical/psychological problem caused/exacerbated by use, Evidence of tolerance, Persistent desire or unsuccessful efforts to cut down or control use  Recommendations for Services/Supports/Treatments: Recommendations for Services/Supports/Treatments Recommendations For Services/Supports/Treatments: SAIOP (Substance Abuse Intensive Outpatient Program), Medication Management  DSM5 Diagnoses: Patient Active Problem List   Diagnosis Date Noted   Alcohol withdrawal syndrome (HCC) 02/25/2022   Seizure due to alcohol withdrawal (HCC) 02/25/2022    Substance abuse (HCC) 02/25/2022   Thrombocytopenia (HCC) 02/25/2022   Alcohol withdrawal (HCC) 02/25/2022   Subclinical hyperthyroidism 10/27/2021   Schizoaffective disorder (HCC) 12/26/2020   Cesarean delivery, delivered, current hospitalization 12/26/2020   Encounter for induction of labor 12/26/2020   H/O anxiety disorder 12/26/2020   Acute blood loss anemia 12/26/2020   IDA (iron deficiency anemia) 12/25/2020   IUGR (intrauterine growth restriction) affecting care of mother 12/24/2020   Cannabis use without complication 12/24/2020   Postpartum care following cesarean delivery 12/24/2020   Major depressive disorder, recurrent episode (HCC) 09/10/2015    Patient Centered Plan: Patient is on the following Treatment Plan(s):  Depression   Referrals to Alternative Service(s): Referred to Alternative Service(s):   Place:   Date:   Time:    Referred to Alternative Service(s):   Place:   Date:   Time:    Referred to Alternative Service(s):   Place:   Date:   Time:    Referred to Alternative Service(s):   Place:   Date:   Time:      Collaboration of Care: Medication Management [facility] and Referral or follow-up with counselor/therapist [facility]OP referral.  Patient/Guardian was advised Release of Information must be obtained prior to any record release in order to collaborate their care  with an outside provider. Patient/Guardian was advised if they have not already done so to contact the registration department to sign all necessary forms in order for [place] to release information regarding their care.   Consent: Patient/Guardian gives verbal consent for treatment and assignment of benefits for services provided during this visit. Patient/Guardian expressed understanding and agreed to proceed.   Neena Rhymes Macaiah Mangal, LCSW

## 2022-07-02 ENCOUNTER — Other Ambulatory Visit: Payer: Self-pay

## 2022-07-02 ENCOUNTER — Emergency Department (HOSPITAL_COMMUNITY)
Admission: EM | Admit: 2022-07-02 | Discharge: 2022-07-02 | Disposition: A | Discharge status: Home / Self Care | Payer: Self-pay | Attending: Emergency Medicine | Admitting: Emergency Medicine

## 2022-07-02 ENCOUNTER — Encounter (HOSPITAL_COMMUNITY): Payer: Self-pay

## 2022-07-02 DIAGNOSIS — R000 Tachycardia, unspecified: Secondary | ICD-10-CM | POA: Insufficient documentation

## 2022-07-02 DIAGNOSIS — R1084 Generalized abdominal pain: Secondary | ICD-10-CM | POA: Insufficient documentation

## 2022-07-02 DIAGNOSIS — J45909 Unspecified asthma, uncomplicated: Secondary | ICD-10-CM | POA: Insufficient documentation

## 2022-07-02 DIAGNOSIS — Z7951 Long term (current) use of inhaled steroids: Secondary | ICD-10-CM | POA: Insufficient documentation

## 2022-07-02 DIAGNOSIS — F12188 Cannabis abuse with other cannabis-induced disorder: Secondary | ICD-10-CM | POA: Insufficient documentation

## 2022-07-02 DIAGNOSIS — R112 Nausea with vomiting, unspecified: Secondary | ICD-10-CM

## 2022-07-02 DIAGNOSIS — R1115 Cyclical vomiting syndrome unrelated to migraine: Secondary | ICD-10-CM | POA: Insufficient documentation

## 2022-07-02 LAB — COMPREHENSIVE METABOLIC PANEL
ALT: 23 U/L (ref 0–44)
AST: 38 U/L (ref 15–41)
Albumin: 4.9 g/dL (ref 3.5–5.0)
Alkaline Phosphatase: 65 U/L (ref 38–126)
Anion gap: 19 — ABNORMAL HIGH (ref 5–15)
BUN: 17 mg/dL (ref 6–20)
CO2: 24 mmol/L (ref 22–32)
Calcium: 11.8 mg/dL — ABNORMAL HIGH (ref 8.9–10.3)
Chloride: 92 mmol/L — ABNORMAL LOW (ref 98–111)
Creatinine, Ser: 1.2 mg/dL — ABNORMAL HIGH (ref 0.44–1.00)
GFR, Estimated: >60 mL/min (ref >60)
Glucose, Bld: 84 mg/dL (ref 70–99)
Potassium: 3.9 mmol/L (ref 3.5–5.1)
Sodium: 135 mmol/L (ref 135–145)
Total Bilirubin: 2 mg/dL — ABNORMAL HIGH (ref 0.3–1.2)
Total Protein: 8.8 g/dL — ABNORMAL HIGH (ref 6.5–8.1)

## 2022-07-02 LAB — I-STAT BETA HCG BLOOD, ED (MC, WL, AP ONLY): I-stat hCG, quantitative: <5 m[IU]/mL (ref <5)

## 2022-07-02 LAB — CBC
HCT: 47.4 % — ABNORMAL HIGH (ref 36.0–46.0)
Hemoglobin: 16.4 g/dL — ABNORMAL HIGH (ref 12.0–15.0)
MCH: 34.6 pg — ABNORMAL HIGH (ref 26.0–34.0)
MCHC: 34.6 g/dL (ref 30.0–36.0)
MCV: 100 fL (ref 80.0–100.0)
Platelets: 199 10*3/uL (ref 150–400)
RBC: 4.74 MIL/uL (ref 3.87–5.11)
RDW: 14.9 % (ref 11.5–15.5)
WBC: 8.8 10*3/uL (ref 4.0–10.5)
nRBC: 0 % (ref 0.0–0.2)

## 2022-07-02 LAB — LIPASE, BLOOD: Lipase: 26 U/L (ref 11–51)

## 2022-07-02 MED ORDER — HALOPERIDOL LACTATE 5 MG/ML IJ SOLN
5.0000 mg | Freq: Once | INTRAMUSCULAR | Status: AC
  Administered 2022-07-02: 5 mg via INTRAVENOUS
  Filled 2022-07-02: qty 1

## 2022-07-02 MED ORDER — ONDANSETRON HCL 4 MG PO TABS: 4.0000 mg | ORAL_TABLET | Freq: Four times a day (QID) | ORAL | 0 refills | Status: AC

## 2022-07-02 MED ORDER — LORAZEPAM 2 MG/ML IJ SOLN
1.0000 mg | Freq: Once | INTRAMUSCULAR | Status: AC
  Administered 2022-07-02: 1 mg via INTRAVENOUS
  Filled 2022-07-02: qty 1

## 2022-07-02 MED ORDER — ONDANSETRON 4 MG PO TBDP
4.0000 mg | ORAL_TABLET | Freq: Once | ORAL | Status: AC
  Administered 2022-07-02: 4 mg via ORAL
  Filled 2022-07-02: qty 1

## 2022-07-02 MED ORDER — SODIUM CHLORIDE 0.9 % IV BOLUS
1000.0000 mL | Freq: Once | INTRAVENOUS | Status: AC
  Administered 2022-07-02: 1000 mL via INTRAVENOUS

## 2022-07-02 NOTE — ED Provider Notes (Signed)
[facility] EMERGENCY DEPARTMENT Provider Note   CSN: [id number] Arrival date & time: 07/02/22  0825     History  Chief Complaint  Patient presents with   Abdominal Pain    Angela Christian is a 26 y.o. 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  Patient states she has more episodes of vomiting per day than she can count.  Last attempt at p.o. intake was ice chips this a.m.  Patient immediately had vomiting.  States that she has some mild generalized abdominal pain and has no associated symptoms and has had no chest pain, shortness of breath, diarrhea, dizziness, fever, dysuria, hematuria, or chills.  States Zofran never helps her nausea.  She received it approximately 2 hours before evaluation but reports no relief in her nausea from this.  States last smoked marijuana last night to "help with her nausea."  Denies being around any sick contacts or consuming any spoiled foods.      Home Medications Prior to Admission medications   Medication Sig Start Date End Date Taking? Authorizing Provider  ondansetron (ZOFRAN) 4 MG tablet Take 1 tablet (4 mg total) by mouth every 6 (six) hours for 5 doses. 07/02/22 07/04/22 Yes Rikita Grabert L, PA-C  acetaminophen (TYLENOL) 500 MG tablet Take 2 tablets (1,000 mg total) by mouth every 6 (six) hours. Patient not taking: Reported on 02/25/2022 12/27/20   Roma Schanz, CNM  albuterol (VENTOLIN HFA) 108 (90 Base) MCG/ACT inhaler Inhale 2 puffs into the lungs every 6 (six) hours as needed for wheezing or shortness of breath. Patient not taking: Reported on 02/25/2022 06/26/20   Hall-Potvin, [place], PA-C  chlordiazePOXIDE (LIBRIUM) 25 MG capsule 50mg  PO TID x 1D, then 25-50mg  PO BID X 1D, then 25-50mg  PO QD X 1D 04/23/22   06/23/22, MD  diazepam  (VALIUM) 10 MG tablet Take 1 tablet (10 mg total) by mouth daily as needed for anxiety. 02/27/22   04/29/22, MD  folic acid (FOLVITE) 1 MG tablet Take 1 tablet (1 mg total) by mouth daily. 02/27/22   04/29/22, MD  naltrexone (DEPADE) 50 MG tablet Take 1 tablet (50 mg total) by mouth daily. 06/23/22   14/6/23, MD  Naphazoline HCl (CLEAR EYES OP) Place 1-2 drops into both eyes daily as needed (dryness).    [provider]  OLANZapine (ZYPREXA) 5 MG tablet Take 0.5 tablets (2.5 mg total) by mouth at bedtime for 4 days, THEN 1 tablet (5 mg total) at bedtime thereafter. 06/23/22 07/25/22  09/23/22, MD  pantoprazole (PROTONIX) 40 MG tablet Take 40 mg by mouth as needed (acid reflux/ vomiting). 10/07/21   [provider]  Prenatal Vit-Fe Fumarate-FA (PRENATAL MULTIVITAMIN) TABS tablet Take 1 tablet by mouth daily at 12 noon. Patient not taking: Reported on 10/27/2021    [provider]  thiamine (VITAMIN B-1) 100 MG tablet Take 1 tablet (100 mg total) by mouth daily. 02/27/22   04/29/22, MD  omeprazole (PRILOSEC) 20 MG capsule Take 1 capsule (20 mg total) by mouth daily. 11/20/19 12/11/19  Darr, 12/13/19, PA-C      Allergies    Patient has no known allergies.    Review of Systems   Review of Systems  Constitutional:  Positive for appetite change. Negative for activity change, chills and fever.  HENT:  Negative for congestion, ear pain and sore throat.   Eyes:  Negative for pain and visual disturbance.  Respiratory:  Negative for cough, chest tightness and shortness of breath.   Cardiovascular:  Negative for chest pain and palpitations.  Gastrointestinal:  Positive for abdominal pain, nausea and vomiting. Negative for constipation and diarrhea.  Genitourinary:  Negative for difficulty urinating, dysuria and hematuria.  Skin:  Negative for color change and rash.  Neurological:  Negative for dizziness, seizures, syncope, weakness and  headaches.  Psychiatric/Behavioral:  Negative for agitation and confusion.   All other systems reviewed and are negative.   Physical Exam Updated Vital Signs BP (!) 131/111 (BP Location: Left Arm)   Pulse (!) 115   Temp 98.5 F (36.9 C)   Resp 20   Ht 5' 4.5" (1.638 m)   Wt 49.4 kg   SpO2 100%   BMI 18.42 kg/m  Physical Exam Vitals and nursing note reviewed.  Constitutional:      General: She is not in acute distress.    Appearance: Normal appearance. She is not ill-appearing, toxic-appearing or diaphoretic.  HENT:     Head: Normocephalic and atraumatic.     Mouth/Throat:     Mouth: Mucous membranes are dry.     Pharynx: Oropharynx is clear. No pharyngeal swelling or oropharyngeal exudate.  Eyes:     General: No scleral icterus.    Conjunctiva/sclera: Conjunctivae normal.  Cardiovascular:     Rate and Rhythm: Regular rhythm. Tachycardia present.     Heart sounds: No murmur heard. Pulmonary:     Effort: Pulmonary effort is normal.     Breath sounds: Normal breath sounds. No wheezing, rhonchi or rales.  Abdominal:     General: Abdomen is flat. There is no distension.     Palpations: Abdomen is soft.     Tenderness: There is no abdominal tenderness. There is no right CVA tenderness, left CVA tenderness, guarding or rebound.  Musculoskeletal:        General: Normal range of motion.     Cervical back: Neck supple.     Right lower leg: No edema.     Left lower leg: No edema.  Skin:    General: Skin is warm and dry.     Capillary Refill: Capillary refill takes less than 2 seconds.  Neurological:     Mental Status: She is alert. Mental status is at baseline.  Psychiatric:        Behavior: Behavior normal.     ED Results / Procedures / Treatments   Labs (all labs ordered are listed, but only abnormal results are displayed) Labs Reviewed  COMPREHENSIVE METABOLIC PANEL - Abnormal; Notable for the following components:      Result Value   Chloride 92 (*)     Creatinine, Ser 1.20 (*)    Calcium 11.8 (*)    Total Protein 8.8 (*)    Total Bilirubin 2.0 (*)    Anion gap 19 (*)    All other components within normal limits  CBC - Abnormal; Notable for the following components:   Hemoglobin 16.4 (*)    HCT 47.4 (*)    MCH 34.6 (*)    All other components within normal limits  LIPASE, BLOOD  URINALYSIS, ROUTINE W REFLEX MICROSCOPIC  I-STAT BETA HCG BLOOD, ED ([facility], [facility], [facility] ONLY)    EKG None  Radiology No results found.  Procedures None  Medications Ordered in ED Medications  ondansetron (ZOFRAN-ODT) disintegrating tablet 4 mg (  4 mg Oral Given 07/02/22 1058)  sodium chloride 0.9 % bolus 1,000 mL (1,000 mLs Intravenous New Bag/Given 07/02/22 1332)  haloperidol lactate (HALDOL) injection 5 mg (5 mg Intravenous Given 07/02/22 1332)  LORazepam (ATIVAN) injection 1 mg (1 mg Intravenous Given 07/02/22 1405)    ED Course/ Medical Decision Making/ A&P                           Medical Decision Making Amount and/or Complexity of Data Reviewed Labs: ordered. Decision-making details documented in ED Course.  Risk Prescription drug management.   26 year old female presenting to ED with acute flare of cyclical vomiting syndrome, suspect secondary to cannabinoid abuse. Pt states she has had these flares many times before and they are relieved with "anxiety medications." Abdomen benign on exam with no tenderness, guarding, rebound, or CVA tenderness to suspect appendicitis, urinary tract infection/pyelonephritis, ureterolithiasis, or otherwise acute abdomen, She did have dry, tacky mucus membranes and no improvement in her nausea post Zofran so a dose of Haldol and Ativan were ordered to treat cyclical vomiting syndrome in addition to IV fluids which pt tolerated well with significant improvement. Rechecked prior to discharge and her nausea and abdominal pain had resolved and she was tolerating PO with no difficulty. No significant electrolyte  abnormalities on her labs, unremarkable CBC including WBC, negative preg test, and her tachycardia resolved post fluids so pt is stable to discharge home with outpatient follow-up to further discuss long-term management of her cyclical vomiting syndrome. Provided with short term course of Zofran should her vomiting recur and repeatedly educated on importance of discontinuing marijuana use to best improve her long-term outcomes, better manage her nausea and vomiting, and prevent frequent ED visits in the future. Pt expressed understanding of discussion. She was persistent that marijuana improves her nausea and is not the etiology of her symptoms. Provided with educational resources at time of discharge. All questions answered. Discussed case with attending MD who agreed with management and treatment plan.         Final Clinical Impression(s) / ED Diagnoses Final diagnoses:  Cyclical vomiting syndrome  Cannabinoid hyperemesis syndrome    Rx / DC Orders ED Discharge Orders          Ordered    ondansetron (ZOFRAN) 4 MG tablet  Every 6 hours        07/02/22 1441              Richardson Dopp 07/02/22 1515    Gerhard Munch, MD 07/02/22 1623

## 2022-07-02 NOTE — Discharge Instructions (Addendum)
Thank you for allowing [place] to take care of you today.  As discussed, your physical exam and labs were reassuring and you had improvement in your nausea and vomiting following the medication and fluids we gave you in the ED.  It is very important you stop using marijuana as this can lead to long-term problems with nausea and vomiting in a disease known as Cannabinoid Hyperemesis Syndrome which I have provided further information about.  Please see your PCP in the next week for further discussion of how to manage your symptoms long-term.  I have provided a short term prescription for the anti-nausea medication, Zofran, should you need it post discharge.   If you develop severe abdominal pain, fever, chills, chest pain, shortness of breath, or other concerns, it is important you be re-evaluated by your PCP or the nearest emergency department.

## 2022-07-02 NOTE — ED Triage Notes (Signed)
Pt arrived POV from home c/o generalized abdominal pain and N/V x4 days. Pt endorses feeling weak and lightheaded as well.

## 2022-07-02 NOTE — ED Notes (Signed)
Pt approached RN asking about wait time. Endorses she is still nauseated.

## 2022-07-23 ENCOUNTER — Encounter (HOSPITAL_COMMUNITY): Payer: Self-pay

## 2022-07-23 ENCOUNTER — Telehealth (HOSPITAL_COMMUNITY): Payer: Medicaid Other | Admitting: Student in an Organized Health Care Education/Training Program

## 2022-07-23 DIAGNOSIS — F4310 Post-traumatic stress disorder, unspecified: Secondary | ICD-10-CM

## 2022-07-23 DIAGNOSIS — F3162 Bipolar disorder, current episode mixed, moderate: Secondary | ICD-10-CM

## 2022-07-23 DIAGNOSIS — F411 Generalized anxiety disorder: Secondary | ICD-10-CM

## 2022-07-23 DIAGNOSIS — F1029 Alcohol dependence with unspecified alcohol-induced disorder: Secondary | ICD-10-CM

## 2022-07-23 NOTE — Progress Notes (Signed)
[facility] MD/PA/NP OP Progress Note   Virtual Visit via Video Note  I attempted to connect with Angela Christian on 07/23/22 at 10:00 AM EST by a video enabled telemedicine application, however, she did not attend the appointment.  She will be rescheduled.  07/23/2022 10:22 AM Angela Christian  MRN:  [id number]  HPI:  Angela Christian is a 27 yr old female who presents via Virtual Video Visit for Follow Up and Medication Management. PPHx is significant for Bipolar Disorder, GAD, PTSD, and EtOH Abuse, and 2 Psychiatric Hospitalizations (latest [facility] 05/2022 Detox), and has been to Residential Rehab ([facility] 12/2021), and no history of Suicide Attempts or Self Injurious Behavior.    Angela Christian did not attend the appointment.  We will reschedule the appointment.   Visit Diagnosis:    ICD-10-CM   1. Bipolar 1 disorder, mixed, moderate (HCC)  F31.62     2. GAD (generalized anxiety disorder)  F41.1     3. PTSD (post-traumatic stress disorder)  F43.10     4. Alcohol dependence with unspecified alcohol-induced disorder ([place])  F10.29       Past Psychiatric History: Bipolar Disorder, GAD, PTSD, and EtOH Abuse, and 2 Psychiatric Hospitalizations (latest [facility] 05/2022 Detox), and has been to Residential Rehab ([facility] 12/2021), and no history of Suicide Attempts or Self Injurious Behavior.   Past Medical History:  Past Medical History:  Diagnosis Date   Anxiety    Asthma    Bipolar disease, manic ([place])    Cyclical vomiting syndrome    Depression    Dyspnea    Headache    Recurrent upper respiratory infection (URI)    Schizophrenia ([place])     Past Surgical History:  Procedure Laterality Date   CESAREAN SECTION N/A 12/24/2020   Procedure: CESAREAN SECTION;  Surgeon: Waymon Amato, MD;  Location: [facility] LD ORS;  Service: Obstetrics;  Laterality: N/A;   TONSILLECTOMY      Family Psychiatric History: Father- Bipolar Disorder, Substance abuse Mother- Anxiety, Insomnia, EtOH Abuse Multiple  members both sides - EtOH Abuse  Family History:  Family History  Problem Relation Age of Onset   Hypertension Mother    Seizures Maternal Uncle    Breast cancer Paternal Aunt    Kidney disease Maternal Grandmother     Assessment and Plan:  Angela Christian is a 27 yr old female who presents via Virtual Video Visit for Follow Up and Medication Management. PPHx is significant for Bipolar Disorder, GAD, PTSD, and EtOH Abuse, and 2 Psychiatric Hospitalizations (latest [facility] 05/2022 Detox), and has been to Residential Rehab ([facility] 12/2021), and no history of Suicide Attempts or Self Injurious Behavior.    Angela Christian did not attend the appointment.  We will reschedule the appointment.     Briant Cedar, MD 07/23/2022, 10:22 AM

## 2022-08-06 ENCOUNTER — Inpatient Hospital Stay (HOSPITAL_COMMUNITY)
Admission: AD | Admit: 2022-08-06 | Discharge: 2022-08-06 | Disposition: A | Discharge status: Home / Self Care | Payer: Self-pay | Attending: Obstetrics & Gynecology | Admitting: Obstetrics & Gynecology

## 2022-08-06 ENCOUNTER — Other Ambulatory Visit: Payer: Self-pay

## 2022-08-06 DIAGNOSIS — Z3202 Encounter for pregnancy test, result negative: Secondary | ICD-10-CM | POA: Insufficient documentation

## 2022-08-06 LAB — POCT PREGNANCY, URINE: Preg Test, Ur: NEGATIVE

## 2022-08-06 NOTE — Discharge Instructions (Signed)
Prenatal Care Providers           [facility]  [address] [phone]  [facility]   [address]  [phone]  [facility]       [address] [phone]            [facility]     [phone]          [facility] @ [facility]   [address] [phone]  [facility]  [address] [phone]     [facility] ([place])  [address]   [phone]     [facility]  Phone: [phone]  [facility]  Phone: [phone]  [facility] Phone: [phone]  [facility] Phone: [phone]  [facility] Phone: [phone]  [facility] Phone: [phone]  [facility]  Phone: [phone]  

## 2022-08-06 NOTE — MAU Note (Signed)
Angela Christian is a 27 y.o. at Unknown here in MAU reporting: she had a negative UPT at [facility] today and wants confirmation with a blood test.  Reports hasn't had a period since November.  Has had vomiting for approx 4 weeks. LMP: November Onset of complaint: 4 weeks Pain score: 0 There were no vitals filed for this visit.   FHT:NA Lab orders placed from triage:   UPT

## 2022-08-06 NOTE — MAU Provider Note (Signed)
Event Date/Time  First Provider Initiated Contact with Patient 08/06/22 1427     S Angela Christian is a 27 y.o. [phone] patient who presents to MAU today requesting serum pregnancy test. She was evaluated at [facility] earlier today where they obtained a negative UPT. She has not taken a home pregnancy test. Patient states she called the MAU front desk prior to arriving and was told she could have a serum pregnancy test. She reports vomiting but is otherwise without complaint.  O BP 123/70 (BP Location: Right Arm)   Pulse 79   Temp 98.4 F (36.9 C) (Oral)   Resp 18   Ht 5' 4.5" (1.638 m)   Wt 48.3 kg   SpO2 100%   BMI 17.98 kg/m    Physical Exam Vitals and nursing note reviewed. Exam conducted with a chaperone present.  Constitutional:      General: She is not in acute distress.    Appearance: She is not ill-appearing.  Pulmonary:     Effort: Pulmonary effort is normal.  Abdominal:     General: Abdomen is flat.  Skin:    Capillary Refill: Capillary refill takes less than 2 seconds.  Neurological:     Mental Status: She is alert and oriented to person, place, and time.  Psychiatric:        Mood and Affect: Mood normal.        Behavior: Behavior normal.        Thought Content: Thought content normal.        Judgment: Judgment normal.     A Medical screening exam complete No home UPT Negative UPT in MAU No indication for Quant hCG. Apology offered to patient. We perform quant hCGs in context of larger high acuity workup, not indicated at current time  P Discharge from MAU in stable condition  F/U: Patient given list of OB/GYNs with [facility] privileges she can contact to make appointment for [place], [place], [place] 08/06/2022 2:58 PM

## 2022-08-11 ENCOUNTER — Other Ambulatory Visit: Payer: Self-pay

## 2022-08-11 ENCOUNTER — Emergency Department (HOSPITAL_COMMUNITY)
Admission: EM | Admit: 2022-08-11 | Discharge: 2022-08-12 | Discharge status: Against Medical Advice | Payer: Self-pay | Attending: Emergency Medicine | Admitting: Emergency Medicine

## 2022-08-11 ENCOUNTER — Encounter (HOSPITAL_COMMUNITY): Payer: Self-pay | Admitting: Emergency Medicine

## 2022-08-11 DIAGNOSIS — R112 Nausea with vomiting, unspecified: Secondary | ICD-10-CM | POA: Insufficient documentation

## 2022-08-11 DIAGNOSIS — R1030 Lower abdominal pain, unspecified: Secondary | ICD-10-CM | POA: Insufficient documentation

## 2022-08-11 DIAGNOSIS — R197 Diarrhea, unspecified: Secondary | ICD-10-CM | POA: Insufficient documentation

## 2022-08-11 DIAGNOSIS — Z5321 Procedure and treatment not carried out due to patient leaving prior to being seen by health care provider: Secondary | ICD-10-CM | POA: Insufficient documentation

## 2022-08-11 NOTE — ED Triage Notes (Signed)
Pt c/o lower abdominal/cramping pain and n/v/d for the past 3-4 days. Denies fever/chills. Endorses weakness, shob. Pt states she has not had a period since November but had negative pregnancy test at home.

## 2022-08-12 ENCOUNTER — Emergency Department (HOSPITAL_BASED_OUTPATIENT_CLINIC_OR_DEPARTMENT_OTHER)
Admission: EM | Admit: 2022-08-12 | Discharge: 2022-08-12 | Disposition: A | Discharge status: Home / Self Care | Payer: Self-pay | Attending: Emergency Medicine | Admitting: Emergency Medicine

## 2022-08-12 ENCOUNTER — Encounter (HOSPITAL_BASED_OUTPATIENT_CLINIC_OR_DEPARTMENT_OTHER): Payer: Self-pay

## 2022-08-12 ENCOUNTER — Other Ambulatory Visit: Payer: Self-pay

## 2022-08-12 DIAGNOSIS — R1084 Generalized abdominal pain: Secondary | ICD-10-CM | POA: Insufficient documentation

## 2022-08-12 DIAGNOSIS — R112 Nausea with vomiting, unspecified: Secondary | ICD-10-CM | POA: Insufficient documentation

## 2022-08-12 DIAGNOSIS — R000 Tachycardia, unspecified: Secondary | ICD-10-CM | POA: Insufficient documentation

## 2022-08-12 LAB — CBC
HCT: 46.3 % — ABNORMAL HIGH (ref 36.0–46.0)
Hemoglobin: 16 g/dL — ABNORMAL HIGH (ref 12.0–15.0)
MCH: 36 pg — ABNORMAL HIGH (ref 26.0–34.0)
MCHC: 34.6 g/dL (ref 30.0–36.0)
MCV: 104.3 fL — ABNORMAL HIGH (ref 80.0–100.0)
Platelets: 242 10*3/uL (ref 150–400)
RBC: 4.44 MIL/uL (ref 3.87–5.11)
RDW: 16 % — ABNORMAL HIGH (ref 11.5–15.5)
WBC: 8 10*3/uL (ref 4.0–10.5)
nRBC: 0 % (ref 0.0–0.2)

## 2022-08-12 LAB — PREGNANCY, URINE: Preg Test, Ur: NEGATIVE

## 2022-08-12 LAB — URINALYSIS, ROUTINE W REFLEX MICROSCOPIC
Bacteria, UA: NONE SEEN
Glucose, UA: NEGATIVE mg/dL
Ketones, ur: 15 mg/dL — AB
Leukocytes,Ua: NEGATIVE
Nitrite: NEGATIVE
Protein, ur: 30 mg/dL — AB
RBC / HPF: >50 RBC/hpf (ref 0–5)
Specific Gravity, Urine: 1.033 — ABNORMAL HIGH (ref 1.005–1.030)
pH: 5.5 (ref 5.0–8.0)

## 2022-08-12 LAB — COMPREHENSIVE METABOLIC PANEL
ALT: 29 U/L (ref 0–44)
AST: 37 U/L (ref 15–41)
Albumin: 5.2 g/dL — ABNORMAL HIGH (ref 3.5–5.0)
Alkaline Phosphatase: 63 U/L (ref 38–126)
Anion gap: 13 (ref 5–15)
BUN: 15 mg/dL (ref 6–20)
CO2: 26 mmol/L (ref 22–32)
Calcium: 11.4 mg/dL — ABNORMAL HIGH (ref 8.9–10.3)
Chloride: 94 mmol/L — ABNORMAL LOW (ref 98–111)
Creatinine, Ser: 1.06 mg/dL — ABNORMAL HIGH (ref 0.44–1.00)
GFR, Estimated: >60 mL/min (ref >60)
Glucose, Bld: 117 mg/dL — ABNORMAL HIGH (ref 70–99)
Potassium: 3.6 mmol/L (ref 3.5–5.1)
Sodium: 133 mmol/L — ABNORMAL LOW (ref 135–145)
Total Bilirubin: 0.9 mg/dL (ref 0.3–1.2)
Total Protein: 9 g/dL — ABNORMAL HIGH (ref 6.5–8.1)

## 2022-08-12 LAB — LIPASE, BLOOD: Lipase: <10 U/L — ABNORMAL LOW (ref 11–51)

## 2022-08-12 MED ORDER — KETOROLAC TROMETHAMINE 15 MG/ML IJ SOLN
15.0000 mg | Freq: Once | INTRAMUSCULAR | Status: AC
  Administered 2022-08-12: 15 mg via INTRAVENOUS
  Filled 2022-08-12: qty 1

## 2022-08-12 MED ORDER — LACTATED RINGERS IV BOLUS
2000.0000 mL | Freq: Once | INTRAVENOUS | Status: AC
  Administered 2022-08-12: 2000 mL via INTRAVENOUS

## 2022-08-12 MED ORDER — PROMETHAZINE HCL 25 MG PO TABS: 25.0000 mg | ORAL_TABLET | Freq: Four times a day (QID) | ORAL | 0 refills | Status: DC | PRN

## 2022-08-12 MED ORDER — DROPERIDOL 2.5 MG/ML IJ SOLN
2.5000 mg | Freq: Once | INTRAMUSCULAR | Status: AC
  Administered 2022-08-12: 2.5 mg via INTRAVENOUS
  Filled 2022-08-12: qty 2

## 2022-08-12 NOTE — ED Notes (Signed)
Persistent continual NV

## 2022-08-12 NOTE — ED Notes (Signed)
Pt called 3x no answer  

## 2022-08-12 NOTE — ED Provider Notes (Signed)
[facility] Provider Note   CSN: [id number] Arrival date & time: 08/12/22  1236     History  Chief Complaint  Patient presents with   Vomiting    Angela Christian is a 27 y.o. female.  Patient presents to the emergency department for evaluation of nausea and vomiting that she states has been going on for "a few days".  Patient has a history of the same.  History of alcohol use and marijuana use, cyclic vomiting.  Currently endorses generalized abdominal pain.  No diarrhea today.  No dysuria, increased frequency or urgency.  She denies fevers.  She has been taking Tums at home.  She denies chronic PPI or H2 blocker.  Reports history of C-section.       Home Medications Prior to Admission medications   Medication Sig Start Date End Date Taking? Authorizing Provider  acetaminophen (TYLENOL) 500 MG tablet Take 2 tablets (1,000 mg total) by mouth every 6 (six) hours. Patient not taking: Reported on 02/25/2022 12/27/20   Arrie Eastern, CNM  albuterol (VENTOLIN HFA) 108 (90 Base) MCG/ACT inhaler Inhale 2 puffs into the lungs every 6 (six) hours as needed for wheezing or shortness of breath. Patient not taking: Reported on 02/25/2022 06/26/20   Hall-Potvin, [place], PA-C  chlordiazePOXIDE (LIBRIUM) 25 MG capsule 50mg  PO TID x 1D, then 25-50mg  PO BID X 1D, then 25-50mg  PO QD X 1D 04/23/22   Isla Pence, MD  diazepam (VALIUM) 10 MG tablet Take 1 tablet (10 mg total) by mouth daily as needed for anxiety. 02/27/22   Shelly Coss, MD  folic acid (FOLVITE) 1 MG tablet Take 1 tablet (1 mg total) by mouth daily. 02/27/22   Shelly Coss, MD  naltrexone (DEPADE) 50 MG tablet Take 1 tablet (50 mg total) by mouth daily. 06/23/22   Briant Cedar, MD  Naphazoline HCl (CLEAR EYES OP) Place 1-2 drops into both eyes daily as needed (dryness).    [provider]  OLANZapine (ZYPREXA) 5 MG tablet Take 0.5 tablets (2.5 mg total) by mouth at  bedtime for 4 days, THEN 1 tablet (5 mg total) at bedtime thereafter. 06/23/22 07/25/22  Briant Cedar, MD  pantoprazole (PROTONIX) 40 MG tablet Take 40 mg by mouth as needed (acid reflux/ vomiting). 10/07/21   [provider]  Prenatal Vit-Fe Fumarate-FA (PRENATAL MULTIVITAMIN) TABS tablet Take 1 tablet by mouth daily at 12 noon. Patient not taking: Reported on 10/27/2021    [provider]  thiamine (VITAMIN B-1) 100 MG tablet Take 1 tablet (100 mg total) by mouth daily. 02/27/22   Shelly Coss, MD  omeprazole (PRILOSEC) 20 MG capsule Take 1 capsule (20 mg total) by mouth daily. 11/20/19 12/11/19  Darr, Edison Nasuti, PA-C      Allergies    Patient has no known allergies.    Review of Systems   Review of Systems  Physical Exam Updated Vital Signs BP 126/82 (BP Location: Right Arm)   Pulse (!) 124   Temp 98.8 F (37.1 C)   Resp 15   Ht 5\' 4"  (1.626 m)   Wt 48.1 kg   LMP 05/19/2022 (Approximate)   SpO2 99%   BMI 18.20 kg/m  Physical Exam Vitals and nursing note reviewed.  Constitutional:      General: She is in acute distress.     Appearance: She is well-developed.     Comments: Actively vomiting during exam  HENT:     Head: Normocephalic and  atraumatic.     Right Ear: External ear normal.     Left Ear: External ear normal.     Nose: Nose normal.  Eyes:     Conjunctiva/sclera: Conjunctivae normal.  Cardiovascular:     Rate and Rhythm: Regular rhythm. Tachycardia present.     Heart sounds: No murmur heard. Pulmonary:     Effort: No respiratory distress.     Breath sounds: No wheezing, rhonchi or rales.  Abdominal:     Palpations: Abdomen is soft.     Tenderness: There is abdominal tenderness. There is no guarding or rebound.     Comments: Generalized abdominal tenderness  Musculoskeletal:     Cervical back: Normal range of motion and neck supple.     Right lower leg: No edema.     Left lower leg: No edema.  Skin:    General: Skin is warm and dry.      Findings: No rash.  Neurological:     General: No focal deficit present.     Mental Status: She is alert. Mental status is at baseline.     Motor: No weakness.  Psychiatric:        Mood and Affect: Mood normal.     ED Results / Procedures / Treatments   Labs (all labs ordered are listed, but only abnormal results are displayed) Labs Reviewed  LIPASE, BLOOD - Abnormal; Notable for the following components:      Result Value   Lipase <10 (*)    All other components within normal limits  COMPREHENSIVE METABOLIC PANEL - Abnormal; Notable for the following components:   Sodium 133 (*)    Chloride 94 (*)    Glucose, Bld 117 (*)    Creatinine, Ser 1.06 (*)    Calcium 11.4 (*)    Total Protein 9.0 (*)    Albumin 5.2 (*)    All other components within normal limits  CBC - Abnormal; Notable for the following components:   Hemoglobin 16.0 (*)    HCT 46.3 (*)    MCV 104.3 (*)    MCH 36.0 (*)    RDW 16.0 (*)    All other components within normal limits  URINALYSIS, ROUTINE W REFLEX MICROSCOPIC - Abnormal; Notable for the following components:   Specific Gravity, Urine 1.033 (*)    Hgb urine dipstick LARGE (*)    Bilirubin Urine SMALL (*)    Ketones, ur 15 (*)    Protein, ur 30 (*)    All other components within normal limits  PREGNANCY, URINE    EKG None  Radiology No results found.  Procedures Procedures    Medications Ordered in ED Medications  lactated ringers bolus 2,000 mL (0 mLs Intravenous Stopped 08/12/22 1707)  droperidol (INAPSINE) 2.5 MG/ML injection 2.5 mg (2.5 mg Intravenous Given 08/12/22 1540)  ketorolac (TORADOL) 15 MG/ML injection 15 mg (15 mg Intravenous Given 08/12/22 1541)    ED Course/ Medical Decision Making/ A&P    Patient seen and examined. History obtained directly from patient. Work-up including labs, imaging, EKG ordered in triage, if performed, were reviewed.    Labs/EKG: Independently reviewed and interpreted.  This included: CBC with  differential shows normal white blood cell count, elevated hemoglobin at 16, elevated MCV; CMP slightly low sodium and chloride, mildly elevated creatinine at 1.06, normal liver function testing; lipase less than 10; UA with elevated red blood cells consistent with patient's history of vaginal bleeding (spotting).  Imaging: None ordered, may consider CT if symptoms  or not improved with treatment.  Medications/Fluids: LR bolus, IV Toradol, IV droperidol  Most recent vital signs reviewed and are as follows: BP 126/82 (BP Location: Right Arm)   Pulse (!) 124   Temp 98.8 F (37.1 C)   Resp 15   Ht 5\' 4"  (1.626 m)   Wt 48.1 kg   LMP 05/19/2022 (Approximate)   SpO2 99%   BMI 18.20 kg/m   Initial impression: Nausea, vomiting, history of same   5:26 PM Reassessment performed. Patient appears improved.  Exam is unchanged.  States that she is feeling better after IV fluids, antiemetic and Toradol.  She is tolerating water.  She is agreeable to discharge.  Reviewed pertinent lab work and imaging with patient at bedside. Questions answered.   Most current vital signs reviewed and are as follows: BP (!) 104/47   Pulse 84   Temp 98.8 F (37.1 C)   Resp 18   Ht 5\' 4"  (1.626 m)   Wt 48.1 kg   LMP 05/19/2022 (Approximate)   SpO2 100%   BMI 18.20 kg/m   Plan: Discharge to home.  Prescriptions written for: Oral Phenergan  Other home care instructions discussed: Clear liquids tonight and then bland diet for the next 24 to 48 hours.  ED return instructions discussed: The patient was urged to return to the Emergency Department immediately with worsening of current symptoms, worsening abdominal pain, persistent vomiting, blood noted in stools, fever, or any other concerns. The patient verbalized understanding.   Follow-up instructions discussed: Patient encouraged to follow-up with their PCP in 3 days.     Click here for ABCD2, HEART and other calculatorsREFRESH Note before signing :1}                           Medical Decision Making Amount and/or Complexity of Data Reviewed Labs: ordered.  Risk Prescription drug management.   For this patient's complaint of abdominal pain, the following conditions were considered on the differential diagnosis: gastritis/PUD, enteritis/duodenitis, appendicitis, cholelithiasis/cholecystitis, cholangitis, pancreatitis, ruptured viscus, colitis, diverticulitis, small/large bowel obstruction, proctitis, cystitis, pyelonephritis, ureteral colic, aortic dissection, aortic aneurysm. In women, ectopic pregnancy, pelvic inflammatory disease, ovarian cysts, and tubo-ovarian abscess were also considered. Atypical chest etiologies were also considered including ACS, PE, and pneumonia.  The patient's vital signs, pertinent lab work and imaging were reviewed and interpreted as discussed in the ED course. Hospitalization was considered for further testing, treatments, or serial exams/observation. However as patient is well-appearing, has a stable exam, and reassuring studies today, I do not feel that they warrant admission at this time. This plan was discussed with the patient who verbalizes agreement and comfort with this plan and seems reliable and able to return to the Emergency Department with worsening or changing symptoms.          Final Clinical Impression(s) / ED Diagnoses Final diagnoses:  Nausea and vomiting, unspecified vomiting type    Rx / DC Orders ED Discharge Orders          Ordered    promethazine (PHENERGAN) 25 MG tablet  Every 6 hours PRN,   Status:  Discontinued        08/12/22 1724    promethazine (PHENERGAN) 25 MG tablet  Every 6 hours PRN        08/12/22 1726              08/14/22, PA-C 08/12/22 1727    Renne Crigler, MD 08/13/22 2326

## 2022-08-12 NOTE — ED Triage Notes (Signed)
Patient here POV from Home.  Endorses N/V that began 4-5 Days ago. Some Diarrhea as well. Some RLQ ABD Pain that began 1 Week ago.   Has been without Menstrual Cycle since November but has been spotting since. No fevers.   NAD Noted during Triage. A&Ox4. GCS 15. Ambulatory.

## 2022-08-12 NOTE — Discharge Instructions (Signed)
Please read and follow all provided instructions.  Your diagnoses today include:  1. Nausea and vomiting, unspecified vomiting type     TTests performed today include: Blood cell counts and platelets Kidney and liver function tests Pancreas function test (called lipase) Urine test to look for infection A blood or urine test for pregnancy (women only) Vital signs. See below for your results today.   Medications prescribed:  Phenergan (promethazine) - for nausea and vomiting  Take any prescribed medications only as directed.  Home care instructions:  Follow any educational materials contained in this packet.  Your abdominal pain, nausea, vomiting, and diarrhea may be caused by a viral gastroenteritis also called 'stomach flu'. You should rest for the next several days. Keep drinking plenty of fluids and use the medicine for nausea as directed.   Drink clear liquids for the next 24 hours and introduce solid foods slowly after 24 hours using the b.r.a.t. diet (Bananas, Rice, Applesauce, Toast, Yogurt).    Follow-up instructions: Please follow-up with your primary care provider in the next 2 days for further evaluation of your symptoms. If you are not feeling better in 48 hours you may have a condition that is more serious and you need re-evaluation.   Return instructions:  SEEK IMMEDIATE MEDICAL ATTENTION IF: If you have pain that does not go away or becomes severe  A temperature above 101F develops  Repeated vomiting occurs (multiple episodes)  If you have pain that becomes localized to portions of the abdomen. The right side could possibly be appendicitis. In an adult, the left lower portion of the abdomen could be colitis or diverticulitis.  Blood is being passed in stools or vomit (bright red or black tarry stools)  You develop chest pain, difficulty breathing, dizziness or fainting, or become confused, poorly responsive, or inconsolable (young children) If you have any other  emergent concerns regarding your health  Additional Information: Abdominal (belly) pain can be caused by many things. Your caregiver performed an examination and possibly ordered blood/urine tests and imaging (CT scan, x-rays, ultrasound). Many cases can be observed and treated at home after initial evaluation in the emergency department. Even though you are being discharged home, abdominal pain can be unpredictable. Therefore, you need a repeated exam if your pain does not resolve, returns, or worsens. Most patients with abdominal pain don't have to be admitted to the hospital or have surgery, but serious problems like appendicitis and gallbladder attacks can start out as nonspecific pain. Many abdominal conditions cannot be diagnosed in one visit, so follow-up evaluations are very important.  Your vital signs today were: BP (!) 104/47   Pulse 84   Temp 98.8 F (37.1 C)   Resp 18   Ht 5\' 4"  (1.626 m)   Wt 48.1 kg   LMP 05/19/2022 (Approximate)   SpO2 100%   BMI 18.20 kg/m  If your blood pressure (bp) was elevated above 135/85 this visit, please have this repeated by your doctor within one month. --------------

## 2022-08-12 NOTE — ED Notes (Signed)
Discharge paperwork given and verbally understood. 

## 2022-08-12 NOTE — ED Notes (Signed)
Pt requested to leave... Provider informed and aware.Angela KitchenMarland Christian

## 2022-08-12 NOTE — ED Notes (Signed)
Provider requested a PO challenge... Pt was able to drink a cup of water without feeling N/V.Marland KitchenMarland Kitchen

## 2022-08-20 ENCOUNTER — Ambulatory Visit (HOSPITAL_COMMUNITY)
Admission: RE | Admit: 2022-08-20 | Discharge: 2022-08-20 | Disposition: A | Discharge status: Home / Self Care | Payer: Self-pay | Source: Ambulatory Visit | Attending: Family Medicine | Admitting: Family Medicine

## 2022-08-20 ENCOUNTER — Encounter (HOSPITAL_COMMUNITY): Payer: Self-pay

## 2022-08-20 VITALS — BP 119/78 | HR 71 | Temp 97.7°F | Resp 16

## 2022-08-20 DIAGNOSIS — R1110 Vomiting, unspecified: Secondary | ICD-10-CM

## 2022-08-20 DIAGNOSIS — N911 Secondary amenorrhea: Secondary | ICD-10-CM

## 2022-08-20 LAB — HCG, QUANTITATIVE, PREGNANCY: hCG, Beta Chain, Quant, S: 1 m[IU]/mL (ref <5)

## 2022-08-20 LAB — TSH: TSH: 1.016 u[IU]/mL (ref 0.350–4.500)

## 2022-08-20 LAB — POCT URINALYSIS DIPSTICK, ED / UC
Bilirubin Urine: NEGATIVE
Glucose, UA: NEGATIVE mg/dL
Hgb urine dipstick: NEGATIVE
Ketones, ur: NEGATIVE mg/dL
Leukocytes,Ua: NEGATIVE
Nitrite: NEGATIVE
Protein, ur: NEGATIVE mg/dL
Specific Gravity, Urine: <=1.005 (ref 1.005–1.030)
Urobilinogen, UA: 0.2 mg/dL (ref 0.0–1.0)
pH: 6 (ref 5.0–8.0)

## 2022-08-20 LAB — POC URINE PREG, ED: Preg Test, Ur: NEGATIVE

## 2022-08-20 NOTE — ED Provider Notes (Addendum)
[place]    CSN: [id number] Arrival date & time: 08/20/22  1226      History   Chief Complaint Chief Complaint  Patient presents with   Abdominal Pain    VomitingBack painUrination frequencyRecent hospital visit - Entered by patient    HPI Angela Christian is a 27 y.o. female.    Abdominal Pain  Here for vomiting since December. LMP was in November of 2023.   She has had neg UPT at [facility] and at [facility] 08/06/22. No serum preg done since no other acute symptoms at the time.  Seen at ED for same 1/25. UPT neg at that time. Dry on labs and was given IVF.   She continues to vomit and has low back pain now.  She is having urinary frequency but no dysuria.  She has vomiting about once a day.  She is able to get food and fluids in.  She states with her last pregnancy she did have false negative urine pregnancy test  Past Medical History:  Diagnosis Date   Anxiety    Asthma    Bipolar disease, manic (HCC)    Cyclical vomiting syndrome    Depression    Dyspnea    Headache    Recurrent upper respiratory infection (URI)    Schizophrenia ([place])     Patient Active Problem List   Diagnosis Date Noted   Alcohol withdrawal syndrome ([place]) 02/25/2022   Seizure due to alcohol withdrawal ([place]) 02/25/2022   Substance abuse ([place]) 02/25/2022   Thrombocytopenia ([place]) 02/25/2022   Alcohol withdrawal ([place]) 02/25/2022   Subclinical hyperthyroidism 10/27/2021   Schizoaffective disorder ([place]) 12/26/2020   Cesarean delivery, delivered, current hospitalization 12/26/2020   Encounter for induction of labor 12/26/2020   H/O anxiety disorder 12/26/2020   Acute blood loss anemia 12/26/2020   IDA (iron deficiency anemia) 12/25/2020   IUGR (intrauterine growth restriction) affecting care of mother 12/24/2020   Cannabis use without complication 56/43/3295   Postpartum care following cesarean delivery 12/24/2020   Major depressive disorder, recurrent episode ([place])  09/10/2015    Past Surgical History:  Procedure Laterality Date   CESAREAN SECTION N/A 12/24/2020   Procedure: CESAREAN SECTION;  Surgeon: Waymon Amato, MD;  Location: [facility] LD ORS;  Service: Obstetrics;  Laterality: N/A;   TONSILLECTOMY      OB History     Gravida  2   Para  2   Term  2   Preterm  0   AB  0   Living  2      SAB  0   IAB  0   Ectopic  0   Multiple  0   Live Births  2            Home Medications    Prior to Admission medications   Medication Sig Start Date End Date Taking? Authorizing Provider  albuterol (VENTOLIN HFA) 108 (90 Base) MCG/ACT inhaler Inhale 2 puffs into the lungs every 6 (six) hours as needed for wheezing or shortness of breath. Patient not taking: Reported on 02/25/2022 06/26/20   Hall-Potvin, [place], PA-C  chlordiazePOXIDE (LIBRIUM) 25 MG capsule 50mg  PO TID x 1D, then 25-50mg  PO BID X 1D, then 25-50mg  PO QD X 1D 04/23/22   Isla Pence, MD  diazepam (VALIUM) 10 MG tablet Take 1 tablet (10 mg total) by mouth daily as needed for anxiety. 02/27/22   Shelly Coss, MD  folic acid (FOLVITE) 1 MG tablet Take 1 tablet (1  mg total) by mouth daily. 02/27/22   Shelly Coss, MD  naltrexone (DEPADE) 50 MG tablet Take 1 tablet (50 mg total) by mouth daily. 06/23/22   Briant Cedar, MD  Naphazoline HCl (CLEAR EYES OP) Place 1-2 drops into both eyes daily as needed (dryness).    [provider]  OLANZapine (ZYPREXA) 5 MG tablet Take 0.5 tablets (2.5 mg total) by mouth at bedtime for 4 days, THEN 1 tablet (5 mg total) at bedtime thereafter. 06/23/22 07/25/22  Briant Cedar, MD  pantoprazole (PROTONIX) 40 MG tablet Take 40 mg by mouth as needed (acid reflux/ vomiting). 10/07/21   [provider]  Prenatal Vit-Fe Fumarate-FA (PRENATAL MULTIVITAMIN) TABS tablet Take 1 tablet by mouth daily at 12 noon. Patient not taking: Reported on 10/27/2021    [provider]  promethazine (PHENERGAN) 25 MG tablet Take 1  tablet (25 mg total) by mouth every 6 (six) hours as needed for nausea or vomiting. 08/12/22   Carlisle Cater, PA-C  thiamine (VITAMIN B-1) 100 MG tablet Take 1 tablet (100 mg total) by mouth daily. 02/27/22   Shelly Coss, MD  omeprazole (PRILOSEC) 20 MG capsule Take 1 capsule (20 mg total) by mouth daily. 11/20/19 12/11/19  Darr, Edison Nasuti, PA-C    Family History Family History  Problem Relation Age of Onset   Hypertension Mother    Seizures Maternal Uncle    Breast cancer Paternal Aunt    Kidney disease Maternal Grandmother     Social History Social History   Tobacco Use   Smoking status: Every Day    Packs/day: 0.50    Types: Cigarettes   Smokeless tobacco: Never  Vaping Use   Vaping Use: Never used  Substance Use Topics   Alcohol use: Yes    Alcohol/week: 14.0 standard drinks of alcohol    Types: 14 Standard drinks or equivalent per week    Comment: drinks 1-2 beers a day   Drug use: Yes    Types: Marijuana    Comment: 6.2.2022     Allergies   Patient has no known allergies.   Review of Systems Review of Systems  Gastrointestinal:  Positive for abdominal pain.     Physical Exam Triage Vital Signs ED Triage Vitals [08/20/22 1253]  Enc Vitals Group     BP 119/78     Pulse Rate 71     Resp 16     Temp 97.7 F (36.5 C)     Temp Source Oral     SpO2 98 %     Weight      Height      Head Circumference      Peak Flow      Pain Score      Pain Loc      Pain Edu?      Excl. in [place]?    No data found.  Updated Vital Signs BP 119/78 (BP Location: Left Arm)   Pulse 71   Temp 97.7 F (36.5 C) (Oral)   Resp 16   LMP 05/19/2022 (Approximate)   SpO2 98%   Visual Acuity Right Eye Distance:   Left Eye Distance:   Bilateral Distance:    Right Eye Near:   Left Eye Near:    Bilateral Near:     Physical Exam Vitals reviewed.  Constitutional:      General: She is not in acute distress.    Appearance: She is not toxic-appearing.  HENT:     Nose: Nose  normal.     Mouth/Throat:     Mouth: Mucous membranes are moist.     Pharynx: No oropharyngeal exudate or posterior oropharyngeal erythema.  Eyes:     Extraocular Movements: Extraocular movements intact.     Conjunctiva/sclera: Conjunctivae normal.     Pupils: Pupils are equal, round, and reactive to light.  Cardiovascular:     Rate and Rhythm: Normal rate and regular rhythm.     Heart sounds: No murmur heard. Pulmonary:     Effort: Pulmonary effort is normal. No respiratory distress.     Breath sounds: No stridor. No wheezing, rhonchi or rales.  Chest:     Chest wall: No tenderness.  Abdominal:     Palpations: Abdomen is soft.     Tenderness: There is no abdominal tenderness.  Musculoskeletal:     Cervical back: Neck supple.  Lymphadenopathy:     Cervical: No cervical adenopathy.  Skin:    Capillary Refill: Capillary refill takes less than 2 seconds.     Coloration: Skin is not jaundiced or pale.  Neurological:     General: No focal deficit present.     Mental Status: She is alert and oriented to person, place, and time.  Psychiatric:        Behavior: Behavior normal.      UC Treatments / Results  Labs (all labs ordered are listed, but only abnormal results are displayed) Labs Reviewed  HCG, QUANTITATIVE, PREGNANCY  TSH  PROLACTIN  POCT URINALYSIS DIPSTICK, ED / UC  POC URINE PREG, ED    EKG   Radiology No results found.  Procedures Procedures (including critical care time)  Medications Ordered in UC Medications - No data to display  Initial Impression / Assessment and Plan / UC Course  I have reviewed the triage vital signs and the nursing notes.  Pertinent labs & imaging results that were available during my care of the patient were reviewed by me and considered in my medical decision making (see chart for details).        UPT negative today again. UA clear. Since she continues to have no menstrual cycle, serum pregnancy test is done along with  prolactin and TSH testing. Request made to help her find a PCP.  Final Clinical Impressions(s) / UC Diagnoses   Final diagnoses:  Secondary amenorrhea  Vomiting, unspecified vomiting type, unspecified whether nausea present     Discharge Instructions      Pregnancy test was negative on the urine.  The urinalysis was clear  We have drawn blood today to check a pregnancy test in the blood.  We are also checking a couple of hormone levels.  Staff should notify you if there is anything significantly abnormal  Please see your primary care about this vomiting in the next 1 to 2 weeks.     ED Prescriptions   None    PDMP not reviewed this encounter.   Barrett Henle, MD 08/20/22 1327    Barrett Henle, MD 08/20/22 [phone]

## 2022-08-20 NOTE — Discharge Instructions (Addendum)
Pregnancy test was negative on the urine.  The urinalysis was clear  We have drawn blood today to check a pregnancy test in the blood.  We are also checking a couple of hormone levels.  Staff should notify you if there is anything significantly abnormal  Please see your primary care about this vomiting in the next 1 to 2 weeks.

## 2022-08-20 NOTE — ED Triage Notes (Signed)
Patient is here for Vomiting and Abdominal pain x 1 week. Pt reports she missed her period, last one was November 2023.

## 2022-08-21 LAB — PROLACTIN: Prolactin: 4.2 ng/mL — ABNORMAL LOW (ref 4.8–33.4)

## 2022-09-14 ENCOUNTER — Ambulatory Visit (HOSPITAL_COMMUNITY)
Admission: EM | Admit: 2022-09-14 | Discharge: 2022-09-15 | Disposition: A | Discharge status: Home / Self Care | Payer: No Payment, Other | Attending: Registered Nurse | Admitting: Registered Nurse

## 2022-09-14 ENCOUNTER — Encounter (HOSPITAL_COMMUNITY): Payer: Self-pay | Admitting: Registered Nurse

## 2022-09-14 DIAGNOSIS — F1914 Other psychoactive substance abuse with psychoactive substance-induced mood disorder: Secondary | ICD-10-CM | POA: Insufficient documentation

## 2022-09-14 DIAGNOSIS — F10129 Alcohol abuse with intoxication, unspecified: Secondary | ICD-10-CM | POA: Insufficient documentation

## 2022-09-14 DIAGNOSIS — F419 Anxiety disorder, unspecified: Secondary | ICD-10-CM | POA: Insufficient documentation

## 2022-09-14 DIAGNOSIS — F1910 Other psychoactive substance abuse, uncomplicated: Secondary | ICD-10-CM | POA: Diagnosis present

## 2022-09-14 DIAGNOSIS — F1021 Alcohol dependence, in remission: Secondary | ICD-10-CM

## 2022-09-14 DIAGNOSIS — Z1152 Encounter for screening for COVID-19: Secondary | ICD-10-CM | POA: Insufficient documentation

## 2022-09-14 DIAGNOSIS — F1994 Other psychoactive substance use, unspecified with psychoactive substance-induced mood disorder: Secondary | ICD-10-CM | POA: Diagnosis present

## 2022-09-14 LAB — CBC WITH DIFFERENTIAL/PLATELET
Abs Immature Granulocytes: 0.02 10*3/uL (ref 0.00–0.07)
Basophils Absolute: 0.1 10*3/uL (ref 0.0–0.1)
Basophils Relative: 1 %
Eosinophils Absolute: 0.1 10*3/uL (ref 0.0–0.5)
Eosinophils Relative: 1 %
HCT: 39.2 % (ref 36.0–46.0)
Hemoglobin: 13.7 g/dL (ref 12.0–15.0)
Immature Granulocytes: 0 %
Lymphocytes Relative: 28 %
Lymphs Abs: 1.4 10*3/uL (ref 0.7–4.0)
MCH: 36.1 pg — ABNORMAL HIGH (ref 26.0–34.0)
MCHC: 34.9 g/dL (ref 30.0–36.0)
MCV: 103.4 fL — ABNORMAL HIGH (ref 80.0–100.0)
Monocytes Absolute: 0.4 10*3/uL (ref 0.1–1.0)
Monocytes Relative: 8 %
Neutro Abs: 3 10*3/uL (ref 1.7–7.7)
Neutrophils Relative %: 62 %
Platelets: 236 10*3/uL (ref 150–400)
RBC: 3.79 MIL/uL — ABNORMAL LOW (ref 3.87–5.11)
RDW: 14.2 % (ref 11.5–15.5)
WBC: 4.9 10*3/uL (ref 4.0–10.5)
nRBC: 0 % (ref 0.0–0.2)

## 2022-09-14 LAB — URINALYSIS, ROUTINE W REFLEX MICROSCOPIC
Bilirubin Urine: NEGATIVE
Glucose, UA: NEGATIVE mg/dL
Ketones, ur: NEGATIVE mg/dL
Leukocytes,Ua: NEGATIVE
Nitrite: NEGATIVE
Protein, ur: 100 mg/dL — AB
Specific Gravity, Urine: >1.03 — ABNORMAL HIGH (ref 1.005–1.030)
pH: 6 (ref 5.0–8.0)

## 2022-09-14 LAB — LIPID PANEL
Cholesterol: 200 mg/dL (ref 0–200)
HDL: 108 mg/dL (ref >40)
LDL Cholesterol: 56 mg/dL (ref 0–99)
Total CHOL/HDL Ratio: 1.9 RATIO
Triglycerides: 178 mg/dL — ABNORMAL HIGH (ref <150)
VLDL: 36 mg/dL (ref 0–40)

## 2022-09-14 LAB — MAGNESIUM: Magnesium: 2.1 mg/dL (ref 1.7–2.4)

## 2022-09-14 LAB — POCT URINE DRUG SCREEN - MANUAL ENTRY (I-SCREEN)
POC Amphetamine UR: NOT DETECTED
POC Buprenorphine (BUP): NOT DETECTED
POC Cocaine UR: NOT DETECTED
POC Marijuana UR: POSITIVE — AB
POC Methadone UR: NOT DETECTED
POC Methamphetamine UR: NOT DETECTED
POC Morphine: NOT DETECTED
POC Oxazepam (BZO): POSITIVE — AB
POC Oxycodone UR: POSITIVE — AB
POC Secobarbital (BAR): NOT DETECTED

## 2022-09-14 LAB — URINALYSIS, MICROSCOPIC (REFLEX)

## 2022-09-14 LAB — COMPREHENSIVE METABOLIC PANEL
ALT: 48 U/L — ABNORMAL HIGH (ref 0–44)
AST: 41 U/L (ref 15–41)
Albumin: 3.8 g/dL (ref 3.5–5.0)
Alkaline Phosphatase: 52 U/L (ref 38–126)
Anion gap: 12 (ref 5–15)
BUN: 5 mg/dL — ABNORMAL LOW (ref 6–20)
CO2: 28 mmol/L (ref 22–32)
Calcium: 9.4 mg/dL (ref 8.9–10.3)
Chloride: 99 mmol/L (ref 98–111)
Creatinine, Ser: 0.76 mg/dL (ref 0.44–1.00)
GFR, Estimated: >60 mL/min (ref >60)
Glucose, Bld: 86 mg/dL (ref 70–99)
Potassium: 3.1 mmol/L — ABNORMAL LOW (ref 3.5–5.1)
Sodium: 139 mmol/L (ref 135–145)
Total Bilirubin: 0.3 mg/dL (ref 0.3–1.2)
Total Protein: 7.2 g/dL (ref 6.5–8.1)

## 2022-09-14 LAB — RESP PANEL BY RT-PCR (RSV, FLU A&B, COVID)  RVPGX2
Influenza A by PCR: NEGATIVE
Influenza B by PCR: NEGATIVE
Resp Syncytial Virus by PCR: NEGATIVE
SARS Coronavirus 2 by RT PCR: NEGATIVE

## 2022-09-14 LAB — POCT PREGNANCY, URINE: Preg Test, Ur: NEGATIVE

## 2022-09-14 LAB — ETHANOL: Alcohol, Ethyl (B): 178 mg/dL — ABNORMAL HIGH (ref <10)

## 2022-09-14 LAB — TSH: TSH: 0.302 u[IU]/mL — ABNORMAL LOW (ref 0.350–4.500)

## 2022-09-14 MED ORDER — HYDROXYZINE HCL 25 MG PO TABS
25.0000 mg | ORAL_TABLET | Freq: Four times a day (QID) | ORAL | Status: DC | PRN
  Administered 2022-09-14: 25 mg via ORAL
  Filled 2022-09-14: qty 1

## 2022-09-14 MED ORDER — ALUM & MAG HYDROXIDE-SIMETH 200-200-20 MG/5ML PO SUSP: 30.0000 mL | ORAL | Status: DC | PRN

## 2022-09-14 MED ORDER — ACETAMINOPHEN 325 MG PO TABS: 650.0000 mg | ORAL_TABLET | Freq: Four times a day (QID) | ORAL | Status: DC | PRN

## 2022-09-14 MED ORDER — THIAMINE MONONITRATE 100 MG PO TABS
100.0000 mg | ORAL_TABLET | Freq: Every day | ORAL | Status: DC
  Administered 2022-09-14 – 2022-09-15 (×2): 100 mg via ORAL
  Filled 2022-09-14 (×3): qty 1

## 2022-09-14 MED ORDER — ONDANSETRON 4 MG PO TBDP: 4.0000 mg | ORAL_TABLET | Freq: Four times a day (QID) | ORAL | Status: DC | PRN

## 2022-09-14 MED ORDER — CHLORDIAZEPOXIDE HCL 25 MG PO CAPS: 25.0000 mg | ORAL_CAPSULE | Freq: Four times a day (QID) | ORAL | Status: DC | PRN

## 2022-09-14 MED ORDER — MAGNESIUM HYDROXIDE 400 MG/5ML PO SUSP: 30.0000 mL | Freq: Every day | ORAL | Status: DC | PRN

## 2022-09-14 MED ORDER — TRAZODONE HCL 50 MG PO TABS
50.0000 mg | ORAL_TABLET | Freq: Every evening | ORAL | Status: DC | PRN
  Administered 2022-09-14: 50 mg via ORAL
  Filled 2022-09-14: qty 1

## 2022-09-14 MED ORDER — LOPERAMIDE HCL 2 MG PO CAPS: 2.0000 mg | ORAL_CAPSULE | ORAL | Status: DC | PRN

## 2022-09-14 MED ORDER — ADULT MULTIVITAMIN W/MINERALS CH
1.0000 | ORAL_TABLET | Freq: Every day | ORAL | Status: DC
  Administered 2022-09-14 – 2022-09-15 (×2): 1 via ORAL
  Filled 2022-09-14 (×2): qty 1

## 2022-09-14 NOTE — ED Provider Notes (Signed)
[facility] Urgent Care Continuous Assessment Admission H&P  Date: 09/14/22 Patient Name: Angela Christian MRN: [id number] Chief Complaint: alcohol detox  Diagnoses:  Final diagnoses:  Substance induced mood disorder ([place])  Polysubstance abuse ([place])  Alcohol intoxication in relapsed alcoholic [facility])    HPI: Angela Christian 27 y.o., female patient presented to [facility] as a walk in sent from [facility] seeking assistance with alcohol detox.       Patient seen face to face by this provider, consulted with Dr. Hampton Abbot; and chart reviewed on 09/14/22.  On evaluation Angela Christian reports she is currently living in a sober living facility but relapsed in around the holidays.  States she went to her case manager today as skin for help to get sobered up and was told that she had to go to a detox facility and then complete a 30-day program before she will be allowed back in her apartment.  Patient states she went to [facility] this morning but was not accepted related to having alcohol in her system.  Reports she drunk a couple of beers prior to going in this morning.  Patient denies suicidal/self-harm/homicidal ideation, psychosis, paranoia.  Patient gives permission to speak to her case manager Tanda Rockers at [employer]. During evaluation Angela Christian is standing with no noted distress.  She is alert/oriented x 4, calm, cooperative, attentive, and responses were relevant and appropriate to assessment questions.  She spoke in a clear tone at moderate volume, and normal pace, with good eye contact.   She denies suicidal/self-harm/homicidal ideation, psychosis, and paranoia.  Objectively:  there is no evidence of psychosis/mania or delusional thinking.  Patient does appear to be under the influence at this time.  She conversed coherently, with goal directed thoughts, and no distractibility, or pre-occupation and she has denied suicidal/self-harm/homicidal ideation, psychosis, and  paranoia.  Collateral Information: Mr. Gwenlyn Saran reports that he has spoke with intake at [facility] earlier this morning and patient had a bed available but when she arrived there instead of going directly to the facility she drank beer and was intoxicated when she went in.  Reports he was informed that if she came to [facility] see that she could be directly admitted for detox and she could leave here and go directly to [facility] 30-day program.  Mr. Gwenlyn Saran was informed that there were no open beds at our facility base crisis unit at this time but there is space on the continuous assessment unit.  Informed that patient is currently talking to [facility] doing an intake interview and will call back to let them know if patient was excepted.  Total Time spent with patient: 45 minutes  Musculoskeletal  Strength & Muscle Tone: within normal limits Gait & Station: normal, unable to stand Patient leans: N/A  Psychiatric Specialty Exam  Presentation General Appearance:  Appropriate for Environment; Casual  Eye Contact: Good  Speech: Clear and Coherent; Normal Rate  Speech Volume: Normal  Handedness: Right   Mood and Affect  Mood: Dysphoric  Affect: Congruent   Thought Process  Thought Processes: Coherent; Goal Directed  Descriptions of Associations:Intact  Orientation:Full (Time, Place and Person)  Thought Content:Logical  Diagnosis of Schizophrenia or Schizoaffective disorder in past: Yes   Hallucinations:Hallucinations: None  Ideas of Reference:None  Suicidal Thoughts:Suicidal Thoughts: No  Homicidal Thoughts:Homicidal Thoughts: No   Sensorium  Memory: Immediate Good; Recent Good; Remote Good  Judgment: Intact  Insight: Present   Executive Functions  Concentration: Good  Attention Span: Good  Recall: Good  Fund of Knowledge: Good  Language: Good   Psychomotor Activity  Psychomotor Activity: Psychomotor Activity: Normal   Assets  Assets: Communication  Skills; Desire for Improvement; Housing; Physical Health; Social Support   Sleep  Sleep: Sleep: Good   Nutritional Assessment (For OBS and FBC admissions only) Has the patient had a weight loss or gain of 10 pounds or more in the last 3 months?: No Has the patient had a decrease in food intake/or appetite?: No Does the patient have dental problems?: No Does the patient have eating habits or behaviors that may be indicators of an eating disorder including binging or inducing vomiting?: No Has the patient recently lost weight without trying?: 0 Has the patient been eating poorly because of a decreased appetite?: 0 Malnutrition Screening Tool Score: 0    Physical Exam Vitals and nursing note reviewed. Exam conducted with a chaperone present.  Constitutional:      General: She is not in acute distress.    Appearance: Normal appearance. She is not ill-appearing.  HENT:     Head: Normocephalic.  Eyes:     Conjunctiva/sclera: Conjunctivae normal.  Cardiovascular:     Rate and Rhythm: Normal rate.  Pulmonary:     Effort: Pulmonary effort is normal. No respiratory distress.  Musculoskeletal:        General: Normal range of motion.     Cervical back: Normal range of motion.  Skin:    General: Skin is warm and dry.  Neurological:     Mental Status: She is alert and oriented to person, place, and time.  Psychiatric:        Attention and Perception: Attention and perception normal. She does not perceive auditory or visual hallucinations.        Mood and Affect: Mood is depressed. Affect is tearful.        Speech: Speech normal.        Behavior: Behavior normal. Behavior is cooperative.        Thought Content: Thought content normal. Thought content is not paranoid or delusional. Thought content does not include homicidal or suicidal ideation.        Cognition and Memory: Cognition normal.        Judgment: Judgment normal.    Review of Systems  Psychiatric/Behavioral:  Negative  for hallucinations and suicidal ideas. Depression: Upset, sad related to situation. Substance abuse: Alcohol.The patient is not nervous/anxious and does not have insomnia.   All other systems reviewed and are negative.   Blood pressure 136/87, pulse 72, temperature 98 F (36.7 C), temperature source Oral, resp. rate 16, SpO2 100 %, unknown if currently breastfeeding. There is no height or weight on file to calculate BMI.  Past Psychiatric History: MDD, alcohol use disorder   Is the patient at risk to self? No  Has the patient been a risk to self in the past 6 months? No .    Has the patient been a risk to self within the distant past? No   Is the patient a risk to others? No   Has the patient been a risk to others in the past 6 months? No   Has the patient been a risk to others within the distant past? No   Past Medical History:  Past Medical History:  Diagnosis Date   Anxiety    Asthma    Bipolar disease, manic (HCC)    Cyclical vomiting syndrome    Depression  Dyspnea    Headache    Recurrent upper respiratory infection (URI)    Schizophrenia ([place])      Family History: None reported  Social History:  Social History   Socioeconomic History   Marital status: Single    Spouse name: Not on file   Number of children: Not on file   Years of education: Not on file   Highest education level: Not on file  Occupational History   Not on file  Tobacco Use   Smoking status: Every Day    Packs/day: 0.50    Types: Cigarettes   Smokeless tobacco: Never  Vaping Use   Vaping Use: Never used  Substance and Sexual Activity   Alcohol use: Yes    Alcohol/week: 14.0 standard drinks of alcohol    Types: 14 Standard drinks or equivalent per week    Comment: drinks 1-2 beers a day   Drug use: Yes    Types: Marijuana    Comment: 6.2.2022   Sexual activity: Yes    Birth control/protection: None  Other Topics Concern   Not on file  Social History Narrative   Not on file    Social Determinants of Health   Financial Resource Strain: Not on file  Food Insecurity: Not on file  Transportation Needs: Not on file  Physical Activity: Not on file  Stress: Not on file  Social Connections: Not on file  Intimate Partner Violence: Not on file     Last Labs:  Admission on 08/20/2022, Discharged on 08/20/2022  Component Date Value Ref Range Status   Glucose, UA 08/20/2022 NEGATIVE  NEGATIVE mg/dL Final   Bilirubin Urine 08/20/2022 NEGATIVE  NEGATIVE Final   Ketones, ur 08/20/2022 NEGATIVE  NEGATIVE mg/dL Final   Specific Gravity, Urine 08/20/2022 <=1.005  1.005 - 1.030 Final   Hgb urine dipstick 08/20/2022 NEGATIVE  NEGATIVE Final   pH 08/20/2022 6.0  5.0 - 8.0 Final   Protein, ur 08/20/2022 NEGATIVE  NEGATIVE mg/dL Final   Urobilinogen, UA 08/20/2022 0.2  0.0 - 1.0 mg/dL Final   Nitrite 08/20/2022 NEGATIVE  NEGATIVE Final   Leukocytes,Ua 08/20/2022 NEGATIVE  NEGATIVE Final   Biochemical Testing Only. Please order routine urinalysis from main lab if confirmatory testing is needed.   Preg Test, Ur 08/20/2022 NEGATIVE  NEGATIVE Final   Comment:        THE SENSITIVITY OF THIS METHODOLOGY IS >24 mIU/mL    hCG, Beta Chain, Quant, S 08/20/2022 1  <5 mIU/mL Final   Comment:          GEST. AGE      CONC.  (mIU/mL)   <=1 WEEK        5 - 50     2 WEEKS       50 - 500     3 WEEKS       100 - 10,000     4 WEEKS     1,000 - 30,000     5 WEEKS     3,500 - 115,000   6-8 WEEKS     12,000 - 270,000    12 WEEKS     15,000 - 220,000        FEMALE AND NON-PREGNANT FEMALE:     LESS THAN 5 mIU/mL Performed at [facility], [place] [address] [place], [place] [address]    TSH 08/20/2022 1.016  0.350 - 4.500 uIU/mL Final   Comment: Performed by a 3rd Generation assay with a functional sensitivity of <=  0.01 uIU/mL. Performed at [facility], [place] [address] [place], [place] [address]    Prolactin 08/20/2022 4.2 (L)  4.8 - 33.4 ng/mL Final   Comment:  (NOTE) Performed At: [facility] [place], [place] [id number] Rush Farmer MD [id number]   Admission on 08/12/2022, Discharged on 08/12/2022  Component Date Value Ref Range Status   Lipase 08/12/2022 <10 (L)  11 - 51 U/L Final   Performed at [facility], [address] [place], [place] [address]   Sodium 08/12/2022 133 (L)  135 - 145 mmol/L Final   Potassium 08/12/2022 3.6  3.5 - 5.1 mmol/L Final   Chloride 08/12/2022 94 (L)  98 - 111 mmol/L Final   CO2 08/12/2022 26  22 - 32 mmol/L Final   Glucose, Bld 08/12/2022 117 (H)  70 - 99 mg/dL Final   Glucose reference range applies only to samples taken after fasting for at least 8 hours.   BUN 08/12/2022 15  6 - 20 mg/dL Final   Creatinine, Ser 08/12/2022 1.06 (H)  0.44 - 1.00 mg/dL Final   Calcium 08/12/2022 11.4 (H)  8.9 - 10.3 mg/dL Final   Total Protein 08/12/2022 9.0 (H)  6.5 - 8.1 g/dL Final   Albumin 08/12/2022 5.2 (H)  3.5 - 5.0 g/dL Final   AST 08/12/2022 37  15 - 41 U/L Final   ALT 08/12/2022 29  0 - 44 U/L Final   Alkaline Phosphatase 08/12/2022 63  38 - 126 U/L Final   Total Bilirubin 08/12/2022 0.9  0.3 - 1.2 mg/dL Final   GFR, Estimated 08/12/2022 >60  >60 mL/min Final   Comment: (NOTE) Calculated using the CKD-EPI Creatinine Equation (2021)    Anion gap 08/12/2022 13  5 - 15 Final   Performed at [facility], [address] [place], [place] [address]   WBC 08/12/2022 8.0  4.0 - 10.5 K/uL Final   RBC 08/12/2022 4.44  3.87 - 5.11 MIL/uL Final   Hemoglobin 08/12/2022 16.0 (H)  12.0 - 15.0 g/dL Final   HCT 08/12/2022 46.3 (H)  36.0 - 46.0 % Final   MCV 08/12/2022 104.3 (H)  80.0 - 100.0 fL Final   MCH 08/12/2022 36.0 (H)  26.0 - 34.0 pg Final   MCHC 08/12/2022 34.6  30.0 - 36.0 g/dL Final   RDW 08/12/2022 16.0 (H)  11.5 - 15.5 % Final   Platelets 08/12/2022 242  150 - 400 K/uL Final   nRBC 08/12/2022 0.0  0.0 - 0.2 % Final   Performed at [facility], [address] [place], [place] [address]   Color, Urine 08/12/2022 YELLOW  YELLOW Final   APPearance 08/12/2022 CLEAR  CLEAR Final   Specific Gravity, Urine 08/12/2022 1.033 (H)  1.005 - 1.030 Final   pH 08/12/2022 5.5  5.0 - 8.0 Final   Glucose, UA 08/12/2022 NEGATIVE  NEGATIVE mg/dL Final   Hgb urine dipstick 08/12/2022 LARGE (A)  NEGATIVE Final   Bilirubin Urine 08/12/2022 SMALL (A)  NEGATIVE Final   Ketones, ur 08/12/2022 15 (A)  NEGATIVE mg/dL Final   Protein, ur 08/12/2022 30 (A)  NEGATIVE mg/dL Final   Nitrite 08/12/2022 NEGATIVE  NEGATIVE Final   Leukocytes,Ua 08/12/2022 NEGATIVE  NEGATIVE Final   RBC / HPF 08/12/2022 >50  0 - 5 RBC/hpf Final   WBC, UA 08/12/2022 6-10  0 - 5 WBC/hpf Final   Bacteria, UA 08/12/2022 NONE SEEN  NONE SEEN Final   Squamous Epithelial / HPF  08/12/2022 6-10  0 - 5 /HPF Final   Mucus 08/12/2022 PRESENT   Final   Hyaline Casts, UA 08/12/2022 PRESENT   Final   Performed at [facility], [address] [place], [place] [address]   Preg Test, Ur 08/12/2022 NEGATIVE  NEGATIVE Final   Comment:        THE SENSITIVITY OF THIS METHODOLOGY IS >20 mIU/mL. Performed at [facility], [address], [place], [place] [address]   Admission on 08/06/2022, Discharged on 08/06/2022  Component Date Value Ref Range Status   Preg Test, Ur 08/06/2022 NEGATIVE  NEGATIVE Final   Comment:        THE SENSITIVITY OF THIS METHODOLOGY IS >24 mIU/mL   Admission on 07/02/2022, Discharged on 07/02/2022  Component Date Value Ref Range Status   Lipase 07/02/2022 26  11 - 51 U/L Final   Performed at [facility] [address], [place], [place] [address]   Sodium 07/02/2022 135  135 - 145 mmol/L Final   Potassium 07/02/2022 3.9  3.5 - 5.1 mmol/L Final   Chloride 07/02/2022 92 (L)  98 - 111 mmol/L Final   CO2 07/02/2022 24  22 - 32 mmol/L Final   Glucose, Bld 07/02/2022 84  70 - 99 mg/dL Final   Glucose  reference range applies only to samples taken after fasting for at least 8 hours.   BUN 07/02/2022 17  6 - 20 mg/dL Final   Creatinine, Ser 07/02/2022 1.20 (H)  0.44 - 1.00 mg/dL Final   Calcium 07/02/2022 11.8 (H)  8.9 - 10.3 mg/dL Final   Total Protein 07/02/2022 8.8 (H)  6.5 - 8.1 g/dL Final   Albumin 07/02/2022 4.9  3.5 - 5.0 g/dL Final   AST 07/02/2022 38  15 - 41 U/L Final   ALT 07/02/2022 23  0 - 44 U/L Final   Alkaline Phosphatase 07/02/2022 65  38 - 126 U/L Final   Total Bilirubin 07/02/2022 2.0 (H)  0.3 - 1.2 mg/dL Final   GFR, Estimated 07/02/2022 >60  >60 mL/min Final   Comment: (NOTE) Calculated using the CKD-EPI Creatinine Equation (2021)    Anion gap 07/02/2022 19 (H)  5 - 15 Final   Performed at [facility] [address], [place], [place] [address]   WBC 07/02/2022 8.8  4.0 - 10.5 K/uL Final   RBC 07/02/2022 4.74  3.87 - 5.11 MIL/uL Final   Hemoglobin 07/02/2022 16.4 (H)  12.0 - 15.0 g/dL Final   HCT 07/02/2022 47.4 (H)  36.0 - 46.0 % Final   MCV 07/02/2022 100.0  80.0 - 100.0 fL Final   MCH 07/02/2022 34.6 (H)  26.0 - 34.0 pg Final   MCHC 07/02/2022 34.6  30.0 - 36.0 g/dL Final   RDW 07/02/2022 14.9  11.5 - 15.5 % Final   Platelets 07/02/2022 199  150 - 400 K/uL Final   nRBC 07/02/2022 0.0  0.0 - 0.2 % Final   Performed at [facility] [address] [place], [place] [address]   I-stat hCG, quantitative 07/02/2022 <5.0  <5 mIU/mL Final   Comment 3 07/02/2022          Final   Comment:   GEST. AGE      CONC.  (mIU/mL)   <=1 WEEK        5 - 50     2 WEEKS       50 - 500     3 WEEKS  100 - 10,000     4 WEEKS     1,000 - 30,000        FEMALE AND NON-PREGNANT FEMALE:     LESS THAN 5 mIU/mL   Admission on 04/23/2022, Discharged on 04/23/2022  Component Date Value Ref Range Status   Sodium 04/23/2022 142  135 - 145 mmol/L Final   Potassium 04/23/2022 4.0  3.5 - 5.1 mmol/L Final   Chloride 04/23/2022 105  98 - 111 mmol/L Final   CO2 04/23/2022 26   22 - 32 mmol/L Final   Glucose, Bld 04/23/2022 87  70 - 99 mg/dL Final   Glucose reference range applies only to samples taken after fasting for at least 8 hours.   BUN 04/23/2022 8  6 - 20 mg/dL Final   Creatinine, Ser 04/23/2022 0.96  0.44 - 1.00 mg/dL Final   Calcium 04/23/2022 9.4  8.9 - 10.3 mg/dL Final   Total Protein 04/23/2022 8.4 (H)  6.5 - 8.1 g/dL Final   Albumin 04/23/2022 4.6  3.5 - 5.0 g/dL Final   AST 04/23/2022 93 (H)  15 - 41 U/L Final   ALT 04/23/2022 50 (H)  0 - 44 U/L Final   Alkaline Phosphatase 04/23/2022 76  38 - 126 U/L Final   Total Bilirubin 04/23/2022 0.9  0.3 - 1.2 mg/dL Final   GFR, Estimated 04/23/2022 >60  >60 mL/min Final   Comment: (NOTE) Calculated using the CKD-EPI Creatinine Equation (2021)    Anion gap 04/23/2022 11  5 - 15 Final   Performed at [facility], [place] [address], [place], [place] [address]   Alcohol, Ethyl (B) 04/23/2022 247 (H)  <10 mg/dL Final   Comment: (NOTE) Lowest detectable limit for serum alcohol is 10 mg/dL.  For medical purposes only. Performed at [facility], [place] [address], [place], [place] [id number]    Salicylate Lvl A999333 <7.0 (L)  7.0 - 30.0 mg/dL Final   Performed at [facility] [address], [place], [place] [address]   Acetaminophen (Tylenol), Serum 04/23/2022 <10 (L)  10 - 30 ug/mL Final   Comment: (NOTE) Therapeutic concentrations vary significantly. A range of 10-30 ug/mL  may be an effective concentration for many patients. However, some  are best treated at concentrations outside of this range. Acetaminophen concentrations >150 ug/mL at 4 hours after ingestion  and >50 ug/mL at 12 hours after ingestion are often associated with  toxic reactions.  Performed at [facility], [place] [address], [place], [place] [address]    WBC 04/23/2022 5.7  4.0 - 10.5 K/uL Final   RBC 04/23/2022 4.47  3.87 - 5.11 MIL/uL Final   Hemoglobin  04/23/2022 15.0  12.0 - 15.0 g/dL Final   HCT 04/23/2022 45.2  36.0 - 46.0 % Final   MCV 04/23/2022 101.1 (H)  80.0 - 100.0 fL Final   MCH 04/23/2022 33.6  26.0 - 34.0 pg Final   MCHC 04/23/2022 33.2  30.0 - 36.0 g/dL Final   RDW 04/23/2022 14.9  11.5 - 15.5 % Final   Platelets 04/23/2022 262  150 - 400 K/uL Final   nRBC 04/23/2022 0.0  0.0 - 0.2 % Final   Performed at [facility], [place] [address] [place], [place] [address]   Opiates 04/23/2022 NONE DETECTED  NONE DETECTED Final   Cocaine 04/23/2022 NONE DETECTED  NONE DETECTED Final   Benzodiazepines 04/23/2022 NONE DETECTED  NONE DETECTED Final   Amphetamines 04/23/2022 NONE  DETECTED  NONE DETECTED Final   Tetrahydrocannabinol 04/23/2022 POSITIVE (A)  NONE DETECTED Final   Barbiturates 04/23/2022 NONE DETECTED  NONE DETECTED Final   Comment: (NOTE) DRUG SCREEN FOR MEDICAL PURPOSES ONLY.  IF CONFIRMATION IS NEEDED FOR ANY PURPOSE, NOTIFY LAB WITHIN 5 DAYS.  LOWEST DETECTABLE LIMITS FOR URINE DRUG SCREEN Drug Class                     Cutoff (ng/mL) Amphetamine and metabolites    1000 Barbiturate and metabolites    200 Benzodiazepine                 A999333 Tricyclics and metabolites     300 Opiates and metabolites        300 Cocaine and metabolites        300 THC                            50 Performed at [facility], [place] [address], [place], [place] [address]    I-stat hCG, quantitative 04/23/2022 <5.0  <5 mIU/mL Final   Comment 3 04/23/2022          Final   Comment:   GEST. AGE      CONC.  (mIU/mL)   <=1 WEEK        5 - 50     2 WEEKS       50 - 500     3 WEEKS       100 - 10,000     4 WEEKS     1,000 - 30,000        FEMALE AND NON-PREGNANT FEMALE:     LESS THAN 5 mIU/mL    Neisseria Gonorrhea 04/23/2022 Negative   Final   Chlamydia 04/23/2022 Negative   Final   Comment 04/23/2022 Normal Reference Ranger Chlamydia - Negative   Final   Comment 04/23/2022 Normal Reference Range  Neisseria Gonorrhea - Negative   Final    Allergies: Patient has no known allergies.  Medications:  PTA Medications  Medication Sig   albuterol (VENTOLIN HFA) 108 (90 Base) MCG/ACT inhaler Inhale 2 puffs into the lungs every 6 (six) hours as needed for wheezing or shortness of breath. (Patient not taking: Reported on 02/25/2022)   Prenatal Vit-Fe Fumarate-FA (PRENATAL MULTIVITAMIN) TABS tablet Take 1 tablet by mouth daily at 12 noon. (Patient not taking: Reported on 10/27/2021)   pantoprazole (PROTONIX) 40 MG tablet Take 40 mg by mouth as needed (acid reflux/ vomiting).   Naphazoline HCl (CLEAR EYES OP) Place 1-2 drops into both eyes daily as needed (dryness).   folic acid (FOLVITE) 1 MG tablet Take 1 tablet (1 mg total) by mouth daily.   thiamine (VITAMIN B-1) 100 MG tablet Take 1 tablet (100 mg total) by mouth daily.   diazepam (VALIUM) 10 MG tablet Take 1 tablet (10 mg total) by mouth daily as needed for anxiety.   chlordiazePOXIDE (LIBRIUM) 25 MG capsule '50mg'$  PO TID x 1D, then 25-'50mg'$  PO BID X 1D, then 25-'50mg'$  PO QD X 1D   OLANZapine (ZYPREXA) 5 MG tablet Take 0.5 tablets (2.5 mg total) by mouth at bedtime for 4 days, THEN 1 tablet (5 mg total) at bedtime thereafter.   naltrexone (DEPADE) 50 MG tablet Take 1 tablet (50 mg total) by mouth daily.   promethazine (PHENERGAN) 25 MG tablet Take 1 tablet (25 mg total) by mouth every 6 (six) hours as  needed for nausea or vomiting.   Facility Ordered Medications  Medication   acetaminophen (TYLENOL) tablet 650 mg   alum & mag hydroxide-simeth (MAALOX/MYLANTA) 200-200-20 MG/5ML suspension 30 mL   magnesium hydroxide (MILK OF MAGNESIA) suspension 30 mL   traZODone (DESYREL) tablet 50 mg   multivitamin with minerals tablet 1 tablet   chlordiazePOXIDE (LIBRIUM) capsule 25 mg   hydrOXYzine (ATARAX) tablet 25 mg   loperamide (IMODIUM) capsule 2-4 mg   ondansetron (ZOFRAN-ODT) disintegrating tablet 4 mg   [START ON 09/15/2022] thiamine (VITAMIN B1)  tablet 100 mg    Medical Decision Making  EMMAREE DAMPIER was admitted to [facility] continuous assessment unit for Substance induced mood disorder [facility]), crisis management, and stabilization. Routine labs ordered, which include Lab Orders         Resp panel by RT-PCR (RSV, Flu A&B, Covid) Anterior Nasal Swab         CBC with Differential/Platelet         Comprehensive metabolic panel         Hemoglobin A1c         Magnesium         Ethanol         Lipid panel         TSH         Prolactin         Urinalysis, Routine w reflex microscopic -Urine, Clean Catch         POC urine preg, ED         POCT Urine Drug Screen - (I-Screen)     Medication Management: Medications started Meds ordered this encounter  Medications   acetaminophen (TYLENOL) tablet 650 mg   alum & mag hydroxide-simeth (MAALOX/MYLANTA) 200-200-20 MG/5ML suspension 30 mL   magnesium hydroxide (MILK OF MAGNESIA) suspension 30 mL   traZODone (DESYREL) tablet 50 mg   multivitamin with minerals tablet 1 tablet   chlordiazePOXIDE (LIBRIUM) capsule 25 mg   hydrOXYzine (ATARAX) tablet 25 mg   loperamide (IMODIUM) capsule 2-4 mg   ondansetron (ZOFRAN-ODT) disintegrating tablet 4 mg   thiamine (VITAMIN B1) tablet 100 mg    Will maintain observation checks every 15 minutes for safety. [facility] accepted [facility] tomorrow morning for alcohol detox.     Recommendations  Based on my evaluation the patient appears to have an emergency medical condition for which I recommend the patient be transferred to the emergency department for further evaluation.    Nastacia Raybuck, NP 09/14/22  4:47 PM

## 2022-09-14 NOTE — ED Notes (Signed)
Pt a/o x 4. Denies SI/HI/AVH. Denies c/o withdrawal  symptoms. Flat affect, quiet, talking with peer. No noted distress. Will continue to monitor for safety

## 2022-09-14 NOTE — ED Notes (Signed)
Patient alert and oriented x 3. Denies SI/HI/AVH. Denies intent or plan to harm self or others. Routine conducted according to faculty protocol. Encourage patient to notify staff with any needs or concerns. Patient verbalized agreement and understanding. Will continue to monitor for safety. 

## 2022-09-14 NOTE — ED Notes (Addendum)
Patient was unable to void so ua ,preg test and drug test was not done . Covid test 15 min was unavailable so it was not done.

## 2022-09-14 NOTE — Care Management (Signed)
OBS Care Management   Writer spoke to [name] at [facility]  Writer faxed the intake packet to [phone].  The patient spoke to [name] and completed the intake phone assessment.   Per Hoyle Sauer, the patient has been accepted and can come to [facility] tomorrow at 10am.   Patient ha a Care Manager with [facility] [phone] and his mobile number is [phone].   Writer informed the NP [place] working with the patient.

## 2022-09-14 NOTE — Progress Notes (Signed)
   09/14/22 1543  [place] (Walk-ins at [facility] only)  What Is the Reason for Your Visit/Call Today? Pt states she wants detox. She recently was inpt at 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 Pt went home, used thc and etoh. She reports last use of etoh (2 wine coolers) was some time before arrival to [facility]. Pt had appt at [facility] this morning to enter into detox- she reports they would not accept her bc she smoked a blunt. Pt also reports [facility] noted she had PCP in her system. Pt is unaware when/how she took it. Anila denies SI, HI and AVH. Report past Bipolar dx. States she was supposed to start an rx for rapid heart rate but has not done so yet.  How Long Has This Been Causing You Problems? > than 6 months  Have You Recently Had Any Thoughts About Hurting Yourself? No  Are You Planning to Commit Suicide/Harm Yourself At This time? No  Have you Recently Had Thoughts About [place]? No  Are You Planning To Harm Someone At This Time? No  Are you currently experiencing any auditory, visual or other hallucinations? No  Have You Used Any Alcohol or Drugs in the Past 24 Hours? No  How long ago did you use Drugs or Alcohol? "few hours ago"  What Did You Use and How Much? 2 wine coolers prior to coming to [facility] taken today  Do you have any current medical co-morbidities that require immediate attention?  (pt states she is supposed to fill rx for rapid heart rate but has not yet)  Clinician description of patient physical appearance/behavior: Pt is eating during assessment. She is cooperative with slowed speech and vague responses  What Do You Feel Would Help You the Most Today? Alcohol or Drug Use Treatment  If access to [facility] Urgent Care was not available, would you have sought care in the Emergency Department? No  Determination of Need Routine (7 days)  Options For Referral  Chemical Dependency Intensive Outpatient Therapy (CDIOP);Medication Management;Outpatient Therapy    Flowsheet Row ED from 09/14/2022 in [facility] ED from 08/20/2022 in [facility] Urgent Care at [facility] ED from 08/12/2022 in [facility] Emergency Department at [place] No Risk No Risk No Risk

## 2022-09-14 NOTE — ED Notes (Signed)
Labs faxed to [facility].

## 2022-09-14 NOTE — ED Notes (Signed)
Pt admitted to obs ,denies SI/HI/AVH. Calm, cooperative throughout interview process. Skin assessment completed. Oriented to unit. Meal and drink offered. At [place], pt continue to denies SI/HI/AVH. Pt verbally contract for safety. Will monitor for safety. Belonging in locker.

## 2022-09-14 NOTE — ED Notes (Signed)
  Rn had to get another vitamin b1 tablet due to pill falling on floor.Marland Kitchen

## 2022-09-15 LAB — HEMOGLOBIN A1C
Hgb A1c MFr Bld: 4.6 % — ABNORMAL LOW (ref 4.8–5.6)
Mean Plasma Glucose: 85 mg/dL

## 2022-09-15 NOTE — Progress Notes (Signed)
Order received for pt discharge. Pt aware of pending transfer to [facility]. Patient belongings returned and pt denies any acute concerns. Ambulatory in no acute distress to safe transport to [facility].

## 2022-09-15 NOTE — Discharge Instructions (Signed)
Patient is instructed prior to discharge to:  Take all medications as prescribed by his/her mental healthcare provider. Report any adverse effects and or reactions from the medicines to his/her outpatient provider promptly. Keep all scheduled appointments, to ensure that you are getting refills on time and to avoid any interruption in your medication.  If you are unable to keep an appointment call to reschedule.  Be sure to follow-up with resources and follow-up appointments provided.  Patient has been instructed & cautioned: To not engage in alcohol and or illegal drug use while on prescription medicines. In the event of worsening symptoms, patient is instructed to call the crisis hotline, 911 and or go to the nearest ED for appropriate evaluation and treatment of symptoms. To follow-up with his/her primary care provider for your other medical issues, concerns and or health care needs.  Information: -National Suicide Prevention Lifeline 1-800-SUICIDE or 260-785-4642.  -988 offers 24/7 access to trained crisis counselors who can help people experiencing mental health-related distress. People can call or text 988 or chat 988lifeline.org for themselves or if they are worried about a loved one who may need crisis support.

## 2022-09-15 NOTE — ED Notes (Signed)
Pt was given a muffin, cereal, and milk for breakfast.

## 2022-09-15 NOTE — ED Notes (Signed)
Pt sleeping in recliner bed. RR even and unlabored. No noted distress. Will continue to monitor for safety

## 2022-09-15 NOTE — ED Provider Notes (Signed)
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  She is on board with residential substance use treatment but would prefer to remain in [place], [facility] is located in [place].  Patient is reassessed, face-to-face, by nurse practitioner.  Chart reviewed and patient discussed with Dr. Hampton Abbot.  She is seated in observation area upon my approach, no apparent distress.  She is alert and oriented, pleasant and cooperative during assessment.  She presents with euthymic mood, congruent affect.  Patient becomes tearful when to discussing relocating to [facility] for residential substance use treatment.  She reports she has been unable to work for several weeks related to her alcohol use.  She is concerned she will no longer be employed if she does not return to work.  She is insightful and feels that she would benefit more from residential substance use treatment and can find new employment later.  Angela Christian denies suicidal and homicidal ideations currently.  She easily contracts verbally for safety with this Probation officer.  She denies auditory and visual hallucinations.  There is no evidence of delusional thought content and no indication that patient is responding to internal stimuli.  She is not linked with outpatient psychiatry currently.  Diagnoses include anxiety, bipolar disorder and schizophrenia.  Reviewed options in [place]'s.  She  endorses several previous inpatient psychiatric admissions, most focused on her alcohol and substance use.  No family mental health or addiction history reported.  Patient offered support and encouragement.  She remains voluntary.  She plans to follow-up at residential treatment services after discharge.  Patient has been provided transportation to [facility].  Stay Summary: HPI completed on 09/14/2022- 1621pm: Angela Christian 27 y.o., female patient presented to [facility] as a walk in sent from [facility] seeking assistance with alcohol detox.         Patient seen face to face by this provider, consulted with Dr. Hampton Abbot; and chart reviewed on 09/14/22.  On evaluation Angela Christian reports she is currently living in a sober living facility but relapsed in around the holidays.  States she went to her case manager today as skin for help to get sobered up and was told that she had to go to a detox facility and then complete a 30-day program before she will be allowed back in her apartment.  Patient states she went to [facility] this morning but was not accepted related to having alcohol in her system.  Reports she drunk a couple of beers prior to going in this morning.  Patient denies suicidal/self-harm/homicidal ideation, psychosis, paranoia.  Patient gives permission to speak to her case manager Tanda Rockers at [employer]. During evaluation Angela Christian is standing with no noted distress.  She is alert/oriented x 4, calm, cooperative, attentive, and responses were relevant and appropriate to assessment questions.  She spoke in a clear tone at moderate volume, and  normal pace, with good eye contact.   She denies suicidal/self-harm/homicidal ideation, psychosis, and paranoia.  Objectively:  there is no evidence of psychosis/mania or delusional thinking.  Patient does appear to be under the influence at this time.  She conversed coherently, with goal directed thoughts, and no distractibility, or  pre-occupation and she has denied suicidal/self-harm/homicidal ideation, psychosis, and paranoia.   Collateral Information: Mr. Gwenlyn Saran reports that he has spoke with intake at [facility] earlier this morning and patient had a bed available but when she arrived there instead of going directly to the facility she drank beer and was intoxicated when she went in.  Reports he was informed that if she came to [facility] see that she could be directly admitted for detox and she could leave here and go directly to [facility] 30-day program.  Mr. Gwenlyn Saran was informed that there were no open beds at our facility base crisis unit at this time but there is space on the continuous assessment unit.  Informed that patient is currently talking to [facility] doing an intake interview and will call back to let them know if patient was excepted.  Total Time spent with patient: 30 minutes  Past Psychiatric History: see above Past Medical History:  Family History:  Family Psychiatric History: none reported Social History: alcohol use disorder, housing instability Tobacco Cessation:  A prescription for an FDA-approved tobacco cessation medication was offered at discharge and the patient refused  Current Medications:  Current Facility-Administered Medications  Medication Dose Route Frequency Provider Last Rate Last Admin   acetaminophen (TYLENOL) tablet 650 mg  650 mg Oral Q6H PRN Rankin, Shuvon B, NP       alum & mag hydroxide-simeth (MAALOX/MYLANTA) 200-200-20 MG/5ML suspension 30 mL  30 mL Oral Q4H PRN Rankin, Shuvon B, NP       chlordiazePOXIDE (LIBRIUM) capsule 25 mg  25 mg Oral Q6H PRN Rankin, Shuvon B, NP       hydrOXYzine (ATARAX) tablet 25 mg  25 mg Oral Q6H PRN Rankin, Shuvon B, NP   25 mg at 09/14/22 2116   loperamide (IMODIUM) capsule 2-4 mg  2-4 mg Oral PRN Rankin, Shuvon B, NP       magnesium hydroxide (MILK OF MAGNESIA) suspension 30 mL  30 mL Oral Daily PRN Rankin, Shuvon B, NP       multivitamin with minerals tablet 1  tablet  1 tablet Oral Daily Rankin, Shuvon B, NP   1 tablet at 09/14/22 1804   ondansetron (ZOFRAN-ODT) disintegrating tablet 4 mg  4 mg Oral Q6H PRN Rankin, Shuvon B, NP       thiamine (VITAMIN B1) tablet 100 mg  100 mg Oral Daily Rankin, Shuvon B, NP   100 mg at 09/14/22 1804   traZODone (DESYREL) tablet 50 mg  50 mg Oral QHS PRN Rankin, Shuvon B, NP   50 mg at 09/14/22 2117   Current Outpatient Medications  Medication Sig Dispense Refill   albuterol (VENTOLIN HFA) 108 (90 Base) MCG/ACT inhaler Inhale 2 puffs into the lungs every 6 (six) hours as needed for wheezing or shortness of breath. (Patient not taking: Reported on [id number]) 8 g 2   folic acid (FOLVITE) 1 MG tablet Take 1 tablet (1 mg total) by mouth daily. 30 tablet 1   naltrexone (DEPADE) 50 MG tablet Take 1 tablet (50 mg total) by mouth daily. 30 tablet 1   OLANZapine (ZYPREXA) 5 MG tablet Take 0.5 tablets (2.5 mg total) by mouth at bedtime for 4  days, THEN 1 tablet (5 mg total) at bedtime thereafter. 30 tablet 1   promethazine (PHENERGAN) 25 MG tablet Take 1 tablet (25 mg total) by mouth every 6 (six) hours as needed for nausea or vomiting. 10 tablet 0    PTA Medications:  Facility Ordered Medications  Medication   acetaminophen (TYLENOL) tablet 650 mg   alum & mag hydroxide-simeth (MAALOX/MYLANTA) 200-200-20 MG/5ML suspension 30 mL   magnesium hydroxide (MILK OF MAGNESIA) suspension 30 mL   traZODone (DESYREL) tablet 50 mg   multivitamin with minerals tablet 1 tablet   chlordiazePOXIDE (LIBRIUM) capsule 25 mg   hydrOXYzine (ATARAX) tablet 25 mg   loperamide (IMODIUM) capsule 2-4 mg   ondansetron (ZOFRAN-ODT) disintegrating tablet 4 mg   thiamine (VITAMIN B1) tablet 100 mg   PTA Medications  Medication Sig   albuterol (VENTOLIN HFA) 108 (90 Base) MCG/ACT inhaler Inhale 2 puffs into the lungs every 6 (six) hours as needed for wheezing or shortness of breath. (Patient not taking: Reported on [id number])   folic acid  (FOLVITE) 1 MG tablet Take 1 tablet (1 mg total) by mouth daily.   naltrexone (DEPADE) 50 MG tablet Take 1 tablet (50 mg total) by mouth daily.   promethazine (PHENERGAN) 25 MG tablet Take 1 tablet (25 mg total) by mouth every 6 (six) hours as needed for nausea or vomiting.       06/23/2022   11:22 AM  Depression screen PHQ 2/9  Decreased Interest 3  Down, Depressed, Hopeless 2  PHQ - 2 Score 5  Altered sleeping 3  Tired, decreased energy 2  Change in appetite 2  Feeling bad or failure about yourself  3  Trouble concentrating 2  Moving slowly or fidgety/restless 3  Suicidal thoughts 1  PHQ-9 Score 21  Difficult doing work/chores Very difficult    Flowsheet Row ED from 09/14/2022 in [facility] ED from 08/20/2022 in [facility] Urgent Care at [facility] ED from 08/12/2022 in [facility] Emergency Department at [place] No Risk No Risk No Risk       Musculoskeletal  Strength & Muscle Tone: within normal limits Gait & Station: normal Patient leans: N/A  Psychiatric Specialty Exam  Presentation  General Appearance:  Casual  Eye Contact: Good  Speech: Clear and Coherent; Normal Rate  Speech Volume: Normal  Handedness: Right   Mood and Affect  Mood: Irritable  Affect: Congruent   Thought Process  Thought Processes: Coherent; Goal Directed; Linear  Descriptions of Associations:Intact  Orientation:Full (Time, Place and Person)  Thought Content:WDL  Diagnosis of Schizophrenia or Schizoaffective disorder in past: Yes    Hallucinations:Hallucinations: None  Ideas of Reference:None  Suicidal Thoughts:Suicidal Thoughts: No  Homicidal Thoughts:Homicidal Thoughts: No   Sensorium  Memory: Immediate Good; Recent Good  Judgment: Intact  Insight: Fair   Community education officer  Concentration: Good  Attention Span: Good  Recall: Good  Fund of  Knowledge: Good  Language: Good   Psychomotor Activity  Psychomotor Activity: Psychomotor Activity: Normal   Assets  Assets: Communication Skills; Leisure Time; Physical Health; Resilience; Social Support   Sleep  Sleep: Sleep: Good   Nutritional Assessment (For OBS and FBC admissions only) Has the patient had a weight loss or gain of 10 pounds or more in the last 3 months?: No Has the patient had a decrease in food intake/or appetite?: No Does the patient have dental problems?: No Does the patient have eating habits or behaviors that may be indicators  of an eating disorder including binging or inducing vomiting?: No Has the patient recently lost weight without trying?: 0 Has the patient been eating poorly because of a decreased appetite?: 0 Malnutrition Screening Tool Score: 0    Physical Exam  Physical Exam Vitals and nursing note reviewed.  Constitutional:      Appearance: Normal appearance. She is well-developed and normal weight.  HENT:     Head: Normocephalic and atraumatic.     Nose: Nose normal.  Cardiovascular:     Rate and Rhythm: Normal rate.  Pulmonary:     Effort: Pulmonary effort is normal.  Musculoskeletal:        General: Normal range of motion.     Cervical back: Normal range of motion.  Skin:    General: Skin is warm and dry.  Neurological:     Mental Status: She is alert and oriented to person, place, and time.  Psychiatric:        Attention and Perception: Attention and perception normal.        Mood and Affect: Mood normal. Affect is angry.        Speech: Speech normal.        Behavior: Behavior normal. Behavior is cooperative.        Thought Content: Thought content normal.        Cognition and Memory: Cognition and memory normal.   Review of Systems  Constitutional: Negative.   HENT: Negative.    Eyes: Negative.   Respiratory: Negative.    Cardiovascular: Negative.   Gastrointestinal: Negative.   Genitourinary: Negative.    Musculoskeletal: Negative.   Skin: Negative.   Neurological: Negative.   Psychiatric/Behavioral:  Positive for substance abuse.    Blood pressure 125/76, pulse 71, temperature 98.1 F (36.7 C), temperature source Oral, resp. rate 17, SpO2 100 %, unknown if currently breastfeeding. There is no height or weight on file to calculate BMI.  Demographic Factors:  Low socioeconomic status  Loss Factors: Financial problems/change in socioeconomic status  Historical Factors: NA  Risk Reduction Factors:   Positive social support, Positive therapeutic relationship, and Positive coping skills or problem solving skills  Continued Clinical Symptoms:  Alcohol/Substance Abuse/Dependencies  Cognitive Features That Contribute To Risk:  None    Suicide Risk:  Minimal: No identifiable suicidal ideation.  Patients presenting with no risk factors but with morbid ruminations; may be classified as minimal risk based on the severity of the depressive symptoms  Plan Of Care/Follow-up recommendations:  Discharge to residential treatment at [facility]. Follow-up with outpatient psychiatry, resources provided. Continue current medications including: -Propranolol '20mg'$  twice daily -Sertraline '50mg'$  daily  Disposition: Discharge  Lucky Rathke, FNP 09/15/2022, 8:44 AM

## 2022-09-15 NOTE — ED Notes (Signed)
Pt sleeping at present, no distress noted.  Monitoring for safety. 

## 2022-09-17 LAB — PROLACTIN: Prolactin: 3 ng/mL — ABNORMAL LOW (ref 4.8–33.4)

## 2022-09-29 ENCOUNTER — Other Ambulatory Visit: Payer: Self-pay

## 2022-09-29 ENCOUNTER — Emergency Department (HOSPITAL_COMMUNITY)
Admission: EM | Admit: 2022-09-29 | Discharge: 2022-09-29 | Disposition: A | Discharge status: Home / Self Care | Payer: Self-pay | Attending: Emergency Medicine | Admitting: Emergency Medicine

## 2022-09-29 DIAGNOSIS — J45909 Unspecified asthma, uncomplicated: Secondary | ICD-10-CM | POA: Insufficient documentation

## 2022-09-29 DIAGNOSIS — R112 Nausea with vomiting, unspecified: Secondary | ICD-10-CM | POA: Insufficient documentation

## 2022-09-29 DIAGNOSIS — D72829 Elevated white blood cell count, unspecified: Secondary | ICD-10-CM | POA: Insufficient documentation

## 2022-09-29 LAB — COMPREHENSIVE METABOLIC PANEL
ALT: 23 U/L (ref 0–44)
AST: 25 U/L (ref 15–41)
Albumin: 4.7 g/dL (ref 3.5–5.0)
Alkaline Phosphatase: 43 U/L (ref 38–126)
Anion gap: 10 (ref 5–15)
BUN: 12 mg/dL (ref 6–20)
CO2: 20 mmol/L — ABNORMAL LOW (ref 22–32)
Calcium: 9.7 mg/dL (ref 8.9–10.3)
Chloride: 105 mmol/L (ref 98–111)
Creatinine, Ser: 0.99 mg/dL (ref 0.44–1.00)
GFR, Estimated: >60 mL/min (ref >60)
Glucose, Bld: 122 mg/dL — ABNORMAL HIGH (ref 70–99)
Potassium: 3.9 mmol/L (ref 3.5–5.1)
Sodium: 135 mmol/L (ref 135–145)
Total Bilirubin: 1.5 mg/dL — ABNORMAL HIGH (ref 0.3–1.2)
Total Protein: 8.2 g/dL — ABNORMAL HIGH (ref 6.5–8.1)

## 2022-09-29 LAB — URINALYSIS, ROUTINE W REFLEX MICROSCOPIC
Bacteria, UA: NONE SEEN
Bilirubin Urine: NEGATIVE
Glucose, UA: NEGATIVE mg/dL
Hgb urine dipstick: NEGATIVE
Ketones, ur: 80 mg/dL — AB
Leukocytes,Ua: NEGATIVE
Nitrite: NEGATIVE
Protein, ur: 100 mg/dL — AB
Specific Gravity, Urine: 1.028 (ref 1.005–1.030)
pH: 8 (ref 5.0–8.0)

## 2022-09-29 LAB — CBC WITH DIFFERENTIAL/PLATELET
Abs Immature Granulocytes: 0.04 10*3/uL (ref 0.00–0.07)
Basophils Absolute: 0 10*3/uL (ref 0.0–0.1)
Basophils Relative: 0 %
Eosinophils Absolute: 0 10*3/uL (ref 0.0–0.5)
Eosinophils Relative: 0 %
HCT: 43.8 % (ref 36.0–46.0)
Hemoglobin: 15.1 g/dL — ABNORMAL HIGH (ref 12.0–15.0)
Immature Granulocytes: 0 %
Lymphocytes Relative: 4 %
Lymphs Abs: 0.6 10*3/uL — ABNORMAL LOW (ref 0.7–4.0)
MCH: 35.1 pg — ABNORMAL HIGH (ref 26.0–34.0)
MCHC: 34.5 g/dL (ref 30.0–36.0)
MCV: 101.9 fL — ABNORMAL HIGH (ref 80.0–100.0)
Monocytes Absolute: 0.2 10*3/uL (ref 0.1–1.0)
Monocytes Relative: 2 %
Neutro Abs: 12.8 10*3/uL — ABNORMAL HIGH (ref 1.7–7.7)
Neutrophils Relative %: 94 %
Platelets: 323 10*3/uL (ref 150–400)
RBC: 4.3 MIL/uL (ref 3.87–5.11)
RDW: 13 % (ref 11.5–15.5)
WBC: 13.7 10*3/uL — ABNORMAL HIGH (ref 4.0–10.5)
nRBC: 0 % (ref 0.0–0.2)

## 2022-09-29 LAB — I-STAT BETA HCG BLOOD, ED (MC, WL, AP ONLY): I-stat hCG, quantitative: <5 m[IU]/mL (ref <5)

## 2022-09-29 LAB — MAGNESIUM: Magnesium: 1.8 mg/dL (ref 1.7–2.4)

## 2022-09-29 LAB — LIPASE, BLOOD: Lipase: 27 U/L (ref 11–51)

## 2022-09-29 MED ORDER — ONDANSETRON 4 MG PO TBDP: 4.0000 mg | ORAL_TABLET | Freq: Three times a day (TID) | ORAL | 0 refills | Status: DC | PRN

## 2022-09-29 MED ORDER — HALOPERIDOL LACTATE 5 MG/ML IJ SOLN
5.0000 mg | Freq: Once | INTRAMUSCULAR | Status: AC
  Administered 2022-09-29: 5 mg via INTRAVENOUS
  Filled 2022-09-29: qty 1

## 2022-09-29 MED ORDER — LACTATED RINGERS IV BOLUS
1500.0000 mL | Freq: Once | INTRAVENOUS | Status: AC
  Administered 2022-09-29: 1000 mL via INTRAVENOUS

## 2022-09-29 MED ORDER — DROPERIDOL 2.5 MG/ML IJ SOLN: 1.2500 mg | Freq: Once | INTRAMUSCULAR | Status: DC

## 2022-09-29 MED ORDER — CAPSAICIN 0.025 % EX CREA
TOPICAL_CREAM | CUTANEOUS | Status: AC
  Filled 2022-09-29: qty 60

## 2022-09-29 NOTE — Discharge Instructions (Addendum)
Hydrate, continue to abstain from alcohol or recreational drugs.  Zofran for nausea/vomiting as prescribed as needed.

## 2022-09-29 NOTE — ED Notes (Addendum)
Urine sample at triage nursing station. Pt was wheel chaired to restroom and back to room upon arrival.

## 2022-09-29 NOTE — ED Provider Notes (Signed)
[place] EMERGENCY DEPARTMENT AT [facility] Provider Note   CSN: [id number] Arrival date & time: 09/29/22  1353     History  Chief Complaint  Patient presents with   Emesis    Angela Christian is a 27 y.o. female.   Emesis Patient is a 27 year old female with past medical history significant for anxiety, schizophrenia, bipolar, headaches, cyclic vomiting syndrome, depression, asthma, C-section  Patient is presenting emergency room today with complaints of nausea vomiting and generalized abdominal pain.  She states that she was worried because her blood pressure was also measured high earlier.  She denies any urinary frequency urgency dysuria hematuria.  Denies any chest pain or difficulty breathing.  She states that she was at [facility] when her symptoms began.  She states she also feels lightheaded and short of breath and states that she also felt bad yesterday including a "funny feeling "in her stomach.  She informs me that whenever she is medically cleared she would like to return to [place]  She has a history of significant alcohol use but states that it has been "days or weeks" since her last alcohol intake     Home Medications Prior to Admission medications   Medication Sig Start Date End Date Taking? Authorizing Provider  ondansetron (ZOFRAN-ODT) 4 MG disintegrating tablet Take 1 tablet (4 mg total) by mouth every 8 (eight) hours as needed for nausea or vomiting. 09/29/22  Yes Dushaun Okey, Kathleene Hazel, PA  naphazoline-glycerin (CLEAR EYES REDNESS) 0.012-0.25 % SOLN Place 1-2 drops into both eyes 4 (four) times daily as needed for eye irritation.    [provider]  propranolol (INDERAL) 20 MG tablet Take 20 mg by mouth 2 (two) times daily. 09/11/22   [provider]  sertraline (ZOLOFT) 50 MG tablet Take 50 mg by mouth daily. 09/09/22 10/09/22  [provider]  omeprazole (PRILOSEC) 20 MG capsule Take 1 capsule (20 mg total) by mouth daily.  11/20/19 12/11/19  Darr, Edison Nasuti, PA-C      Allergies    Patient has no known allergies.    Review of Systems   Review of Systems  Gastrointestinal:  Positive for vomiting.    Physical Exam Updated Vital Signs BP 118/81 (BP Location: Right Arm)   Pulse 79   Temp 98 F (36.7 C) (Oral)   Resp 16   Wt 48 kg   SpO2 97%   BMI 18.16 kg/m  Physical Exam Vitals and nursing note reviewed.  Constitutional:      General: She is not in acute distress. HENT:     Head: Normocephalic and atraumatic.     Nose: Nose normal.     Mouth/Throat:     Mouth: Mucous membranes are dry.  Eyes:     General: No scleral icterus. Cardiovascular:     Rate and Rhythm: Normal rate and regular rhythm.     Pulses: Normal pulses.     Heart sounds: Normal heart sounds.  Pulmonary:     Effort: Pulmonary effort is normal. No respiratory distress.     Breath sounds: No wheezing.  Abdominal:     Palpations: Abdomen is soft.     Tenderness: There is no abdominal tenderness.  Musculoskeletal:     Cervical back: Normal range of motion.     Right lower leg: No edema.     Left lower leg: No edema.  Skin:    General: Skin is warm and dry.     Capillary Refill: Capillary refill takes  less than 2 seconds.  Neurological:     Mental Status: She is alert. Mental status is at baseline.  Psychiatric:        Mood and Affect: Mood normal.        Behavior: Behavior normal.     Comments: Alert and oriented x 4  Euthymic     ED Results / Procedures / Treatments   Labs (all labs ordered are listed, but only abnormal results are displayed) Labs Reviewed  COMPREHENSIVE METABOLIC PANEL - Abnormal; Notable for the following components:      Result Value   CO2 20 (*)    Glucose, Bld 122 (*)    Total Protein 8.2 (*)    Total Bilirubin 1.5 (*)    All other components within normal limits  CBC WITH DIFFERENTIAL/PLATELET - Abnormal; Notable for the following components:   WBC 13.7 (*)    Hemoglobin 15.1 (*)    MCV  101.9 (*)    MCH 35.1 (*)    Neutro Abs 12.8 (*)    Lymphs Abs 0.6 (*)    All other components within normal limits  URINALYSIS, ROUTINE W REFLEX MICROSCOPIC - Abnormal; Notable for the following components:   Ketones, ur 80 (*)    Protein, ur 100 (*)    All other components within normal limits  LIPASE, BLOOD  MAGNESIUM  I-STAT BETA HCG BLOOD, ED ([facility], [facility], [facility] ONLY)    EKG EKG Interpretation  Date/Time:  Wednesday September 29 2022 16:21:19 EDT Ventricular Rate:  103 PR Interval:  116 QRS Duration: 99 QT Interval:  370 QTC Calculation: 485 R Axis:   79 Text Interpretation: Sinus tachycardia Biatrial enlargement RSR' in V1 or V2, right VCD or RVH Borderline prolonged QT interval Confirmed by Octaviano Glow [phone]) on 09/29/2022 4:23:43 PM  Radiology No results found.  Procedures Procedures    Medications Ordered in ED Medications  capsaicin (ZOSTRIX) 0.025 % cream ( Topical Given 09/29/22 1626)  lactated ringers bolus 1,500 mL (0 mLs Intravenous Stopped 09/29/22 1838)  haloperidol lactate (HALDOL) injection 5 mg (5 mg Intravenous Given 09/29/22 1740)    ED Course/ Medical Decision Making/ A&P                             Medical Decision Making Amount and/or Complexity of Data Reviewed Labs: ordered.  Risk OTC drugs. Prescription drug management.   This patient presents to the ED for concern of abdominal pain, this involves a number of treatment options, and is a complaint that carries with it a high risk of complications and morbidity. A differential diagnosis was considered for the patient's symptoms which is discussed below:   The causes of generalized abdominal pain include but are not limited to AAA, mesenteric ischemia, appendicitis, diverticulitis, DKA, gastritis, gastroenteritis, AMI, nephrolithiasis, pancreatitis, peritonitis, adrenal insufficiency,lead poisoning, iron toxicity, intestinal ischemia, constipation, UTI,SBO/LBO, splenic rupture, biliary disease,  IBD, IBS, PUD, or hepatitis. Ectopic pregnancy, ovarian torsion, PID.    Co morbidities: Discussed in HPI   Brief History:  Patient is a 27 year old female with past medical history significant for anxiety, schizophrenia, bipolar, headaches, cyclic vomiting syndrome, depression, asthma, C-section  Patient is presenting emergency room today with complaints of nausea vomiting and generalized abdominal pain.  She states that she was worried because her blood pressure was also measured high earlier.  She denies any urinary frequency urgency dysuria hematuria.  Denies any chest pain or difficulty breathing.  She states  that she was at [facility] when her symptoms began.  She states she also feels lightheaded and short of breath and states that she also felt bad yesterday including a "funny feeling "in her stomach.  She informs me that whenever she is medically cleared she would like to return to [facility]  She has a history of significant alcohol use but states that it has been "days or weeks" since her last alcohol intake    EMR reviewed including pt PMHx, past surgical history and past visits to ER.   See HPI for more details   Lab Tests:   I ordered and independently interpreted labs. Labs notable for Elevation in hemoglobin and white blood cell count with platelets on the upper half of normal consistent with some hemoconcentration and dehydration.  CMP without significant abnormal finding apart from mild elevation in bilirubin.  No right upper quadrant abdominal tenderness low suspicion for cholecystitis or choledocholithiasis or similar.  No transaminitis.  Lipase within normal limits magnesium normal and i-STAT hCG negative for pregnancy.  Imaging Studies:  No imaging studies ordered for this patient    Cardiac Monitoring:  The patient was maintained on a cardiac monitor.  I personally viewed and interpreted the cardiac monitored which showed an underlying rhythm of: NSR EKG  non-ischemic borderline prolonged QT   Medicines ordered:  I ordered medication including Haldol 5 mg, LR 1.5 L, capsaicin applied abdomen for abdominal pain nausea vomiting Reevaluation of the patient after these medicines showed that the patient resolved I have reviewed the patients home medicines and have made adjustments as needed   Critical Interventions:     Consults/Attending Physician      Reevaluation:  After the interventions noted above I re-evaluated patient and found that they have :resolved   Social Determinants of Health:  The patient's social determinants of health were a factor in the care of this patient    Problem List / ED Course:  Nausea vomiting abdominal pain completely resolved after Haldol IV fluids and capsaicin applied abdomen.  On reassessment she is asymptomatic and requesting discharge so she can return to [facility]  RN was able to contact [facility] to provide transport.  She is tolerating p.o., ambulating and feels much improved.  No abdominal tenderness on my examination.  Will discharge him at this time.  Return precautions discussed   Dispostion:  After consideration of the diagnostic results and the patients response to treatment, I feel that the patent would benefit from outpatient follow-up.  Final Clinical Impression(s) / ED Diagnoses Final diagnoses:  Nausea and vomiting, unspecified vomiting type    Rx / DC Orders ED Discharge Orders          Ordered    ondansetron (ZOFRAN-ODT) 4 MG disintegrating tablet  Every 8 hours PRN        09/29/22 1929              Tedd Sias, [place] 09/30/22 [place], [place] K, DO 10/02/22 [phone]

## 2022-09-29 NOTE — ED Notes (Signed)
Pt was able to pass PO challenge, pt ambulatory to bathroom, pt was also able to call for ride to come get her. Advised pt up for d/c and can wait in [place] lobby until ride arrives. Pt verbalized understanding.

## 2022-09-29 NOTE — ED Provider Triage Note (Addendum)
Emergency Medicine Provider Triage Evaluation Note  Angela Christian , a 27 y.o. female  was evaluated in triage.  Pt complains of "feeling bad" yesterday. She states she was tired and "funny on my stomach" She states it has been "days or weeks" since last ETOH.  Today she has vomited many times. NBNB.  She feels LH and SOB.  No CP but does have abd pain.    Review of Systems  Positive: Abd pain, NV Negative: Fever   Physical Exam  BP 131/84 (BP Location: Right Arm)   Pulse 90   Temp 97.8 F (36.6 C) (Oral)   Resp 18   Wt 48 kg   SpO2 100%   BMI 18.16 kg/m  Gen:   Awake, no distress   Resp:  Normal effort  MSK:   Moves extremities without difficulty Other:    Medical Decision Making  Medically screening exam initiated at 2:23 PM.  Appropriate orders placed.  Angela Christian was informed that the remainder of the evaluation will be completed by another provider, this initial triage assessment does not replace that evaluation, and the importance of remaining in the ED until their evaluation is complete.  Labs, abd pain workup      Angela Christian, [place] 09/29/22 1426

## 2022-09-29 NOTE — ED Triage Notes (Addendum)
BIBA from [facility] for c/o sudden onset n/v/d, generalized abd pain, and hypertension. Hx cyclic vomiting.  Was at [facility] from 3/4-today and was in detox 2 weeks prior to that.  [id number] reports peaked T-waves and irregular on EKG. Ems admin '4mg'$  zofran

## 2022-09-29 NOTE — ED Notes (Signed)
Pt given water to drink for PO challange

## 2022-10-11 ENCOUNTER — Ambulatory Visit: Payer: Self-pay | Admitting: Physician Assistant

## 2022-10-11 ENCOUNTER — Encounter: Payer: Self-pay | Admitting: Physician Assistant

## 2022-10-11 VITALS — BP 148/111 | HR 118 | Ht 64.5 in | Wt 100.0 lb

## 2022-10-11 DIAGNOSIS — E0590 Thyrotoxicosis, unspecified without thyrotoxic crisis or storm: Secondary | ICD-10-CM

## 2022-10-11 DIAGNOSIS — F1010 Alcohol abuse, uncomplicated: Secondary | ICD-10-CM

## 2022-10-11 DIAGNOSIS — F1290 Cannabis use, unspecified, uncomplicated: Secondary | ICD-10-CM

## 2022-10-11 DIAGNOSIS — F411 Generalized anxiety disorder: Secondary | ICD-10-CM

## 2022-10-11 DIAGNOSIS — R1115 Cyclical vomiting syndrome unrelated to migraine: Secondary | ICD-10-CM

## 2022-10-11 DIAGNOSIS — F3170 Bipolar disorder, currently in remission, most recent episode unspecified: Secondary | ICD-10-CM

## 2022-10-11 DIAGNOSIS — F1210 Cannabis abuse, uncomplicated: Secondary | ICD-10-CM

## 2022-10-11 DIAGNOSIS — D539 Nutritional anemia, unspecified: Secondary | ICD-10-CM

## 2022-10-11 DIAGNOSIS — J302 Other seasonal allergic rhinitis: Secondary | ICD-10-CM

## 2022-10-11 DIAGNOSIS — F17210 Nicotine dependence, cigarettes, uncomplicated: Secondary | ICD-10-CM

## 2022-10-11 DIAGNOSIS — D72829 Elevated white blood cell count, unspecified: Secondary | ICD-10-CM

## 2022-10-11 DIAGNOSIS — F330 Major depressive disorder, recurrent, mild: Secondary | ICD-10-CM

## 2022-10-11 MED ORDER — ONDANSETRON HCL 4 MG PO TABS
4.0000 mg | ORAL_TABLET | Freq: Once | ORAL | Status: AC
  Administered 2022-10-11: 4 mg via ORAL

## 2022-10-11 MED ORDER — OMEPRAZOLE 20 MG PO CPDR: 20.0000 mg | DELAYED_RELEASE_CAPSULE | Freq: Every day | ORAL | 3 refills | Status: DC

## 2022-10-11 MED ORDER — NAPHAZOLINE-GLYCERIN 0.012-0.25 % OP SOLN: 1.0000 [drp] | Freq: Four times a day (QID) | OPHTHALMIC | 1 refills | Status: DC | PRN

## 2022-10-11 MED ORDER — SERTRALINE HCL 50 MG PO TABS: 50.0000 mg | ORAL_TABLET | Freq: Every day | ORAL | 1 refills | Status: AC

## 2022-10-11 MED ORDER — ONDANSETRON 8 MG PO TBDP: 8.0000 mg | ORAL_TABLET | Freq: Three times a day (TID) | ORAL | 1 refills | Status: DC | PRN

## 2022-10-11 MED ORDER — ARIPIPRAZOLE 5 MG PO TABS: 5.0000 mg | ORAL_TABLET | Freq: Every day | ORAL | 1 refills | Status: AC

## 2022-10-11 NOTE — Progress Notes (Unsigned)
New Patient Office Visit  Subjective    Patient ID: Angela Christian, female    DOB: 07-May-1996  Age: 27 y.o. MRN: [id number]  CC:  Chief Complaint  Patient presents with   Emesis    Pt states she has a vomiting disorder. She states she needs rest to allow her body to hold food down     HPI Angela Christian states that she is currently being treated for substance abuse at [facility] residential treatment center.  States that she arrived on March 4, states that she will be going back to her home in [place], states that she will be discharged on April 4.  States that she does go to [facility] for her primary care and does plan on returning to them after discharge.  States that she has been suffering from a vomiting disorder since she was 70.  States that it has not improved since detoxing and no longer using marijuana.  States that she has lost 7 pounds since being at [facility].  States that she uses Zofran with a small amount of relief.  States it is hard for her to keep any food down and constantly has stomach pain.  States last episode of vomiting was earlier today.  States sleep is good, is using melatonin and is sleeping approximately 5 to 6 hours.  Outpatient Encounter Medications as of 10/11/2022  Medication Sig   ARIPiprazole (ABILIFY) 5 MG tablet Take 1 tablet (5 mg total) by mouth daily.   omeprazole (PRILOSEC) 20 MG capsule Take 1 capsule (20 mg total) by mouth daily.   sertraline (ZOLOFT) 50 MG tablet Take 1 tablet (50 mg total) by mouth daily.   naphazoline-glycerin (CLEAR EYES REDNESS) 0.012-0.25 % SOLN Place 1-2 drops into both eyes 4 (four) times daily as needed for eye irritation.   ondansetron (ZOFRAN-ODT) 8 MG disintegrating tablet Take 1 tablet (8 mg total) by mouth every 8 (eight) hours as needed for nausea or vomiting.   [DISCONTINUED] naphazoline-glycerin (CLEAR EYES REDNESS) 0.012-0.25 % SOLN Place 1-2 drops into both eyes 4 (four) times daily as  needed for eye irritation.   [DISCONTINUED] omeprazole (PRILOSEC) 20 MG capsule Take 1 capsule (20 mg total) by mouth daily.   [DISCONTINUED] ondansetron (ZOFRAN-ODT) 4 MG disintegrating tablet Take 1 tablet (4 mg total) by mouth every 8 (eight) hours as needed for nausea or vomiting.   [DISCONTINUED] propranolol (INDERAL) 20 MG tablet Take 20 mg by mouth 2 (two) times daily.   [DISCONTINUED] sertraline (ZOLOFT) 50 MG tablet Take 50 mg by mouth daily.   [EXPIRED] ondansetron (ZOFRAN) tablet 4 mg    No facility-administered encounter medications on file as of 10/11/2022.    Past Medical History:  Diagnosis Date   Anxiety    Asthma    Bipolar disease, manic ([place])    Cyclical vomiting syndrome    Depression    Dyspnea    Headache    Recurrent upper respiratory infection (URI)    Schizophrenia ([place])     Past Surgical History:  Procedure Laterality Date   CESAREAN SECTION N/A 12/24/2020   Procedure: CESAREAN SECTION;  Surgeon: Waymon Amato, MD;  Location: [facility] LD ORS;  Service: Obstetrics;  Laterality: N/A;   TONSILLECTOMY      Family History  Problem Relation Age of Onset   Hypertension Mother    Seizures Maternal Uncle    Breast cancer Paternal Aunt    Kidney disease Maternal Grandmother     Social  History   Socioeconomic History   Marital status: Single    Spouse name: Not on file   Number of children: Not on file   Years of education: Not on file   Highest education level: Not on file  Occupational History   Not on file  Tobacco Use   Smoking status: Every Day    Packs/day: .5    Types: Cigarettes   Smokeless tobacco: Never  Vaping Use   Vaping Use: Never used  Substance and Sexual Activity   Alcohol use: Yes    Alcohol/week: 14.0 standard drinks of alcohol    Types: 14 Standard drinks or equivalent per week    Comment: drinks 1-2 beers a day   Drug use: Yes    Types: Marijuana    Comment: 6.2.2022   Sexual activity: Yes    Birth control/protection: None   Other Topics Concern   Not on file  Social History Narrative   Not on file   Social Determinants of Health   Financial Resource Strain: Not on file  Food Insecurity: Not on file  Transportation Needs: Not on file  Physical Activity: Not on file  Stress: Not on file  Social Connections: Not on file  Intimate Partner Violence: Not on file    Review of Systems  Constitutional:  Positive for weight loss. Negative for chills and fever.  HENT: Negative.    Eyes:  Positive for redness. Negative for pain and discharge.  Respiratory:  Negative for shortness of breath.   Cardiovascular:  Negative for chest pain.  Gastrointestinal:  Positive for abdominal pain, nausea and vomiting. Negative for constipation, diarrhea and heartburn.  Genitourinary:  Negative for dysuria, frequency and urgency.  Musculoskeletal: Negative.   Skin: Negative.   Neurological: Negative.   Endo/Heme/Allergies: Negative.   Psychiatric/Behavioral: Negative.          Objective    BP (!) 148/111 (BP Location: Left Arm, Patient Position: Sitting, Cuff Size: Normal)   Pulse (!) 118   Ht 5' 4.5" (1.638 m)   Wt 100 lb (45.4 kg)   LMP 10/04/2022   SpO2 98%   BMI 16.90 kg/m   Physical Exam Vitals and nursing note reviewed.  Constitutional:      Appearance: Normal appearance.  HENT:     Head: Normocephalic and atraumatic.     Right Ear: External ear normal.     Left Ear: External ear normal.     Nose: Nose normal.     Mouth/Throat:     Mouth: Mucous membranes are moist.     Pharynx: Oropharynx is clear.  Eyes:     Extraocular Movements: Extraocular movements intact.     Conjunctiva/sclera: Conjunctivae normal.     Pupils: Pupils are equal, round, and reactive to light.  Cardiovascular:     Rate and Rhythm: Normal rate and regular rhythm.     Pulses: Normal pulses.     Heart sounds: Normal heart sounds.  Pulmonary:     Effort: Pulmonary effort is normal.     Breath sounds: Normal breath sounds.   Abdominal:     General: Abdomen is flat. Bowel sounds are increased.     Palpations: Abdomen is soft.     Tenderness: There is generalized abdominal tenderness.  Musculoskeletal:        General: Normal range of motion.     Cervical back: Normal range of motion.  Skin:    General: Skin is warm and dry.  Neurological:  General: No focal deficit present.     Mental Status: She is oriented to person, place, and time.  Psychiatric:        Mood and Affect: Mood normal.        Behavior: Behavior normal.        Thought Content: Thought content normal.        Judgment: Judgment normal.       Assessment & Plan:   Problem List Items Addressed This Visit       Digestive   Cyclical vomiting syndrome   Relevant Medications   ondansetron (ZOFRAN-ODT) 8 MG disintegrating tablet   omeprazole (PRILOSEC) 20 MG capsule   Other Relevant Orders   Basic metabolic panel (Completed)     Endocrine   Subclinical hyperthyroidism   Relevant Orders   Thyroid Panel With TSH (Completed)     Other   Major depressive disorder, recurrent episode (HCC)   Relevant Medications   sertraline (ZOLOFT) 50 MG tablet   Other Relevant Orders   CBC with Differential/Platelet (Completed)   Cannabis use without complication   GAD (generalized anxiety disorder)   Relevant Medications   sertraline (ZOLOFT) 50 MG tablet   Bipolar affective disorder in remission ([place]) - Primary   Relevant Medications   ARIPiprazole (ABILIFY) 5 MG tablet   Alcohol abuse   Seasonal allergies   Relevant Medications   naphazoline-glycerin (CLEAR EYES REDNESS) 0.012-0.25 % SOLN   Macrocytic anemia   Relevant Orders   B12 and Folate Panel (Completed)   Other Visit Diagnoses     Leukocytosis, unspecified type          1. Bipolar affective disorder in remission [facility]) Continue current regimen.  Patient encouraged to continue follow-up with primary care provider upon discharge.  Patient understands and agrees.  Patient  encouraged to return to mobile unit as needed.  Red flags given for prompt reevaluation - ARIPiprazole (ABILIFY) 5 MG tablet; Take 1 tablet (5 mg total) by mouth daily.  Dispense: 30 tablet; Refill: 1  2. Mild episode of recurrent major depressive disorder (HCC) Continue current regimen - CBC with Differential/Platelet - sertraline (ZOLOFT) 50 MG tablet; Take 1 tablet (50 mg total) by mouth daily.  Dispense: 30 tablet; Refill: 1  3. GAD (generalized anxiety disorder)   4. Cyclical vomiting syndrome Increase Zofran, trial Prilosec.  Patient given additional dose of Zofran while in clinic. - ondansetron (ZOFRAN-ODT) 8 MG disintegrating tablet; Take 1 tablet (8 mg total) by mouth every 8 (eight) hours as needed for nausea or vomiting.  Dispense: 30 tablet; Refill: 1 - omeprazole (PRILOSEC) 20 MG capsule; Take 1 capsule (20 mg total) by mouth daily.  Dispense: 30 capsule; Refill: 3 - Basic metabolic panel - ondansetron (ZOFRAN) tablet 4 mg  5. Leukocytosis, unspecified type   6. Subclinical hyperthyroidism  - Thyroid Panel With TSH  7. Macrocytic anemia  - B12 and Folate Panel  8. Seasonal allergies Restart previous regimen - naphazoline-glycerin (CLEAR EYES REDNESS) 0.012-0.25 % SOLN; Place 1-2 drops into both eyes 4 (four) times daily as needed for eye irritation.  Dispense: 6 mL; Refill: 1  9. Alcohol abuse Currently in substance abuse treatment program  10. Marijuana abuse    I have reviewed the patient's medical history (PMH, PSH, Social History, Family History, Medications, and allergies) , and have been updated if relevant. I spent 30 minutes reviewing chart and  face to face time with patient.    Return if symptoms worsen or fail to improve.  Loraine Grip [place], PA-C

## 2022-10-11 NOTE — Patient Instructions (Signed)
To help with your abdominal pain and nausea, you are going to start taking Prilosec once daily, and increase your dose of Zofran 8 mg every 8 hours.  I restarted your eyedrops for you.  We will call you with today's lab results.  Kennieth Rad, PA-C Physician Assistant [facility] [email or web address]   Cyclic Vomiting Syndrome, Adult Cyclic vomiting syndrome (CVS) is a condition that causes episodes of severe nausea and vomiting. It can last for hours or even days. Attacks may occur several times a month or several times a year. Between episodes of CVS, you may be otherwise healthy. What are the causes? The cause of this condition is not known. Although many of the episodes can happen for no obvious reason, you may have specific CVS triggers. Episodes may be triggered by: An infection, especially colds and the flu. Emotional stress, including excitement or anxiety about finances, relationships, or moving. Certain foods or beverages, such as chocolate, cheese, alcohol, and food additives. Food allergies. Motion sickness. Eating a large meal before bed. Being very tired. Being overheated. Menstruation. Long-term cannabis use. What increases the risk? You are more likely to develop this condition if: You get migraine headaches. You have a family history of CVS or migraine headaches. What are the signs or symptoms? Symptoms tend to happen at the same time of day, and each episode tends to last about the same amount of time. Symptoms commonly start at night or when you wake up. Many people have warning signs (prodrome) before an episode, which may include slight nausea, sweating, and pale skin (pallor). The most common symptoms of a CVS attack include: Severe vomiting. Vomiting may happen every 5-15 minutes. Severe nausea. Gagging (retching). Other symptoms may include: Headache. Dizziness. Sensitivity to light or  sound. Abdominal pain. This can be severe. Loose stools or diarrhea. Weakness or exhaustion. Dehydration. This can cause: Thirst. Dry mouth. Decreased urination. Fatigue. How is this diagnosed? This condition may be diagnosed based on your symptoms, medical history, and family history of CVS or migraine. Your health care provider will ask whether you have had: Episodes of severe nausea and vomiting that have happened a total of 5 or more times, or 3 or more times in the past 6 months. Episodes that last for 1 hour or more, and occur 1 week apart or further apart. Episodes that are similar each time. Normal health between episodes. Your health care provider will also do a physical exam. To rule out other conditions, you may have tests, such as: Blood tests. Urine tests. Imaging tests. How is this treated? There is no cure for this condition, but treatment can help manage or prevent CVS episodes. Work with your health care provider to find the best treatment for you. Treatment may include: Avoiding stress and CVS triggers. Eating smaller, more frequent meals. Taking medicines, such as: Anti-nausea medicines. Antacids. Antidepressants. Antihistamines. Medicines for migraines. Over-the-counter pain medicine. Over-the counter diet supplements. Severe nausea and vomiting may require you to stay at the hospital. You may need IV fluids to prevent or treat dehydration. Follow these instructions at home: During an episode Take over-the-counter and prescription medicines only as told by your health care provider. Stay in bed and rest in a dark, quiet room. After an episode  Drink an oral rehydration solution (ORS), if directed by your health care provider. This is a drink that helps you replace fluids and the salts and minerals in your blood (electrolytes). It can be found at pharmacies and  retail stores. Drink small amounts of clear fluids slowly and gradually add more. Drink clear  fluids such as water or fruit juice that has water added (is diluted). You may also eat low-calorie popsicles. Avoid drinking fluids that contain a lot of sugar or caffeine, such as sports drinks and soda. Eat soft foods in small amounts every 3-4 hours. Eat your regular diet, but avoid spicy or fatty foods, such as french fries and pizza. General instructions Monitor your condition for any changes. Keep track of your attacks and symptoms, and pay attention to any triggers. Avoid those triggers when you can. If you use cannabis, stop using it right away. Ending cannabis use can reduce or even stop your cyclic vomiting syndrome. Keep all follow-up visits. This is important. Where to find more information Cyclic Vomiting Syndrome Association: cvsaonline.org Contact a health care provider if: Your condition gets worse. You cannot drink fluids without vomiting. You have pain and trouble swallowing after an episode. Get help right away if: You have blood in your vomit. Your vomit looks like coffee grounds. You have stools that are bloody or black, or stools that look like tar. You have signs of dehydration, such as: Sunken eyes. Not making tears while crying. Very dry mouth or cracked lips. Decreased urine production. Dark urine. Urine may be the color of tea. Weakness. Sleepiness. These symptoms may be an emergency. Get help right away. Call 911. Do not wait to see if the symptoms will go away. Do not drive yourself to the hospital. Summary Cyclic vomiting syndrome (CVS) causes episodes of severe nausea and vomiting that can last for hours or even days. Treatment can help you manage or prevent CVS episodes. Work with your health care provider to find the best treatment for you. Vomiting and diarrhea can make you feel weak and can lead to dehydration. If you notice signs of dehydration, call your health care provider right away. Keep all follow-up visits This is important. This  information is not intended to replace advice given to you by your health care provider. Make sure you discuss any questions you have with your health care provider. Document Revised: 02/11/2021 Document Reviewed: 02/11/2021 Elsevier Patient Education  [place].

## 2022-10-12 DIAGNOSIS — D539 Nutritional anemia, unspecified: Secondary | ICD-10-CM | POA: Insufficient documentation

## 2022-10-12 DIAGNOSIS — F3170 Bipolar disorder, currently in remission, most recent episode unspecified: Secondary | ICD-10-CM | POA: Insufficient documentation

## 2022-10-12 DIAGNOSIS — F1010 Alcohol abuse, uncomplicated: Secondary | ICD-10-CM | POA: Insufficient documentation

## 2022-10-12 DIAGNOSIS — J302 Other seasonal allergic rhinitis: Secondary | ICD-10-CM | POA: Insufficient documentation

## 2022-10-12 DIAGNOSIS — F411 Generalized anxiety disorder: Secondary | ICD-10-CM | POA: Insufficient documentation

## 2022-10-12 LAB — CBC WITH DIFFERENTIAL/PLATELET
Basophils Absolute: 0.1 x10E3/uL (ref 0.0–0.2)
Basos: 1 %
EOS (ABSOLUTE): 0 x10E3/uL (ref 0.0–0.4)
Eos: 0 %
Hematocrit: 48.4 % — ABNORMAL HIGH (ref 34.0–46.6)
Hemoglobin: 16.4 g/dL — ABNORMAL HIGH (ref 11.1–15.9)
Immature Grans (Abs): 0 x10E3/uL (ref 0.0–0.1)
Immature Granulocytes: 0 %
Lymphocytes Absolute: 1.5 x10E3/uL (ref 0.7–3.1)
Lymphs: 18 %
MCH: 35.3 pg — ABNORMAL HIGH (ref 26.6–33.0)
MCHC: 33.9 g/dL (ref 31.5–35.7)
MCV: 104 fL — ABNORMAL HIGH (ref 79–97)
Monocytes Absolute: 0.4 x10E3/uL (ref 0.1–0.9)
Monocytes: 4 %
Neutrophils Absolute: 6.5 x10E3/uL (ref 1.4–7.0)
Neutrophils: 77 %
Platelets: 316 x10E3/uL (ref 150–450)
RBC: 4.64 x10E6/uL (ref 3.77–5.28)
RDW: 13 % (ref 11.7–15.4)
WBC: 8.4 x10E3/uL (ref 3.4–10.8)

## 2022-10-12 LAB — BASIC METABOLIC PANEL
BUN/Creatinine Ratio: 11 (ref 9–23)
BUN: 11 mg/dL (ref 6–20)
CO2: 21 mmol/L (ref 20–29)
Calcium: 11.1 mg/dL — ABNORMAL HIGH (ref 8.7–10.2)
Chloride: 102 mmol/L (ref 96–106)
Creatinine, Ser: 0.99 mg/dL (ref 0.57–1.00)
Glucose: 92 mg/dL (ref 70–99)
Potassium: 4.3 mmol/L (ref 3.5–5.2)
Sodium: 145 mmol/L — ABNORMAL HIGH (ref 134–144)
eGFR: 80 mL/min/{1.73_m2} (ref >59)

## 2022-10-12 LAB — B12 AND FOLATE PANEL
Folate: 10.2 ng/mL (ref >3.0)
Vitamin B-12: 625 pg/mL (ref 232–1245)

## 2022-10-12 LAB — THYROID PANEL WITH TSH
Free Thyroxine Index: 1.7 (ref 1.2–4.9)
T3 Uptake Ratio: 28 % (ref 24–39)
T4, Total: 5.9 ug/dL (ref 4.5–12.0)
TSH: 0.64 u[IU]/mL (ref 0.450–4.500)

## 2022-12-10 ENCOUNTER — Encounter (HOSPITAL_COMMUNITY): Payer: Self-pay

## 2022-12-10 ENCOUNTER — Ambulatory Visit (HOSPITAL_COMMUNITY)
Admission: RE | Admit: 2022-12-10 | Discharge: 2022-12-10 | Disposition: A | Discharge status: Home / Self Care | Payer: Self-pay | Source: Ambulatory Visit | Attending: Emergency Medicine | Admitting: Emergency Medicine

## 2022-12-10 VITALS — BP 117/75 | HR 89 | Temp 98.3°F | Resp 16

## 2022-12-10 DIAGNOSIS — R112 Nausea with vomiting, unspecified: Secondary | ICD-10-CM

## 2022-12-10 LAB — POCT URINALYSIS DIP (MANUAL ENTRY)
Glucose, UA: NEGATIVE mg/dL
Leukocytes, UA: NEGATIVE
Nitrite, UA: NEGATIVE
Protein Ur, POC: 100 mg/dL — AB
Spec Grav, UA: >=1.03 — AB (ref 1.010–1.025)
Urobilinogen, UA: 1 E.U./dL
pH, UA: 6 (ref 5.0–8.0)

## 2022-12-10 LAB — POCT URINE PREGNANCY: Preg Test, Ur: NEGATIVE

## 2022-12-10 MED ORDER — METOCLOPRAMIDE HCL 5 MG/ML IJ SOLN
10.0000 mg | Freq: Once | INTRAMUSCULAR | Status: AC
  Administered 2022-12-10: 10 mg via INTRAMUSCULAR

## 2022-12-10 MED ORDER — METOCLOPRAMIDE HCL 10 MG PO TABS: 10.0000 mg | ORAL_TABLET | Freq: Two times a day (BID) | ORAL | 0 refills | Status: DC | PRN

## 2022-12-10 MED ORDER — METOCLOPRAMIDE HCL 5 MG/ML IJ SOLN
INTRAMUSCULAR | Status: AC
  Filled 2022-12-10: qty 2

## 2022-12-10 MED ORDER — CAPSAICIN 0.025 % EX CREA: TOPICAL_CREAM | Freq: Two times a day (BID) | CUTANEOUS | 0 refills | Status: DC

## 2022-12-10 NOTE — ED Notes (Signed)
Pt given cup water for fluid challenge

## 2022-12-10 NOTE — ED Triage Notes (Signed)
Pt states nausea and vomiting for the past week. Also having mid abdominal pain and constipation.

## 2022-12-10 NOTE — Discharge Instructions (Signed)
Your urine was negative for urinary tract infection, you are not pregnant.  It did show significant signs of dehydration, please encourage rehydration orally with water and electrolyte solution like Pedialyte.  I suggest a bland diet to help prevent stomach upset.  You can use the capsaicin cream, please wash your hands after a blood sugar use gloves.  You can also use the Reglan as needed.  We will contact you if anything results as critical to have you follow-up in the emergency department.  Please seek immediate care if you are unable to hold down food or fluids, have a seizure, loss of consciousness, or any new concerning symptoms.

## 2022-12-10 NOTE — ED Notes (Signed)
First attempt at drawing blood unsuccessful.  Upon second attempt pt states she does not want to be stuck again. [place] NP made aware.

## 2022-12-10 NOTE — ED Provider Notes (Addendum)
[facility]    CSN: [id number] Arrival date & time: 12/10/22  1126      History   Chief Complaint Chief Complaint  Patient presents with   Nausea    Entered by patient    HPI Angela Christian is a 27 y.o. female.    Patient presents to clinic for complaints of nausea and vomiting that have been ongoing for the past week.  She reports mid abdominal pain.  Last emesis was last night.  Was able to hold down clear liquids this morning.  Has been taking Zofran, reports this is not working.  Last smoked marijuana on the toilet this morning.  Last drink alcohol yesterday morning.  Is requesting STI screening, reports some vaginal irritation.  Denies any sores or lesions.  Denies dysuria or flank pain.  No fevers.  Past medical history significant for anxiety, schizophrenia, bipolar, headaches, cyclic vomiting syndrome, alcohol use disorder, THC substance abuse, depression, asthma, and a C-section.      The history is provided by the patient and medical records.    Past Medical History:  Diagnosis Date   Anxiety    Asthma    Bipolar disease, manic (HCC)    Cyclical vomiting syndrome    Depression    Dyspnea    Headache    Recurrent upper respiratory infection (URI)    Schizophrenia (HCC)     Patient Active Problem List   Diagnosis Date Noted   GAD (generalized anxiety disorder) 10/12/2022   Bipolar affective disorder in remission (HCC) 10/12/2022   Alcohol abuse 10/12/2022   Seasonal allergies 10/12/2022   Macrocytic anemia 10/12/2022   Polysubstance abuse (HCC) 09/14/2022   Substance induced mood disorder (HCC) 09/14/2022   Alcohol intoxication in relapsed alcoholic (HCC) 09/14/2022   Alcohol withdrawal syndrome (HCC) 02/25/2022   Seizure due to alcohol withdrawal (HCC) 02/25/2022   Substance abuse (HCC) 02/25/2022   Thrombocytopenia (HCC) 02/25/2022   Alcohol withdrawal (HCC) 02/25/2022   Subclinical hyperthyroidism 10/27/2021   Schizoaffective  disorder (HCC) 12/26/2020   Cesarean delivery, delivered, current hospitalization 12/26/2020   Encounter for induction of labor 12/26/2020   H/O anxiety disorder 12/26/2020   Acute blood loss anemia 12/26/2020   IDA (iron deficiency anemia) 12/25/2020   IUGR (intrauterine growth restriction) affecting care of mother 12/24/2020   Cannabis use without complication 12/24/2020   Postpartum care following cesarean delivery 12/24/2020   Major depressive disorder, recurrent episode (HCC) 09/10/2015   Cyclical vomiting syndrome 02/10/2015    Past Surgical History:  Procedure Laterality Date   CESAREAN SECTION N/A 12/24/2020   Procedure: CESAREAN SECTION;  Surgeon: Hoover Browns, MD;  Location: [facility];  Service: Obstetrics;  Laterality: N/A;   TONSILLECTOMY      OB History     Gravida  2   Para  2   Term  2   Preterm  0   AB  0   Living  2      SAB  0   IAB  0   Ectopic  0   Multiple  0   Live Births  2            Home Medications    Prior to Admission medications   Medication Sig Start Date End Date Taking? Authorizing Provider  capsaicin (ZOSTRIX) 0.025 % cream Apply topically 2 (two) times daily. 12/10/22  Yes Rinaldo Ratel, [place] N, FNP  metoCLOPramide (REGLAN) 10 MG tablet Take 1 tablet (10 mg total) by mouth 2 (  two) times daily as needed for up to 10 days for nausea. 12/10/22 12/20/22 Yes Rinaldo Ratel, [place] N, FNP  ARIPiprazole (ABILIFY) 5 MG tablet Take 1 tablet (5 mg total) by mouth daily. 10/11/22   Mayers, Cari S, PA-C  naphazoline-glycerin (CLEAR EYES REDNESS) 0.012-0.25 % SOLN Place 1-2 drops into both eyes 4 (four) times daily as needed for eye irritation. 10/11/22   Mayers, Cari S, PA-C  omeprazole (PRILOSEC) 20 MG capsule Take 1 capsule (20 mg total) by mouth daily. 10/11/22   Mayers, Cari S, PA-C  ondansetron (ZOFRAN-ODT) 8 MG disintegrating tablet Take 1 tablet (8 mg total) by mouth every 8 (eight) hours as needed for nausea or vomiting. 10/11/22   Mayers, Cari  S, PA-C  sertraline (ZOLOFT) 50 MG tablet Take 1 tablet (50 mg total) by mouth daily. 10/11/22   Mayers, Kasandra Knudsen, PA-C    Family History Family History  Problem Relation Age of Onset   Hypertension Mother    Seizures Maternal Uncle    Breast cancer Paternal Aunt    Kidney disease Maternal Grandmother     Social History Social History   Tobacco Use   Smoking status: Every Day    Packs/day: .5    Types: Cigarettes   Smokeless tobacco: Never  Vaping Use   Vaping Use: Never used  Substance Use Topics   Alcohol use: Yes    Alcohol/week: 14.0 standard drinks of alcohol    Types: 14 Standard drinks or equivalent per week    Comment: drinks 1-2 beers a day   Drug use: Yes    Types: Marijuana    Comment: 6.2.2022     Allergies   Patient has no known allergies.   Review of Systems Review of Systems  Respiratory:  Negative for cough and shortness of breath.   Cardiovascular:  Negative for chest pain.  Gastrointestinal:  Positive for abdominal pain, nausea and vomiting. Negative for diarrhea.     Physical Exam Triage Vital Signs ED Triage Vitals [12/10/22 1145]  Enc Vitals Group     BP 117/75     Pulse Rate 89     Resp 16     Temp 98.3 F (36.8 C)     Temp Source Oral     SpO2 99 %     Weight      Height      Head Circumference      Peak Flow      Pain Score 6     Pain Loc      Pain Edu?      Excl. in GC?    No data found.  Updated Vital Signs BP 117/75 (BP Location: Right Arm)   Pulse 89   Temp 98.3 F (36.8 C) (Oral)   Resp 16   LMP 11/06/2022 (Exact Date)   SpO2 99%   Visual Acuity Right Eye Distance:   Left Eye Distance:   Bilateral Distance:    Right Eye Near:   Left Eye Near:    Bilateral Near:     Physical Exam   UC Treatments / Results  Labs (all labs ordered are listed, but only abnormal results are displayed) Labs Reviewed  POCT URINALYSIS DIP (MANUAL ENTRY) - Abnormal; Notable for the following components:      Result Value    Bilirubin, UA moderate (*)    Ketones, POC UA trace (5) (*)    Spec Grav, UA >=1.030 (*)    Blood, UA small (*)  Protein Ur, POC =100 (*)    All other components within normal limits  CBC  BASIC METABOLIC PANEL  LIPASE, BLOOD  RPR  HIV ANTIBODY (ROUTINE TESTING W REFLEX)  POCT URINE PREGNANCY  CERVICOVAGINAL ANCILLARY ONLY    EKG   Radiology No results found.  Procedures Procedures (including critical care time)  Medications Ordered in UC Medications  metoCLOPramide (REGLAN) injection 10 mg (10 mg Intramuscular Given 12/10/22 1200)    Initial Impression / Assessment and Plan / UC Course  I have reviewed the triage vital signs and the nursing notes.  Pertinent labs & imaging results that were available during my care of the patient were reviewed by me and considered in my medical decision making (see chart for details).  Vitals and triage reviewed, patient is hemodynamically stable.  Abdomen is soft with left upper quadrant tenderness, active bowel sounds.  No palpable masses or hernia, without rebound or guarding, low concern for acute abdomen.  Reports failure of ODT Zofran at home, requesting Reglan.  IM Reglan administered.  Urinalysis with moderate bilirubin, trace ketones, high specific gravity, small red blood cells and protein.  Negative for nitrates or leukocytes, low concern for urinary tract infection. Does look like dehydration, consistent w/ recurrent emesis hx.   Cytology swab obtained to screen for sexually transmitted infections per patient request due to vaginal irritation.  On recheck, patient tolerating oral fluids.  Will send short prescription of Reglan, recent renal function was normal.  Trial capsaicin cream as well.  Given strict emergency room return precautions, patient verbalized understanding. Patient refused labs. No questions at this time.    Final Clinical Impressions(s) / UC Diagnoses   Final diagnoses:  Nausea and vomiting, unspecified  vomiting type     Discharge Instructions      Your urine was negative for urinary tract infection, you are not pregnant.  It did show significant signs of dehydration, please encourage rehydration orally with water and electrolyte solution like Pedialyte.  I suggest a bland diet to help prevent stomach upset.  You can use the capsaicin cream, please wash your hands after a blood sugar use gloves.  You can also use the Reglan as needed.  We will contact you if anything results as critical to have you follow-up in the emergency department.  Please seek immediate care if you are unable to hold down food or fluids, have a seizure, loss of consciousness, or any new concerning symptoms.      ED Prescriptions     Medication Sig Dispense Auth. Provider   capsaicin (ZOSTRIX) 0.025 % cream Apply topically 2 (two) times daily. 60 g Bostyn Kunkler, [place] N, [place]   metoCLOPramide (REGLAN) 10 MG tablet Take 1 tablet (10 mg total) by mouth 2 (two) times daily as needed for up to 10 days for nausea. 20 tablet Rexton Greulich, [place] N, [place]      PDMP not reviewed this encounter.   Uel Davidow, [place] N, FNP 12/10/22 1236    Chandel Zaun, [place] N, [place] 12/10/22 1248

## 2022-12-14 ENCOUNTER — Telehealth: Payer: Self-pay | Admitting: Emergency Medicine

## 2022-12-14 LAB — CERVICOVAGINAL ANCILLARY ONLY
Bacterial Vaginitis (gardnerella): POSITIVE — AB
Candida Glabrata: NEGATIVE
Candida Vaginitis: NEGATIVE
Chlamydia: NEGATIVE
Comment: NEGATIVE
Comment: NEGATIVE
Comment: NEGATIVE
Comment: NEGATIVE
Comment: NEGATIVE
Comment: NORMAL
Neisseria Gonorrhea: NEGATIVE
Trichomonas: NEGATIVE

## 2022-12-14 MED ORDER — METRONIDAZOLE 500 MG PO TABS: 500.0000 mg | ORAL_TABLET | Freq: Two times a day (BID) | ORAL | 0 refills | Status: DC

## 2023-03-02 ENCOUNTER — Ambulatory Visit (HOSPITAL_COMMUNITY): Payer: Self-pay

## 2023-03-03 ENCOUNTER — Ambulatory Visit (HOSPITAL_COMMUNITY): Payer: Self-pay

## 2023-03-04 ENCOUNTER — Encounter (HOSPITAL_COMMUNITY): Payer: Self-pay

## 2023-03-04 ENCOUNTER — Ambulatory Visit (HOSPITAL_COMMUNITY)
Admission: RE | Admit: 2023-03-04 | Discharge: 2023-03-04 | Disposition: A | Discharge status: Home / Self Care | Payer: Self-pay | Source: Ambulatory Visit | Attending: Nurse Practitioner | Admitting: Nurse Practitioner

## 2023-03-04 VITALS — BP 122/71 | HR 82 | Temp 98.3°F | Resp 16

## 2023-03-04 DIAGNOSIS — Z3202 Encounter for pregnancy test, result negative: Secondary | ICD-10-CM | POA: Insufficient documentation

## 2023-03-04 DIAGNOSIS — N898 Other specified noninflammatory disorders of vagina: Secondary | ICD-10-CM | POA: Insufficient documentation

## 2023-03-04 LAB — HIV ANTIBODY (ROUTINE TESTING W REFLEX): HIV Screen 4th Generation wRfx: NONREACTIVE

## 2023-03-04 LAB — POCT URINE PREGNANCY: Preg Test, Ur: NEGATIVE

## 2023-03-04 LAB — POCT URINALYSIS DIP (MANUAL ENTRY)
Glucose, UA: NEGATIVE mg/dL
Leukocytes, UA: NEGATIVE
Nitrite, UA: NEGATIVE
Protein Ur, POC: 100 mg/dL — AB
Spec Grav, UA: 1.025 (ref 1.010–1.025)
Urobilinogen, UA: 1 E.U./dL
pH, UA: 7 (ref 5.0–8.0)

## 2023-03-04 NOTE — ED Provider Notes (Signed)
[facility]    CSN: [id number] Arrival date & time: 03/04/23  1017      History   Chief Complaint Chief Complaint  Patient presents with   SEXUALLY TRANSMITTED DISEASE    Entered by patient    HPI GLENNETTE BROOK is a 27 y.o. female.   Patient presents today with 2 to 3-week history of vaginal irritation.  She denies vaginal rashes, sores, or lesions.  Reports it feels a little bit itchy down there, however no vaginal discharge or vaginal odor.  She is sexually active, no known exposures to STI, but she is requesting STI testing today.  No abdominal or pelvic pain.  Last menstrual period unknown.  No urinary symptoms today.    Past Medical History:  Diagnosis Date   Anxiety    Asthma    Bipolar disease, manic (HCC)    Cyclical vomiting syndrome    Depression    Dyspnea    Headache    Recurrent upper respiratory infection (URI)    Schizophrenia (HCC)     Patient Active Problem List   Diagnosis Date Noted   GAD (generalized anxiety disorder) 10/12/2022   Bipolar affective disorder in remission (HCC) 10/12/2022   Alcohol abuse 10/12/2022   Seasonal allergies 10/12/2022   Macrocytic anemia 10/12/2022   Polysubstance abuse (HCC) 09/14/2022   Substance induced mood disorder (HCC) 09/14/2022   Alcohol intoxication in relapsed alcoholic (HCC) 09/14/2022   Alcohol withdrawal syndrome (HCC) 02/25/2022   Seizure due to alcohol withdrawal (HCC) 02/25/2022   Substance abuse (HCC) 02/25/2022   Thrombocytopenia (HCC) 02/25/2022   Alcohol withdrawal (HCC) 02/25/2022   Subclinical hyperthyroidism 10/27/2021   Schizoaffective disorder (HCC) 12/26/2020   Cesarean delivery, delivered, current hospitalization 12/26/2020   Encounter for induction of labor 12/26/2020   H/O anxiety disorder 12/26/2020   Acute blood loss anemia 12/26/2020   IDA (iron deficiency anemia) 12/25/2020   IUGR (intrauterine growth restriction) affecting care of mother 12/24/2020   Cannabis  use without complication 12/24/2020   Postpartum care following cesarean delivery 12/24/2020   Major depressive disorder, recurrent episode (HCC) 09/10/2015   Cyclical vomiting syndrome 02/10/2015    Past Surgical History:  Procedure Laterality Date   CESAREAN SECTION N/A 12/24/2020   Procedure: CESAREAN SECTION;  Surgeon: Hoover Browns, MD;  Location: [facility];  Service: Obstetrics;  Laterality: N/A;   TONSILLECTOMY      OB History     Gravida  2   Para  2   Term  2   Preterm  0   AB  0   Living  2      SAB  0   IAB  0   Ectopic  0   Multiple  0   Live Births  2            Home Medications    Prior to Admission medications   Medication Sig Start Date End Date Taking? Authorizing Provider  ARIPiprazole (ABILIFY) 5 MG tablet Take 1 tablet (5 mg total) by mouth daily. 10/11/22   Mayers, Cari S, PA-C  capsaicin (ZOSTRIX) 0.025 % cream Apply topically 2 (two) times daily. 12/10/22   Garrison, [place] N, FNP  metoCLOPramide (REGLAN) 10 MG tablet Take 1 tablet (10 mg total) by mouth 2 (two) times daily as needed for up to 10 days for nausea. 12/10/22 12/20/22  Garrison, [place] N, FNP  naphazoline-glycerin (CLEAR EYES REDNESS) 0.012-0.25 % SOLN Place 1-2 drops into both eyes 4 (four) times  daily as needed for eye irritation. 10/11/22   Mayers, Cari S, PA-C  omeprazole (PRILOSEC) 20 MG capsule Take 1 capsule (20 mg total) by mouth daily. 10/11/22   Mayers, Cari S, PA-C  ondansetron (ZOFRAN-ODT) 8 MG disintegrating tablet Take 1 tablet (8 mg total) by mouth every 8 (eight) hours as needed for nausea or vomiting. 10/11/22   Mayers, Cari S, PA-C  sertraline (ZOLOFT) 50 MG tablet Take 1 tablet (50 mg total) by mouth daily. 10/11/22   Mayers, Kasandra Knudsen, PA-C    Family History Family History  Problem Relation Age of Onset   Hypertension Mother    Seizures Maternal Uncle    Breast cancer Paternal Aunt    Kidney disease Maternal Grandmother     Social History Social History    Tobacco Use   Smoking status: Every Day    Current packs/day: 0.50    Types: Cigarettes   Smokeless tobacco: Never  Vaping Use   Vaping status: Never Used  Substance Use Topics   Alcohol use: Yes    Alcohol/week: 14.0 standard drinks of alcohol    Types: 14 Standard drinks or equivalent per week    Comment: drinks 1-2 beers a day   Drug use: Yes    Types: Marijuana    Comment: 6.2.2022     Allergies   Patient has no known allergies.   Review of Systems Review of Systems Per HPI  Physical Exam Triage Vital Signs ED Triage Vitals [03/04/23 1047]  Encounter Vitals Group     BP 122/71     Systolic BP Percentile      Diastolic BP Percentile      Pulse Rate 82     Resp 16     Temp 98.3 F (36.8 C)     Temp Source Oral     SpO2 98 %     Weight      Height      Head Circumference      Peak Flow      Pain Score      Pain Loc      Pain Education      Exclude from Growth Chart    No data found.  Updated Vital Signs BP 122/71 (BP Location: Left Arm)   Pulse 82   Temp 98.3 F (36.8 C) (Oral)   Resp 16   LMP  (LMP Unknown)   SpO2 98%   Visual Acuity Right Eye Distance:   Left Eye Distance:   Bilateral Distance:    Right Eye Near:   Left Eye Near:    Bilateral Near:     Physical Exam Vitals and nursing note reviewed.  Constitutional:      General: She is not in acute distress.    Appearance: Normal appearance. She is not toxic-appearing.  Pulmonary:     Effort: Pulmonary effort is normal. No respiratory distress.  Genitourinary:    Comments: Deferred - self swab performed by patient Skin:    General: Skin is warm and dry.     Coloration: Skin is not jaundiced or pale.     Findings: No erythema.  Neurological:     Mental Status: She is alert and oriented to person, place, and time.     Motor: No weakness.     Gait: Gait normal.  Psychiatric:        Behavior: Behavior is cooperative.      UC Treatments / Results  Labs (all labs ordered  are listed, but  only abnormal results are displayed) Labs Reviewed  POCT URINALYSIS DIP (MANUAL ENTRY) - Abnormal; Notable for the following components:      Result Value   Color, UA orange (*)    Clarity, UA cloudy (*)    Bilirubin, UA small (*)    Ketones, POC UA small (15) (*)    Blood, UA moderate (*)    Protein Ur, POC =100 (*)    All other components within normal limits  RPR  HIV ANTIBODY (ROUTINE TESTING W REFLEX)  POCT URINE PREGNANCY  CERVICOVAGINAL ANCILLARY ONLY    EKG   Radiology No results found.  Procedures Procedures (including critical care time)  Medications Ordered in UC Medications - No data to display  Initial Impression / Assessment and Plan / UC Course  I have reviewed the triage vital signs and the nursing notes.  Pertinent labs & imaging results that were available during my care of the patient were reviewed by me and considered in my medical decision making (see chart for details).   Patient is well-appearing, normotensive, afebrile, not tachycardic, not tachypneic, oxygenating well on room air.    1. Vaginal irritation Cytology is pending. HIV, RPR pending Treat as indicated, no known exposures, treatment deferred today Urinalysis likely skewed secondary to bilirubin, color, stable overall for patient  2. Urine pregnancy test negative   The patient was given the opportunity to ask questions.  All questions answered to their satisfaction.  The patient is in agreement to this plan.    Final Clinical Impressions(s) / UC Diagnoses   Final diagnoses:  Vaginal irritation  Urine pregnancy test negative     Discharge Instructions      We will contact you if any of the testing from today comes back positive.  In the meantime, recommend condom use with every sexual encounter going forward.    ED Prescriptions   None    PDMP not reviewed this encounter.   Valentino Nose, NP 03/04/23 1124

## 2023-03-04 NOTE — Discharge Instructions (Signed)
We will contact you if any of the testing from today comes back positive.  In the meantime, recommend condom use with every sexual encounter going forward.

## 2023-03-04 NOTE — ED Triage Notes (Signed)
Pt reports vaginal irritation x 2-3 weeks. Pt would like sti-testing, Last menstrual cycle is unknown.

## 2023-03-05 LAB — RPR: RPR Ser Ql: NONREACTIVE

## 2023-03-07 ENCOUNTER — Telehealth: Payer: Self-pay | Admitting: Emergency Medicine

## 2023-03-07 LAB — CERVICOVAGINAL ANCILLARY ONLY
Bacterial Vaginitis (gardnerella): POSITIVE — AB
Chlamydia: NEGATIVE
Comment: NEGATIVE
Comment: NEGATIVE
Comment: NEGATIVE
Comment: NORMAL
Neisseria Gonorrhea: NEGATIVE
Trichomonas: NEGATIVE

## 2023-03-07 MED ORDER — METRONIDAZOLE 500 MG PO TABS
500.0000 mg | ORAL_TABLET | Freq: Two times a day (BID) | ORAL | 0 refills | Status: DC
Start: 2023-03-07 — End: 2023-04-05

## 2023-03-08 NOTE — Congregational Nurse Program (Signed)
  Dept: [phone]   Congregational Nurse Program Note  Date of Encounter: 03/08/2023  Clinic visit to check blood pressure, states she has not had any BP medication for several months and had anxiety issues today.  Had not taken medication today before clinic visit.  BP 143/93 after sitting at rest for 5 minutes, pulse 76 and regular.  Called PCP office and made appointment for Thursday at 11:30A, confirmed that she has transportation to get to the appointment. Educated regarding normal blood pressure levels and need to take medications if they are prescribed. Past Medical History: Past Medical History:  Diagnosis Date   Anxiety    Asthma    Bipolar disease, manic (HCC)    Cyclical vomiting syndrome    Depression    Dyspnea    Headache    Recurrent upper respiratory infection (URI)    Schizophrenia (HCC)     Encounter Details:  CNP Questionnaire - 03/08/23 1356       Questionnaire   Ask client: Do you give verbal consent for me to treat you today? 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.   Insurance Uninsured ([employer]/Self-Pay/Medicaid Family Planning)    Insurance/Financial Assistance Referral Medicaid    Medication N/A    Medical Provider Yes    Screening Referrals Made N/A    Medical Referrals Made [facility]    Medical Appointment Made [facility]    Recently w/o PCP, now 1st time PCP visit completed due to CNs referral or appointment made N/A    Food N/A    Transportation Need transportation assistance    Housing/Utilities N/A    Interpersonal Safety N/A    Interventions Advocate/Support;Navigate Healthcare System;Educate;Counsel;Case Management;Reviewed Medications    Abnormal to Normal Screening Since Last CN Visit N/A    Screenings CN Performed Blood Pressure;Weight;Pulse Ox    Sent  Client to Lab for: N/A    Did client attend any of the following based off CNs referral or appointments made? N/A    ED Visit Averted Yes    Life-Saving Intervention Made N/A

## 2023-04-05 ENCOUNTER — Encounter (HOSPITAL_COMMUNITY): Payer: Self-pay

## 2023-04-05 ENCOUNTER — Ambulatory Visit (HOSPITAL_COMMUNITY)
Admission: RE | Admit: 2023-04-05 | Discharge: 2023-04-05 | Disposition: A | Discharge status: Home / Self Care | Payer: MEDICAID | Source: Ambulatory Visit

## 2023-04-05 VITALS — BP 133/88 | HR 99 | Temp 98.7°F | Resp 16

## 2023-04-05 DIAGNOSIS — F10930 Alcohol use, unspecified with withdrawal, uncomplicated: Secondary | ICD-10-CM | POA: Diagnosis not present

## 2023-04-05 HISTORY — DX: Post-traumatic stress disorder, unspecified: F43.10

## 2023-04-05 MED ORDER — CHLORDIAZEPOXIDE HCL 25 MG PO CAPS: ORAL_CAPSULE | ORAL | 0 refills | Status: DC

## 2023-04-05 NOTE — ED Triage Notes (Signed)
Pt states she has really bad anxiety, she woke up this morning shaking. She states that she had a seizure in the past and when she woke up shaking she is worried she is going to have a seizure. She states she seen her PCP about a week ago and she just got a call that her potassium is low and her LFTs are elevated.   Her and her mom state her mental health isn't great and she has been spiraling the last couple days. She has an alcohol addiction snd the last time she drank was yesterday. She would like something for her BP and to be able to not drink again. She does have some mental health meds but they are old and she hasn't taken then consistently.    She states she doesn't know how much alcohol she drinks its usually beer but she has been drinking liquor the last week or so.

## 2023-04-05 NOTE — ED Provider Notes (Addendum)
[facility]    CSN: [id number] Arrival date & time: 04/05/23  1627      History   Chief Complaint Chief Complaint  Patient presents with   Anxiety    Entered by patient    HPI Angela Christian is a 27 y.o. female.   Patient presents with mother for severe anxiety. Patient is anxious about having a seizure from alcohol withdrawal due to having a seizure in the past while withdrawing from alcohol.  Patient reports history of excessive drinking and states that her last drink was last night. Denies active tremors, nausea, and headache. Denies suicidal ideation and homicidal ideation. History of generalized anxiety disorder, depression, bipolar affective disorder, PTSD, schizoaffective disorder, alcohol abuse, drug abuse, alcohol withdrawal.    Anxiety Pertinent negatives include no headaches.    Past Medical History:  Diagnosis Date   Anxiety    Asthma    Bipolar disease, manic (HCC)    Cyclical vomiting syndrome    Depression    Dyspnea    Headache    PTSD (post-traumatic stress disorder)    Recurrent upper respiratory infection (URI)    Schizophrenia (HCC)     Patient Active Problem List   Diagnosis Date Noted   GAD (generalized anxiety disorder) 10/12/2022   Bipolar affective disorder in remission (HCC) 10/12/2022   Alcohol abuse 10/12/2022   Seasonal allergies 10/12/2022   Macrocytic anemia 10/12/2022   Polysubstance abuse (HCC) 09/14/2022   Substance induced mood disorder (HCC) 09/14/2022   Alcohol intoxication in relapsed alcoholic (HCC) 09/14/2022   Alcohol withdrawal syndrome (HCC) 02/25/2022   Seizure due to alcohol withdrawal (HCC) 02/25/2022   Substance abuse (HCC) 02/25/2022   Thrombocytopenia (HCC) 02/25/2022   Alcohol withdrawal (HCC) 02/25/2022   Subclinical hyperthyroidism 10/27/2021   Schizoaffective disorder (HCC) 12/26/2020   Cesarean delivery, delivered, current hospitalization 12/26/2020   Encounter for induction of labor  12/26/2020   H/O anxiety disorder 12/26/2020   Acute blood loss anemia 12/26/2020   IDA (iron deficiency anemia) 12/25/2020   IUGR (intrauterine growth restriction) affecting care of mother 12/24/2020   Cannabis use without complication 12/24/2020   Postpartum care following cesarean delivery 12/24/2020   Major depressive disorder, recurrent episode (HCC) 09/10/2015   Cyclical vomiting syndrome 02/10/2015    Past Surgical History:  Procedure Laterality Date   CESAREAN SECTION N/A 12/24/2020   Procedure: CESAREAN SECTION;  Surgeon: Hoover Browns, MD;  Location: [facility];  Service: Obstetrics;  Laterality: N/A;   TONSILLECTOMY      OB History     Gravida  2   Para  2   Term  2   Preterm  0   AB  0   Living  2      SAB  0   IAB  0   Ectopic  0   Multiple  0   Live Births  2            Home Medications    Prior to Admission medications   Medication Sig Start Date End Date Taking? Authorizing Provider  ARIPiprazole (ABILIFY) 5 MG tablet Take 1 tablet (5 mg total) by mouth daily. 10/11/22  Yes Mayers, Cari S, PA-C  chlordiazePOXIDE (LIBRIUM) 25 MG capsule 50mg  PO TID x 1D, then 25-50mg  PO BID X 1D, then 25-50mg  PO QD X 1D 04/05/23  Yes Susann Givens, Remo Kirschenmann A, NP  hydrOXYzine (ATARAX) 25 MG tablet Take 25 mg by mouth 3 (three) times daily as needed.   Yes  [provider]  sertraline (ZOLOFT) 50 MG tablet Take 1 tablet (50 mg total) by mouth daily. 10/11/22  Yes Mayers, Cari S, PA-C    Family History Family History  Problem Relation Age of Onset   Hypertension Mother    Seizures Maternal Uncle    Breast cancer Paternal Aunt    Kidney disease Maternal Grandmother     Social History Social History   Tobacco Use   Smoking status: Every Day    Current packs/day: 0.50    Types: Cigarettes   Smokeless tobacco: Never  Vaping Use   Vaping status: Never Used  Substance Use Topics   Alcohol use: Yes    Alcohol/week: 14.0 standard drinks of alcohol     Types: 14 Standard drinks or equivalent per week    Comment: drinks 1-2 beers a day   Drug use: Yes    Types: Marijuana     Allergies   Patient has no known allergies.   Review of Systems Review of Systems  Constitutional:  Positive for activity change and fatigue.  Neurological:  Negative for dizziness, seizures, weakness, light-headedness, numbness and headaches.  Psychiatric/Behavioral:  Negative for agitation, behavioral problems, confusion, hallucinations, self-injury and suicidal ideas. The patient is nervous/anxious.      Physical Exam Triage Vital Signs ED Triage Vitals  Encounter Vitals Group     BP 04/05/23 1703 133/88     Systolic BP Percentile --      Diastolic BP Percentile --      Pulse Rate 04/05/23 1703 99     Resp 04/05/23 1703 16     Temp 04/05/23 1703 98.7 F (37.1 C)     Temp Source 04/05/23 1703 Oral     SpO2 04/05/23 1703 98 %     Weight --      Height --      Head Circumference --      Peak Flow --      Pain Score 04/05/23 1657 0     Pain Loc --      Pain Education --      Exclude from Growth Chart --    No data found.  Updated Vital Signs BP 133/88 (BP Location: Left Arm)   Pulse 99   Temp 98.7 F (37.1 C) (Oral)   Resp 16   LMP 03/29/2023 (Exact Date)   SpO2 98%   Visual Acuity Right Eye Distance:   Left Eye Distance:   Bilateral Distance:    Right Eye Near:   Left Eye Near:    Bilateral Near:     Physical Exam Vitals and nursing note reviewed.  Constitutional:      General: She is awake. She is not in acute distress.    Appearance: Normal appearance. She is well-developed. She is not ill-appearing, toxic-appearing or diaphoretic.  Neurological:     General: No focal deficit present.     Mental Status: She is alert and oriented to person, place, and time.     GCS: GCS eye subscore is 4. GCS verbal subscore is 5. GCS motor subscore is 6.  Psychiatric:        Attention and Perception: Attention and perception normal.         Mood and Affect: Mood is anxious. Affect is tearful.        Speech: Speech normal.        Behavior: Behavior normal.        Thought Content: Thought content normal.  Cognition and Memory: Cognition and memory normal.        Judgment: Judgment normal.      UC Treatments / Results  Labs (all labs ordered are listed, but only abnormal results are displayed) Labs Reviewed - No data to display  EKG   Radiology No results found.  Procedures Procedures (including critical care time)  Medications Ordered in UC Medications - No data to display  Initial Impression / Assessment and Plan / UC Course  I have reviewed the triage vital signs and the nursing notes.  Pertinent labs & imaging results that were available during my care of the patient were reviewed by me and considered in my medical decision making (see chart for details).     Patient presented with mother for severe anxiety.  Patient is anxious about having a seizure from alcohol withdrawal due to having a seizure in the past while withdrawing.  Reports history of excessive drinking and states that her last drink was last night.  Denies active tremors, nausea, dizziness, weakness, seizures, and headache.  Patient reports she went to rehab back in March and is requesting a prescription for medication to help with withdrawal symptoms and prevent her from drinking so she can function to go to work and take care of her kids. Denies suicidal ideation and homicidal ideation.  Extensive mental health history including generalized anxiety disorder, depression, bipolar affective disorder, PTSD, schizoaffective disorder, alcohol abuse, drug abuse, alcohol withdrawal.  Patient reports she is not consistent with taking her medications for mental health illnesses.  Patient states her PCP will not prescribe her any more medication for mental health illnesses until she is evaluated by behavioral health.  Patient also reports receiving lab  results today from PCP that revealed elevated LFTs and low potassium. Upon assessment patient is anxious and tearful. Vitals stable at this time. A&O x 3.  Attention, perception speech, behavior, thought content, cognition are intact. Recommended that patient seek treatment in the emergency department to help with initial alcohol withdrawal. Patient is very persistent about not wanting to go to the emergency department or behavioral health urgent care.  Upon discussing recommendations for her to be seen in the emergency department patient becomes more tearful and states that she just needs a few pills for a few days to get by so she can go to work tomorrow and not lose her job.  Patient adamant about not wanting to go back to rehab and have to leave her children. Patient and patient's mother state that her children are currently staying with her mother. Explained to patient that I was not comfortable prescribing a short course of medication for withdrawal due to history of seizures and hospitalization for alcohol withdrawal.  Recommended she seek medical treatment in the emergency department for acute alcohol withdrawal multiple times, but patient was adamantly refusing. Patient continues to deny suicidal ideation and homicidal ideation. Stating that she wants to be alive for her children. After going into further detail as to why I patient seeking treatment in the emergency department, patient and mother still refusing to go to the emergency department.  Patient, mother, and myself finally agreed on a plan to give her a short course of Librium to help with withdrawal symptoms and follow-up with behavioral health as soon as possible regarding further management of alcohol problem. Patient's mother agreeable to this plan and states that she will ensure that patient follows up with behavioral health first thing tomorrow.  Discussed follow-up, return, and strict emergency  department precautions. Final Clinical  Impressions(s) / UC Diagnoses   Final diagnoses:  Alcohol withdrawal syndrome without complication [facility])     Discharge Instructions      Please start taking Librium as prescribed over the next few days to help with withdrawal symptoms. If you develop tremors, excessive vomiting, hallucinations, or seizures please seek immediate medical treatment in the ER. Please follow-up with Behavioral Health regarding further management.     ED Prescriptions     Medication Sig Dispense Auth. Provider   chlordiazePOXIDE (LIBRIUM) 25 MG capsule 50mg  PO TID x 1D, then 25-50mg  PO BID X 1D, then 25-50mg  PO QD X 1D 10 capsule Wynonia Lawman A, NP      PDMP not reviewed this encounter.   Letta Kocher, NP 04/05/23 1805    Wynonia Lawman A, NP 04/05/23 1807

## 2023-04-05 NOTE — Discharge Instructions (Signed)
Please start taking Librium as prescribed over the next few days to help with withdrawal symptoms. If you develop tremors, excessive vomiting, hallucinations, or seizures please seek immediate medical treatment in the ER. Please follow-up with Behavioral Health regarding further management.

## 2023-04-09 ENCOUNTER — Ambulatory Visit (HOSPITAL_COMMUNITY): Payer: MEDICAID

## 2023-04-12 ENCOUNTER — Ambulatory Visit (HOSPITAL_COMMUNITY)
Admission: RE | Admit: 2023-04-12 | Discharge: 2023-04-12 | Disposition: A | Discharge status: Home / Self Care | Payer: MEDICAID | Source: Ambulatory Visit | Attending: Family Medicine | Admitting: Family Medicine

## 2023-04-12 ENCOUNTER — Encounter (HOSPITAL_COMMUNITY): Payer: Self-pay

## 2023-04-12 VITALS — BP 127/84 | HR 113 | Temp 98.3°F | Resp 18

## 2023-04-12 DIAGNOSIS — J069 Acute upper respiratory infection, unspecified: Secondary | ICD-10-CM | POA: Diagnosis present

## 2023-04-12 DIAGNOSIS — R112 Nausea with vomiting, unspecified: Secondary | ICD-10-CM | POA: Insufficient documentation

## 2023-04-12 DIAGNOSIS — J029 Acute pharyngitis, unspecified: Secondary | ICD-10-CM | POA: Insufficient documentation

## 2023-04-12 DIAGNOSIS — Z1152 Encounter for screening for COVID-19: Secondary | ICD-10-CM | POA: Insufficient documentation

## 2023-04-12 LAB — POCT RAPID STREP A (OFFICE): Rapid Strep A Screen: NEGATIVE

## 2023-04-12 MED ORDER — KETOROLAC TROMETHAMINE 30 MG/ML IJ SOLN
INTRAMUSCULAR | Status: AC
  Filled 2023-04-12: qty 1

## 2023-04-12 MED ORDER — ONDANSETRON 4 MG PO TBDP: 4.0000 mg | ORAL_TABLET | Freq: Three times a day (TID) | ORAL | 0 refills | Status: DC | PRN

## 2023-04-12 MED ORDER — ONDANSETRON 4 MG PO TBDP
ORAL_TABLET | ORAL | Status: AC
  Filled 2023-04-12: qty 1

## 2023-04-12 MED ORDER — ONDANSETRON 4 MG PO TBDP
4.0000 mg | ORAL_TABLET | Freq: Once | ORAL | Status: AC
  Administered 2023-04-12: 4 mg via ORAL

## 2023-04-12 MED ORDER — KETOROLAC TROMETHAMINE 30 MG/ML IJ SOLN
30.0000 mg | Freq: Once | INTRAMUSCULAR | Status: AC
  Administered 2023-04-12: 30 mg via INTRAMUSCULAR
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
## 2023-04-12 NOTE — Discharge Instructions (Addendum)
Your strep test is negative.  Culture of the throat will be sent, and staff will notify you if that is in turn positive.  You have been given a shot of Toradol 30 mg today.  Ondansetron dissolved in the mouth every 8 hours as needed for nausea or vomiting. Clear liquids(water, gatorade/pedialyte, ginger ale/sprite, chicken broth/soup) and bland things(crackers/toast, rice, potato, bananas) to eat. Avoid acidic foods like lemon/lime/orange/tomato, and avoid greasy/spicy foods.   You have been swabbed for COVID, and the test will result in the next 24 hours. Our staff will call you if positive. If the COVID test is positive, you should quarantine until you are fever free for 24 hours and you are starting to feel better, and then take added precautions for the next 5 days, such as physical distancing/wearing a mask and good hand hygiene/washing.  If you persist and vomiting and cannot keep fluids down, or any of your pain worsens, please proceed to the emergency room for evaluation and treatment that we cannot provide here in the urgent care setting.

## 2023-04-13 LAB — SARS CORONAVIRUS 2 (TAT 6-24 HRS): SARS Coronavirus 2: NEGATIVE

## 2023-04-14 LAB — CULTURE, GROUP A STREP (THRC)

## 2023-05-18 ENCOUNTER — Ambulatory Visit (HOSPITAL_COMMUNITY): Payer: MEDICAID

## 2023-05-18 ENCOUNTER — Ambulatory Visit (HOSPITAL_COMMUNITY)
Admission: RE | Admit: 2023-05-18 | Discharge: 2023-05-18 | Disposition: A | Discharge status: Home / Self Care | Payer: MEDICAID | Source: Ambulatory Visit | Attending: Family Medicine | Admitting: Family Medicine

## 2023-05-18 ENCOUNTER — Encounter (HOSPITAL_COMMUNITY): Payer: Self-pay

## 2023-05-18 VITALS — BP 124/96 | HR 88 | Temp 97.8°F | Resp 16

## 2023-05-18 DIAGNOSIS — R109 Unspecified abdominal pain: Secondary | ICD-10-CM

## 2023-05-18 DIAGNOSIS — R112 Nausea with vomiting, unspecified: Secondary | ICD-10-CM | POA: Diagnosis not present

## 2023-05-18 LAB — POCT URINALYSIS DIP (MANUAL ENTRY)
Glucose, UA: 100 mg/dL — AB
Leukocytes, UA: NEGATIVE
Nitrite, UA: NEGATIVE
Protein Ur, POC: >=300 mg/dL — AB
Spec Grav, UA: >=1.03 — AB (ref 1.010–1.025)
Urobilinogen, UA: 1 U/dL
pH, UA: 5.5 (ref 5.0–8.0)

## 2023-05-18 LAB — POCT URINE PREGNANCY: Preg Test, Ur: NEGATIVE

## 2023-05-18 MED ORDER — KETOROLAC TROMETHAMINE 30 MG/ML IJ SOLN
INTRAMUSCULAR | Status: AC
  Filled 2023-05-18: qty 1

## 2023-05-18 MED ORDER — METOCLOPRAMIDE HCL 5 MG/ML IJ SOLN
10.0000 mg | Freq: Once | INTRAMUSCULAR | Status: AC
  Administered 2023-05-18: 10 mg via INTRAMUSCULAR

## 2023-05-18 MED ORDER — KETOROLAC TROMETHAMINE 30 MG/ML IJ SOLN
30.0000 mg | Freq: Once | INTRAMUSCULAR | Status: AC
  Administered 2023-05-18: 30 mg via INTRAMUSCULAR

## 2023-05-18 MED ORDER — METOCLOPRAMIDE HCL 5 MG/ML IJ SOLN
INTRAMUSCULAR | Status: AC
Start: 2023-05-18 — End: ?
  Filled 2023-05-18: qty 2

## 2023-05-18 NOTE — Discharge Instructions (Addendum)
Please do your best to ensure adequate fluid intake in order to avoid dehydration. If you find that you are unable to tolerate drinking fluids regularly please proceed to the Emergency Department for evaluation.  Meds ordered this encounter  Medications   ketorolac (TORADOL) 30 MG/ML injection 30 mg   metoCLOPramide (REGLAN) injection 10 mg

## 2023-05-18 NOTE — ED Provider Notes (Signed)
[facility]   [id number] 05/18/23 Arrival Time: 1443  ASSESSMENT & PLAN:  1. Nausea and vomiting, unspecified vomiting type   2. Abdominal cramping    Denies IVF.  Meds ordered this encounter  Medications   ketorolac (TORADOL) 30 MG/ML injection 30 mg   metoCLOPramide (REGLAN) injection 10 mg    Will do her best to ensure adequate fluid intake in order to avoid dehydration. Will proceed to the Emergency Department for evaluation if unable to tolerate PO fluids regularly.  Otherwise she will f/u with her PCP or here if not showing improvement over the next 48-72 hours.  Reviewed expectations re: course of current medical issues. Questions answered. Outlined signs and symptoms indicating need for more acute intervention. Patient verbalized understanding. After Visit Summary given.   SUBJECTIVE: History from: patient.  Angela Christian is a 27 y.o. female who reports abdominal cramping and vomiting for the past 6 days. Cramping started after vomiting. H/O similar in past. "Just need a shot and I get better." States she is taking Zofran at home but it is not working for the cramping; does help with nausea.  Patient's last menstrual period was 05/16/2023 (approximate).   OBJECTIVE:  Vitals:   05/18/23 1452 05/18/23 1455  BP:  (!) 124/96  Pulse: 88   Resp: 16   Temp: 97.8 F (36.6 C)   TempSrc: Oral   SpO2: 98%     General appearance: alert; no distress Oropharynx: slightly dry Lungs: clear to auscultation bilaterally; unlabored Heart: regular rate and rhythm Abdomen: soft; non-distended; no significant abdominal tenderness; reports "cramping" feeling; bowel sounds present; no masses or organomegaly; no guarding or rebound tenderness Back: no CVA tenderness Extremities: no edema; symmetrical with no gross deformities Skin: warm; dry Neurologic: normal gait Psychological: alert and cooperative; normal mood and affect  Labs: Results for orders placed  or performed during the hospital encounter of 05/18/23  POCT urine pregnancy  Result Value Ref Range   Preg Test, Ur Negative Negative  POCT urinalysis dipstick  Result Value Ref Range   Color, UA orange (A) yellow   Clarity, UA cloudy (A) clear   Glucose, UA =100 (A) negative mg/dL   Bilirubin, UA large (A) negative   Ketones, POC UA small (15) (A) negative mg/dL   Spec Grav, UA >=6.295 (A) 1.010 - 1.025   Blood, UA large (A) negative   pH, UA 5.5 5.0 - 8.0   Protein Ur, POC >=300 (A) negative mg/dL   Urobilinogen, UA 1.0 0.2 or 1.0 E.U./dL   Nitrite, UA Negative Negative   Leukocytes, UA Negative Negative   Labs Reviewed  POCT URINALYSIS DIP (MANUAL ENTRY) - Abnormal; Notable for the following components:      Result Value   Color, UA orange (*)    Clarity, UA cloudy (*)    Glucose, UA =100 (*)    Bilirubin, UA large (*)    Ketones, POC UA small (15) (*)    Spec Grav, UA >=1.030 (*)    Blood, UA large (*)    Protein Ur, POC >=300 (*)    All other components within normal limits  POCT URINE PREGNANCY     No Known Allergies                                             Past Medical History:  Diagnosis Date  Anxiety    Asthma    Bipolar disease, manic (HCC)    Cyclical vomiting syndrome    Depression    Dyspnea    Headache    PTSD (post-traumatic stress disorder)    Recurrent upper respiratory infection (URI)    Schizophrenia (HCC)    Social History   Socioeconomic History   Marital status: Single    Spouse name: Not on file   Number of children: Not on file   Years of education: Not on file   Highest education level: Not on file  Occupational History   Not on file  Tobacco Use   Smoking status: Every Day    Current packs/day: 0.50    Types: Cigarettes   Smokeless tobacco: Never  Vaping Use   Vaping status: Never Used  Substance and Sexual Activity   Alcohol use: Yes    Alcohol/week: 14.0 standard drinks of alcohol    Types: 14 Standard drinks or  equivalent per week   Drug use: Yes    Types: Marijuana   Sexual activity: Yes    Birth control/protection: None  Other Topics Concern   Not on file  Social History Narrative   Not on file   Social Determinants of Health   Financial Resource Strain: Not on file  Food Insecurity: Low Risk  (09/07/2022)   [facility]   Hunger Vital Sign    Worried About Running Out of Food in the Last Year: Never true    Within the past 12 months, the food you bought just didn't last and you didn't have money to get more: Not on file  Transportation Needs: Unmet Transportation Needs (09/07/2022)   [facility]   Transportation    In the past 12 months, has lack of reliable transportation kept you from medical appointments, meetings, work or from getting things needed for daily living? : Yes  Physical Activity: Not on file  Stress: Not on file  Social Connections: Unknown (12/01/2021)   [facility]   Social Network    Social Network: Not on file  Intimate Partner Violence: Low Risk  (09/07/2022)   [facility] from [facility] visits prior to 09/18/2022., [facility] visits prior to 09/18/2022.   Safety    How often does anyone, including family and friends, physically hurt you?: Never    How often does anyone, including family and friends, insult or talk down to you?: Never    How often does anyone, including family and friends, threaten you with harm?: Never    How often does anyone, including family and friends, scream or curse at you?: Never   Family History  Problem Relation Age of Onset   Hypertension Mother    Seizures Maternal Uncle    Breast cancer Paternal Aunt    Kidney disease Maternal Dulce Sellar, MD 05/18/23 1810

## 2023-05-18 NOTE — ED Triage Notes (Signed)
Pt states abdominal pain and vomiting for the past 6 days.  States she is taking Zofran at home but it is not working.

## 2023-09-16 ENCOUNTER — Encounter (HOSPITAL_COMMUNITY): Payer: Self-pay

## 2023-09-16 ENCOUNTER — Ambulatory Visit (HOSPITAL_COMMUNITY)
Admission: RE | Admit: 2023-09-16 | Discharge: 2023-09-16 | Disposition: A | Discharge status: Home / Self Care | Payer: MEDICAID | Source: Ambulatory Visit | Attending: Family Medicine | Admitting: Family Medicine

## 2023-09-16 VITALS — BP 137/96 | HR 116 | Temp 97.9°F | Resp 18

## 2023-09-16 DIAGNOSIS — R1084 Generalized abdominal pain: Secondary | ICD-10-CM

## 2023-09-16 DIAGNOSIS — R112 Nausea with vomiting, unspecified: Secondary | ICD-10-CM

## 2023-09-16 MED ORDER — ONDANSETRON 4 MG PO TBDP: 4.0000 mg | ORAL_TABLET | Freq: Three times a day (TID) | ORAL | 0 refills | Status: AC | PRN

## 2023-09-16 MED ORDER — ONDANSETRON HCL 4 MG/2ML IJ SOLN
4.0000 mg | Freq: Once | INTRAMUSCULAR | Status: AC
  Administered 2023-09-16: 4 mg via INTRAMUSCULAR

## 2023-09-16 MED ORDER — ONDANSETRON HCL 4 MG/2ML IJ SOLN
INTRAMUSCULAR | Status: AC
Start: 2023-09-16 — End: ?
  Filled 2023-09-16: qty 2

## 2023-09-16 MED ORDER — KETOROLAC TROMETHAMINE 30 MG/ML IJ SOLN
30.0000 mg | Freq: Once | INTRAMUSCULAR | Status: AC
  Administered 2023-09-16: 30 mg via INTRAMUSCULAR

## 2023-09-16 MED ORDER — KETOROLAC TROMETHAMINE 30 MG/ML IJ SOLN
INTRAMUSCULAR | Status: AC
Start: 2023-09-16 — End: ?
  Filled 2023-09-16: qty 1

## 2023-09-16 NOTE — ED Provider Notes (Signed)
 [facility]    CSN: [id number] Arrival date & time: 09/16/23  1256      History   Chief Complaint Chief Complaint  Patient presents with   Appointment   Emesis   Chest Pain    HPI Angela Christian is a 28 y.o. female.    Emesis Chest Pain Associated symptoms: vomiting   Here for vomiting and abdominal pain.  Vomiting started 2 nights ago at least.  She comes in today because her entire abdomen is hurting. No fever or chills or URI symptoms.  NKDA  LMP began yesterday.   Past Medical History:  Diagnosis Date   Anxiety    Asthma    Bipolar disease, manic (HCC)    Cyclical vomiting syndrome    Depression    Dyspnea    Headache    PTSD (post-traumatic stress disorder)    Recurrent upper respiratory infection (URI)    Schizophrenia (HCC)     Patient Active Problem List   Diagnosis Date Noted   GAD (generalized anxiety disorder) 10/12/2022   Bipolar affective disorder in remission (HCC) 10/12/2022   Alcohol abuse 10/12/2022   Seasonal allergies 10/12/2022   Macrocytic anemia 10/12/2022   Polysubstance abuse (HCC) 09/14/2022   Substance induced mood disorder (HCC) 09/14/2022   Alcohol intoxication in relapsed alcoholic (HCC) 09/14/2022   Alcohol withdrawal syndrome (HCC) 02/25/2022   Seizure due to alcohol withdrawal (HCC) 02/25/2022   Substance abuse (HCC) 02/25/2022   Thrombocytopenia (HCC) 02/25/2022   Alcohol withdrawal (HCC) 02/25/2022   Subclinical hyperthyroidism 10/27/2021   Schizoaffective disorder (HCC) 12/26/2020   Cesarean delivery, delivered, current hospitalization 12/26/2020   Encounter for induction of labor 12/26/2020   H/O anxiety disorder 12/26/2020   Acute blood loss anemia 12/26/2020   IDA (iron deficiency anemia) 12/25/2020   IUGR (intrauterine growth restriction) affecting care of mother 12/24/2020   Cannabis use without complication 12/24/2020   Postpartum care following cesarean delivery 12/24/2020   Major  depressive disorder, recurrent episode (HCC) 09/10/2015   Cyclical vomiting syndrome 02/10/2015    Past Surgical History:  Procedure Laterality Date   CESAREAN SECTION N/A 12/24/2020   Procedure: CESAREAN SECTION;  Surgeon: Hoover Browns, MD;  Location: [facility];  Service: Obstetrics;  Laterality: N/A;   TONSILLECTOMY      OB History     Gravida  2   Para  2   Term  2   Preterm  0   AB  0   Living  2      SAB  0   IAB  0   Ectopic  0   Multiple  0   Live Births  2            Home Medications    Prior to Admission medications   Medication Sig Start Date End Date Taking? Authorizing Provider  ARIPiprazole (ABILIFY) 5 MG tablet Take 1 tablet (5 mg total) by mouth daily. 10/11/22  Yes Mayers, Cari S, PA-C  hydrOXYzine (ATARAX) 25 MG tablet Take 25 mg by mouth 3 (three) times daily as needed.   Yes [provider]  sertraline (ZOLOFT) 50 MG tablet Take 1 tablet (50 mg total) by mouth daily. 10/11/22  Yes Mayers, Cari S, PA-C  ondansetron (ZOFRAN-ODT) 4 MG disintegrating tablet Take 1 tablet (4 mg total) by mouth every 8 (eight) hours as needed for nausea or vomiting. 09/16/23   Zenia Resides, MD    Family History Family History  Problem Relation  Age of Onset   Hypertension Mother    Seizures Maternal Uncle    Breast cancer Paternal Aunt    Kidney disease Maternal Grandmother     Social History Social History   Tobacco Use   Smoking status: Every Day    Current packs/day: 0.50    Types: Cigarettes   Smokeless tobacco: Never  Vaping Use   Vaping status: Never Used  Substance Use Topics   Alcohol use: Yes    Alcohol/week: 14.0 standard drinks of alcohol    Types: 14 Standard drinks or equivalent per week   Drug use: Yes    Types: Marijuana     Allergies   Patient has no known allergies.   Review of Systems Review of Systems  Cardiovascular:  Positive for chest pain.  Gastrointestinal:  Positive for vomiting.     Physical  Exam Triage Vital Signs ED Triage Vitals  Encounter Vitals Group     BP 09/16/23 1315 (!) 137/96     Systolic BP Percentile --      Diastolic BP Percentile --      Pulse Rate 09/16/23 1315 (!) 116     Resp 09/16/23 1315 18     Temp 09/16/23 1315 97.9 F (36.6 C)     Temp Source 09/16/23 1315 Oral     SpO2 09/16/23 1315 98 %     Weight --      Height --      Head Circumference --      Peak Flow --      Pain Score 09/16/23 1314 10     Pain Loc --      Pain Education --      Exclude from Growth Chart --    No data found.  Updated Vital Signs BP (!) 137/96 (BP Location: Right Arm)   Pulse (!) 116   Temp 97.9 F (36.6 C) (Oral)   Resp 18   LMP 09/15/2023 (Exact Date)   SpO2 98%   Visual Acuity Right Eye Distance:   Left Eye Distance:   Bilateral Distance:    Right Eye Near:   Left Eye Near:    Bilateral Near:     Physical Exam Vitals reviewed.  Constitutional:      Appearance: She is not toxic-appearing or diaphoretic.     Comments: In obvious discomfort with pained facies  HENT:     Right Ear: Tympanic membrane normal.     Nose: Nose normal.     Mouth/Throat:     Mouth: Mucous membranes are moist.  Eyes:     Extraocular Movements: Extraocular movements intact.     Conjunctiva/sclera: Conjunctivae normal.     Pupils: Pupils are equal, round, and reactive to light.  Cardiovascular:     Rate and Rhythm: Normal rate and regular rhythm.     Heart sounds: No murmur heard. Pulmonary:     Effort: Pulmonary effort is normal. No respiratory distress.     Breath sounds: No stridor. No wheezing, rhonchi or rales.  Abdominal:     General: Bowel sounds are normal. There is no distension.     Tenderness: There is abdominal tenderness. There is guarding.     Comments: There is generalized tenderness with muscle spasms and possible guarding.  Musculoskeletal:     Cervical back: Neck supple.  Lymphadenopathy:     Cervical: No cervical adenopathy.  Skin:    Capillary  Refill: Capillary refill takes less than 2 seconds.     Coloration:  Skin is not jaundiced or pale.  Neurological:     General: No focal deficit present.     Mental Status: She is alert and oriented to person, place, and time.  Psychiatric:        Behavior: Behavior normal.      UC Treatments / Results  Labs (all labs ordered are listed, but only abnormal results are displayed) Labs Reviewed - No data to display  EKG   Radiology No results found.  Procedures Procedures (including critical care time)  Medications Ordered in UC Medications  ketorolac (TORADOL) 30 MG/ML injection 30 mg (has no administration in time range)  ondansetron (ZOFRAN) injection 4 mg (4 mg Intramuscular Given 09/16/23 1321)    Initial Impression / Assessment and Plan / UC Course  I have reviewed the triage vital signs and the nursing notes.  Pertinent labs & imaging results that were available during my care of the patient were reviewed by me and considered in my medical decision making (see chart for details).    {The patient has been seen in Urgent Care in the last 3 years. :1 Gave her 1 dose of Zofran IM here in the office that she was actively vomiting when getting triage.  I wanted her to go to the ER for further evaluation but she adamantly declines stating that they always put her in the waiting room.  Toradol injections given here.  She does not improve she is to proceed to the emergency room for further evaluation and treatment. Final Clinical Impressions(s) / UC Diagnoses   Final diagnoses:  Nausea and vomiting, unspecified vomiting type  Generalized abdominal pain     Discharge Instructions      You have been given a shot of Toradol 30 mg and 4 mg ondansetron today.  Ondansetron dissolved in the mouth every 8 hours as needed for nausea or vomiting. Clear liquids(water, gatorade/pedialyte, ginger ale/sprite, chicken broth/soup) and bland things(crackers/toast, rice, potato,  bananas) to eat. Avoid acidic foods like lemon/lime/orange/tomato, and avoid greasy/spicy foods.  If your vomiting or pain do not improve or if they worsen, please go to the emergency room for further evaluation we cannot do here in the urgent care clinic       ED Prescriptions     Medication Sig Dispense Auth. Provider   ondansetron (ZOFRAN-ODT) 4 MG disintegrating tablet Take 1 tablet (4 mg total) by mouth every 8 (eight) hours as needed for nausea or vomiting. 10 tablet Marlinda Mike Janace Aris, MD      PDMP not reviewed this encounter.   Zenia Resides, MD 09/16/23 [id number]

## 2023-09-16 NOTE — Discharge Instructions (Signed)
 You have been given a shot of Toradol 30 mg and 4 mg ondansetron today.  Ondansetron dissolved in the mouth every 8 hours as needed for nausea or vomiting. Clear liquids(water, gatorade/pedialyte, ginger ale/sprite, chicken broth/soup) and bland things(crackers/toast, rice, potato, bananas) to eat. Avoid acidic foods like lemon/lime/orange/tomato, and avoid greasy/spicy foods.  If your vomiting or pain do not improve or if they worsen, please go to the emergency room for further evaluation we cannot do here in the urgent care clinic

## 2023-09-16 NOTE — ED Triage Notes (Signed)
 Chief Complaint: Severe emesis, dizziness and hot flashes. Now having pain in the mid chest, states it feels like a dull tightness. States has not been taking her meds often or eating well.   Sick exposure: No  Onset: emesis this past Tuesday, Chest pain onset today  Prescriptions or OTC medications tried: Yes- Mylanta     with little relief  New foods, medications, or products: No  Recent Travel: No

## 2023-10-18 ENCOUNTER — Encounter (HOSPITAL_COMMUNITY): Payer: Self-pay

## 2023-10-18 ENCOUNTER — Ambulatory Visit (HOSPITAL_COMMUNITY)
Admission: RE | Admit: 2023-10-18 | Discharge: 2023-10-18 | Disposition: A | Discharge status: Home / Self Care | Payer: MEDICAID | Source: Ambulatory Visit | Attending: Family Medicine | Admitting: Family Medicine

## 2023-10-18 VITALS — BP 118/80 | HR 99 | Temp 97.9°F | Resp 14

## 2023-10-18 DIAGNOSIS — R1115 Cyclical vomiting syndrome unrelated to migraine: Secondary | ICD-10-CM | POA: Diagnosis not present

## 2023-10-18 DIAGNOSIS — R1030 Lower abdominal pain, unspecified: Secondary | ICD-10-CM | POA: Diagnosis present

## 2023-10-18 DIAGNOSIS — Z3202 Encounter for pregnancy test, result negative: Secondary | ICD-10-CM

## 2023-10-18 DIAGNOSIS — N898 Other specified noninflammatory disorders of vagina: Secondary | ICD-10-CM

## 2023-10-18 LAB — POCT URINALYSIS DIP (MANUAL ENTRY)
Glucose, UA: NEGATIVE mg/dL
Leukocytes, UA: NEGATIVE
Nitrite, UA: NEGATIVE
Protein Ur, POC: 100 mg/dL — AB
Spec Grav, UA: >=1.03 — AB (ref 1.010–1.025)
Urobilinogen, UA: 0.2 U/dL
pH, UA: 6 (ref 5.0–8.0)

## 2023-10-18 LAB — HIV ANTIBODY (ROUTINE TESTING W REFLEX): HIV Screen 4th Generation wRfx: NONREACTIVE

## 2023-10-18 LAB — POCT URINE PREGNANCY: Preg Test, Ur: NEGATIVE

## 2023-10-18 MED ORDER — KETOROLAC TROMETHAMINE 30 MG/ML IJ SOLN
INTRAMUSCULAR | Status: AC
  Filled 2023-10-18: qty 1

## 2023-10-18 MED ORDER — KETOROLAC TROMETHAMINE 30 MG/ML IJ SOLN
30.0000 mg | Freq: Once | INTRAMUSCULAR | Status: AC
  Administered 2023-10-18: 30 mg via INTRAMUSCULAR

## 2023-10-18 MED ORDER — ONDANSETRON HCL 4 MG/2ML IJ SOLN
INTRAMUSCULAR | Status: AC
  Filled 2023-10-18: qty 2

## 2023-10-18 MED ORDER — ONDANSETRON HCL 4 MG/2ML IJ SOLN
4.0000 mg | Freq: Once | INTRAMUSCULAR | Status: AC
  Administered 2023-10-18: 4 mg via INTRAMUSCULAR

## 2023-10-18 NOTE — ED Triage Notes (Signed)
 Patient reports that she has been having lower abdominal cramping x 8 days. Patient states her periods is late. Patient also c/o vaginal itching and irritation, and a clear vaginal discharge x 2 days.  Patient also reports N/v for approx 7-8 days, but states she has had N/V due to "[facility]"  Patient is also requesting a work note.

## 2023-10-18 NOTE — ED Provider Notes (Signed)
 [facility]    CSN: [id number] Arrival date & time: 10/18/23  1330      History   Chief Complaint Chief Complaint  Patient presents with   Abdominal Pain    Entered by patient   Emesis    HPI Angela Christian is a 28 y.o. female.   HPI  She reports she is having some abdominal cramping and stomach discomfort She states she typically feels like this and has N/V around her period. She states this started last week but her period did not start She reports she is able to keep fluids down but does have some nausea with water intake   She reports loose stools but states it is sometimes hard to have a bowel movement She does report some vaginal  irritation with sexual intercourse  She states she has some irritation with urination but denies frank pain     Past Medical History:  Diagnosis Date   Anxiety    Asthma    Bipolar disease, manic (HCC)    Cyclical vomiting syndrome    Depression    Dyspnea    Headache    PTSD (post-traumatic stress disorder)    Recurrent upper respiratory infection (URI)    Schizophrenia (HCC)     Patient Active Problem List   Diagnosis Date Noted   GAD (generalized anxiety disorder) 10/12/2022   Bipolar affective disorder in remission (HCC) 10/12/2022   Alcohol abuse 10/12/2022   Seasonal allergies 10/12/2022   Macrocytic anemia 10/12/2022   Polysubstance abuse (HCC) 09/14/2022   Substance induced mood disorder (HCC) 09/14/2022   Alcohol intoxication in relapsed alcoholic (HCC) 09/14/2022   Alcohol withdrawal syndrome (HCC) 02/25/2022   Seizure due to alcohol withdrawal (HCC) 02/25/2022   Substance abuse (HCC) 02/25/2022   Thrombocytopenia (HCC) 02/25/2022   Alcohol withdrawal (HCC) 02/25/2022   Subclinical hyperthyroidism 10/27/2021   Schizoaffective disorder (HCC) 12/26/2020   Cesarean delivery, delivered, current hospitalization 12/26/2020   Encounter for induction of labor 12/26/2020   H/O anxiety disorder 12/26/2020    Acute blood loss anemia 12/26/2020   IDA (iron deficiency anemia) 12/25/2020   IUGR (intrauterine growth restriction) affecting care of mother 12/24/2020   Cannabis use without complication 12/24/2020   Postpartum care following cesarean delivery 12/24/2020   Major depressive disorder, recurrent episode (HCC) 09/10/2015   Cyclical vomiting syndrome 02/10/2015    Past Surgical History:  Procedure Laterality Date   CESAREAN SECTION N/A 12/24/2020   Procedure: CESAREAN SECTION;  Surgeon: Hoover Browns, MD;  Location: [facility];  Service: Obstetrics;  Laterality: N/A;   TONSILLECTOMY      OB History     Gravida  2   Para  2   Term  2   Preterm  0   AB  0   Living  2      SAB  0   IAB  0   Ectopic  0   Multiple  0   Live Births  2            Home Medications    Prior to Admission medications   Medication Sig Start Date End Date Taking? Authorizing Provider  ARIPiprazole (ABILIFY) 5 MG tablet Take 1 tablet (5 mg total) by mouth daily. 10/11/22   Mayers, Cari S, PA-C  hydrOXYzine (ATARAX) 25 MG tablet Take 25 mg by mouth 3 (three) times daily as needed.    [provider]  ondansetron (ZOFRAN-ODT) 4 MG disintegrating tablet Take 1 tablet (4  mg total) by mouth every 8 (eight) hours as needed for nausea or vomiting. 09/16/23   Zenia Resides, MD  sertraline (ZOLOFT) 50 MG tablet Take 1 tablet (50 mg total) by mouth daily. 10/11/22   Mayers, Kasandra Knudsen, PA-C    Family History Family History  Problem Relation Age of Onset   Hypertension Mother    Seizures Maternal Uncle    Breast cancer Paternal Aunt    Kidney disease Maternal Grandmother     Social History Social History   Tobacco Use   Smoking status: Every Day    Current packs/day: 0.50    Types: Cigarettes   Smokeless tobacco: Never  Vaping Use   Vaping status: Never Used  Substance Use Topics   Alcohol use: Yes    Alcohol/week: 14.0 standard drinks of alcohol    Types: 14 Standard drinks  or equivalent per week   Drug use: Yes    Types: Marijuana     Allergies   Patient has no known allergies.   Review of Systems Review of Systems  Constitutional:  Positive for chills. Negative for fever.  Gastrointestinal:  Positive for abdominal pain, diarrhea, nausea and vomiting. Negative for blood in stool.  Genitourinary:  Positive for vaginal discharge. Negative for decreased urine volume, difficulty urinating, dysuria, flank pain, frequency, genital sores, vaginal bleeding and vaginal pain.  Skin:  Negative for rash.     Physical Exam Triage Vital Signs ED Triage Vitals  Encounter Vitals Group     BP 10/18/23 1343 118/80     Systolic BP Percentile --      Diastolic BP Percentile --      Pulse Rate 10/18/23 1343 99     Resp 10/18/23 1343 14     Temp 10/18/23 1343 97.9 F (36.6 C)     Temp Source 10/18/23 1343 Oral     SpO2 10/18/23 1343 98 %     Weight --      Height --      Head Circumference --      Peak Flow --      Pain Score 10/18/23 1342 5     Pain Loc --      Pain Education --      Exclude from Growth Chart --    No data found.  Updated Vital Signs BP 118/80 (BP Location: Right Arm)   Pulse 99   Temp 97.9 F (36.6 C) (Oral)   Resp 14   LMP 09/15/2023   SpO2 98%   Visual Acuity Right Eye Distance:   Left Eye Distance:   Bilateral Distance:    Right Eye Near:   Left Eye Near:    Bilateral Near:     Physical Exam Vitals reviewed.  Constitutional:      General: She is awake.     Appearance: Normal appearance. She is well-developed and well-groomed.  HENT:     Head: Normocephalic and atraumatic.  Eyes:     General: Lids are normal. Gaze aligned appropriately.     Extraocular Movements: Extraocular movements intact.     Conjunctiva/sclera: Conjunctivae normal.  Pulmonary:     Effort: Pulmonary effort is normal.  Neurological:     General: No focal deficit present.     Mental Status: She is alert and oriented to person, place, and  time.     GCS: GCS eye subscore is 4. GCS verbal subscore is 5. GCS motor subscore is 6.     Cranial Nerves: No cranial  nerve deficit, dysarthria or facial asymmetry.  Psychiatric:        Attention and Perception: Attention and perception normal.        Mood and Affect: Mood and affect normal.        Speech: Speech normal.        Behavior: Behavior normal. Behavior is cooperative.      UC Treatments / Results  Labs (all labs ordered are listed, but only abnormal results are displayed) Labs Reviewed  POCT URINALYSIS DIP (MANUAL ENTRY) - Abnormal; Notable for the following components:      Result Value   Color, UA orange (*)    Clarity, UA cloudy (*)    Bilirubin, UA small (*)    Ketones, POC UA small (15) (*)    Spec Grav, UA >=1.030 (*)    Blood, UA moderate (*)    Protein Ur, POC =100 (*)    All other components within normal limits  URINE CULTURE  HIV ANTIBODY (ROUTINE TESTING W REFLEX)  RPR  POCT URINE PREGNANCY  CERVICOVAGINAL ANCILLARY ONLY    EKG   Radiology No results found.  Procedures Procedures (including critical care time)  Medications Ordered in UC Medications  ondansetron (ZOFRAN) injection 4 mg (4 mg Intramuscular Given 10/18/23 1421)  ketorolac (TORADOL) 30 MG/ML injection 30 mg (30 mg Intramuscular Given 10/18/23 1421)    Initial Impression / Assessment and Plan / UC Course  I have reviewed the triage vital signs and the nursing notes.  Pertinent labs & imaging results that were available during my care of the patient were reviewed by me and considered in my medical decision making (see chart for details).      Final Clinical Impressions(s) / UC Diagnoses   Final diagnoses:  Lower abdominal pain  Cyclical vomiting syndrome not associated with migraine  Vaginal discharge   Patient presents today with concerns for suprapubic abdominal pain along with some nausea and vomiting.  She reports that she typically has nausea and vomiting close to  her.  Dates but she has not had her.  At her typical/expected time.  She does report some clear vaginal discharge as well as vaginal itching and irritation.  Urine dip was negative for leukocytes or nitrites but positive for moderate amount of blood.  Reviewed that this could be due to contamination or irritation in genital area.  Will get urine culture for definitive rule out of UTI.  Will get cervicovaginal swab for gonorrhea, chlamydia, trichomonas, yeast, BV rule out.  Patient would also like HIV and RPR testing.  Urine pregnancy test was negative.  Results of urine dip and urine pregnancy test were reviewed with patient during her appointment.  Will provide Zofran 4 mg injection as well as Toradol injection to assist with pain per patient request.  Recommend increasing liquid intake to help prevent dehydration and small portions of bland diet until normal diet as tolerated.  Results of further testing to dictate further management.  ED and return precautions reviewed and provided in after visit summary.  Recommend refraining from sexual activity until test results are negative or she is completed appropriate medication regimen as indicated by results.  Recommend using a condom or an appropriate barrier method to help prevent STD transmission.  Patient voiced agreement and understanding to recommendations and proposed management plan.  Follow-up as needed     Discharge Instructions      You were seen today for concerns for abdominal pain along with some nausea and vomiting.  We have administered a shot of Zofran 4 mg as well as Toradol 30 mg per your request. At this time I recommend staying well-hydrated and incorporating small portions of a bland diet as tolerated until you are feeling better. Your urine dip did not show signs of an active UTI but I have sent it off for culture just to definitively rule this out.  Your urine pregnancy test was negative.  We have also collected a cervicovaginal swab  to check for BV, yeast, trichomonas, gonorrhea, chlamydia.  We completed blood work to assess for syphilis and HIV . we will keep you updated with those results once they are available. At this time I recommend refraining from sexual activity until your test results are negative or you have completed an appropriate medication regimen.  Please use a condom or another barrier method to help prevent STD transmission. If your abdominal pain becomes worse, is associated with fever, chills, if you start to develop significant nausea and vomiting that prevents you from eating or drinking and you start to get dehydrated, develop severe diarrhea or blood in the stool please go to the emergency room for further evaluation and management as these could  be signs of a medical emergency.     ED Prescriptions   None    PDMP not reviewed this encounter.   Providence Crosby, PA-C 10/18/23 2040

## 2023-10-18 NOTE — Discharge Instructions (Addendum)
 You were seen today for concerns for abdominal pain along with some nausea and vomiting.  We have administered a shot of Zofran 4 mg as well as Toradol 30 mg per your request. At this time I recommend staying well-hydrated and incorporating small portions of a bland diet as tolerated until you are feeling better. Your urine dip did not show signs of an active UTI but I have sent it off for culture just to definitively rule this out.  Your urine pregnancy test was negative.  We have also collected a cervicovaginal swab to check for BV, yeast, trichomonas, gonorrhea, chlamydia.  We completed blood work to assess for syphilis and HIV . we will keep you updated with those results once they are available. At this time I recommend refraining from sexual activity until your test results are negative or you have completed an appropriate medication regimen.  Please use a condom or another barrier method to help prevent STD transmission. If your abdominal pain becomes worse, is associated with fever, chills, if you start to develop significant nausea and vomiting that prevents you from eating or drinking and you start to get dehydrated, develop severe diarrhea or blood in the stool please go to the emergency room for further evaluation and management as these could  be signs of a medical emergency.

## 2023-10-19 ENCOUNTER — Telehealth (HOSPITAL_COMMUNITY): Payer: Self-pay

## 2023-10-19 LAB — CERVICOVAGINAL ANCILLARY ONLY
Bacterial Vaginitis (gardnerella): POSITIVE — AB
Candida Glabrata: NEGATIVE
Candida Vaginitis: NEGATIVE
Chlamydia: NEGATIVE
Comment: NEGATIVE
Comment: NEGATIVE
Comment: NEGATIVE
Comment: NEGATIVE
Comment: NEGATIVE
Comment: NORMAL
Neisseria Gonorrhea: NEGATIVE
Trichomonas: NEGATIVE

## 2023-10-19 LAB — RPR: RPR Ser Ql: NONREACTIVE

## 2023-10-19 LAB — URINE CULTURE: Culture: NO GROWTH

## 2023-10-19 MED ORDER — METRONIDAZOLE 500 MG PO TABS
500.0000 mg | ORAL_TABLET | Freq: Two times a day (BID) | ORAL | 0 refills | Status: AC
Start: 2023-10-19 — End: 2023-10-26

## 2023-10-19 NOTE — Telephone Encounter (Signed)
 Per protocol, pt requires tx with metronidazole. Rx sent to pharmacy on file.

## 2023-11-10 ENCOUNTER — Other Ambulatory Visit: Payer: Self-pay

## 2023-11-10 ENCOUNTER — Emergency Department (HOSPITAL_COMMUNITY)
Admission: EM | Admit: 2023-11-10 | Discharge: 2023-11-10 | Disposition: A | Discharge status: Home / Self Care | Payer: MEDICAID | Attending: Emergency Medicine | Admitting: Emergency Medicine

## 2023-11-10 ENCOUNTER — Encounter (HOSPITAL_COMMUNITY): Payer: Self-pay | Admitting: *Deleted

## 2023-11-10 ENCOUNTER — Ambulatory Visit (HOSPITAL_COMMUNITY): Payer: Self-pay

## 2023-11-10 DIAGNOSIS — R1115 Cyclical vomiting syndrome unrelated to migraine: Secondary | ICD-10-CM | POA: Diagnosis not present

## 2023-11-10 DIAGNOSIS — R109 Unspecified abdominal pain: Secondary | ICD-10-CM | POA: Insufficient documentation

## 2023-11-10 DIAGNOSIS — R112 Nausea with vomiting, unspecified: Secondary | ICD-10-CM | POA: Diagnosis present

## 2023-11-10 LAB — COMPREHENSIVE METABOLIC PANEL WITH GFR
ALT: 20 U/L (ref 0–44)
AST: 26 U/L (ref 15–41)
Albumin: 4.5 g/dL (ref 3.5–5.0)
Alkaline Phosphatase: 72 U/L (ref 38–126)
Anion gap: 13 (ref 5–15)
BUN: 9 mg/dL (ref 6–20)
CO2: 26 mmol/L (ref 22–32)
Calcium: 10.6 mg/dL — ABNORMAL HIGH (ref 8.9–10.3)
Chloride: 99 mmol/L (ref 98–111)
Creatinine, Ser: 0.82 mg/dL (ref 0.44–1.00)
GFR, Estimated: >60 mL/min (ref >60)
Glucose, Bld: 114 mg/dL — ABNORMAL HIGH (ref 70–99)
Potassium: 3.3 mmol/L — ABNORMAL LOW (ref 3.5–5.1)
Sodium: 138 mmol/L (ref 135–145)
Total Bilirubin: 1.1 mg/dL (ref 0.0–1.2)
Total Protein: 8.3 g/dL — ABNORMAL HIGH (ref 6.5–8.1)

## 2023-11-10 LAB — CBC
HCT: 41.4 % (ref 36.0–46.0)
Hemoglobin: 14.4 g/dL (ref 12.0–15.0)
MCH: 34.8 pg — ABNORMAL HIGH (ref 26.0–34.0)
MCHC: 34.8 g/dL (ref 30.0–36.0)
MCV: 100 fL (ref 80.0–100.0)
Platelets: 307 10*3/uL (ref 150–400)
RBC: 4.14 MIL/uL (ref 3.87–5.11)
RDW: 15 % (ref 11.5–15.5)
WBC: 6.4 10*3/uL (ref 4.0–10.5)
nRBC: 0 % (ref 0.0–0.2)

## 2023-11-10 LAB — LIPASE, BLOOD: Lipase: 23 U/L (ref 11–51)

## 2023-11-10 LAB — HCG, SERUM, QUALITATIVE: Preg, Serum: NEGATIVE

## 2023-11-10 LAB — ETHANOL: Alcohol, Ethyl (B): <15 mg/dL (ref <15)

## 2023-11-10 MED ORDER — METOCLOPRAMIDE HCL 5 MG/ML IJ SOLN
10.0000 mg | Freq: Once | INTRAMUSCULAR | Status: AC
  Administered 2023-11-10: 10 mg via INTRAVENOUS
  Filled 2023-11-10: qty 2

## 2023-11-10 MED ORDER — SODIUM CHLORIDE 0.9 % IV SOLN
12.5000 mg | Freq: Once | INTRAVENOUS | Status: AC
  Administered 2023-11-10: 12.5 mg via INTRAVENOUS
  Filled 2023-11-10: qty 12.5

## 2023-11-10 MED ORDER — PANTOPRAZOLE SODIUM 40 MG IV SOLR
40.0000 mg | Freq: Once | INTRAVENOUS | Status: AC
  Administered 2023-11-10: 40 mg via INTRAVENOUS
  Filled 2023-11-10: qty 10

## 2023-11-10 MED ORDER — LORAZEPAM 2 MG/ML IJ SOLN
1.0000 mg | Freq: Once | INTRAMUSCULAR | Status: AC
  Administered 2023-11-10: 1 mg via INTRAVENOUS
  Filled 2023-11-10: qty 1

## 2023-11-10 MED ORDER — METOCLOPRAMIDE HCL 10 MG PO TABS: 10.0000 mg | ORAL_TABLET | Freq: Four times a day (QID) | ORAL | 0 refills | Status: AC

## 2023-11-10 MED ORDER — SODIUM CHLORIDE 0.9 % IV BOLUS
1000.0000 mL | Freq: Once | INTRAVENOUS | Status: AC
  Administered 2023-11-10: 1000 mL via INTRAVENOUS

## 2023-11-10 MED ORDER — ONDANSETRON HCL 4 MG/2ML IJ SOLN
4.0000 mg | Freq: Once | INTRAMUSCULAR | Status: AC
  Administered 2023-11-10: 4 mg via INTRAVENOUS
  Filled 2023-11-10: qty 2

## 2023-11-10 NOTE — ED Provider Notes (Signed)
 [facility] AT [facility] Provider Note   CSN: [id number] Arrival date & time: 11/10/23  0447     History  Chief Complaint  Patient presents with   Emesis    Angela Christian is a 28 y.o. female.  Patient to ED with nausea, vomiting and abdominal pain that started earlier in the evening, c/w history of cyclic vomiting. No fever. She reports using marijuana yesterday morning. No sick contacts. Has Zofran  at home that did not control her symptoms. No hematemesis.   The history is provided by the patient. No language interpreter was used.  Emesis      Home Medications Prior to Admission medications   Medication Sig Start Date End Date Taking? Authorizing Provider  ARIPiprazole  (ABILIFY ) 5 MG tablet Take 1 tablet (5 mg total) by mouth daily. 10/11/22   Mayers, Cari S, PA-C  hydrOXYzine  (ATARAX ) 25 MG tablet Take 25 mg by mouth 3 (three) times daily as needed.    [provider]  ondansetron  (ZOFRAN -ODT) 4 MG disintegrating tablet Take 1 tablet (4 mg total) by mouth every 8 (eight) hours as needed for nausea or vomiting. 09/16/23   Ann Keto, MD  sertraline  (ZOLOFT ) 50 MG tablet Take 1 tablet (50 mg total) by mouth daily. 10/11/22   Mayers, Cari S, PA-C      Allergies    Patient has no known allergies.    Review of Systems   Review of Systems  Gastrointestinal:  Positive for vomiting.    Physical Exam Updated Vital Signs BP 139/83 (BP Location: Right Arm)   Pulse 97   Temp 97.9 F (36.6 C) (Oral)   Resp 20   LMP 09/15/2023   SpO2 100%  Physical Exam Constitutional:      Appearance: She is well-developed.     Comments: Uncomfortable appearing.   HENT:     Head: Normocephalic.  Cardiovascular:     Rate and Rhythm: Normal rate.  Pulmonary:     Effort: Pulmonary effort is normal.  Abdominal:     General: There is no distension.     Palpations: Abdomen is soft.     Tenderness: There is no abdominal tenderness.   Musculoskeletal:        General: Normal range of motion.     Cervical back: Normal range of motion and neck supple.  Skin:    General: Skin is warm and dry.  Neurological:     General: No focal deficit present.     Mental Status: She is alert and oriented to person, place, and time.     ED Results / Procedures / Treatments   Labs (all labs ordered are listed, but only abnormal results are displayed) Labs Reviewed  COMPREHENSIVE METABOLIC PANEL WITH GFR - Abnormal; Notable for the following components:      Result Value   Potassium 3.3 (*)    Glucose, Bld 114 (*)    Calcium  10.6 (*)    Total Protein 8.3 (*)    All other components within normal limits  CBC - Abnormal; Notable for the following components:   MCH 34.8 (*)    All other components within normal limits  LIPASE, BLOOD  HCG, SERUM, QUALITATIVE  ETHANOL  URINALYSIS, ROUTINE W REFLEX MICROSCOPIC  RAPID URINE DRUG SCREEN, HOSP PERFORMED   Results for orders placed or performed during the hospital encounter of 11/10/23  Lipase, blood   Collection Time: 11/10/23  5:04 AM  Result Value Ref Range  Lipase 23 11 - 51 U/L  Comprehensive metabolic panel   Collection Time: 11/10/23  5:04 AM  Result Value Ref Range   Sodium 138 135 - 145 mmol/L   Potassium 3.3 (L) 3.5 - 5.1 mmol/L   Chloride 99 98 - 111 mmol/L   CO2 26 22 - 32 mmol/L   Glucose, Bld 114 (H) 70 - 99 mg/dL   BUN 9 6 - 20 mg/dL   Creatinine, Ser 1.61 0.44 - 1.00 mg/dL   Calcium  10.6 (H) 8.9 - 10.3 mg/dL   Total Protein 8.3 (H) 6.5 - 8.1 g/dL   Albumin 4.5 3.5 - 5.0 g/dL   AST 26 15 - 41 U/L   ALT 20 0 - 44 U/L   Alkaline Phosphatase 72 38 - 126 U/L   Total Bilirubin 1.1 0.0 - 1.2 mg/dL   GFR, Estimated >09 >60 mL/min   Anion gap 13 5 - 15  CBC   Collection Time: 11/10/23  5:04 AM  Result Value Ref Range   WBC 6.4 4.0 - 10.5 K/uL   RBC 4.14 3.87 - 5.11 MIL/uL   Hemoglobin 14.4 12.0 - 15.0 g/dL   HCT 45.4 09.8 - 11.9 %   MCV 100.0 80.0 - 100.0  fL   MCH 34.8 (H) 26.0 - 34.0 pg   MCHC 34.8 30.0 - 36.0 g/dL   RDW 14.7 82.9 - 56.2 %   Platelets 307 150 - 400 K/uL   nRBC 0.0 0.0 - 0.2 %  hCG, serum, qualitative   Collection Time: 11/10/23  5:04 AM  Result Value Ref Range   Preg, Serum NEGATIVE NEGATIVE  Ethanol   Collection Time: 11/10/23  5:04 AM  Result Value Ref Range   Alcohol, Ethyl (B) <15 <15 mg/dL     EKG None  Radiology No results found.  Procedures Procedures    Medications Ordered in ED Medications  sodium chloride  0.9 % bolus 1,000 mL (1,000 mLs Intravenous New Bag/Given 11/10/23 0522)  ondansetron  (ZOFRAN ) injection 4 mg (4 mg Intravenous Given 11/10/23 0526)  metoCLOPramide  (REGLAN ) injection 10 mg (10 mg Intravenous Given 11/10/23 0526)  pantoprazole  (PROTONIX ) injection 40 mg (40 mg Intravenous Given 11/10/23 0530)  LORazepam  (ATIVAN ) injection 1 mg (1 mg Intravenous Given 11/10/23 0537)    ED Course/ Medical Decision Making/ A&P Clinical Course as of 11/10/23 1308  Thu Nov 10, 2023  0604 Patient to ED With N, V and abdominal pain (generalized). No fever. History of cyclic vomiting which she states feels the same. No fever. Labs reviewed: pregnancy negative. No leukocytosis, normal hgb. K+ 3.2 but otherwise no significant electrolyte abnormalities. Normal renal function. ETOH <15. Zofran , Reglan  and Protonix  provided IV along with IV fluids. She is feeling better after medications but is restless, requesting something for anxiety. Ativan  provided with good result. Patient care signed out at end of shift to oncoming provider. Plan: reassess symptoms after fluids, PO challenge. Anticipate discharge home.  [SU]    Clinical Course User Index [SU] Mandy Second, PA-C                                 Medical Decision Making Amount and/or Complexity of Data Reviewed Labs: ordered.  Risk Prescription drug management.           Final Clinical Impression(s) / ED Diagnoses Final diagnoses:   Cyclical vomiting syndrome not associated with migraine    Rx / DC Orders ED Discharge  Orders     None         Mandy Second, PA-C 11/10/23 1610    Alissa April, MD 11/10/23 [phone]

## 2023-11-10 NOTE — ED Triage Notes (Signed)
 Pt arrives via [facility], per report vomiting x 6 hours, hx of cyclic vomiting. Took zofran  at home without relief. VSS. 126/82, hr 90, 100% cbg 114.

## 2023-11-10 NOTE — Discharge Instructions (Addendum)
 Follow up with your doctor as needed if symptoms persist or recur. A prescription for Reglan  was sent to your pharmacy to help with better control of recurrent nausea/vomiting at home. Push fluids at home in small, frequent amounts to avoid dehydration.   Return to the ED with any new or worsening symptoms.

## 2023-11-10 NOTE — ED Notes (Signed)
 Pt tolerates PO sips of water  without any vomiting.
